# Patient Record
Sex: Female | Born: 1960 | Race: Black or African American | Hispanic: No | Marital: Married | Smoking: Former smoker
Health system: Southern US, Community
[De-identification: ages and names within clinical notes are randomized; demographics above are authoritative.]

## PROBLEM LIST (undated history)

## (undated) DIAGNOSIS — F32A Depression, unspecified: Secondary | ICD-10-CM

## (undated) DIAGNOSIS — M1990 Unspecified osteoarthritis, unspecified site: Secondary | ICD-10-CM

## (undated) DIAGNOSIS — R5383 Other fatigue: Secondary | ICD-10-CM

## (undated) DIAGNOSIS — T8859XA Other complications of anesthesia, initial encounter: Secondary | ICD-10-CM

## (undated) DIAGNOSIS — U099 Post covid-19 condition, unspecified: Secondary | ICD-10-CM

## (undated) DIAGNOSIS — G4730 Sleep apnea, unspecified: Secondary | ICD-10-CM

## (undated) DIAGNOSIS — Z8619 Personal history of other infectious and parasitic diseases: Secondary | ICD-10-CM

## (undated) DIAGNOSIS — Z87898 Personal history of other specified conditions: Secondary | ICD-10-CM

## (undated) DIAGNOSIS — G4710 Hypersomnia, unspecified: Secondary | ICD-10-CM

## (undated) DIAGNOSIS — T4145XA Adverse effect of unspecified anesthetic, initial encounter: Secondary | ICD-10-CM

## (undated) DIAGNOSIS — R079 Chest pain, unspecified: Secondary | ICD-10-CM

## (undated) DIAGNOSIS — M722 Plantar fascial fibromatosis: Secondary | ICD-10-CM

## (undated) DIAGNOSIS — B009 Herpesviral infection, unspecified: Secondary | ICD-10-CM

## (undated) DIAGNOSIS — K219 Gastro-esophageal reflux disease without esophagitis: Secondary | ICD-10-CM

## (undated) DIAGNOSIS — G43909 Migraine, unspecified, not intractable, without status migrainosus: Secondary | ICD-10-CM

## (undated) DIAGNOSIS — R112 Nausea with vomiting, unspecified: Secondary | ICD-10-CM

## (undated) DIAGNOSIS — Z8742 Personal history of other diseases of the female genital tract: Secondary | ICD-10-CM

## (undated) DIAGNOSIS — I10 Essential (primary) hypertension: Secondary | ICD-10-CM

## (undated) DIAGNOSIS — F419 Anxiety disorder, unspecified: Secondary | ICD-10-CM

## (undated) DIAGNOSIS — Z9889 Other specified postprocedural states: Secondary | ICD-10-CM

## (undated) DIAGNOSIS — Z72820 Sleep deprivation: Secondary | ICD-10-CM

## (undated) DIAGNOSIS — E119 Type 2 diabetes mellitus without complications: Secondary | ICD-10-CM

## (undated) DIAGNOSIS — N890 Mild vaginal dysplasia: Secondary | ICD-10-CM

## (undated) DIAGNOSIS — E785 Hyperlipidemia, unspecified: Secondary | ICD-10-CM

## (undated) DIAGNOSIS — F329 Major depressive disorder, single episode, unspecified: Secondary | ICD-10-CM

## (undated) DIAGNOSIS — G2589 Other specified extrapyramidal and movement disorders: Secondary | ICD-10-CM

## (undated) DIAGNOSIS — D332 Benign neoplasm of brain, unspecified: Secondary | ICD-10-CM

## (undated) HISTORY — DX: Sleep apnea, unspecified: G47.30

## (undated) HISTORY — DX: Herpesviral infection, unspecified: B00.9

## (undated) HISTORY — PX: DILATION AND CURETTAGE OF UTERUS: SHX78

## (undated) HISTORY — DX: Gastro-esophageal reflux disease without esophagitis: K21.9

## (undated) HISTORY — DX: Plantar fascial fibromatosis: M72.2

## (undated) HISTORY — DX: Personal history of other infectious and parasitic diseases: Z86.19

## (undated) HISTORY — DX: Mild vaginal dysplasia: N89.0

## (undated) HISTORY — DX: Hyperlipidemia, unspecified: E78.5

## (undated) HISTORY — DX: Sleep deprivation: Z72.820

## (undated) HISTORY — DX: Post covid-19 condition, unspecified: U09.9

## (undated) HISTORY — DX: Type 2 diabetes mellitus without complications: E11.9

## (undated) HISTORY — DX: Anxiety disorder, unspecified: F41.9

## (undated) HISTORY — PX: TUBAL LIGATION: SHX77

## (undated) HISTORY — DX: Other fatigue: R53.83

## (undated) HISTORY — DX: Benign neoplasm of brain, unspecified: D33.2

## (undated) HISTORY — DX: Unspecified osteoarthritis, unspecified site: M19.90

## (undated) HISTORY — DX: Other specified extrapyramidal and movement disorders: G25.89

## (undated) HISTORY — DX: Personal history of other diseases of the female genital tract: Z87.42

## (undated) HISTORY — DX: Personal history of other specified conditions: Z87.898

## (undated) HISTORY — DX: Migraine, unspecified, not intractable, without status migrainosus: G43.909

## (undated) HISTORY — PX: ESOPHAGOGASTRODUODENOSCOPY: SHX1529

## (undated) HISTORY — PX: ABDOMINAL HYSTERECTOMY: SHX81

## (undated) HISTORY — PX: COLONOSCOPY: SHX174

## (undated) HISTORY — DX: Chest pain, unspecified: R07.9

## (undated) HISTORY — DX: Hypersomnia, unspecified: G47.10

---
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
## 2000-01-28 ENCOUNTER — Emergency Department (HOSPITAL_COMMUNITY): Admission: EM | Admit: 2000-01-28 | Discharge: 2000-01-28 | Payer: Self-pay | Admitting: Emergency Medicine

## 2000-12-02 ENCOUNTER — Ambulatory Visit: Admission: RE | Admit: 2000-12-02 | Discharge: 2000-12-02 | Payer: Self-pay | Admitting: Family Medicine

## 2001-01-17 ENCOUNTER — Encounter: Payer: Self-pay | Admitting: Family Medicine

## 2001-01-17 ENCOUNTER — Ambulatory Visit (HOSPITAL_COMMUNITY): Admission: RE | Admit: 2001-01-17 | Discharge: 2001-01-17 | Payer: Self-pay | Admitting: Family Medicine

## 2001-07-17 ENCOUNTER — Encounter: Payer: Self-pay | Admitting: Family Medicine

## 2001-07-17 ENCOUNTER — Ambulatory Visit (HOSPITAL_COMMUNITY): Admission: RE | Admit: 2001-07-17 | Discharge: 2001-07-17 | Payer: Self-pay | Admitting: Family Medicine

## 2001-09-03 ENCOUNTER — Encounter: Payer: Self-pay | Admitting: Family Medicine

## 2001-09-03 ENCOUNTER — Ambulatory Visit (HOSPITAL_COMMUNITY): Admission: RE | Admit: 2001-09-03 | Discharge: 2001-09-03 | Payer: Self-pay | Admitting: Family Medicine

## 2002-01-22 ENCOUNTER — Ambulatory Visit (HOSPITAL_COMMUNITY): Admission: RE | Admit: 2002-01-22 | Discharge: 2002-01-22 | Payer: Self-pay | Admitting: Family Medicine

## 2002-01-22 ENCOUNTER — Encounter: Payer: Self-pay | Admitting: General Surgery

## 2002-01-22 ENCOUNTER — Encounter: Payer: Self-pay | Admitting: Family Medicine

## 2002-01-23 ENCOUNTER — Inpatient Hospital Stay (HOSPITAL_COMMUNITY): Admission: EM | Admit: 2002-01-23 | Discharge: 2002-01-24 | Payer: Self-pay | Admitting: Emergency Medicine

## 2002-01-23 ENCOUNTER — Encounter: Payer: Self-pay | Admitting: Family Medicine

## 2002-02-20 ENCOUNTER — Ambulatory Visit (HOSPITAL_COMMUNITY): Admission: RE | Admit: 2002-02-20 | Discharge: 2002-02-20 | Payer: Self-pay | Admitting: Internal Medicine

## 2002-04-09 ENCOUNTER — Ambulatory Visit (HOSPITAL_COMMUNITY): Admission: RE | Admit: 2002-04-09 | Discharge: 2002-04-09 | Payer: Self-pay | Admitting: Family Medicine

## 2002-04-09 ENCOUNTER — Encounter: Payer: Self-pay | Admitting: Family Medicine

## 2002-12-03 ENCOUNTER — Encounter: Payer: Self-pay | Admitting: Family Medicine

## 2002-12-03 ENCOUNTER — Ambulatory Visit (HOSPITAL_COMMUNITY): Admission: RE | Admit: 2002-12-03 | Discharge: 2002-12-03 | Payer: Self-pay | Admitting: Family Medicine

## 2003-04-06 ENCOUNTER — Encounter: Admission: RE | Admit: 2003-04-06 | Discharge: 2003-04-06 | Payer: Self-pay | Admitting: Orthopedic Surgery

## 2005-06-09 ENCOUNTER — Emergency Department (HOSPITAL_COMMUNITY): Admission: EM | Admit: 2005-06-09 | Discharge: 2005-06-09 | Payer: Self-pay | Admitting: Emergency Medicine

## 2006-03-25 ENCOUNTER — Ambulatory Visit (HOSPITAL_COMMUNITY): Admission: RE | Admit: 2006-03-25 | Discharge: 2006-03-25 | Payer: Self-pay | Admitting: Family Medicine

## 2006-06-05 ENCOUNTER — Ambulatory Visit (HOSPITAL_COMMUNITY): Admission: RE | Admit: 2006-06-05 | Discharge: 2006-06-05 | Payer: Self-pay | Admitting: Family Medicine

## 2006-06-07 ENCOUNTER — Ambulatory Visit (HOSPITAL_COMMUNITY): Admission: RE | Admit: 2006-06-07 | Discharge: 2006-06-07 | Payer: Self-pay | Admitting: Family Medicine

## 2006-06-25 ENCOUNTER — Ambulatory Visit: Payer: Self-pay | Admitting: Internal Medicine

## 2006-07-03 ENCOUNTER — Encounter: Admission: RE | Admit: 2006-07-03 | Discharge: 2006-07-03 | Payer: Self-pay | Admitting: Internal Medicine

## 2006-09-10 ENCOUNTER — Encounter (INDEPENDENT_AMBULATORY_CARE_PROVIDER_SITE_OTHER): Payer: Self-pay | Admitting: Obstetrics and Gynecology

## 2006-09-10 ENCOUNTER — Ambulatory Visit (HOSPITAL_COMMUNITY): Admission: RE | Admit: 2006-09-10 | Discharge: 2006-09-10 | Payer: Self-pay | Admitting: Obstetrics and Gynecology

## 2007-03-20 HISTORY — PX: OOPHORECTOMY: SHX86

## 2008-08-23 ENCOUNTER — Emergency Department (HOSPITAL_COMMUNITY): Admission: EM | Admit: 2008-08-23 | Discharge: 2008-08-23 | Payer: Self-pay | Admitting: Emergency Medicine

## 2008-09-10 ENCOUNTER — Ambulatory Visit: Payer: Self-pay | Admitting: Cardiology

## 2008-09-10 ENCOUNTER — Encounter (HOSPITAL_COMMUNITY): Admission: RE | Admit: 2008-09-10 | Discharge: 2008-10-10 | Payer: Self-pay | Admitting: Internal Medicine

## 2008-10-04 DIAGNOSIS — G43909 Migraine, unspecified, not intractable, without status migrainosus: Secondary | ICD-10-CM | POA: Insufficient documentation

## 2008-10-04 DIAGNOSIS — R079 Chest pain, unspecified: Secondary | ICD-10-CM | POA: Insufficient documentation

## 2010-04-09 ENCOUNTER — Encounter: Payer: Self-pay | Admitting: Family Medicine

## 2010-06-26 LAB — DIFFERENTIAL
Basophils Absolute: 0.1 10*3/uL (ref 0.0–0.1)
Basophils Relative: 1 % (ref 0–1)
Eosinophils Absolute: 0.1 10*3/uL (ref 0.0–0.7)
Eosinophils Relative: 2 % (ref 0–5)
Lymphocytes Relative: 41 % (ref 12–46)
Lymphs Abs: 2.3 10*3/uL (ref 0.7–4.0)
Monocytes Absolute: 0.1 10*3/uL (ref 0.1–1.0)
Monocytes Relative: 2 % — ABNORMAL LOW (ref 3–12)
Neutro Abs: 3 10*3/uL (ref 1.7–7.7)
Neutrophils Relative %: 53 % (ref 43–77)

## 2010-06-26 LAB — BASIC METABOLIC PANEL
BUN: 13 mg/dL (ref 6–23)
CO2: 27 mEq/L (ref 19–32)
Calcium: 9.4 mg/dL (ref 8.4–10.5)
Chloride: 109 mEq/L (ref 96–112)
Creatinine, Ser: 0.82 mg/dL (ref 0.4–1.2)
GFR calc Af Amer: >60 mL/min (ref >60)
GFR calc non Af Amer: >60 mL/min (ref >60)
Glucose, Bld: 98 mg/dL (ref 70–99)
Potassium: 4 mEq/L (ref 3.5–5.1)
Sodium: 142 mEq/L (ref 135–145)

## 2010-06-26 LAB — POCT I-STAT, CHEM 8
BUN: 16 mg/dL (ref 6–23)
Calcium, Ion: 1.18 mmol/L (ref 1.12–1.32)
Chloride: 107 mEq/L (ref 96–112)
Creatinine, Ser: 1 mg/dL (ref 0.4–1.2)
Glucose, Bld: 98 mg/dL (ref 70–99)
HCT: 37 % (ref 36.0–46.0)
Hemoglobin: 12.6 g/dL (ref 12.0–15.0)
Potassium: 3.9 mEq/L (ref 3.5–5.1)
Sodium: 139 mEq/L (ref 135–145)
TCO2: 25 mmol/L (ref 0–100)

## 2010-06-26 LAB — POCT CARDIAC MARKERS
CKMB, poc: <1 ng/mL — ABNORMAL LOW (ref 1.0–8.0)
Myoglobin, poc: 39.5 ng/mL (ref 12–200)
Troponin i, poc: <0.05 ng/mL (ref 0.00–0.09)

## 2010-06-26 LAB — CBC
HCT: 35.1 % — ABNORMAL LOW (ref 36.0–46.0)
Hemoglobin: 11.9 g/dL — ABNORMAL LOW (ref 12.0–15.0)
MCHC: 34.1 g/dL (ref 30.0–36.0)
MCV: 86.4 fL (ref 78.0–100.0)
Platelets: 331 10*3/uL (ref 150–400)
RBC: 4.06 MIL/uL (ref 3.87–5.11)
RDW: 14.5 % (ref 11.5–15.5)
WBC: 5.7 10*3/uL (ref 4.0–10.5)

## 2010-06-26 LAB — POCT PREGNANCY, URINE: Preg Test, Ur: NEGATIVE

## 2010-08-04 NOTE — H&P (Signed)
[name], [name]              ACCOUNT NO.:  [id number]   MEDICAL RECORD NO.:  [id number]          PATIENT TYPE:  AMB   LOCATION:  SDC                           FACILITY:  [facility]   PHYSICIAN:  Randye Lobo, M.D.   DATE OF BIRTH:  05-01-1960   DATE OF ADMISSION:  09/06/2006  DATE OF DISCHARGE:                              HISTORY & PHYSICAL   CHIEF COMPLAINT:  Pelvic abdominal  pain and dyspareunia.   HISTORY OF PRESENT ILLNESS:  The patient is a 50 year old gravida 3,  para 2-0-1-2 African-American female, status post total vaginal  hysterectomy 15 years prior for endometriosis, who is referred to the  office by Dr. Assunta Found for evaluation of abdominal and pelvic pain.  The patient reports a long history of pain which has prompted multiple  CT scans and ultrasounds documenting waxing and waning ovarian cysts.  The patient had a CT scan of the abdomen and pelvis at [facility] on June 05, 2006, which documented several right ovarian cysts  measuring 1.5 to 2 cm in size.  The patient was noted to have a moderate  amount of pelvic fluid at that time.  The remainder of the abdominal  CT  was remarkable only for some small hepatic cysts.  A follow-up pelvic  ultrasound performed June 07, 2006 documented a right ovary measuring  3.5 cm in it's largest diameter with the presence of 2 collapsing  follicles, the largest of which measured 2.2 cm.  The left ovary was not  visualized.   The patient reports painful intercourse, as she currently declines any  oral contraceptives therapy for potential treatment of her pelvic pain  and her ovarian cysts.  The patient desires surgery for ovarian removal,  due to her longstanding history of ovarian cysts, and due to the pain  she is experiencing.   PAST OBSTETRIC AND GYNECOLOGIC HISTORY:  Remarkable for 2 prior vaginal  deliveries and 1 voluntary interruption of pregnancy.  The patient has  recently initiated hormone therapy with  Vivelle Dot patches and Vagifem  vaginal estrogen tablets.  Her last Pap smear was performed in September  of 2007 and was within normal limits.  Her last mammogram was performed  approximately 1 year ago and was within normal limits.   PAST MEDICAL HISTORY:  Significant for a recent diagnosis of adult onset  diabetes mellitus which is diet controlled.   PAST SURGICAL HISTORY:  Significant for:  1. Status post D&C x4 for abnormal vaginal bleeding.  2. Status post laparoscopy with a diagnosis of endometriosis followed      by total vaginal hysterectomy.   MEDICATIONS:  1. Vivelle Dot 0.05 mg to skin 2 times per week.  2. Vagifem 25 mcg, 1 per vagina three times per week.   ALLERGIES:  SULFA.   FAMILY HISTORY:  The patient denies a history of breast, ovarian,  uterine, or colon cancer in her family.   REVIEW OF SYSTEMS:  The patient denies any problems with urinary  incontinence.   PHYSICAL EXAMINATION:  VITAL SIGNS:  Height is 5 feet 5-1/2 inches.  The  weight is 200 pounds.  Blood pressure is 120/67.  HEENT:  Normocephalic, atraumatic.  LUNGS:  Clear to auscultation bilaterally.  HEART:  S1, S2 with regular rate and rhythm.  No evidence of a murmur,  rub or gallop.  ABDOMEN:  There are 2 infraumbilical incisions without evidence of  herniation.  The abdomen is soft and nontender and without evidence of  hepatosplenomegaly nor organomegaly.  PELVIC:  Normal external genitalia and urethra.  The vagina demonstrates  no lesions.  The cervix is absent.  There are no midline nor adnexal  masses, but there is evidence of tenderness throughout the bilateral  adnexal regions and the vaginal apex.   IMPRESSION:  The patient is a 50 year old para 2 African-American  female, status post total vaginal hysterectomy for endometriosis, who  presents with abdominal and pelvic pain and dyspareunia and a history of  recurrent ovarian cysts.  The patient is currently experiencing   perimenopausal symptoms.   PLAN:  The patient will undergo a laparoscopy with bilateral salpingo-  oophorectomy and possible lysis of adhesions.  Risks, benefits, and  alternatives have been discussed with the patient who wishes to proceed.      Randye Lobo, M.D.  Electronically Signed     BES/MEDQ  D:  09/09/2006  T:  09/09/2006  Job:  [id number]

## 2010-08-04 NOTE — Op Note (Signed)
[name], [name]              ACCOUNT NO.:  [id number]   MEDICAL RECORD NO.:  [id number]          PATIENT TYPE:  AMB   LOCATION:  SDC                           F[facility]ILITY:  [facility]   PHYSICIAN:  Randye Lobo, M.D.   DATE OF BIRTH:  09/12/1960   DATE OF PROCEDURE:  09/10/2006  DATE OF DISCHARGE:                               OPERATIVE REPORT   PREOPERATIVE DIAGNOSIS:  1. Pelvic and abdominal pain.  2. Dyspareunia.  3. Adnexal cysts.   POSTOPERATIVE DIAGNOSIS:  1. Pelvic abdominal pain.  2. Dyspareunia.  3. Peritoneal cyst.   PROCEDURE:  Is laparoscopic bilateral salpingo-oophorectomy, lysis of  adhesions, excision of peritoneal inclusion cysts.   SURGEON:  Conley Simmonds, M.D.   ASSISTANT:  Lodema Hong, M.D.   ANESTHESIA:  General endotracheal, local with 0.25% Marcaine.   IV FLUIDS:  1600 mL Ringer's lactate.   ESTIMATED BLOOD LOSS:  Minimal.   URINE OUTPUT:  300 mL   COMPLICATIONS:  None.   INDICATIONS FOR PROCEDURE:  The patient is a 50 year old gravida 3, para  2-0-1-2 African American female, status post total vaginal hysterectomy  for endometriosis, who was referred by Dr. Assunta Found for evaluation  and treatment of the abdominal and pelvic pain and ovarian cysts.  The  patient had a long history of pain which has prompted multiple CT scans  and ultrasounds documenting adnexal cysts.  The patient's most recent  ultrasound in March 2008 documented a right ovary measuring 3.5 cm with  the presence of two collapsing follicles, the largest of which measured  2.2 cm.  The left ovary was not visualized at that time.  The patient  also reported painful intercourse and she declined any oral  contraceptive therapy.  A plan is made now to proceed with a laparoscopy  with bilateral salpingo-oophorectomy and possible lysis of adhesions  after risks, benefits, and alternatives have been discussed.   FINDINGS:  Laparoscopy demonstrated an absent uterus.  There was a  normal-appearing left tube and ovary.  The right tube and ovary also  appeared to be normal but had a right broad ligament cyst adjacent to  them, and this measured 2.0 cm.  There was a 1.0 cm vaginal cuff cyst  and there were some adhesions noted at the same location where the  peritoneum was adherent to itself.  This was lysed and removed.  The  left cul-de-sac also demonstrated the peritoneal inclusion cyst which  measured approximately 3 cm in diameter.  This was incised to release  the fluid from the pseudocyst.  There were also some minor adhesions  along the right pelvic sidewall which was not distorting any anatomy.  The appendix, liver, and peritoneal surfaces all appeared to be  unremarkable.  The was no evidence of any endometriosis.  There was no  evidence of any adhesive disease in the upper abdomen.   SPECIMENS:  The bilateral tubes and ovaries were sent to pathology  together.  The two peritoneal cyst (one from the vaginal cuff and the  one in the right broad ligament) were sent to pathology together.  PROCEDURE:  The patient was reidentified in the preoperative hold area.  She received Ancef 1 gram IV for antibiotic prophylaxis.  She received  TED hose and PAS stockings for DVT prophylaxis.  In the operating room,  general endotracheal anesthesia was induced and the patient was then  placed in the dorsal lithotomy position.  The abdomen and vagina were  then sterilely prepped and a Foley catheter was placed inside the  bladder.  She was sterilely draped.   The procedure began by creating a 1 cm vertical umbilical incision with  a scalpel.  The incision was carried down to the fascia using an Allis  clamp.  A 10-mm trocar was inserted directly into the peritoneal cavity  and the laparoscope confirmed proper placement.  A pneumoperitoneum was  achieved with CO2 gas and the patient was then placed in the  Trendelenburg position.  A 5 mm incision was placed in each the  right  and the left lower quadrant, and 5 mm trocars were placed under direct  visualization of the laparoscope.  An inspection of the pelvic and  abdominal organs was performed and the findings are as noted above.   The procedure began with identification of the left ureter.  The left  infundibulopelvic ligament was then grasped with the tripolar instrument  and was cauterized and then incised.  The dissection continued through  the broad ligament using the same instrument for cautery and cutting.  The specimen was completely freed and was placed in the cul-de-sac.  Some additional hemostasis was required along the infundibulopelvic  ligament and this was performed with the tripolar instrument after the  ureter was again reidentified.  Hemostasis was then good.  The same  procedure that was performed on the left-hand side was then repeated on  the right-hand side and again the right tube and ovary were placed in  the posterior cul-de-sac.   The vaginal peritoneal cyst was then grasped with a grasper and was then  excised again using the tripolar instrument.  The specimen was  immediately removed and was set aside as a pathologic specimen.   Last the right broad ligament cyst was grasped and again the tripolar  instrument was used to cauterize and cut the specimen from the  surrounding broad ligament.  Care was taken to avoid any major blood  vessels, ureter, or the bladder.  The specimen was incised at this time  to let the peritoneal inclusion cyst fluid out and the specimen could  then be removed through the 5-mm trocar and was sent to pathology along  with the other peritoneal cysts.   The left lower quadrant 5 mm incision was then expanded to accommodate a  10-mm trocar which was placed without difficulty.  The EndoCatch bag was  then placed and the ovaries were removed through the left lower quadrant  incision and were sent to pathology.   The pneumoperitoneum was re-achieved  and the pelvis was irrigated and  suctioned.  Hemostasis was noted to be good along all of the operative  sites.  The small adhesions which were noted at the top of the vaginal  cuff were incised and hemostasis was good.   This concluded the patient's procedure.  There were no complications  with the procedure.   The lower abdominal trocars were removed under visualization of the  laparoscope and the pneumoperitoneum was completely released.  The  trocar in the umbilicus and the laparoscope were removed simultaneously.   The left lower  quadrant fascial incision was closed with a simple suture  of 0 Vicryl.  All of the skin incisions were closed with subcuticular  sutures of 3-0 plain.  The skin incisions were injected with 0.25%  Marcaine and the incisions were then covered with Dermabond.   The Foley catheter was removed from the vagina and also the sponge on a  stick which had been placed at the beginning of the procedure.   The patient was awakened, extubated, and escorted to the recovery room  in stable and awake condition.  There were no complications to the  procedure.  All needle, instrument, sponge counts were correct.      Randye Lobo, M.D.  Electronically Signed     BES/MEDQ  D:  09/10/2006  T:  09/10/2006  Job:  [id number]   cc:   Corrie Mckusick, M.D.  Fax: [phone]

## 2010-08-04 NOTE — Assessment & Plan Note (Signed)
[facility]                         GASTROENTEROLOGY OFFICE NOTE   NAME:[name], [name]                     MRN:          [id number]  DATE:06/25/2006                            DOB:          01-04-1961    REASON FOR CONSULTATION:  Chronic abdominal pain.   ASSESSMENT:  A 50 year old [place] American woman with chronic left  flank, left upper quadrant and left lower quadrant pain since at least  2003.   At this point, I think this could be a radicular problem from thoracic  spine versus some other type of chronic pain or costochondritis  syndrome.  She is tender on her ribs and in the abdominal area.  There  is CDA tenderness.  She does not seem to have any urinary symptoms.  I  think kidney stone problems since 2003, especially since she has had CT  scanning,etc, seem to be pretty unlikely.   RECOMMENDATIONS/PLAN:  1. MRI of the thoracic spine.  2. If that is negative, I would likely refer her to a pain clinic.  If      it is positive, would refer appropriately.  I have explained all      this to the patient and her husband.  3. Consider an upper endoscopy.  I think the yield is very low.  She      has some heartburn symptoms.  She would like an answer for her      problems, but I told her we may not be able to find a test      abnormality to explain her signs or symptoms at this time.   HISTORY OF PRESENT ILLNESS:  A 50 year old African American woman who  has had problems at least since 2003 with pain.  She describes a sharp,  stabbing, left flank pain that radiates around the left lower chest  wall, left upper quadrant and left lower quadrant.  Records I have  reviewed from 2003 show at that time she had a colonoscopy with  diminutive polyp due to a thickened cecum on a CT scan.  She had similar  left flank pain at that time.  CT scanning of the abdomen and pelvis has  otherwise been unrevealing.  She has had ovarian cyst problems off and  on, but those have mainly been on the right.  She saw Dr. Romeo Apple of  Orthopedics who had a lumbar CT Myelogram without any significant  abnormality due to right lower back pain radiating into the ankle.  She  has had a negative H.pylori antigen last year.  She has had a normal CBC  and C-met in 11/2005.  She is not really on any medications at this  time.  I do not think she has been on any neuropathic-type medications.  CBC 06/04/2006 normal, C-met normal at that time as well.  Abdominal  pelvic CT 06/05/2006, showed ovarian cysts on the right, otherwise,  negative.  She does have small hepatic cysts.  She has not really lost  weight.  Her appetite is fine.  There is some gas and bloating.  No  significant bowel habit changes.  She has tried Set designer.  I  suspect she may have had proton pump inhibitors, but she cannot really  recall what medicines have or have not been tried for these problems.   CURRENT MEDICATIONS:  None except for Excedrin Migraine.   ALLERGIES:  SULFA.   PAST MEDICAL HISTORY:  1. Chronic migraine headaches.  2. Hysterectomy.  3. Laparoscopy.  4. Dilatation and curettage.  5. Colonoscopy with diminutive polyp (path unknown).  Otherwise, as      described above.   FAMILY HISTORY:  Positive for diabetes.   SOCIAL HISTORY:  She is married.  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  She is a Engineer, agricultural.  No alcohol, tobacco or  drugs.   REVIEW OF SYSTEMS:  See medical history for full details.  There is some  chronic back pain on the left flank and left lower back as well.  She  does not seem to have any symptoms radiating into the lower extremities.  Again, no urinary complaints.   PHYSICAL EXAMINATION:  GENERAL APPEARANCE:  A well-developed, overweight  to obese black female.  VITAL SIGNS:  Height 5 feet 5-1/2 inches, blood pressure 100/60, pulse  72.  HEENT:  Eyes:  Anicteric.  ENT:  Normal mouth and oropharynx.  NECK:   Supple.  No thyromegaly.  CHEST:  Clear.  HEART:  S1, S2.  No murmurs, rubs or gallops.  ABDOMEN:  Tender in the left lower quadrants.  It actually seems to  improve with muscle tension.  There is no mass.  MUSCULOSKELETAL:  There is left flank tenderness and CVA tenderness.  She is tender all over the inferior, posterior, lateral and anterior  aspect of her rib cage.  There is no straight leg raise sign or change  in pain with rotation of her hips on either leg.  NEUROLOGICAL:  She is alert and oriented x3.   I have reviewed the data as described above.   I appreciate the opportunity to care for this patient.   Note:  I have requested the pathology report from her previous  colonoscopy.  If she had an adenomatous polyp, she could be due for a  surveillance colonoscopy.     Iva Boop, MD,FACG  Electronically Signed    CEG/MedQ  DD: 06/25/2006  DT: 06/26/2006  Job #: [id number]   cc:   Corrie Mckusick, M.D.

## 2010-08-04 NOTE — H&P (Signed)
[name], [name]                        ACCOUNT NO.:  [id number]   MEDICAL RECORD NO.:  [id number]                   PATIENT TYPE:  INP   LOCATION:  A309                                 FACILITY:  [facility]   PHYSICIAN:  Burnice Logan, M.D.               DATE OF BIRTH:  03/13/1961   DATE OF ADMISSION:  01/22/2002  DATE OF DISCHARGE:                                HISTORY & PHYSICAL   SURGICAL CONSULTANT:  Dalia Heading, M.D.   CHIEF COMPLAINT:  Left flank pain.   HISTORY OF PRESENT ILLNESS:  A 50 year old African-American lady who  experienced sudden onset of left flank pain radiating to her groin this  afternoon while at work.  The pain was associated with nausea; she denies  any vomiting or diarrhea.  She last moved her bowels yesterday.  She was  seen in the emergency room by Dr. Lovell Sheehan who ordered a CAT scan.  Results  seemed to indicate some thickening of the cecal wall.  Dr. Lovell Sheehan called me  and asked me to admit the patient to the hospital for Dr. Nobie Putnam.  The  patient had received Demerol and Phenergan in the emergency room and she had  had some relief from the pain, but had not gone away completely.   PAST MEDICAL HISTORY:  1. Migraine headaches.  2. Chronic back pain.  3. Partial hysterectomy in 1992.   MEDICATION ALLERGIES:  SULFA - she breaks out in hives.   CURRENT MEDICATIONS:  The patient is on no regular medications.   FAMILY HISTORY:  Significant for hypertension.   SOCIAL HISTORY:  The patient is married.  She works in the [employer].  She smokes less than a half a pack of cigarettes a day and denies use of  alcohol.   REVIEW OF SYSTEMS:  GENERAL:  Denies fever or chills.  No recent weight  loss.  NEURO:  Migraine headaches but at the time asymptomatic.  CARDIORESPIRATORY:  No shortness of breath or chest pain.  Denies cough or  sputum production.  GASTROINTESTINAL:  Negative for melena, hematemesis, or  diarrhea.  GENITOURINARY:  No  dysuria or hematuria.  ENDOCRINE:  Negative  for diabetes mellitus or thyroid disease.  MUSCULOSKELETAL:  Chronic back  pain.  All other systems reviewed and negative.   PHYSICAL EXAMINATION:  GENERAL:  This is a young African-American lady lying  supine in bed, appears slightly uncomfortable because of abdominal pain.  VITAL SIGNS:  Temperature 99.1, blood pressure 108/72, heart rate 86 per  minute, respiratory rate 20 per minute.  HEENT:  Normocephalic, atraumatic.  Equal-sized pupils, reactive to light.  External nares are patent without any discharge.  Throat is unremarkable.  Tongue is moist.  NECK:  Supple.  No thyroid masses are felt.  LUNGS:  Clear to auscultation.  Breathing is unlabored.  CARDIOVASCULAR:  Heart sounds 1 and 2 are heard and regular.  ABDOMEN:  Not distended and soft to palpation.  She has tenderness with deep  palpation in the left lower quadrant.  There is no rebound tenderness; no  masses are felt.  RECTAL:  No external lesions, normal sphincter tone, empty rectal vault.  Negative stool guaiac.  NEUROLOGIC:  The patient is alert and oriented x3.  Cranial nerves II-XII  are normal.  Muscle power and strength is symmetric and equal in all  extremities.  No sensory deficits.  EXTREMITIES:  No pedal edema.   LABORATORY DATA:  CT of abdomen with and without contrast:  1. Mild thickening of lateral cecal wall; appendix not visualized.     Nonspecific focal inflammatory process not excluded.  2. A 1.7 cm right ovarian cyst with rim enhancement.  3. A 6 mm hypodensity in the liver, probably stable.   White count 6.9, hemoglobin 12, hematocrit 38, platelet count 328.  Sodium  137, potassium 3.6, BUN 6, creatinine 0.8.  Urinalysis is pending.   ASSESSMENT AND PLAN:  Left flank pain.  The patient's CT is suggestive of  nonspecific inflammation of her bowel with cecal thickening.  However, she  has no symptoms suggestive of colitis or inflammatory bowel disease  except  for her pain.  CT of abdomen is negative for mechanical bowel obstruction.  The patient will be admitted for observation.  Will require GI evaluation  for possible colonoscopy if her symptoms persist.  Will maintain her  treatments with analgesics and IV fluids.  Nephrolithiasis needs to be  excluded in this presentation and a renal sonogram will be done in the  morning.  Urinalysis is also pending and will be followed.   Further care will be deferred to Dr. Nobie Putnam.                                               Burnice Logan, M.D.    ES/MEDQ  D:  01/22/2002  T:  01/23/2002  Job:  [id number]   cc:   Patrica Duel, M.D.  [address], [address]  [place]  [place] [address]  Fax: [phone]

## 2010-08-04 NOTE — Op Note (Signed)
[name], [name]                          ACCOUNT NO.:  [id number]   MEDICAL RECORD NO.:  [id number]                   PATIENT TYPE:  PAMB   LOCATION:                                       FACILITY:  [facility]   PHYSICIAN:  R. Roetta Sessions, M.D.              DATE OF BIRTH:  02/13/2001   DATE OF PROCEDURE:  02/20/2002  DATE OF DISCHARGE:                                 OPERATIVE REPORT   PROCEDURE:  Diagnostic colonoscopy with biopsy.   ENDOSCOPIST:  Gerrit Friends. Rourk, M.D.   INDICATIONS FOR PROCEDURE:  The patient is a 50 year old lady with worsening  chronic constipation and left-sided abdominal pain.  CT demonstrated some  questionable wall thickening of the lateral aspect of the cecum.  The prep  was marginal.  There was some evidence of diverticulosis without evidence of  diverticulitis.  Colonoscopy is now being done to further evaluate her  symptoms.  This approach has been discussed with the patient at length  previously.  The potential risks, benefits, and alternatives have been  reviewed.  Questions answered.  She is agreeable.   DESCRIPTION OF PROCEDURE:  O2 saturation, blood pressure, pulse and  respirations were monitored throughout the entire procedure.   CONSCIOUS SEDATION:  Versed 3 mg IV, Demerol 50 mg IV in divided doses.   INSTRUMENT:  Olympus video chip adult colonoscope.   FINDINGS:  Digital rectal exam revealed no abnormalities.   ENDOSCOPIC FINDINGS:  The prep was good.   RECTUM:  Examination of the rectal mucosa including the retroflex view of  the anal verge revealed no abnormalities.   COLON:  The colonic mucosa was surveyed from the rectosigmoid junction  through the left transverse and right colon to the area of the appendiceal  orifice, ileocecal valve, and cecum.  These structures were well seen and  photographed for the record.  The patient had a 3-mm polyp in the hepatic  flexure which was cold biopsied/removed.  The remainder of the colonic  mucosa appeared normal.   The terminal ileum was intubated to 10 cm.  A segment of GI tract also  appeared normal.  From this level the scope was slowly withdrawn.  All  previously mentioned mucosal surfaces were again seen; and no abnormalities  were observed.  I was unable to identify any evidence of diverticular  disease or other colonic mucosal abnormalities aside from that noted above.  The patient tolerated the procedure well and was reacted in endoscopy.   IMPRESSION:  1. Normal rectum.  2. Diminutive polyp at that hepatic flexure cold biopsied/removed.  3. The remainder of the colonic mucosa appeared normal.  4. Cecum appeared normal.  5. Terminal ileum appeared normal.   RECOMMENDATIONS:  1. Continue MiraLax 17 mg orally daily for constipation.  2. Add Benefiber 1 tablespoon b.i.d.  3. Follow up on path.  4. Appointment to see [place] back in the office  in 3-4 weeks.                                               Jonathon Bellows, M.D.    RMR/MEDQ  D:  02/20/2002  T:  02/20/2002  Job:  [id number]   cc:   Corrie Mckusick, M.D.  [address]  [place]  [place] [address]  Fax: [phone]

## 2010-08-04 NOTE — Consult Note (Signed)
NAME:  [name], [name]                        ACCOUNT NO.:  [id number]   MEDICAL RECORD NO.:  [id number]                   PATIENT TYPE:   LOCATION:                                       FACILITY:  [facility]   PHYSICIAN:  R. Roetta Sessions, M.D.              DATE OF BIRTH:  04/24/1960   DATE OF CONSULTATION:  01/28/2002  DATE OF DISCHARGE:                                   CONSULTATION   REASON FOR CONSULTATION:  Left lower quadrant abdominal pain.   HISTORY OF PRESENT ILLNESS:  The patient is a pleasant 50 year old black  female who presents to the office today for further evaluation of left lower  quadrant abdominal pain.  She developed acute onset of left lower quadrant  abdominal pain approximately one week ago (on Thursday).  She was seen by  her primary care physician.  Apparently, she had some abdominal films in the  office with some dilated bowel loops.  She was sent to the ER for further  evaluation.  From that point she ended up being admitted.  She stayed  overnight in the hospital for two nights.  She eventually improved with  symptomatic therapy.  She denies receiving any type of antibiotic therapy.  She says initially they thought she may have diverticulitis.  However, this  did not show on CT findings.  She was not given a clear diagnosis by her  report.   She states that the pain in the left lower quadrant region is constant.  At  times it is worse than others.  Sometimes it doubles her over.  It is  slightly better at this point.  She describes it as a sharp pain which  begins in the left mid abdomen and radiates downward into the left lower  quadrant.  She has difficulty moving her bowels.  Generally has a bowel  movement one to two times weekly.  She uses Ex-Lax or Dulcolax on a weekly  basis.  Previously she was tried on MiraLax with good response, but was only  given a one week course.  She has had some nausea related to this pain, but  no vomiting.  She has  intermittent acid reflux which has responded to PPI  therapy in the past.  Denies any dysphagia, odynophagia.  She complains of  30 pound weight gain in the last one year.  She reports having normal  thyroid blood tests previously.   While hospitalized she underwent a CT of the abdomen and pelvis with and  without contrast.  This revealed a questionable wall thickening associated  to lateral aspect of the cecum.  There was a considerable amount of stool  within the cecum making examination difficult.  She also had a 1.7 cm right  ovarian cyst and sigmoid colon diverticulosis, but no evidence of  diverticulitis.  She had a renal ultrasound which was unremarkable.  This  was done to  rule out nephrolithiasis.  She had a urinalysis which was  negative.  In May 2003 she had a CT for right lower quadrant abdominal pain.  This revealed a minimal diffuse fatty infiltration to the liver and a 6 mm  simple cyst in the right lobe of the liver.  She also had a 1.5 cm partially  collapsed right ovarian cyst, but normal appearing appendix.   CURRENT MEDICATIONS:  1. Aciphex 20 mg b.i.d.  2. Excedrin p.r.n. migraine.  Uses rarely.  3. Tylenol p.r.n.  4. Ex-Lax or Dulcolax q.weekly p.r.n. constipation.   ALLERGIES:  SULFA causes hives.   PAST MEDICAL HISTORY:  She has a history of endometriosis requiring  exploratory laparotomy in 1986 for excessive vaginal bleeding.  She has  since had a partial hysterectomy and denies any further difficulties.  She  has had D&C x4 due to irregular vaginal bleeding.   FAMILY HISTORY:  Mother has thyroid disease.  Father has early Alzheimer's  disease.  She has an uncle who has colon cancer.  He is 78.   SOCIAL HISTORY:  She has been married for one and a half years.  Has two  children.  She is employed with [employer].  She smokes six cigarettes daily  and has smoked for over 15 years.  Denies any alcohol use.   REVIEW OF SYSTEMS:  Please see HPI for GI and  general.  CARDIOPULMONARY:  Denies any chest pain or shortness of breath.  GENITOURINARY:  Complains of  urinary frequency associated with this abdominal pain.   PHYSICAL EXAMINATION:  VITAL SIGNS:  Weight 168, height 5 feet 5.5 inches,  temperature 98.2, blood pressure 110/62, pulse 64.  GENERAL:  Very pleasant, well-nourished, well-developed black female in no  acute distress.  SKIN:  Warm and dry.  No jaundice.  HEENT:  Conjunctivae are pink.  Sclerae nonicteric.  Pupils are equal,  round, and reactive to light.  Oropharyngeal mucosa moist and pink.  No  lesions, erythema, or exudate.  NECK:  No lymphadenopathy or thyromegaly, carotid bruits.  CHEST:  Lungs clear to auscultation.  CARDIAC:  Regular rate and rhythm.  Normal S1, S2.  No murmurs, rubs, or  gallops.  ABDOMEN:  Positive bowel sounds, soft, nondistended.  She has mild  tenderness in the left mid to left lower quadrant region with deep  palpation.  No rebound tenderness or guarding.  No organomegaly or masses.  RECTAL:  Deferred.  She reports this was done while in the hospital and was  Hemoccult negative.  EXTREMITIES:  No edema.   LABORATORIES:  She had a normal CBC and MET-7 while in the hospital.   IMPRESSION:  The patient is a pleasant 50 year old black female with acute  onset left lower quadrant abdominal pain which has been present now for one  week's duration.  Pain has improved, however.  Thus far evaluation has been  unremarkable except for questionable wall thickening associated with the  lateral aspect of the cecum.  Her cecum was also noted to be low lying,  extending into the pelvis interposed between the bladder and the rectum.  Given the CT findings she needs to have a colonoscopy to further evaluate  this and to rule out inflammatory bowel disease.  We may simply be dealing  with acute flare of irritable bowel syndrome with constipation.  She also has typical gastroesophageal reflux disease  unresponsive to Aciphex 20 mg  b.i.d.   PLAN:  1. Colonoscopy in the near future.  I discussed risks, alternatives, and     benefits with patient and she is agreeable to proceed.  2. Aggressively treat constipation with Avicor two tablets daily and MiraLax     17 g p.o. b.i.d. for two days, then q.d., one sample bottle given.  3. NuLev one tablet q.6h. p.r.n. abdominal pain, #18 samples given.  4.     She will continue Aciphex 20 mg b.i.d. for now, should be able to decrease     this to q.d. dosing in the near future.  5. Further recommendations to follow.   I would like to thank Dr. Phillips Odor for allowing [place] to take part in the care  of this patient.     Tana Coast, Pricilla Larsson, M.D.    LL/MEDQ  D:  01/28/2002  T:  01/28/2002  Job:  [id number]   cc:   Corrie Mckusick, M.D.  [address]  [place]  [place] [address]  Fax: [phone]

## 2010-08-04 NOTE — Consult Note (Signed)
NAME:  [name], [name]                        ACCOUNT NO.:  [id number]   MEDICAL RECORD NO.:  [id number]                   PATIENT TYPE:  INP   LOCATION:  A309                                 FACILITY:  [facility]   PHYSICIAN:  Dalia Heading, M.D.               DATE OF BIRTH:  June 18, 1960   DATE OF CONSULTATION:  01/22/2002  DATE OF DISCHARGE:                                   CONSULTATION   REASON FOR CONSULTATION:  Left-side abdominal pain.   HISTORY OF PRESENT ILLNESS:  The patient is a 50 year old black female who  presents with a 24-hour history of worsening left flank and left lower  quadrant abdominal pain.  She states it has been crampy in nature.  Her last  bowel movement was 24 hours earlier.  She denies any hematochezia or melena.  She denies any nausea or vomiting.  She states that she has had a history of  constipation in the past, though this is the first time she has had any  significant episodes of abdominal pain.  No fever or chills have been noted.   Of note is the fact that the patient is fasting currently for religious  reasons .   PAST MEDICAL HISTORY:  Questionable irritable bowel syndrome.   PAST SURGICAL HISTORY:  Vaginal hysterectomy.   CURRENT MEDICATIONS:  Unremarkable.   ALLERGIES:  SULFA.   REVIEW OF SYSTEMS:  Noncontributory.   PHYSICAL EXAMINATION:  GENERAL:  The patient is a pleasant 50 year old black  female who is well-developed and well-nourished, in no acute distress.  VITAL SIGNS:  She is afebrile and vital signs are stable.  LUNGS:  Clear to auscultation with equal breath sounds bilaterally.  HEART:  Regular rate and rhythm without S3, S4, or murmurs.  ABDOMEN:  Soft with tenderness noted along the left flank and left lower  quadrant region.  No hepatosplenomegaly, masses, or hernias are identified.  RECTAL:  Examination done previously was unremarkable.   LABORATORY DATA:  Abdominal films revealed a nonspecific bowel gas pattern  with  air distention in the colon and small intestine.  Air was noted in the  rectum.  CT scan of the abdomen and pelvis was for the most part  unremarkable.  There was a question of pericecal inflammation, which on  further review does not appear to be present.  The appendix was not seen.  A  right ovarian cyst was noted.  No left-sided abdominal symptoms were noted.   CBC and MET-7 are within normal limits.  Urinalysis is unremarkable.   IMPRESSION:  Abdominal pain, probably secondary to irritable bowel syndrome  with constipation.  Diverticulosis is also within the differential  diagnosis.   PLAN:  The patient will be admitted to the hospital for pain control and  intravenous hydration.  MiraLax will be ordered for her possible  constipation.  Further management is pending those results.  She will need a  colonoscopy as an outpatient once she is finished fasting.                                               Dalia Heading, M.D.    MAJ/MEDQ  D:  01/23/2002  T:  01/23/2002  Job:  [id number]   cc:   Madelin Rear. Sherwood Gambler, M.D.  [address]  [place]  [place] [address]  Fax: [phone]

## 2010-08-04 NOTE — Discharge Summary (Signed)
   NAME:  [name], [name]                        ACCOUNT NO.:  [id number]   MEDICAL RECORD NO.:  [id number]                   PATIENT TYPE:  INP   LOCATION:  A309                                 FACILITY:  [facility]   PHYSICIAN:  Corrie Mckusick, M.D.               DATE OF BIRTH:  [birth year]   DATE OF ADMISSION:  01/22/2002  DATE OF DISCHARGE:  01/24/2002                                 DISCHARGE SUMMARY   HISTORY OF PRESENT ILLNESS:   PAST MEDICAL HISTORY:  Please see admission H&P.   HOSPITAL COURSE:  A 50 year old black female who presented with the onset of  left flank and left periumbilical pain.  She was seen by Dr. Lovell Sheehan in the  emergency department and he felt that she should be observed for change in  progression for the abdominal pain.  A CT on admission showed mild  thickening in the lateral cecal wall with nonspecific focal inflammatory  process not to be excluded.  There was 1.7 cm right ovarian cyst.  There was  a 6 mm hypodense area in the liver which was stable.  She was admitted for  IV fluids.  She had been undergoing a fast for religious reasons.   Twenty-four hours after admission she was afebrile and improved greatly.  The pain had basically subsided and was primarily only in the periumbilical  area.  A renal ultrasound was obtained which was negative.  The patient  again remained afebrile for the next 24 hours and felt like she was ready  for discharge on the morning of 01/24/02.  She had really no further  abdominal pain and was able to tolerate a liquid diet.  She was afebrile and  vital signs were stable.   PHYSICAL EXAMINATION:  VITALS SIGNS: As stated.  GENERAL: A pleasant female in no acute distress.  CHEST: Clear to auscultation bilaterally.  CARDIOVASCULAR: Regular rate and rhythm.  No murmurs.  ABDOMEN: Bowel sounds positive.  Soft, nontender, nondistended.  No masses  palpated.  No rebound, no guarding.   DISCHARGE MEDICATIONS:  None.   CONDITION  ON DISCHARGE:  Improved and stable.   FOLLOW UP:  On Monday in the office or sooner if the pain reoccurs.  If the  pain does reoccur we will repeat the CT and get a GI consult.                                                Corrie Mckusick, M.D.    JCG/MEDQ  D:  01/24/2002  T:  01/24/2002  Job:  [id number]

## 2010-10-04 ENCOUNTER — Emergency Department (HOSPITAL_COMMUNITY): Payer: 59

## 2010-10-04 ENCOUNTER — Emergency Department (HOSPITAL_COMMUNITY)
Admission: EM | Admit: 2010-10-04 | Discharge: 2010-10-04 | Disposition: A | Discharge status: Home / Self Care | Payer: 59 | Attending: Emergency Medicine | Admitting: Emergency Medicine

## 2010-10-04 DIAGNOSIS — G8929 Other chronic pain: Secondary | ICD-10-CM | POA: Insufficient documentation

## 2010-10-04 DIAGNOSIS — M546 Pain in thoracic spine: Secondary | ICD-10-CM | POA: Insufficient documentation

## 2010-10-04 DIAGNOSIS — E119 Type 2 diabetes mellitus without complications: Secondary | ICD-10-CM | POA: Insufficient documentation

## 2010-10-04 DIAGNOSIS — F411 Generalized anxiety disorder: Secondary | ICD-10-CM | POA: Insufficient documentation

## 2010-10-04 DIAGNOSIS — R071 Chest pain on breathing: Secondary | ICD-10-CM | POA: Insufficient documentation

## 2010-10-04 LAB — CK TOTAL AND CKMB (NOT AT ARMC)
CK, MB: 1.9 ng/mL (ref 0.3–4.0)
Relative Index: 1.3 (ref 0.0–2.5)
Total CK: 145 U/L (ref 7–177)

## 2010-10-04 LAB — BASIC METABOLIC PANEL
BUN: 12 mg/dL (ref 6–23)
CO2: 26 mEq/L (ref 19–32)
Calcium: 9.4 mg/dL (ref 8.4–10.5)
Chloride: 105 mEq/L (ref 96–112)
Creatinine, Ser: 0.77 mg/dL (ref 0.50–1.10)
GFR calc Af Amer: >60 mL/min (ref >60)
GFR calc non Af Amer: >60 mL/min (ref >60)
Glucose, Bld: 104 mg/dL — ABNORMAL HIGH (ref 70–99)
Potassium: 3.8 mEq/L (ref 3.5–5.1)
Sodium: 139 mEq/L (ref 135–145)

## 2010-10-04 LAB — CBC
HCT: 34.1 % — ABNORMAL LOW (ref 36.0–46.0)
Hemoglobin: 11.8 g/dL — ABNORMAL LOW (ref 12.0–15.0)
MCH: 29.4 pg (ref 26.0–34.0)
MCHC: 34.6 g/dL (ref 30.0–36.0)
MCV: 85 fL (ref 78.0–100.0)
Platelets: 312 10*3/uL (ref 150–400)
RBC: 4.01 MIL/uL (ref 3.87–5.11)
RDW: 13.8 % (ref 11.5–15.5)
WBC: 7 10*3/uL (ref 4.0–10.5)

## 2010-10-04 LAB — DIFFERENTIAL
Basophils Absolute: 0 10*3/uL (ref 0.0–0.1)
Basophils Relative: 0 % (ref 0–1)
Eosinophils Absolute: 0.3 10*3/uL (ref 0.0–0.7)
Eosinophils Relative: 4 % (ref 0–5)
Lymphocytes Relative: 50 % — ABNORMAL HIGH (ref 12–46)
Lymphs Abs: 3.5 10*3/uL (ref 0.7–4.0)
Monocytes Absolute: 0.4 10*3/uL (ref 0.1–1.0)
Monocytes Relative: 6 % (ref 3–12)
Neutro Abs: 2.8 10*3/uL (ref 1.7–7.7)
Neutrophils Relative %: 40 % — ABNORMAL LOW (ref 43–77)

## 2010-10-04 LAB — TROPONIN I: Troponin I: <0.3 ng/mL (ref <0.30)

## 2010-10-04 LAB — D-DIMER, QUANTITATIVE (NOT AT ARMC): D-Dimer, Quant: 0.34 ug/mL-FEU (ref 0.00–0.48)

## 2010-10-30 ENCOUNTER — Other Ambulatory Visit: Payer: Self-pay | Admitting: Family Medicine

## 2010-10-30 DIAGNOSIS — R1011 Right upper quadrant pain: Secondary | ICD-10-CM

## 2010-10-31 ENCOUNTER — Ambulatory Visit
Admission: RE | Admit: 2010-10-31 | Discharge: 2010-10-31 | Disposition: A | Discharge status: Home / Self Care | Payer: 59 | Source: Ambulatory Visit | Attending: Family Medicine | Admitting: Family Medicine

## 2010-10-31 DIAGNOSIS — R1011 Right upper quadrant pain: Secondary | ICD-10-CM

## 2010-11-02 ENCOUNTER — Encounter (INDEPENDENT_AMBULATORY_CARE_PROVIDER_SITE_OTHER): Payer: Self-pay | Admitting: Surgery

## 2010-11-03 ENCOUNTER — Encounter (INDEPENDENT_AMBULATORY_CARE_PROVIDER_SITE_OTHER): Payer: Self-pay | Admitting: Surgery

## 2010-11-03 ENCOUNTER — Ambulatory Visit (INDEPENDENT_AMBULATORY_CARE_PROVIDER_SITE_OTHER): Payer: 59 | Admitting: Surgery

## 2010-11-03 VITALS — BP 118/86 | HR 58 | Temp 96.8°F | Ht 67.0 in | Wt 185.1 lb

## 2010-11-03 DIAGNOSIS — K8010 Calculus of gallbladder with chronic cholecystitis without obstruction: Secondary | ICD-10-CM

## 2010-11-03 NOTE — Patient Instructions (Signed)
Schedule surgery.

## 2010-11-03 NOTE — Progress Notes (Addendum)
Chief Complaint  Patient presents with  . Other    new pt- eval of GB stones    [name] is a 50 y.o. (DOB: 18-May-1960)  AA female who is a patient of Benita Stabile, MD and comes to me today for gall bladder disease  The patient has had symptoms of postprandial pain which was epigastric and retrosternal beginning about one year ago. She had symptoms first with leafy vegetables, then had symptoms with the latter range of foods, and now has increasingly frequent symptoms. She has tried multiple medications to control her symptoms with limited benefit.  She had a ultrasound of her abdomen 31 October 2010 which showed multiple gallstones. Labs done at Dr. Quita Skye office were okay.  She's here for further evaluation of his gallstones.  Gastrointestinal history: She has a remote history of peptic ulcer disease about 20 years ago. She has no history of liver disease, pancreatic disease or colon disease. She had a colonoscopy around 2003 because of a change in her bowel habits. She is accompanied with her husband.  Past Medical History  Diagnosis Date  . Migraine headache   . Borderline diabetic   . Weight gain   . Sleep deprivation     loss of sleep  . Fatigue   . Shortness of breath     with pain   . Nausea   . Blurred vision   . Wears glasses   . Arthritis    Review of Systems: Skin:  No history of rash.  No history of abnormal moles. Infection:  No history of hepatitis.  No history of MRSA. Neurologic:  No history of stroke.  No history of seizure.  No history of headaches. Cardiac:  No history of hypertension. No history of heart disease.  No history of prior cardiac catheterization.  No history of seeing a cardiologist. Pulmonary:  Does not smoke cigarettes.  No asthma or bronchitis.  No OSA/CPAP.  Endocrine: Borderline diabetes. No thyroid disease. Gastrointestinal:  See HPI. Urologic:  No history of kidney stones.  No history of bladder  infections. Musculoskeletal:  No history of joint or back disease. Hematologic:  No bleeding disorder.  No history of anemia.  Not anticoagulated.  Social History:  Works as Water quality scientist at [employer].  Accompanied by husband. PHYSICAL EXAM: BP 118/86  Pulse 58  Temp 96.8 F (36 C)  Ht 5\' 7"  (1.702 m)  Wt 185 lb 2 oz (83.972 kg)  BMI 28.99 kg/m2  HEENT: Normal. Pupils equal. Normal dentition. Neck: Supple. No thyroid mass. Lymph Nodes:  No supraclavicular or cervical nodes. Lungs: Clear and symmetric. Heart:  RRR. No murmur. Abdomen: No mass. No tenderness. No hernia. Normal bowel sounds.  Scars from prior laparoscopic surgery. Rectal: Not done. Extremities:  Good strength in upper and lower extremities. Neurologic:  Grossly intact to motor and sensory function.  DATA REVIEWED: Dr. Quita Skye office visit and [place] report.  ASSESSMENT and PLAN: 1.  Symptomatic gall stones.   I discussed with the patient the indications and risks of gall bladder surgery.  The primary risks of gall bladder surgery include, but are not limited to, bleeding, infection, common bile duct injury, and open surgery.  There is also the risk that the patient may have continued symptoms after surgery. I tried to answer the patient's questions. I gave the patient literature about gall bladder surgery.  2.  Borderliine DM. 3.  Remote history of Ulcer disease. 4.  MRSA positive [DN 11/08/10]

## 2010-11-08 ENCOUNTER — Encounter (INDEPENDENT_AMBULATORY_CARE_PROVIDER_SITE_OTHER): Payer: Self-pay

## 2010-11-08 ENCOUNTER — Encounter (HOSPITAL_COMMUNITY)
Admission: RE | Admit: 2010-11-08 | Discharge: 2010-11-08 | Disposition: A | Discharge status: Home / Self Care | Payer: 59 | Source: Ambulatory Visit | Attending: Surgery | Admitting: Surgery

## 2010-11-08 LAB — CBC
HCT: 35.2 % — ABNORMAL LOW (ref 36.0–46.0)
Hemoglobin: 11.8 g/dL — ABNORMAL LOW (ref 12.0–15.0)
MCH: 28.5 pg (ref 26.0–34.0)
MCHC: 33.5 g/dL (ref 30.0–36.0)
MCV: 85 fL (ref 78.0–100.0)
Platelets: 304 10*3/uL (ref 150–400)
RBC: 4.14 MIL/uL (ref 3.87–5.11)
RDW: 13.5 % (ref 11.5–15.5)
WBC: 5.7 10*3/uL (ref 4.0–10.5)

## 2010-11-08 LAB — SURGICAL PCR SCREEN
MRSA, PCR: POSITIVE — AB
Staphylococcus aureus: POSITIVE — AB

## 2010-11-08 NOTE — Progress Notes (Signed)
Quick Note:  These labs are OK for surgery. ______ 

## 2010-11-13 ENCOUNTER — Ambulatory Visit (HOSPITAL_COMMUNITY)
Admission: RE | Admit: 2010-11-13 | Discharge: 2010-11-13 | Disposition: A | Discharge status: Home / Self Care | Payer: 59 | Source: Ambulatory Visit | Attending: Surgery | Admitting: Surgery

## 2010-11-13 ENCOUNTER — Ambulatory Visit (HOSPITAL_COMMUNITY)
Admission: RE | Admit: 2010-11-13 | Discharge: 2010-11-14 | Disposition: A | Discharge status: Home / Self Care | Payer: 59 | Source: Ambulatory Visit | Attending: Surgery | Admitting: Surgery

## 2010-11-13 ENCOUNTER — Other Ambulatory Visit (INDEPENDENT_AMBULATORY_CARE_PROVIDER_SITE_OTHER): Payer: Self-pay | Admitting: Surgery

## 2010-11-13 DIAGNOSIS — K589 Irritable bowel syndrome without diarrhea: Secondary | ICD-10-CM | POA: Insufficient documentation

## 2010-11-13 DIAGNOSIS — R52 Pain, unspecified: Secondary | ICD-10-CM

## 2010-11-13 DIAGNOSIS — K219 Gastro-esophageal reflux disease without esophagitis: Secondary | ICD-10-CM | POA: Insufficient documentation

## 2010-11-13 DIAGNOSIS — Z01818 Encounter for other preprocedural examination: Secondary | ICD-10-CM | POA: Insufficient documentation

## 2010-11-13 DIAGNOSIS — Z01812 Encounter for preprocedural laboratory examination: Secondary | ICD-10-CM | POA: Insufficient documentation

## 2010-11-13 DIAGNOSIS — M545 Low back pain, unspecified: Secondary | ICD-10-CM | POA: Insufficient documentation

## 2010-11-13 DIAGNOSIS — K8010 Calculus of gallbladder with chronic cholecystitis without obstruction: Secondary | ICD-10-CM

## 2010-11-13 DIAGNOSIS — M129 Arthropathy, unspecified: Secondary | ICD-10-CM | POA: Insufficient documentation

## 2010-11-13 HISTORY — PX: CHOLECYSTECTOMY: SHX55

## 2010-11-14 ENCOUNTER — Telehealth (INDEPENDENT_AMBULATORY_CARE_PROVIDER_SITE_OTHER): Payer: Self-pay

## 2010-11-14 NOTE — Op Note (Addendum)
NAMEVERLINDA, [name]              ACCOUNT NO.:  [id number]  MEDICAL RECORD NO.:  [id number]  LOCATION:  XRAY                         FACILITY:  [facility]  PHYSICIAN:  Sandria Bales. Ezzard Standing, M.D.  DATE OF BIRTH:  May 26, 1960  DATE OF PROCEDURE:  11/13/2010                              OPERATIVE REPORT  PREOPERATIVE DIAGNOSIS:  Cholelithiasis with chronic cholecystitis.  POSTOPERATIVE DIAGNOSIS:  Cholelithiasis with chronic cholecystitis.  PROCEDURE:  Laparoscopic cholecystectomy with intraoperative cholangiogram.  SURGEON:  Sandria Bales. Ezzard Standing, MD  FIRST ASSISTANT:  Ardeth Sportsman, MD  ANESTHESIA:  General endotracheal with approximately 30 mL of 0.25% Marcaine.  COMPLICATIONS:  None.  INDICATIONS FOR PROCEDURE:  [name] is a 50 year old African American female who is a patient of Dr. Lupe Carney who has symptomatic cholelithiasis.  I discussed with her about proceeding with cholecystectomy.  I discussed the risks which include, but are not limited to, bleeding, infection, common bile duct injury, and the possibility open surgery.  OPERATIVE NOTE:  The patient was placed in a supine position given a general endotracheal anesthetic and supervised by Dr. Bedelia Person in room #17.  Her abdomen was prepped with ChloraPrep.  She had been identified a nasal swab MRSA positive preoperatively.  She was given 2 g of Ancef.    A time-out was held and the surgical checklist run.    After prepped with ChloraPrep and sterilely draped, I made an infraumbilical incision into the abdominal cavity.  I placed a 0 degrees 10-mm laparoscope through a 12-mm Hasson trocar.  The Hasson trocar was secured with a 0 Vicryl suture.  I then placed three additional trocars with a 10-mm subxiphoid trocar, 5- mm right mid subcostal, and 5-mm lateral subcostal trocar.  A did a laparoscopic exploration and revealed right and left lobes of liver unremarkable.  The stomach was unremarkable.  The bowel that I  could see was unremarkable.  She did have adhesions around the gallbladder consistent with chronic cholecystitis.  She also had kind of a black- white thickened looked to the gallbladder wall.  The gallbladder was then aspirated to cephalad.  I dissected out and identifying a fairly large cystic artery and cystic duct.  I shot an intraoperative cholangiogram.  The intraoperative cholangiogram was shot with a cutoff taut catheter inserted through a 14-gauge Jelco into the side of the cystic duct and secured with an Endoclip.  The intraoperative cholangiogram using half- strength Hypaque solution showed free flow down to the cystic duct and at the common bile duct and into the duodenum and at the hepatic radicals.  There was no filling defect, no mass and this was thought to be a normal intraoperative cholangiogram.  The taut catheter was then removed.  The cystic duct was triply endoclipped and the cystic artery was triply endoclipped.  There were at least two other branch of the cystic artery going up to the gallbladder bed, which were clipped during the dissection.  Prior to complete division of the gallbladder bed, I re-visualized the triangle of Calot into the gallbladder bed.  There was no bleeding or bile leak.  I then placed the gallbladder EndoCatch bag delivered through the umbilicus.  I irrigated the  abdomen out with a total about 1 liter of saline, reinspected the gallbladder bed and triangle of Calot.  I closed the umbilical port with a 0 Vicryl suture, the skin of each portal with a 5-0 Vicryl suture, painted, I used about 30 mL of 0.25% Marcaine as a local anesthetic.  The patient tolerated the procedure well, was transported to the recovery room in good condition.  Sponge and needle counts were correct at the end of the case.   Sandria Bales. Ezzard Standing, M.D., FACS    DHN/MEDQ  D:  11/13/2010  T:  11/13/2010  Job:  [id number]  cc:   L. Lupe Carney, M.D.  Electronically  Signed by Ovidio Kin M.D. on 11/21/2010 09:34:25 AM

## 2010-11-14 NOTE — Telephone Encounter (Signed)
I called pt to check on pt and she is doing fine, just a little sore.  I made her a po appointment  for 9-19 at 0900.  She was informed to call with any concerns.

## 2010-11-21 ENCOUNTER — Telehealth (INDEPENDENT_AMBULATORY_CARE_PROVIDER_SITE_OTHER): Payer: Self-pay | Admitting: General Surgery

## 2010-11-21 NOTE — Telephone Encounter (Signed)
The patient contacted the office regarding pain in her back around shoulder blades with bloating, per post op protocol expressed to her she should try some mylicon or something else for gas. Pt denies fever, nausea, vomiting, she is having bowel movements, and is tolerating a low fat diet. No drainage from incision, she states she has ran the vacuum and the pain in the shoulders did start prior to that. She will try the OTC gas relief and advise [place] if she is better

## 2010-12-06 ENCOUNTER — Encounter (INDEPENDENT_AMBULATORY_CARE_PROVIDER_SITE_OTHER): Payer: Self-pay | Admitting: Surgery

## 2010-12-06 ENCOUNTER — Ambulatory Visit (INDEPENDENT_AMBULATORY_CARE_PROVIDER_SITE_OTHER): Payer: 59 | Admitting: Surgery

## 2010-12-06 VITALS — BP 114/84 | HR 88 | Temp 97.7°F | Resp 16 | Ht 65.0 in | Wt 186.0 lb

## 2010-12-06 DIAGNOSIS — Z8719 Personal history of other diseases of the digestive system: Secondary | ICD-10-CM

## 2010-12-06 NOTE — Progress Notes (Signed)
ASSESSMENT AND PLAN: 1.  S/p lap chole with IOC - 11/13/2010  Has some non descript back pain.  Also a little food intolerance.  I told her to give it about 6 to 8 weeks to see if these symptoms don't get better.  If no better, contact us or Dr. Clovis Riley.  Husband with patient.  I gave her a note to return to work 11/06/2010.  2. Borderliine DM.  3. Remote history of Ulcer disease.  4. MRSA positive [DN 11/08/10]   HISTORY OF PRESENT ILLNESS: Chief Complaint  Patient presents with  . Follow-up    PO lap chole 11/13/10    [name] is a 49 y.o. (DOB: 1960/05/24)  AA female who is a patient of Benita Stabile, MD and comes to me today for follow up gall bladder surgery.  She did okay with the surgery, but has some vague back pain.  No fever, no nausea, but some foods bother he a little.  Her husband is with her.  He had back surgery the same week she had her GB surgery.  PHYSICAL EXAM: BP 114/84  Pulse 88  Temp(Src) 97.7 F (36.5 C) (Temporal)  Resp 16  Ht 5\' 5"  (1.651 m)  Wt 186 lb (84.369 kg)  BMI 30.95 kg/m2 Abdomen:  Incisions well healed.  Normal BS.  No mass.  No obvious back abnormality.  DATA REVIEWED: Path report given to patient.

## 2011-01-03 LAB — URINALYSIS, ROUTINE W REFLEX MICROSCOPIC
Bilirubin Urine: NEGATIVE
Glucose, UA: NEGATIVE
Hgb urine dipstick: NEGATIVE
Ketones, ur: NEGATIVE
Nitrite: NEGATIVE
Protein, ur: NEGATIVE
Specific Gravity, Urine: 1.01
Urobilinogen, UA: 0.2
pH: 6.5

## 2011-01-03 LAB — BASIC METABOLIC PANEL
BUN: 6
CO2: 31
Calcium: 9.9
Chloride: 104
Creatinine, Ser: 0.85
GFR calc Af Amer: >60
GFR calc non Af Amer: >60
Glucose, Bld: 101 — ABNORMAL HIGH
Potassium: 3.7
Sodium: 139

## 2011-01-03 LAB — CBC
HCT: 38.3
Hemoglobin: 12.8
MCHC: 33.4
MCV: 88.1
Platelets: 368
RBC: 4.35
RDW: 14.2 — ABNORMAL HIGH
WBC: 7.2

## 2011-03-14 NOTE — Telephone Encounter (Signed)
Erroneous encounter

## 2011-03-20 DIAGNOSIS — N890 Mild vaginal dysplasia: Secondary | ICD-10-CM

## 2011-03-20 HISTORY — DX: Mild vaginal dysplasia: N89.0

## 2011-06-25 ENCOUNTER — Emergency Department (INDEPENDENT_AMBULATORY_CARE_PROVIDER_SITE_OTHER)
Admission: EM | Admit: 2011-06-25 | Discharge: 2011-06-25 | Disposition: A | Discharge status: Home / Self Care | Payer: 59 | Source: Home / Self Care | Attending: Family Medicine | Admitting: Family Medicine

## 2011-06-25 ENCOUNTER — Encounter (HOSPITAL_COMMUNITY): Payer: Self-pay

## 2011-06-25 DIAGNOSIS — N390 Urinary tract infection, site not specified: Secondary | ICD-10-CM

## 2011-06-25 LAB — POCT URINALYSIS DIP (DEVICE)
Bilirubin Urine: NEGATIVE
Glucose, UA: NEGATIVE mg/dL
Ketones, ur: NEGATIVE mg/dL
Nitrite: NEGATIVE
Protein, ur: NEGATIVE mg/dL
Specific Gravity, Urine: 1.015 (ref 1.005–1.030)
Urobilinogen, UA: 0.2 mg/dL (ref 0.0–1.0)
pH: 6 (ref 5.0–8.0)

## 2011-06-25 MED ORDER — FLUCONAZOLE 150 MG PO TABS: 150.0000 mg | ORAL_TABLET | Freq: Once | ORAL | Status: AC

## 2011-06-25 MED ORDER — CEPHALEXIN 500 MG PO CAPS: 500.0000 mg | ORAL_CAPSULE | Freq: Two times a day (BID) | ORAL | Status: AC

## 2011-06-25 NOTE — ED Notes (Signed)
C/o pain w urination, scant vaginal D/C, ?concern for STD?

## 2011-06-25 NOTE — ED Provider Notes (Signed)
History     CSN: [id number]  Arrival date & time 06/25/11  1159   First MD Initiated Contact with Patient 06/25/11 1255      Chief Complaint  Patient presents with  . Dysuria    (Consider location/radiation/quality/duration/timing/severity/associated sxs/prior treatment) HPI Comments: [name] presents for evaluation of dysuria, urinary frequency, and urgency since last Tuesday. She reports that she came in last Wednesday but could not stay secondary to the long wait. She had to catch a flight to [place] for her son's wedding. She reports taking some over-the-counter urinary tract medicine over the weekend with mild improvement of her symptoms. However her symptoms persist, and she also has generalized abdominal discomfort. She denies any fever, nausea, vomiting, or back pain. She denies any vaginal discharge or bleeding.  Patient is a 51 y.o. female presenting with dysuria. The history is provided by the patient.  Dysuria  This is a new problem. The current episode started more than 1 week ago. The problem occurs every urination. The problem has not changed since onset.The quality of the pain is described as burning. The pain is mild. There has been no fever. Pertinent negatives include no sweats, no nausea, no vomiting, no discharge, no frequency, no hematuria, no hesitancy and no urgency.    Past Medical History  Diagnosis Date  . Migraine headache   . Borderline diabetic   . Weight gain   . Sleep deprivation     loss of sleep  . Fatigue   . Shortness of breath     with pain   . Nausea   . Blurred vision   . Wears glasses   . Arthritis     Past Surgical History  Procedure Date  . Abdominal hysterectomy   . Oophorectomy 2009  . Cholecystectomy 11/13/10    History reviewed. No pertinent family history.  History  Substance Use Topics  . Smoking status: Former Games developer  . Smokeless tobacco: Not on file  . Alcohol Use: Yes    OB History    Grav Para Term Preterm  Abortions TAB SAB Ect Mult Living                  Review of Systems  Constitutional: Negative.   HENT: Negative.   Eyes: Negative.   Respiratory: Negative.   Cardiovascular: Negative.   Gastrointestinal: Negative.  Negative for nausea and vomiting.  Genitourinary: Positive for dysuria. Negative for hesitancy, urgency, frequency, hematuria, vaginal bleeding and vaginal discharge.  Musculoskeletal: Negative.   Skin: Negative.   Neurological: Negative.     Allergies  Sulfa antibiotics  Home Medications   Current Outpatient Rx  Name Route Sig Dispense Refill  . CYCLOBENZAPRINE HCL 10 MG PO TABS Oral Take 10 mg by mouth at bedtime as needed. Take 1/2 tablet at bedtime as needed for muscle spasm     . ESTRADIOL PO Oral Take by mouth daily.      Marland Kitchen HYDROCODONE-ACETAMINOPHEN 5-500 MG PO TABS Oral Take 1 tablet by mouth every 6 (six) hours as needed.      Marland Kitchen NABUMETONE 500 MG PO TABS Oral Take 500 mg by mouth 2 (two) times daily as needed. Take one to two tablets every 12 hours for pain not relieved by Tylenol     . PANTOPRAZOLE SODIUM 40 MG PO TBEC Oral Take 40 mg by mouth daily.     . CEPHALEXIN 500 MG PO CAPS Oral Take 1 capsule (500 mg total) by mouth 2 (two) times daily.  14 capsule 0  . FLUCONAZOLE 150 MG PO TABS Oral Take 1 tablet (150 mg total) by mouth once. Take one pill once. May repeat if symptoms persist after 3rd day. 2 tablet 0    BP 124/75  Pulse 72  Temp(Src) 98.5 F (36.9 C) (Oral)  Resp 16  SpO2 100%  Physical Exam  Nursing note and vitals reviewed. Constitutional: She is oriented to person, place, and time. She appears well-developed and well-nourished.  HENT:  Head: Normocephalic and atraumatic.  Eyes: EOM are normal.  Neck: Normal range of motion.  Pulmonary/Chest: Effort normal.  Abdominal: Soft. Normal appearance and bowel sounds are normal. There is generalized tenderness.  Musculoskeletal: Normal range of motion.  Neurological: She is alert and  oriented to person, place, and time.  Skin: Skin is warm and dry.  Psychiatric: Her behavior is normal.    ED Course  Procedures (including critical care time)  Labs Reviewed  POCT URINALYSIS DIP (DEVICE) - Abnormal; Notable for the following:    Hgb urine dipstick TRACE (*)    Leukocytes, UA SMALL (*) Biochemical Testing Only. Please order routine urinalysis from main lab if confirmatory testing is needed.   All other components within normal limits   No results found.   No diagnosis found.    MDM  Labs reviewed; rx given for cephalexin, fluconazole        Sharon Munda, MD 06/25/11 1524

## 2011-06-25 NOTE — Discharge Instructions (Signed)
Take medications as directed. Take Diflucan only if symptoms of yeast infection develop. Return to care should your symptoms not improve, or worsen in any way.

## 2011-07-19 ENCOUNTER — Other Ambulatory Visit (HOSPITAL_COMMUNITY): Payer: Self-pay | Admitting: Neurosurgery

## 2011-07-19 DIAGNOSIS — M542 Cervicalgia: Secondary | ICD-10-CM

## 2011-07-24 ENCOUNTER — Ambulatory Visit (HOSPITAL_COMMUNITY)
Admission: RE | Admit: 2011-07-24 | Discharge: 2011-07-24 | Disposition: A | Discharge status: Home / Self Care | Payer: 59 | Source: Ambulatory Visit | Attending: Neurosurgery | Admitting: Neurosurgery

## 2011-07-24 DIAGNOSIS — M5137 Other intervertebral disc degeneration, lumbosacral region: Secondary | ICD-10-CM | POA: Insufficient documentation

## 2011-07-24 DIAGNOSIS — M5020 Other cervical disc displacement, unspecified cervical region: Secondary | ICD-10-CM | POA: Insufficient documentation

## 2011-07-24 DIAGNOSIS — M542 Cervicalgia: Secondary | ICD-10-CM

## 2011-07-24 DIAGNOSIS — M5124 Other intervertebral disc displacement, thoracic region: Secondary | ICD-10-CM | POA: Insufficient documentation

## 2011-07-24 DIAGNOSIS — M51379 Other intervertebral disc degeneration, lumbosacral region without mention of lumbar back pain or lower extremity pain: Secondary | ICD-10-CM | POA: Insufficient documentation

## 2011-08-06 ENCOUNTER — Emergency Department (HOSPITAL_COMMUNITY)
Admission: EM | Admit: 2011-08-06 | Discharge: 2011-08-06 | Disposition: A | Discharge status: Home / Self Care | Payer: 59 | Source: Home / Self Care | Attending: Emergency Medicine | Admitting: Emergency Medicine

## 2011-08-06 ENCOUNTER — Encounter (HOSPITAL_COMMUNITY): Payer: Self-pay | Admitting: Emergency Medicine

## 2011-08-06 DIAGNOSIS — J029 Acute pharyngitis, unspecified: Secondary | ICD-10-CM

## 2011-08-06 DIAGNOSIS — H9209 Otalgia, unspecified ear: Secondary | ICD-10-CM

## 2011-08-06 DIAGNOSIS — R05 Cough: Secondary | ICD-10-CM

## 2011-08-06 DIAGNOSIS — R059 Cough, unspecified: Secondary | ICD-10-CM

## 2011-08-06 MED ORDER — CETIRIZINE-PSEUDOEPHEDRINE ER 5-120 MG PO TB12: 1.0000 | ORAL_TABLET | Freq: Every day | ORAL | Status: DC

## 2011-08-06 MED ORDER — ACETAMINOPHEN-CODEINE #3 300-30 MG PO TABS: 1.0000 | ORAL_TABLET | Freq: Four times a day (QID) | ORAL | Status: AC | PRN

## 2011-08-06 NOTE — ED Provider Notes (Signed)
History     CSN: [id number]  Arrival date & time 08/06/11  1017   First MD Initiated Contact with Patient 08/06/11 1045      No chief complaint on file.   (Consider location/radiation/quality/duration/timing/severity/associated sxs/prior treatment) HPI Comments: Since Saturday at have been coughing in both my ear is hurting and throbbing in my throat. Haven't had about 2 episodes of diarrhea as Saturday as well. Have had several coughing spells in the right side of my chest hurts when I take a deep breath when I move a certain way. Patient denies any shortness of breath or wheezing. She is reporting that she has had temperatures of 99.5 or 6 (which she reports as fevers for her). Patient also expressing that she woke up with a headache this morning.  Patient is a 51 y.o. female presenting with cough. The history is provided by the patient.  Cough This is a new problem. The problem has not changed since onset.There has been no fever. Associated symptoms include ear pain, headaches and sore throat. Pertinent negatives include no chills, no sweats, no shortness of breath and no wheezing. She is not a smoker. Her past medical history does not include bronchitis, COPD or asthma.    Past Medical History  Diagnosis Date  . Migraine headache   . Borderline diabetic   . Weight gain   . Sleep deprivation     loss of sleep  . Fatigue   . Shortness of breath     with pain   . Nausea   . Blurred vision   . Wears glasses   . Arthritis     Past Surgical History  Procedure Date  . Abdominal hysterectomy   . Oophorectomy 2009  . Cholecystectomy 11/13/10    No family history on file.  History  Substance Use Topics  . Smoking status: Former Games developer  . Smokeless tobacco: Not on file  . Alcohol Use: Yes    OB History    Grav Para Term Preterm Abortions TAB SAB Ect Mult Living                  Review of Systems  Constitutional: Negative for chills, activity change and fatigue.    HENT: Positive for ear pain, congestion and sore throat.   Respiratory: Positive for cough. Negative for shortness of breath and wheezing.   Gastrointestinal: Positive for diarrhea. Negative for nausea and abdominal pain.  Neurological: Positive for headaches.    Allergies  Sulfa antibiotics  Home Medications   Current Outpatient Rx  Name Route Sig Dispense Refill  . CYCLOBENZAPRINE HCL 10 MG PO TABS Oral Take 10 mg by mouth at bedtime as needed. Take 1/2 tablet at bedtime as needed for muscle spasm     . ESTRADIOL PO Oral Take by mouth daily.      Marland Kitchen HYDROCODONE-ACETAMINOPHEN 5-500 MG PO TABS Oral Take 1 tablet by mouth every 6 (six) hours as needed.      Marland Kitchen NABUMETONE 500 MG PO TABS Oral Take 500 mg by mouth 2 (two) times daily as needed. Take one to two tablets every 12 hours for pain not relieved by Tylenol     . PANTOPRAZOLE SODIUM 40 MG PO TBEC Oral Take 40 mg by mouth daily.       BP 121/74  Pulse 73  Temp(Src) 98.4 F (36.9 C) (Oral)  Resp 16  SpO2 100%  Physical Exam  Nursing note and vitals reviewed. Constitutional: She appears well-developed and well-nourished. She  is active.  Non-toxic appearance. She does not have a sickly appearance. She does not appear ill. No distress.  HENT:  Head: Normocephalic.  Eyes: Conjunctivae are normal. No scleral icterus.  Neck: No JVD present.  Cardiovascular: Normal rate.   Pulmonary/Chest: Effort normal and breath sounds normal. No respiratory distress. She has no decreased breath sounds. She has no wheezes. She has no rhonchi. She has no rales. Chest wall is not dull to percussion. She exhibits no mass. Right breast exhibits no inverted nipple.    Abdominal: Bowel sounds are normal.  Lymphadenopathy:    She has no cervical adenopathy.    ED Course  Procedures (including critical care time)  Labs Reviewed - No data to display No results found.   1. Cough   2. Sore throat   3. Otalgia       MDM  Multiple symptoms  since Saturday includes cough, sore throat bilateral ear pain with right-sided reproducible chest wall pain, and couple episodes of diarrhea. Patient looks comfortable well afebrile normal respiratory exam and moderate pain with superficial palpation on the right anterior chest wall. Normal breath sounds. Patient also with mild pharyngitis. Normal ear exam        Jimmie Molly, MD 08/06/11 1252

## 2011-08-06 NOTE — ED Notes (Signed)
PT HERE WITH COLD/URI SX COUGH,CONGESTION AND FLU LIKE SX THAT STARTED 2 DYS AGO.PT HAS BEEN TAKING OTC TYLENOL AND COLD MEDS BUT NOT WORKING

## 2011-08-06 NOTE — Discharge Instructions (Signed)
  Your symptoms and exam was consistent with possibly chest wall pain and a viral infection. You can take this medicines for your symptoms if worsening symptoms as a shortness of breath or chest pains dizziness should go to the emergency department for further evaluation.    Cough, Adult  A cough is a reflex that helps clear your throat and airways. It can help heal the body or may be a reaction to an irritated airway. A cough may only last 2 or 3 weeks (acute) or may last more than 8 weeks (chronic).  CAUSES Acute cough:  Viral or bacterial infections.  Chronic cough:  Infections.   Allergies.   Asthma.   Post-nasal drip.   Smoking.   Heartburn or acid reflux.   Some medicines.   Chronic lung problems (COPD).   Cancer.  SYMPTOMS   Cough.   Fever.   Chest pain.   Increased breathing rate.   High-pitched whistling sound when breathing (wheezing).   Colored mucus that you cough up (sputum).  TREATMENT   A bacterial cough may be treated with antibiotic medicine.   A viral cough must run its course and will not respond to antibiotics.   Your caregiver may recommend other treatments if you have a chronic cough.  HOME CARE INSTRUCTIONS   Only take over-the-counter or prescription medicines for pain, discomfort, or fever as directed by your caregiver. Use cough suppressants only as directed by your caregiver.   Use a cold steam vaporizer or humidifier in your bedroom or home to help loosen secretions.   Sleep in a semi-upright position if your cough is worse at night.   Rest as needed.   Stop smoking if you smoke.  SEEK IMMEDIATE MEDICAL CARE IF:   You have pus in your sputum.   Your cough starts to worsen.   You cannot control your cough with suppressants and are losing sleep.   You begin coughing up blood.   You have difficulty breathing.   You develop pain which is getting worse or is uncontrolled with medicine.   You have a fever.  MAKE SURE  YOU:   Understand these instructions.   Will watch your condition.   Will get help right away if you are not doing well or get worse.  Document Released: 09/01/2010 Document Revised: 02/22/2011 Document Reviewed: 09/01/2010 [facility] Patient Information 2012 [place], [place].

## 2011-08-14 ENCOUNTER — Emergency Department (INDEPENDENT_AMBULATORY_CARE_PROVIDER_SITE_OTHER): Payer: 59

## 2011-08-14 ENCOUNTER — Encounter (HOSPITAL_COMMUNITY): Payer: Self-pay | Admitting: Emergency Medicine

## 2011-08-14 ENCOUNTER — Emergency Department (HOSPITAL_COMMUNITY)
Admission: EM | Admit: 2011-08-14 | Discharge: 2011-08-14 | Disposition: A | Discharge status: Home / Self Care | Payer: 59 | Source: Home / Self Care | Attending: Emergency Medicine | Admitting: Emergency Medicine

## 2011-08-14 DIAGNOSIS — R05 Cough: Secondary | ICD-10-CM

## 2011-08-14 DIAGNOSIS — R059 Cough, unspecified: Secondary | ICD-10-CM

## 2011-08-14 MED ORDER — TOBRAMYCIN-DEXAMETHASONE 0.3-0.1 % OP SUSP: 1.0000 [drp] | OPHTHALMIC | Status: AC

## 2011-08-14 MED ORDER — PREDNISONE 20 MG PO TABS: 40.0000 mg | ORAL_TABLET | Freq: Every day | ORAL | Status: AC

## 2011-08-14 NOTE — ED Provider Notes (Addendum)
History     CSN: [id number]  Arrival date & time 08/14/11  1003   First MD Initiated Contact with Patient 08/14/11 1016      Chief Complaint  Patient presents with  . Cough    (Consider location/radiation/quality/duration/timing/severity/associated sxs/prior treatment) HPI Comments: Patient returns today after her last visit with [place] describing that her cough is not getting any better and she's coughing a lot at night mainly. Describes it for the most is a dry cough but she feels her chest is congested. She also describes her throat is still bothering her but that has somewhat improved in comparison with last week and also describes that her right-sided chest pain has also improved his no longer hurting when she's taking a deep breath or coughing. She describes she's been taking the medicines it is has been prescribed to her date she feels they're not working as the cough is still bothering her.  Patient also describes that she woke up with a left sided pink eye is itchy and sticky with colored mucousy discharge.  Patient is a 51 y.o. female presenting with cough. The history is provided by the patient. No language interpreter was used.  Cough This is a new problem. The current episode started more than 1 week ago. The problem occurs constantly. The problem has not changed since onset.The cough is non-productive. There has been no fever. Associated symptoms include eye redness. Pertinent negatives include no chest pain, no chills, no sweats, no ear congestion, no headaches, no rhinorrhea, no myalgias, no shortness of breath and no wheezing. She has tried nothing for the symptoms. She is not a smoker. Her past medical history does not include pneumonia or asthma.    Past Medical History  Diagnosis Date  . Migraine headache   . Borderline diabetic   . Weight gain   . Sleep deprivation     loss of sleep  . Fatigue   . Shortness of breath     with pain   . Nausea   . Blurred vision   .  Wears glasses   . Arthritis     Past Surgical History  Procedure Date  . Abdominal hysterectomy   . Oophorectomy 2009  . Cholecystectomy 11/13/10    History reviewed. No pertinent family history.  History  Substance Use Topics  . Smoking status: Former Games developer  . Smokeless tobacco: Not on file  . Alcohol Use: Yes    OB History    Grav Para Term Preterm Abortions TAB SAB Ect Mult Living                  Review of Systems  Constitutional: Negative for fever, chills, activity change and fatigue.  HENT: Negative for congestion, facial swelling, rhinorrhea, neck pain and ear discharge.   Eyes: Positive for redness and itching. Negative for photophobia and pain.  Respiratory: Positive for cough. Negative for choking, shortness of breath and wheezing.   Cardiovascular: Negative for chest pain.  Genitourinary: Negative for dysuria.  Musculoskeletal: Negative for myalgias.  Neurological: Negative for headaches.    Allergies  Sulfa antibiotics  Home Medications   Current Outpatient Rx  Name Route Sig Dispense Refill  . ACETAMINOPHEN-CODEINE #3 300-30 MG PO TABS Oral Take 1-2 tablets by mouth every 6 (six) hours as needed for pain. 15 tablet 0  . CETIRIZINE-PSEUDOEPHEDRINE ER 5-120 MG PO TB12 Oral Take 1 tablet by mouth daily. 30 tablet 0  . CYCLOBENZAPRINE HCL 10 MG PO TABS Oral Take 10  mg by mouth at bedtime as needed. Take 1/2 tablet at bedtime as needed for muscle spasm     . ESTRADIOL PO Oral Take by mouth daily.      Marland Kitchen ESTROGENS CONJUGATED 0.3 MG PO TABS Oral Take 0.3 mg by mouth daily. Take daily for 21 days then do not take for 7 days.    Marland Kitchen HYDROCODONE-ACETAMINOPHEN 5-500 MG PO TABS Oral Take 1 tablet by mouth every 6 (six) hours as needed.      Marland Kitchen NABUMETONE 500 MG PO TABS Oral Take 500 mg by mouth 2 (two) times daily as needed. Take one to two tablets every 12 hours for pain not relieved by Tylenol     . PANTOPRAZOLE SODIUM 40 MG PO TBEC Oral Take 40 mg by mouth daily.        BP 117/80  Pulse 72  Temp(Src) 98.2 F (36.8 C) (Oral)  Resp 18  SpO2 100%  Physical Exam  Nursing note and vitals reviewed. Constitutional: She appears well-developed and well-nourished.  Non-toxic appearance. She does not have a sickly appearance. She does not appear ill. No distress.  HENT:  Head: Normocephalic.  Right Ear: Tympanic membrane normal.  Left Ear: Tympanic membrane normal.  Mouth/Throat: Uvula is midline and mucous membranes are normal. No oropharyngeal exudate, posterior oropharyngeal edema, posterior oropharyngeal erythema or tonsillar abscesses.  Eyes: EOM are normal. Pupils are equal, round, and reactive to light. Right eye exhibits no discharge and no exudate. Left eye exhibits no discharge and no exudate. Right conjunctiva is not injected. Right conjunctiva has no hemorrhage. Left conjunctiva is injected. Left conjunctiva has no hemorrhage.  Neck: Neck supple. No JVD present.  Cardiovascular: Normal rate.   No murmur heard. Pulmonary/Chest: Effort normal and breath sounds normal. No respiratory distress. She has no decreased breath sounds. She has no wheezes. She has no rhonchi. She has no rales.  Abdominal: Bowel sounds are normal. She exhibits no mass. There is no tenderness. There is no rebound.  Musculoskeletal: Normal range of motion.  Lymphadenopathy:    She has no cervical adenopathy.  Skin: No rash noted.    ED Course  Procedures (including critical care time)  Labs Reviewed - No data to display No results found.   No diagnosis found.    MDM  Patient with a recurrent cough. Afebrile with normal pulse oxygenation. Patient is her third visit related to this cough. The patient is not a smoker was describing right-sided chest pain on her last visit so we will this time further evaluate with a chest x-ray this recurrent cough.        Jimmie Molly, MD 08/14/11 1144  Jimmie Molly, MD 08/14/11 1209

## 2011-08-14 NOTE — ED Notes (Signed)
Pt called x 1 no answer

## 2011-08-14 NOTE — Discharge Instructions (Signed)
   We will contact you if official x-ray interpretation defers from my reading. There is no signs of atelectasis ( as discussed ) or any other problem such as pneumonia. Take this other medicine (prednisone) for 5 days and followup with your primary care doctor from if for cough continues. Your lung exam was unremarkable and some of your other symptoms have improved since her last visit. This cough is probably reactive to your recent upper respiratory infection in a we will get better in time   Cough, Adult  A cough is a reflex. It helps you clear your throat and airways. A cough can help heal your body. A cough can last 2 or 3 weeks (acute) or may last more than 8 weeks (chronic). Some common causes of a cough can include an infection, allergy, or a cold. HOME CARE  Only take medicine as told by your doctor.   If given, take your medicines (antibiotics) as told. Finish them even if you start to feel better.   Use a cold steam vaporizer or humidier in your home. This can help loosen thick spit (secretions).   Sleep so you are almost sitting up (semi-upright). Use pillows to do this. This helps reduce coughing.   Rest as needed.   Stop smoking if you smoke.  GET HELP RIGHT AWAY IF:  You have yellowish-white fluid (pus) in your thick spit.   Your cough gets worse.   Your medicine does not reduce coughing, and you are losing sleep.   You cough up blood.   You have trouble breathing.   Your pain gets worse and medicine does not help.   You have a fever.  MAKE SURE YOU:   Understand these instructions.   Will watch your condition.   Will get help right away if you are not doing well or get worse.  Document Released: 11/16/2010 Document Revised: 02/22/2011 Document Reviewed: 11/16/2010 [facility] Patient Information 2012 [place], [place].

## 2011-08-14 NOTE — ED Notes (Signed)
Pt having cold/uri symptoms for over a week now. She was here before and states nothing has helped her get better yet.

## 2011-09-19 ENCOUNTER — Encounter: Payer: Self-pay | Admitting: Obstetrics and Gynecology

## 2011-10-05 ENCOUNTER — Encounter: Payer: Self-pay | Admitting: Obstetrics and Gynecology

## 2011-10-05 ENCOUNTER — Ambulatory Visit (INDEPENDENT_AMBULATORY_CARE_PROVIDER_SITE_OTHER): Payer: 59 | Admitting: Obstetrics and Gynecology

## 2011-10-05 VITALS — BP 110/70 | HR 80 | Ht 66.0 in | Wt 192.0 lb

## 2011-10-05 DIAGNOSIS — Z124 Encounter for screening for malignant neoplasm of cervix: Secondary | ICD-10-CM

## 2011-10-05 DIAGNOSIS — N3289 Other specified disorders of bladder: Secondary | ICD-10-CM

## 2011-10-05 DIAGNOSIS — E119 Type 2 diabetes mellitus without complications: Secondary | ICD-10-CM

## 2011-10-05 DIAGNOSIS — E109 Type 1 diabetes mellitus without complications: Secondary | ICD-10-CM

## 2011-10-05 DIAGNOSIS — R5381 Other malaise: Secondary | ICD-10-CM

## 2011-10-05 DIAGNOSIS — Z0000 Encounter for general adult medical examination without abnormal findings: Secondary | ICD-10-CM

## 2011-10-05 DIAGNOSIS — R3989 Other symptoms and signs involving the genitourinary system: Secondary | ICD-10-CM

## 2011-10-05 DIAGNOSIS — R21 Rash and other nonspecific skin eruption: Secondary | ICD-10-CM

## 2011-10-05 DIAGNOSIS — R5383 Other fatigue: Secondary | ICD-10-CM

## 2011-10-05 DIAGNOSIS — N951 Menopausal and female climacteric states: Secondary | ICD-10-CM

## 2011-10-05 LAB — LIPID PANEL
Cholesterol: 240 mg/dL — ABNORMAL HIGH (ref 0–200)
HDL: 49 mg/dL (ref >39)
LDL Cholesterol: 163 mg/dL — ABNORMAL HIGH (ref 0–99)
Total CHOL/HDL Ratio: 4.9 Ratio
Triglycerides: 142 mg/dL (ref <150)
VLDL: 28 mg/dL (ref 0–40)

## 2011-10-05 LAB — POCT URINALYSIS DIPSTICK
Bilirubin, UA: NEGATIVE
Blood, UA: NEGATIVE
Glucose, UA: NEGATIVE
Ketones, UA: NEGATIVE
Leukocytes, UA: NEGATIVE
Nitrite, UA: NEGATIVE
Protein, UA: NEGATIVE
Spec Grav, UA: 1.01
Urobilinogen, UA: NEGATIVE

## 2011-10-05 LAB — CBC
HCT: 33.8 % — ABNORMAL LOW (ref 36.0–46.0)
Hemoglobin: 11.2 g/dL — ABNORMAL LOW (ref 12.0–15.0)
MCH: 28.4 pg (ref 26.0–34.0)
MCHC: 33.1 g/dL (ref 30.0–36.0)
MCV: 85.6 fL (ref 78.0–100.0)
Platelets: 340 10*3/uL (ref 150–400)
RBC: 3.95 MIL/uL (ref 3.87–5.11)
RDW: 14.3 % (ref 11.5–15.5)
WBC: 6.7 10*3/uL (ref 4.0–10.5)

## 2011-10-05 LAB — HEMOGLOBIN A1C
Hgb A1c MFr Bld: 6.1 % — ABNORMAL HIGH (ref <5.7)
Mean Plasma Glucose: 128 mg/dL — ABNORMAL HIGH (ref <117)

## 2011-10-05 LAB — TSH: TSH: 0.745 u[IU]/mL (ref 0.350–4.500)

## 2011-10-05 MED ORDER — CLOTRIMAZOLE-BETAMETHASONE 1-0.05 % EX CREA: TOPICAL_CREAM | Freq: Two times a day (BID) | CUTANEOUS | Status: DC

## 2011-10-05 MED ORDER — ESTRADIOL 2 MG PO TABS: 2.0000 mg | ORAL_TABLET | Freq: Every day | ORAL | Status: DC

## 2011-10-05 NOTE — Progress Notes (Signed)
Last Pap: 09/2010 WNL: Yes Regular Periods:no Contraception: hysterectomy   Monthly Breast exam:yes Tetanus<3yrs:yes Nl.Bladder Function:no, pressure and frequency Daily BMs:yes Healthy Diet:no Pt states it is decent Calcium:no Mammogram:yes Date of Mammogram: 09/2010 Exercise:no Have often Exercise: walks daily  Seatbelt: yes Abuse at home: no Stressful work:yes Sigmoid-colonoscopy: yes 2003 per pt Pt c/o hot flushes.  She ran out of estradiol 2mg  and is now taking premarin sample .3 mg.  She has hpt flushes all day.  She smokes cigarettes 1 pack lasts two weeks.  No h/o of VTE or DVT Pt states she does want to quit tobacco use.  She also c/o of feeling very tired.  If she gets sleep or rest she still feels very tired.  She reports her blood sugars are good.  She also c/o of a raised rash on her back.  She has been sweating a lot especially at work.   Bone Density: No PCP: Dr. Clovis Riley Change in PMH: n/a Change in FMH:n/a  BP 110/70  Pulse 80  Ht 5\' 6"  (1.676 m)  Wt 192 lb (87.091 kg)  BMI 30.99 kg/m2 Physical Examination: General appearance - alert, well appearing, and in no distress Mental status - normal mood, behavior, speech, dress, motor activity, and thought processes Neck - supple, no significant adenopathy, thyroid exam: thyroid is normal in size without nodules or tenderness Chest - clear to auscultation, no wheezes, rales or rhonchi, symmetric air entry Heart - normal rate and regular rhythm Abdomen - soft, nontender, nondistended, no masses or organomegaly Breasts - breasts appear normal, no suspicious masses, no skin or nipple changes or axillary nodes Pelvic - normal external genitalia, vulva, vagina, cervix, uterus and adnexa Rectal - normal rectal, no masses, rectal exam not indicated Back exam - full range of motion, no tenderness, palpable spasm or pain on motion Neurological - alert, oriented, normal speech, no focal findings or movement disorder  noted Musculoskeletal - no joint tenderness, deformity or swelling Extremities - no edema, redness or tenderness in the calves or thighs Skin - normal coloration and turgor, no rashes, no suspicious skin lesions noted Routine exam Pap sent yes Mammogram due yes and scheduled pt with fatigue.  check labs.  encouraged daily exercise.  lotrisone for rash on her back Pt with menopausal symptoms.  Pt given a rx for estradiol.  Stop premarin.  Told risks and benefits and increased risks with tobacco use.   Discussed tobaccoe cesation RT 4 months for f/u

## 2011-10-05 NOTE — Patient Instructions (Signed)

## 2011-10-07 LAB — URINE CULTURE
Colony Count: NO GROWTH
Organism ID, Bacteria: NO GROWTH

## 2011-10-09 LAB — VITAMIN D 1,25 DIHYDROXY
Vitamin D 1, 25 (OH)2 Total: 110 pg/mL — ABNORMAL HIGH (ref 18–72)
Vitamin D2 1, 25 (OH)2: <8 pg/mL
Vitamin D3 1, 25 (OH)2: 110 pg/mL

## 2011-10-09 LAB — PAP IG W/ RFLX HPV ASCU

## 2011-10-15 ENCOUNTER — Telehealth: Payer: Self-pay

## 2011-10-15 NOTE — Telephone Encounter (Signed)
Spoke to pt rgd labs informed pap showed abnl cell nees colpo colpo sch 11/02/11 at 3:15 with ND also informed pt need f/u with pcp for abnl hgb a1c,vit d, cholesterol pt voice understanding

## 2011-10-15 NOTE — Telephone Encounter (Signed)
Message copied by Rolla Plate on Mon Oct 15, 2011 12:00 PM ------      Message from: Jaymes Graff      Created: Sun Oct 14, 2011 11:18 PM       Please schedule pt for colposcopy.  Tell pt to follow up with PCP for abnormal A1C, cholesterol, total vitamin D levels, .  Thanks

## 2011-10-30 ENCOUNTER — Telehealth: Payer: Self-pay

## 2011-10-30 NOTE — Telephone Encounter (Signed)
Spoke with pt rgd msg informed pt some one from appt has been calling to r/s appt for 10/30/11 due to ND being in surgery pt states no one tried to call her to r/s pt has appt 11/16/11 at 3:15 with ND pt voice understanding pt very upset about having to r/s

## 2011-10-31 ENCOUNTER — Telehealth: Payer: Self-pay | Admitting: Obstetrics and Gynecology

## 2011-10-31 ENCOUNTER — Ambulatory Visit: Payer: PRIVATE HEALTH INSURANCE | Attending: Family Medicine | Admitting: Physical Therapy

## 2011-10-31 DIAGNOSIS — IMO0001 Reserved for inherently not codable concepts without codable children: Secondary | ICD-10-CM | POA: Insufficient documentation

## 2011-10-31 DIAGNOSIS — M545 Low back pain, unspecified: Secondary | ICD-10-CM | POA: Insufficient documentation

## 2011-10-31 DIAGNOSIS — R293 Abnormal posture: Secondary | ICD-10-CM | POA: Insufficient documentation

## 2011-10-31 DIAGNOSIS — M25569 Pain in unspecified knee: Secondary | ICD-10-CM | POA: Insufficient documentation

## 2011-10-31 NOTE — Telephone Encounter (Signed)
Spoke with pt rgd msg pt wants to r/s colpo pt has appt 12/04/11 at 11:00 with ND pt voice understanding

## 2011-11-02 ENCOUNTER — Encounter: Payer: 59 | Admitting: Obstetrics and Gynecology

## 2011-11-05 ENCOUNTER — Ambulatory Visit: Payer: PRIVATE HEALTH INSURANCE | Admitting: Physical Therapy

## 2011-11-07 ENCOUNTER — Encounter: Payer: PRIVATE HEALTH INSURANCE | Admitting: Physical Therapy

## 2011-11-08 ENCOUNTER — Ambulatory Visit: Payer: PRIVATE HEALTH INSURANCE | Admitting: Physical Therapy

## 2011-11-08 ENCOUNTER — Emergency Department (INDEPENDENT_AMBULATORY_CARE_PROVIDER_SITE_OTHER)
Admission: EM | Admit: 2011-11-08 | Discharge: 2011-11-08 | Disposition: A | Discharge status: Home / Self Care | Payer: 59 | Source: Home / Self Care | Attending: Emergency Medicine | Admitting: Emergency Medicine

## 2011-11-08 ENCOUNTER — Encounter (HOSPITAL_COMMUNITY): Payer: Self-pay | Admitting: Emergency Medicine

## 2011-11-08 DIAGNOSIS — N390 Urinary tract infection, site not specified: Secondary | ICD-10-CM

## 2011-11-08 LAB — POCT URINALYSIS DIP (DEVICE)
Bilirubin Urine: NEGATIVE
Glucose, UA: NEGATIVE mg/dL
Ketones, ur: NEGATIVE mg/dL
Nitrite: POSITIVE — AB
Protein, ur: NEGATIVE mg/dL
Specific Gravity, Urine: <=1.005 (ref 1.005–1.030)
Urobilinogen, UA: 0.2 mg/dL (ref 0.0–1.0)
pH: 5.5 (ref 5.0–8.0)

## 2011-11-08 MED ORDER — CEPHALEXIN 500 MG PO CAPS: 500.0000 mg | ORAL_CAPSULE | Freq: Four times a day (QID) | ORAL | Status: DC

## 2011-11-08 MED ORDER — CEPHALEXIN 500 MG PO CAPS: 500.0000 mg | ORAL_CAPSULE | Freq: Four times a day (QID) | ORAL | Status: AC

## 2011-11-08 NOTE — ED Provider Notes (Signed)
History     CSN: [id number]  Arrival date & time 11/08/11  1137   First MD Initiated Contact with Patient 11/08/11 1137      Chief Complaint  Patient presents with  . Urinary Frequency    (Consider location/radiation/quality/duration/timing/severity/associated sxs/prior treatment) HPI Comments: Patient presents to urgent care this afternoon complaining of ongoing pressure sensation and discomfort urinating along with a sensation that she still does urinate more but she can't. She's been having the symptoms for about 7 days and she's been taking Uristat over-the-counter without any relief. She has not had the opportunity to call her doctor or her gynecologist. She also describes a for several months she has had the sensation that when she finishes urinating it just feels like she needs to urinate more but she can't. She goes into describing that she has had this conversation with her gynecologist. Patient denies any fevers, flank pain, nausea or vomiting. Does describe pressure points to her suprapubic region. Patient denies any constitutional symptoms such as unintentional weight loss, bodyaches, or changes in appetite.  Patient is a 51 y.o. female presenting with frequency. The history is provided by the patient.  Urinary Frequency This is a new problem. The current episode started more than 2 days ago. The problem occurs constantly. The problem has not changed since onset.Associated symptoms include abdominal pain. She has tried nothing for the symptoms.    Past Medical History  Diagnosis Date  . Borderline diabetic   . Weight gain   . Sleep deprivation     loss of sleep  . Fatigue   . Shortness of breath     with pain   . Nausea   . Blurred vision   . Wears glasses   . Arthritis   . Migraine headache   . History of measles, mumps, or rubella   . H/O varicella   . Hx of ovarian cyst   . Yeast infection   . H/O bacterial infection     Past Surgical History  Procedure Date   . Abdominal hysterectomy   . Oophorectomy 2009  . Cholecystectomy 11/13/10  . Dilation and curettage of uterus   . Tubal ligation     Family History  Problem Relation Age of Onset  . Hypertension Mother   . Hypotension Mother   . Anemia Mother     low iron  . Heart disease Sister   . Sickle cell trait Other     History  Substance Use Topics  . Smoking status: Current Some Day Smoker -- 0.5 packs/day    Types: Cigarettes  . Smokeless tobacco: Not on file  . Alcohol Use: No     socially     OB History    Grav Para Term Preterm Abortions TAB SAB Ect Mult Living   3 2 2  1     2       Review of Systems  Constitutional: Negative for activity change and appetite change.  Gastrointestinal: Positive for abdominal pain.  Genitourinary: Positive for dysuria, urgency, frequency, decreased urine volume and difficulty urinating. Negative for hematuria, flank pain, vaginal bleeding, vaginal discharge, genital sores and vaginal pain.    Allergies  Sulfa antibiotics  Home Medications   Current Outpatient Rx  Name Route Sig Dispense Refill  . ESTRADIOL 2 MG PO TABS Oral Take 1 tablet (2 mg total) by mouth daily. 30 tablet 6  . CEPHALEXIN 500 MG PO CAPS Oral Take 1 capsule (500 mg total) by mouth 4 (four)  times daily. 28 capsule 0  . CETIRIZINE-PSEUDOEPHEDRINE ER 5-120 MG PO TB12 Oral Take 1 tablet by mouth daily. 30 tablet 0  . CLOTRIMAZOLE-BETAMETHASONE 1-0.05 % EX CREA Topical Apply topically 2 (two) times daily. For seven days 30 g 1  . CYCLOBENZAPRINE HCL 10 MG PO TABS Oral Take 10 mg by mouth at bedtime as needed. Take 1/2 tablet at bedtime as needed for muscle spasm     . ESTROGENS CONJUGATED 0.3 MG PO TABS Oral Take 0.3 mg by mouth daily. Take daily for 21 days then do not take for 7 days.    Marland Kitchen HYDROCODONE-ACETAMINOPHEN 5-500 MG PO TABS Oral Take 1 tablet by mouth every 6 (six) hours as needed.      Marland Kitchen NABUMETONE 500 MG PO TABS Oral Take 500 mg by mouth 2 (two) times daily as  needed. Take one to two tablets every 12 hours for pain not relieved by Tylenol     . PANTOPRAZOLE SODIUM 40 MG PO TBEC Oral Take 40 mg by mouth daily.       BP 131/90  Pulse 83  Temp 98.3 F (36.8 C) (Oral)  Resp 17  SpO2 96%  Physical Exam  Nursing note and vitals reviewed. Constitutional: She appears well-developed and well-nourished.  Eyes: Conjunctivae are normal.  Neck: Neck supple.  Abdominal: Soft. Bowel sounds are normal. She exhibits no distension and no mass. There is tenderness in the suprapubic area. There is no rigidity, no rebound, no guarding, no CVA tenderness, no tenderness at McBurney's point and negative Murphy's sign.      ED Course  Procedures (including critical care time)  Labs Reviewed  POCT URINALYSIS DIP (DEVICE) - Abnormal; Notable for the following:    Hgb urine dipstick TRACE (*)     Nitrite POSITIVE (*)     Leukocytes, UA SMALL (*)  Biochemical Testing Only. Please order routine urinalysis from main lab if confirmatory testing is needed.   All other components within normal limits  URINE CULTURE   No results found.   1. Urinary tract infection       MDM  Patient has been symptomatic for 7 days. No systemic symptoms, no flank pain, no fevers, no vomiting. Abnormal urine have sent samples for cultures. Patient has been started on Keflex. Patient had been describing during this encounter that she has had ongoing urinary symptoms such as hesitancy and sensation of occupation after voiding. Have been discussed with her gynecologist. I have encouraged her to talk to her primary care Dr. about the symptoms to be considered for a referral to see a neurologist and have urodynamic studies. She agrees with treatment plan and followup care she will call her doctor to arrange a followup appointment and discuss his symptoms further. I have also discussed with her what symptoms should require of further evaluation in the emergency department if she does not  respond to antibiotics. She knowledge is the symptoms and agrees with treatment plan and followup care.        Jimmie Molly, MD 11/08/11 1425

## 2011-11-08 NOTE — ED Notes (Signed)
Pt sts she has the urge to urinate frequently that began 8/20; complains of constant lower abdominal pain as well as pain with urination; denies burning with urination.  Pt has been using OTC Uristat without relief.

## 2011-11-10 LAB — URINE CULTURE: Colony Count: >=100000

## 2011-11-12 ENCOUNTER — Ambulatory Visit: Payer: PRIVATE HEALTH INSURANCE | Admitting: Physical Therapy

## 2011-11-13 NOTE — ED Notes (Signed)
Urine culture: >100,000 colonies Citrobacter Koseri.  Pt. adequately treated with Keflex. [name] 11/13/2011

## 2011-11-14 ENCOUNTER — Ambulatory Visit: Payer: PRIVATE HEALTH INSURANCE | Admitting: Physical Therapy

## 2011-11-16 ENCOUNTER — Encounter: Payer: 59 | Admitting: Obstetrics and Gynecology

## 2011-11-23 ENCOUNTER — Ambulatory Visit: Payer: PRIVATE HEALTH INSURANCE | Attending: Family Medicine

## 2011-11-23 DIAGNOSIS — M545 Low back pain, unspecified: Secondary | ICD-10-CM | POA: Insufficient documentation

## 2011-11-23 DIAGNOSIS — IMO0001 Reserved for inherently not codable concepts without codable children: Secondary | ICD-10-CM | POA: Insufficient documentation

## 2011-11-23 DIAGNOSIS — M25569 Pain in unspecified knee: Secondary | ICD-10-CM | POA: Insufficient documentation

## 2011-11-23 DIAGNOSIS — R293 Abnormal posture: Secondary | ICD-10-CM | POA: Insufficient documentation

## 2011-11-26 ENCOUNTER — Ambulatory Visit: Payer: PRIVATE HEALTH INSURANCE | Admitting: Rehabilitative and Restorative Service Providers"

## 2011-11-30 ENCOUNTER — Ambulatory Visit: Payer: PRIVATE HEALTH INSURANCE

## 2011-12-03 ENCOUNTER — Encounter: Payer: 59 | Admitting: Physical Therapy

## 2011-12-04 ENCOUNTER — Ambulatory Visit (INDEPENDENT_AMBULATORY_CARE_PROVIDER_SITE_OTHER): Payer: 59 | Admitting: Obstetrics and Gynecology

## 2011-12-04 ENCOUNTER — Encounter: Payer: Self-pay | Admitting: Obstetrics and Gynecology

## 2011-12-04 VITALS — BP 122/84 | Wt 191.0 lb

## 2011-12-04 DIAGNOSIS — IMO0002 Reserved for concepts with insufficient information to code with codable children: Secondary | ICD-10-CM

## 2011-12-04 DIAGNOSIS — R87612 Low grade squamous intraepithelial lesion on cytologic smear of cervix (LGSIL): Secondary | ICD-10-CM

## 2011-12-04 NOTE — Patient Instructions (Signed)
Colposcopy Care After Colposcopy is a procedure in which a special tool is used to magnify the surface of the cervix. A tissue sample (biopsy) may also be taken. This sample will be looked at for cervical cancer or other problems. After the test:  You may have some cramping.   Lie down for a few minutes if you feel lightheaded.    You may have some bleeding which should stop in a few days.  HOME CARE  Do not have sex or use tampons for 2 to 3 days or as told.   Only take medicine as told by your doctor.   Continue to take your birth control pills as usual.  Finding out the results of your test Ask when your test results will be ready. Make sure you get your test results. GET HELP RIGHT AWAY IF:  You are bleeding a lot or are passing blood clots.   You develop a fever of 102 F (38.9 C) or higher.   You have abnormal vaginal discharge.   You have cramps that do not go away with medicine.   You feel lightheaded, dizzy, or pass out (faint).  MAKE SURE YOU:   Understand these instructions.   Will watch your condition.   Will get help right away if you are not doing well or get worse.  Document Released: 08/22/2007 Document Revised: 02/22/2011 Document Reviewed: 08/22/2007 [employer] Patient Information 2012 [employer]. 

## 2011-12-04 NOTE — Progress Notes (Signed)
Previous Pap Smear: 10/05/11 EPITHELIAL CELL ABNORMALITY: SQUAMOUS CELLS LOW-GRADE SQUAMOUS INTRAEPITHELIAL LESION (LSIL) ENCOMPASSING: HPV/MILD DYSPLASIA/VAIN 1  Previous Colposcopy: n/a Referred From: n/a LMP: hysterectomy  Contraception: hysterectomy G,P: 3,2  Pt given Ibuprofen 600 mg in office.  colpo done of vaginal cuff Aw change at the cuff bx done  Rt 6 months

## 2011-12-05 ENCOUNTER — Ambulatory Visit: Payer: PRIVATE HEALTH INSURANCE | Admitting: Physical Therapy

## 2011-12-06 LAB — PATHOLOGY

## 2011-12-10 ENCOUNTER — Ambulatory Visit: Payer: PRIVATE HEALTH INSURANCE | Admitting: Physical Therapy

## 2011-12-10 ENCOUNTER — Telehealth: Payer: Self-pay

## 2011-12-10 NOTE — Telephone Encounter (Signed)
Pt may have macrodantin 100mg  po times one after IC.  Disp #30 with 2 refills

## 2011-12-10 NOTE — Telephone Encounter (Signed)
Spoke with pt rgd msg pt states was suppose to get rx  for frequent UTIs pt states rx not at pharm advised pt will consult with ND and call her back pt voice understanding

## 2011-12-11 MED ORDER — NITROFURANTOIN MONOHYD MACRO 100 MG PO CAPS: ORAL_CAPSULE | ORAL | Status: DC

## 2011-12-11 NOTE — Telephone Encounter (Signed)
Lm on vm rx sent to pharm 

## 2011-12-12 ENCOUNTER — Ambulatory Visit (HOSPITAL_COMMUNITY)
Admission: RE | Admit: 2011-12-12 | Discharge: 2011-12-12 | Disposition: A | Discharge status: Home / Self Care | Payer: PRIVATE HEALTH INSURANCE | Source: Ambulatory Visit | Attending: Family Medicine | Admitting: Family Medicine

## 2011-12-12 ENCOUNTER — Other Ambulatory Visit (HOSPITAL_COMMUNITY): Payer: Self-pay | Admitting: Family Medicine

## 2011-12-12 DIAGNOSIS — M545 Low back pain, unspecified: Secondary | ICD-10-CM | POA: Insufficient documentation

## 2011-12-12 DIAGNOSIS — R52 Pain, unspecified: Secondary | ICD-10-CM

## 2011-12-12 DIAGNOSIS — M79609 Pain in unspecified limb: Secondary | ICD-10-CM | POA: Insufficient documentation

## 2011-12-13 ENCOUNTER — Telehealth: Payer: Self-pay

## 2011-12-13 NOTE — Telephone Encounter (Signed)
Spoke with pt rgd colpo results advised pt colpo consistent with pap smear pt wants to know what to do next advised pt will consult with ND and call her back pt voice understanding

## 2011-12-19 ENCOUNTER — Telehealth: Payer: Self-pay | Admitting: Obstetrics and Gynecology

## 2011-12-21 ENCOUNTER — Encounter (HOSPITAL_COMMUNITY): Payer: Self-pay | Admitting: *Deleted

## 2011-12-21 ENCOUNTER — Emergency Department (HOSPITAL_COMMUNITY)
Admission: EM | Admit: 2011-12-21 | Discharge: 2011-12-22 | Disposition: A | Discharge status: Home / Self Care | Payer: PRIVATE HEALTH INSURANCE | Attending: Emergency Medicine | Admitting: Emergency Medicine

## 2011-12-21 ENCOUNTER — Emergency Department (HOSPITAL_COMMUNITY): Payer: PRIVATE HEALTH INSURANCE

## 2011-12-21 DIAGNOSIS — M62838 Other muscle spasm: Secondary | ICD-10-CM | POA: Insufficient documentation

## 2011-12-21 DIAGNOSIS — Z9089 Acquired absence of other organs: Secondary | ICD-10-CM | POA: Insufficient documentation

## 2011-12-21 DIAGNOSIS — Z8739 Personal history of other diseases of the musculoskeletal system and connective tissue: Secondary | ICD-10-CM | POA: Insufficient documentation

## 2011-12-21 DIAGNOSIS — Z79899 Other long term (current) drug therapy: Secondary | ICD-10-CM | POA: Insufficient documentation

## 2011-12-21 DIAGNOSIS — M549 Dorsalgia, unspecified: Secondary | ICD-10-CM | POA: Insufficient documentation

## 2011-12-21 LAB — CBC WITH DIFFERENTIAL/PLATELET
Basophils Absolute: 0 10*3/uL (ref 0.0–0.1)
Basophils Relative: 0 % (ref 0–1)
Eosinophils Absolute: 0.2 10*3/uL (ref 0.0–0.7)
Eosinophils Relative: 3 % (ref 0–5)
HCT: 36.2 % (ref 36.0–46.0)
Hemoglobin: 12.2 g/dL (ref 12.0–15.0)
Lymphocytes Relative: 43 % (ref 12–46)
Lymphs Abs: 3.1 10*3/uL (ref 0.7–4.0)
MCH: 29.1 pg (ref 26.0–34.0)
MCHC: 33.7 g/dL (ref 30.0–36.0)
MCV: 86.4 fL (ref 78.0–100.0)
Monocytes Absolute: 0.7 10*3/uL (ref 0.1–1.0)
Monocytes Relative: 9 % (ref 3–12)
Neutro Abs: 3.3 10*3/uL (ref 1.7–7.7)
Neutrophils Relative %: 45 % (ref 43–77)
Platelets: 303 10*3/uL (ref 150–400)
RBC: 4.19 MIL/uL (ref 3.87–5.11)
RDW: 14.4 % (ref 11.5–15.5)
WBC: 7.3 10*3/uL (ref 4.0–10.5)

## 2011-12-21 LAB — COMPREHENSIVE METABOLIC PANEL
ALT: 9 U/L (ref 0–35)
AST: 11 U/L (ref 0–37)
Albumin: 3.5 g/dL (ref 3.5–5.2)
Alkaline Phosphatase: 76 U/L (ref 39–117)
BUN: 12 mg/dL (ref 6–23)
CO2: 24 mEq/L (ref 19–32)
Calcium: 9.6 mg/dL (ref 8.4–10.5)
Chloride: 104 mEq/L (ref 96–112)
Creatinine, Ser: 1.05 mg/dL (ref 0.50–1.10)
GFR calc Af Amer: 71 mL/min — ABNORMAL LOW (ref >90)
GFR calc non Af Amer: 61 mL/min — ABNORMAL LOW (ref >90)
Glucose, Bld: 105 mg/dL — ABNORMAL HIGH (ref 70–99)
Potassium: 3.9 mEq/L (ref 3.5–5.1)
Sodium: 137 mEq/L (ref 135–145)
Total Bilirubin: 0.2 mg/dL — ABNORMAL LOW (ref 0.3–1.2)
Total Protein: 7.2 g/dL (ref 6.0–8.3)

## 2011-12-21 LAB — D-DIMER, QUANTITATIVE (NOT AT ARMC): D-Dimer, Quant: 0.29 ug/mL-FEU (ref 0.00–0.48)

## 2011-12-21 MED ORDER — ONDANSETRON HCL 4 MG/2ML IJ SOLN
4.0000 mg | Freq: Once | INTRAMUSCULAR | Status: AC
  Administered 2011-12-21: 4 mg via INTRAVENOUS
  Filled 2011-12-21: qty 2

## 2011-12-21 MED ORDER — HYDROMORPHONE HCL PF 1 MG/ML IJ SOLN
0.5000 mg | Freq: Once | INTRAMUSCULAR | Status: AC
  Administered 2011-12-21: 0.5 mg via INTRAVENOUS
  Filled 2011-12-21: qty 1

## 2011-12-21 MED ORDER — DIAZEPAM 5 MG/ML IJ SOLN
5.0000 mg | Freq: Once | INTRAMUSCULAR | Status: AC
  Administered 2011-12-21: 5 mg via INTRAVENOUS
  Filled 2011-12-21: qty 2

## 2011-12-21 MED ORDER — IBUPROFEN 200 MG PO TABS
400.0000 mg | ORAL_TABLET | Freq: Once | ORAL | Status: AC
  Administered 2011-12-21: 400 mg via ORAL
  Filled 2011-12-21: qty 2

## 2011-12-21 MED ORDER — OXYCODONE-ACETAMINOPHEN 5-325 MG PO TABS
1.0000 | ORAL_TABLET | Freq: Once | ORAL | Status: AC
  Administered 2011-12-21: 1 via ORAL
  Filled 2011-12-21: qty 1

## 2011-12-21 NOTE — ED Notes (Signed)
Received bedside report from [place], [place].  Phlebotomy currently at bedside.  Will continue to monitor.

## 2011-12-21 NOTE — ED Notes (Addendum)
C/o L upper back pain, describes as "stabbing & spasms", "takes my breath away", constant, ongoing for 2-3d, worse tonight, hurts worse with movement and twisting, also "worse with deep inspiration, sometimes feel sob" (Denies: cough, congestion, cold sx, fever &  Nvd). No meds PTA. Has seen orthopedist for lower back pain.

## 2011-12-22 MED ORDER — IBUPROFEN 600 MG PO TABS: 600.0000 mg | ORAL_TABLET | Freq: Four times a day (QID) | ORAL | Status: DC | PRN

## 2011-12-22 MED ORDER — DIAZEPAM 5 MG PO TABS: 5.0000 mg | ORAL_TABLET | Freq: Four times a day (QID) | ORAL | Status: DC | PRN

## 2011-12-22 MED ORDER — OXYCODONE-ACETAMINOPHEN 5-325 MG PO TABS: 1.0000 | ORAL_TABLET | Freq: Three times a day (TID) | ORAL | Status: DC | PRN

## 2011-12-22 NOTE — ED Notes (Signed)
Patient currently resting quietly in bed; no respiratory or acute distress noted. Patient updated on plan of care; informed patient that we are currently waiting on further orders/disposition from EDP.  Patient has no other questions or concerns at this time; will continue to monitor. 

## 2011-12-22 NOTE — ED Notes (Signed)
Patient given discharge paperwork; went over discharge instructions with patient.  Patient instructed to take prescriptions as directed, to not drive while taking narcotics, to follow up with PCP/referral, and to return to the ED for new, worsening, or concerning symptoms.

## 2011-12-22 NOTE — ED Provider Notes (Signed)
History     CSN: [id number]  Arrival date & time 12/21/11  2144   First MD Initiated Contact with Patient 12/21/11 2306      Chief Complaint  Patient presents with  . Back Pain    (Consider location/radiation/quality/duration/timing/severity/associated sxs/prior treatment) HPI Comments: States that for the past 2-3, days.  She's had some discomfort under the left scapula.  Tonight, on her way to work, the pain came acutely worse causing her to almost fall to her knees Denies any trauma, hormone therapy, smoking, recent travel   Patient is a 51 y.o. female presenting with back pain. The history is provided by the patient.  Back Pain  This is a new problem. The current episode started 2 days ago. The problem occurs constantly. The problem has been rapidly worsening. The pain is associated with no known injury. The pain is present in the thoracic spine. The quality of the pain is described as cramping. The pain does not radiate. The pain is at a severity of 10/10. The pain is severe. The symptoms are aggravated by certain positions. Pertinent negatives include no fever and no weakness.    Past Medical History  Diagnosis Date  . Borderline diabetic   . Weight gain   . Sleep deprivation     loss of sleep  . Fatigue   . Shortness of breath     with pain   . Nausea   . Blurred vision   . Wears glasses   . Arthritis   . Migraine headache   . History of measles, mumps, or rubella   . H/O varicella   . Hx of ovarian cyst   . Yeast infection   . H/O bacterial infection     Past Surgical History  Procedure Date  . Abdominal hysterectomy   . Oophorectomy 2009  . Cholecystectomy 11/13/10  . Dilation and curettage of uterus   . Tubal ligation     Family History  Problem Relation Age of Onset  . Hypertension Mother   . Hypotension Mother   . Anemia Mother     low iron  . Heart disease Sister   . Sickle cell trait Other     History  Substance Use Topics  . Smoking  status: Current Some Day Smoker -- 0.5 packs/day    Types: Cigarettes  . Smokeless tobacco: Not on file  . Alcohol Use: No     socially     OB History    Grav Para Term Preterm Abortions TAB SAB Ect Mult Living   3 2 2  1     2       Review of Systems  Constitutional: Positive for activity change. Negative for fever and chills.  Respiratory: Negative for cough and shortness of breath.   Musculoskeletal: Positive for back pain. Negative for myalgias.  Skin: Negative for rash and wound.  Neurological: Negative for dizziness and weakness.    Allergies  Sulfa antibiotics  Home Medications   Current Outpatient Rx  Name Route Sig Dispense Refill  . CYCLOBENZAPRINE HCL 10 MG PO TABS Oral Take 5-10 mg by mouth 3 (three) times daily as needed. For muscle spasms    . ESTRADIOL 2 MG PO TABS Oral Take 1 tablet (2 mg total) by mouth daily. 30 tablet 6  . ESTROGENS CONJUGATED 0.3 MG PO TABS Oral Take 0.3 mg by mouth daily. Take daily for 21 days then do not take for 7 days.    Marland Kitchen HYDROCODONE-ACETAMINOPHEN 7.5-325 MG  PO TABS Oral Take 1 tablet by mouth every 6 (six) hours as needed. For pain    . DIAZEPAM 5 MG PO TABS Oral Take 1 tablet (5 mg total) by mouth every 6 (six) hours as needed for anxiety. 12 tablet 0  . IBUPROFEN 600 MG PO TABS Oral Take 1 tablet (600 mg total) by mouth every 6 (six) hours as needed for pain. 30 tablet 0  . OXYCODONE-ACETAMINOPHEN 5-325 MG PO TABS Oral Take 1 tablet by mouth every 8 (eight) hours as needed for pain. 12 tablet 0    For sever pain    BP 143/84  Pulse 87  Temp 98.4 F (36.9 C) (Oral)  Resp 18  SpO2 97%  Physical Exam  Constitutional: She is oriented to person, place, and time. She appears well-developed and well-nourished.  HENT:  Head: Normocephalic.  Eyes: Pupils are equal, round, and reactive to light.  Neck: Normal range of motion.  Cardiovascular: Normal rate.   Abdominal: Soft.  Musculoskeletal: She exhibits tenderness.        Arms: Neurological: She is alert and oriented to person, place, and time.  Skin: Skin is warm. No rash noted. No erythema.    ED Course  Procedures (including critical care time)  Labs Reviewed  COMPREHENSIVE METABOLIC PANEL - Abnormal; Notable for the following:    Glucose, Bld 105 (*)     Total Bilirubin 0.2 (*)     GFR calc non Af Amer 61 (*)     GFR calc Af Amer 71 (*)     All other components within normal limits  CBC WITH DIFFERENTIAL  D-DIMER, QUANTITATIVE   Dg Chest 2 View  12/21/2011  *RADIOLOGY REPORT*  Clinical Data: Shortness of breath.  Upper back pain.  CHEST - 2 VIEW  Comparison: 08/14/2011  Findings: Heart and mediastinal contours are within normal limits. No focal opacities or effusions.  No acute bony abnormality.  IMPRESSION: No active cardiopulmonary disease.   Original Report Authenticated By: Cyndie Chime, M.D.      1. Muscle spasm       MDM   Dear all within normal limits.  X-ray, reviewed,  Normal limits  Labs reviewed normal limits patient did receive IV Dilaudid, and Valium      Arman Filter, NP 12/22/11 [phone]

## 2011-12-23 NOTE — Telephone Encounter (Signed)
Pt needs to have a pap q 6 months for one year

## 2011-12-23 NOTE — ED Provider Notes (Signed)
Medical screening examination/treatment/procedure(s) were performed by non-physician practitioner and as supervising physician I was immediately available for consultation/collaboration.    Vida Roller, MD 12/23/11 [phone]

## 2011-12-24 ENCOUNTER — Telehealth: Payer: Self-pay | Admitting: Obstetrics and Gynecology

## 2011-12-24 NOTE — Telephone Encounter (Signed)
Pt informed repeat pap in 6 months per nd pt voice understanding

## 2011-12-24 NOTE — Telephone Encounter (Signed)
Spoke with pt informed repeat pap every 6 months per ND pt voice understadning

## 2011-12-27 ENCOUNTER — Other Ambulatory Visit (HOSPITAL_COMMUNITY): Payer: Self-pay | Admitting: Specialist

## 2011-12-27 DIAGNOSIS — M545 Low back pain, unspecified: Secondary | ICD-10-CM

## 2011-12-31 ENCOUNTER — Ambulatory Visit (HOSPITAL_COMMUNITY)
Admission: RE | Admit: 2011-12-31 | Discharge: 2011-12-31 | Disposition: A | Discharge status: Home / Self Care | Payer: PRIVATE HEALTH INSURANCE | Source: Ambulatory Visit | Attending: Specialist | Admitting: Specialist

## 2011-12-31 DIAGNOSIS — N289 Disorder of kidney and ureter, unspecified: Secondary | ICD-10-CM | POA: Insufficient documentation

## 2011-12-31 DIAGNOSIS — M545 Low back pain, unspecified: Secondary | ICD-10-CM

## 2011-12-31 DIAGNOSIS — Z9181 History of falling: Secondary | ICD-10-CM | POA: Insufficient documentation

## 2011-12-31 DIAGNOSIS — M47817 Spondylosis without myelopathy or radiculopathy, lumbosacral region: Secondary | ICD-10-CM | POA: Insufficient documentation

## 2012-02-22 ENCOUNTER — Emergency Department (HOSPITAL_COMMUNITY)
Admission: EM | Admit: 2012-02-22 | Discharge: 2012-02-22 | Disposition: A | Discharge status: Home / Self Care | Payer: 59 | Source: Home / Self Care | Attending: Emergency Medicine | Admitting: Emergency Medicine

## 2012-02-22 ENCOUNTER — Encounter (HOSPITAL_COMMUNITY): Payer: Self-pay | Admitting: Emergency Medicine

## 2012-02-22 DIAGNOSIS — L259 Unspecified contact dermatitis, unspecified cause: Secondary | ICD-10-CM

## 2012-02-22 DIAGNOSIS — G43909 Migraine, unspecified, not intractable, without status migrainosus: Secondary | ICD-10-CM

## 2012-02-22 MED ORDER — TRIAMCINOLONE ACETONIDE 0.1 % EX CREA: TOPICAL_CREAM | Freq: Three times a day (TID) | CUTANEOUS | Status: DC

## 2012-02-22 MED ORDER — PREDNISONE 5 MG PO KIT: 1.0000 | PACK | Freq: Every day | ORAL | Status: DC

## 2012-02-22 MED ORDER — DEXAMETHASONE SODIUM PHOSPHATE 10 MG/ML IJ SOLN
INTRAMUSCULAR | Status: AC
  Filled 2012-02-22: qty 1

## 2012-02-22 MED ORDER — ONDANSETRON 4 MG PO TBDP
8.0000 mg | ORAL_TABLET | Freq: Once | ORAL | Status: AC
  Administered 2012-02-22: 8 mg via ORAL

## 2012-02-22 MED ORDER — KETOROLAC TROMETHAMINE 60 MG/2ML IM SOLN
60.0000 mg | Freq: Once | INTRAMUSCULAR | Status: AC
  Administered 2012-02-22: 60 mg via INTRAMUSCULAR

## 2012-02-22 MED ORDER — ONDANSETRON 4 MG PO TBDP
ORAL_TABLET | ORAL | Status: AC
  Filled 2012-02-22: qty 2

## 2012-02-22 MED ORDER — DEXAMETHASONE SODIUM PHOSPHATE 10 MG/ML IJ SOLN
10.0000 mg | Freq: Once | INTRAMUSCULAR | Status: AC
  Administered 2012-02-22: 10 mg via INTRAMUSCULAR

## 2012-02-22 MED ORDER — KETOROLAC TROMETHAMINE 60 MG/2ML IM SOLN
INTRAMUSCULAR | Status: AC
  Filled 2012-02-22: qty 2

## 2012-02-22 MED ORDER — ISOMETHEPTENE-APAP-DICHLORAL 65-325-100 MG PO CAPS: ORAL_CAPSULE | ORAL | Status: DC

## 2012-02-22 NOTE — ED Provider Notes (Signed)
Chief Complaint  Patient presents with  . Rash  . 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 She has a 20 year history of migraine headaches. She's tried Imitrex and Axert in the past, but these have only made the headaches worse. Her current migraine has been going on for the past 2 days. It's a right-sided throbbing headache with nausea, photophobia, and phonophobia. She denies any visual symptoms. She has not had any neurological symptoms. There no specific migraine triggers. She denies fever or stiff neck.  Her other issue today is that of a rash. It's been going on for a week and is very itchy. It involved the thighs, legs, back, and abdomen. There no exposure to any known contactants or suspicious allergens. No new medications or foods. She got a steroid shot about a month ago because of a back problem. She's allergic to sulfa. She takes estradiol, hydrocodone, and Robaxin. She denies any difficulty breathing or swelling of lips, tongue, or throat.  Review of Systems:  Other than noted above, the patient denies any of the following symptoms: Systemic:  No fever, chills, fatigue, photophobia, stiff neck. Eye:  No redness, eye pain, discharge, blurred vision, or diplopia. ENT:  No nasal congestion, rhinorrhea, sinus pressure or pain, sneezing, earache, or sore throat.  No jaw claudication. Neuro:  No paresthesias, loss of consciousness, seizure activity, muscle weakness, trouble with coordination or gait, trouble speaking or swallowing. Psych:  No depression, anxiety or trouble sleeping.  PMFSH:  Past medical history, family history, social history, meds, and allergies were reviewed.  Physical Exam:   Vital signs:  BP 125/86  Pulse 69  Temp 98.2 F (36.8 C) (Oral)  Resp 16  SpO2 100% General:  Alert and oriented.  In no distress. Eye:  Lids and conjunctivas normal.  PERRL,  Full EOMs.  Fundi benign with normal discs and  vessels. ENT:  No cranial or facial tenderness to palpation.  TMs and canals clear.  Nasal mucosa was normal and uncongested without any drainage. No intra oral lesions, pharynx clear, mucous membranes moist, dentition normal. Neck:  Supple, full ROM, no tenderness to palpation.  No adenopathy or mass. Neuro:  Alert and orented times 3.  Speech was clear, fluent, and appropriate.  Cranial nerves intact. No pronator drift, muscle strength normal. Finger to nose normal.  DTRs were 2+ and symmetrical.Station and gait were normal.  Romberg's sign was normal.  Able to perform tandem gait well. Psych:  Normal affect. Skin: There is an erythematous rash on the abdomen, right thigh, and otherwise her skin was clear. This is not hives. There is a very excoriated. There no lesions on the arms or hands and no lesions inside the mouth.  Medications given in [facility]:  She was given Decadron 10 mg IM, Toradol 60 mg IM, and Zofran ODT 8 mg sublingually. She tolerated these all well without any immediate side effects.  Assessment:  The primary encounter diagnosis was Migraine headache. A diagnosis of Contact dermatitis was also pertinent to this visit.  Plan:   1.  The following meds were prescribed:   New Prescriptions   ISOMETHEPTENE-ACETAMINOPHEN-DICHLORALPHENAZONE (MIDRIN) 65-325-100 MG CAPSULE    Take 2 at onset of headache and 1 every hour thereafter as needed up to 5 per day   PREDNISONE 5 MG KIT    Take 1 kit (5 mg total) by mouth daily after breakfast. Prednisone 5 mg 6 day  dosepack.  Take as directed.   TRIAMCINOLONE CREAM (KENALOG) 0.1 %    Apply topically 3 (three) times daily.   2.  The patient was instructed in symptomatic care and handouts were given. 3.  The patient was told to return if becoming worse in any way, if no better in 3 or 4 days, and given some red flag symptoms that would indicate earlier return.  Follow up:  The patient was told to follow up with Dr. Para Skeans if no improvement of  the rash in one week.     Reuben Likes, MD 02/22/12 [phone]

## 2012-02-22 NOTE — ED Notes (Signed)
Reports rash on legs, stomach, arms for a couple of days ago.  Patient is itching.  Does admits to getting a by ortho.  Patient admits to having migraines.

## 2012-05-16 ENCOUNTER — Telehealth: Payer: Self-pay | Admitting: Family Medicine

## 2012-05-19 ENCOUNTER — Ambulatory Visit: Payer: 59 | Admitting: Family Medicine

## 2012-05-19 ENCOUNTER — Encounter: Payer: Self-pay | Admitting: Family Medicine

## 2012-05-19 VITALS — BP 120/60 | Wt 194.0 lb

## 2012-05-19 DIAGNOSIS — B3731 Acute candidiasis of vulva and vagina: Secondary | ICD-10-CM

## 2012-05-19 DIAGNOSIS — Z139 Encounter for screening, unspecified: Secondary | ICD-10-CM

## 2012-05-19 DIAGNOSIS — Z113 Encounter for screening for infections with a predominantly sexual mode of transmission: Secondary | ICD-10-CM

## 2012-05-19 DIAGNOSIS — B373 Candidiasis of vulva and vagina: Secondary | ICD-10-CM

## 2012-05-19 DIAGNOSIS — N898 Other specified noninflammatory disorders of vagina: Secondary | ICD-10-CM

## 2012-05-19 DIAGNOSIS — IMO0001 Reserved for inherently not codable concepts without codable children: Secondary | ICD-10-CM

## 2012-05-19 LAB — POCT URINALYSIS DIPSTICK
Bilirubin, UA: NEGATIVE
Blood, UA: NEGATIVE
Glucose, UA: NEGATIVE
Ketones, UA: NEGATIVE
Leukocytes, UA: NEGATIVE
Nitrite, UA: NEGATIVE
Protein, UA: NEGATIVE
Spec Grav, UA: <=1.005
Urobilinogen, UA: NEGATIVE
pH, UA: 6

## 2012-05-19 LAB — POCT WET PREP (WET MOUNT)
Bacteria Wet Prep HPF POC: NEGATIVE
Clue Cells Wet Prep Whiff POC: POSITIVE
Trichomonas Wet Prep HPF POC: NEGATIVE
WBC, Wet Prep HPF POC: NEGATIVE
pH: 5

## 2012-05-19 MED ORDER — FLUCONAZOLE 100 MG PO TABS: 150.0000 mg | ORAL_TABLET | Freq: Once | ORAL | Status: DC

## 2012-05-19 NOTE — Progress Notes (Signed)
Pt c/o vaginitis Vaginal Discharge/Discomfort/Itching  Subjective:   [name] is an 52 y.o. woman who presents c/o     Vaginal discharge: white and thickcurdy   Itching / Burning: yes  Symptoms have been present for 6 days. Has used over-the-counter treatment: yes Associated symptoms:  Pelvic pain: no       Dyspareunia: no     Odor:  yes  STD screen:done       Objective: Pelvic Exam: Uterus/Cervix/Ovaries: surgically removed Vaginal discharge: large amount of white, thick, malodorous discharge. Vaginal lesions:  none  Wet prep: vaginal pH is 5.5, psotive for yeast OSOM BV: not done OSOM Trichomonas:  not done Urinalysis:  negative, sent for culture  Assessment: Candidiasis:   Plan: Additional Tests:none indicated  Medications: Patient don't want to finish monistat, will try Diflucan 150mg  PO x 1 today and repeat in 4 days if no improvements. Counseling:  follow up plan   Follow-up: prn  Elane Fritz, FNP-BC 05/19/2012 2:10 PM

## 2012-05-20 LAB — GC/CHLAMYDIA PROBE AMP
CT Probe RNA: NEGATIVE
GC Probe RNA: NEGATIVE

## 2012-05-21 ENCOUNTER — Other Ambulatory Visit: Payer: Self-pay | Admitting: Family Medicine

## 2012-05-21 ENCOUNTER — Telehealth: Payer: Self-pay | Admitting: Family Medicine

## 2012-05-21 LAB — URINE CULTURE: Colony Count: >=100000

## 2012-05-21 MED ORDER — CIPROFLOXACIN HCL 250 MG PO TABS: 250.0000 mg | ORAL_TABLET | Freq: Two times a day (BID) | ORAL | Status: AC

## 2012-05-21 MED ORDER — FLUCONAZOLE 150 MG PO TABS: ORAL_TABLET | ORAL | Status: DC

## 2012-05-21 NOTE — Telephone Encounter (Signed)
Patient notified Cipro and correction for Diflucan sent to the pharmacy. L.Cecelia Graciano, FNP-BC

## 2012-05-26 ENCOUNTER — Encounter: Payer: 59 | Admitting: Obstetrics and Gynecology

## 2012-07-01 ENCOUNTER — Ambulatory Visit: Payer: PRIVATE HEALTH INSURANCE | Attending: Specialist

## 2012-07-01 DIAGNOSIS — R5381 Other malaise: Secondary | ICD-10-CM | POA: Insufficient documentation

## 2012-07-01 DIAGNOSIS — M545 Low back pain, unspecified: Secondary | ICD-10-CM | POA: Insufficient documentation

## 2012-07-01 DIAGNOSIS — IMO0001 Reserved for inherently not codable concepts without codable children: Secondary | ICD-10-CM | POA: Insufficient documentation

## 2012-07-07 ENCOUNTER — Ambulatory Visit: Payer: PRIVATE HEALTH INSURANCE | Attending: Specialist

## 2012-07-07 DIAGNOSIS — M545 Low back pain, unspecified: Secondary | ICD-10-CM | POA: Insufficient documentation

## 2012-07-07 DIAGNOSIS — IMO0001 Reserved for inherently not codable concepts without codable children: Secondary | ICD-10-CM | POA: Insufficient documentation

## 2012-07-07 DIAGNOSIS — R5381 Other malaise: Secondary | ICD-10-CM | POA: Insufficient documentation

## 2012-07-10 ENCOUNTER — Encounter (HOSPITAL_COMMUNITY): Payer: Self-pay | Admitting: *Deleted

## 2012-07-10 ENCOUNTER — Emergency Department (HOSPITAL_COMMUNITY)
Admission: EM | Admit: 2012-07-10 | Discharge: 2012-07-10 | Disposition: A | Discharge status: Home / Self Care | Payer: Self-pay | Source: Home / Self Care | Attending: Emergency Medicine | Admitting: Emergency Medicine

## 2012-07-10 ENCOUNTER — Ambulatory Visit: Payer: 59

## 2012-07-10 DIAGNOSIS — G609 Hereditary and idiopathic neuropathy, unspecified: Secondary | ICD-10-CM

## 2012-07-10 DIAGNOSIS — G629 Polyneuropathy, unspecified: Secondary | ICD-10-CM

## 2012-07-10 LAB — POCT I-STAT, CHEM 8
BUN: 8 mg/dL (ref 6–23)
Calcium, Ion: 1.28 mmol/L — ABNORMAL HIGH (ref 1.12–1.23)
Chloride: 103 meq/L (ref 96–112)
Creatinine, Ser: 0.9 mg/dL (ref 0.50–1.10)
Glucose, Bld: 100 mg/dL — ABNORMAL HIGH (ref 70–99)
HCT: 41 % (ref 36.0–46.0)
Hemoglobin: 13.9 g/dL (ref 12.0–15.0)
Potassium: 3.3 meq/L — ABNORMAL LOW (ref 3.5–5.1)
Sodium: 141 meq/L (ref 135–145)
TCO2: 29 mmol/L (ref 0–100)

## 2012-07-10 LAB — D-DIMER, QUANTITATIVE: D-Dimer, Quant: <0.27 ug{FEU}/mL (ref 0.00–0.48)

## 2012-07-10 MED ORDER — HYDROCODONE-ACETAMINOPHEN 5-325 MG PO TABS: ORAL_TABLET | ORAL | Status: DC

## 2012-07-10 MED ORDER — IBUPROFEN 800 MG PO TABS
ORAL_TABLET | ORAL | Status: AC
  Filled 2012-07-10: qty 1

## 2012-07-10 MED ORDER — IBUPROFEN 800 MG PO TABS
800.0000 mg | ORAL_TABLET | Freq: Once | ORAL | Status: AC
  Administered 2012-07-10: 800 mg via ORAL

## 2012-07-10 MED ORDER — GABAPENTIN 300 MG PO CAPS: ORAL_CAPSULE | ORAL | Status: DC

## 2012-07-10 MED ORDER — AMITRIPTYLINE HCL 25 MG PO TABS: 25.0000 mg | ORAL_TABLET | Freq: Every day | ORAL | Status: DC

## 2012-07-10 NOTE — ED Notes (Signed)
Bilateral Calf pain x 1 month - right started first - burning , aching pain. Right started first. No known injury - no new activity.  Seen at [facility] for right foot pain last week ( Pain x 2 months). Ambulates without assistance.

## 2012-07-10 NOTE — ED Provider Notes (Signed)
Chief Complaint:   Chief Complaint  Patient presents with  . Leg Pain    History of Present Illness:   [name] is a 52 year old female who had a three-week history of aching in both of her legs. The patient states her calves and feet feel burning, throbbing, tingling, hurt, and feel numb. This is worse if she is on her feet for long periods of time, but also with sitting. Her ankles sometimes swell. She's concerned about DVT since there is a family history of blood clots. She's had back problems and has seen Dr. Jillyn Hidden for this. She's been given injections and physical therapy. She also has a history of prediabetes. Her last A1c level was 6.0%. She feels tired and rundown has had some polyuria and polydipsia and she has seen a foot specialist for this and gotten and alcohol injection in her heel. She has no history of vitamin B deficiency. She has no personal history of DVT or pulmonary embolus.  Review of Systems:  Other than noted above, the patient denies any of the following symptoms: Systemic:  No fever, chills, sweats, weight gain or loss. Respiratory:  No coughing, wheezing, or shortness of breath. Cardiac:  No chest pain, tightness, pressure or syncope. GI:  No abdominal pain, swelling, distension, nausea, or vomiting. GU:  No dysuria, frequency, or hematuria. Ext:  No joint pain, muscle pain, or weakness. Skin:  No rash or itching. Neuro:  No paresthesias.  PMFSH:  Past medical history, family history, social history, meds, and allergies were reviewed.  She is allergic to sulfa. She takes hydrocodone for back pain in his condyle she has chronic lower back pain and migraines.  Physical Exam:   Vital signs:  BP 138/82  Pulse 76  Temp(Src) 98.2 F (36.8 C) (Oral)  SpO2 98% Gen:  Alert, oriented, in no distress. Neck:  No tenderness, adenopathy, or JVD. Lungs:  Breath sounds clear and equal bilaterally.  No rales, rhonchi or wheezes. Heart:  Regular rhythm, no gallops or  murmers. Abdomen:  Soft, nontender, no organomegaly or mass. Ext:  No pitting ankle or pedal edema. No distended leg veins. She has bilateral calf tenderness and positive Homans sign. There is no joint pain to palpation, pulses are full, and she has good capillary refill in her toes. Neuro:  Alert and oriented times 3.  No muscle weakness.  Sensation intact to light touch. Skin:  Warm and dry.  No rash or skin lesions.  Labs:   Results for orders placed during the hospital encounter of 07/10/12  D-DIMER, QUANTITATIVE      Result Value Range   D-Dimer, Quant <0.27  0.00 - 0.48 ug/mL-FEU  POCT I-STAT, CHEM 8      Result Value Range   Sodium 141  135 - 145 mEq/L   Potassium 3.3 (*) 3.5 - 5.1 mEq/L   Chloride 103  96 - 112 mEq/L   BUN 8  6 - 23 mg/dL   Creatinine, Ser 1.19  0.50 - 1.10 mg/dL   Glucose, Bld 147 (*) 70 - 99 mg/dL   Calcium, Ion 8.29 (*) 1.12 - 1.23 mmol/L   TCO2 29  0 - 100 mmol/L   Hemoglobin 13.9  12.0 - 15.0 g/dL   HCT 56.2  13.0 - 86.5 %    Course in Urgent Care Center:  She was given ibuprofen for her pain so she will be driving home from here. She did get some relief with this.  Assessment:  The  encounter diagnosis was Peripheral neuropathy.  Differential diagnosis is very wide including diabetes or prediabetes, vitamin deficiency, idiopathic, or nerve damage secondary to back problems. She'll need a complete workup including repeat A1c, vitamin B levels, nerve conduction studies, and possibly an MRI of her spine. She was referred to Dr. Porfirio Mylar Dohmeier for this.  Plan:   1.  The following meds were prescribed:   Discharge Medication List as of 07/10/2012  2:13 PM    START taking these medications   Details  amitriptyline (ELAVIL) 25 MG tablet Take 1 tablet (25 mg total) by mouth at bedtime., Starting 07/10/2012, Until Discontinued, Normal    gabapentin (NEURONTIN) 300 MG capsule 1 daily for 3 days, 1 BID for 3 days, 1 TID thereafter, Normal     HYDROcodone-acetaminophen (NORCO/VICODIN) 5-325 MG per tablet 1 to 2 tabs every 4 to 6 hours as needed for pain., Print       2.  The patient was instructed in symptomatic care and handouts were given. 3.  The patient was told to return if becoming worse in any way, if no better in 3 or 4 days, and given some red flag symptoms such as worsening pain or neurological symptoms that would indicate earlier return.    Reuben Likes, MD 07/10/12 [phone]

## 2012-07-14 ENCOUNTER — Ambulatory Visit: Payer: 59 | Attending: Specialist

## 2012-07-17 ENCOUNTER — Ambulatory Visit: Payer: PRIVATE HEALTH INSURANCE | Attending: Specialist

## 2012-07-17 DIAGNOSIS — IMO0001 Reserved for inherently not codable concepts without codable children: Secondary | ICD-10-CM | POA: Insufficient documentation

## 2012-07-17 DIAGNOSIS — R5381 Other malaise: Secondary | ICD-10-CM | POA: Diagnosis not present

## 2012-07-17 DIAGNOSIS — M545 Low back pain, unspecified: Secondary | ICD-10-CM | POA: Insufficient documentation

## 2012-07-19 ENCOUNTER — Emergency Department (HOSPITAL_COMMUNITY)
Admission: EM | Admit: 2012-07-19 | Discharge: 2012-07-19 | Disposition: A | Discharge status: Home / Self Care | Payer: PRIVATE HEALTH INSURANCE | Attending: Emergency Medicine | Admitting: Emergency Medicine

## 2012-07-19 ENCOUNTER — Encounter (HOSPITAL_COMMUNITY): Payer: Self-pay | Admitting: *Deleted

## 2012-07-19 DIAGNOSIS — M545 Low back pain, unspecified: Secondary | ICD-10-CM | POA: Insufficient documentation

## 2012-07-19 DIAGNOSIS — Z8669 Personal history of other diseases of the nervous system and sense organs: Secondary | ICD-10-CM | POA: Insufficient documentation

## 2012-07-19 DIAGNOSIS — Z79899 Other long term (current) drug therapy: Secondary | ICD-10-CM | POA: Insufficient documentation

## 2012-07-19 DIAGNOSIS — Z8739 Personal history of other diseases of the musculoskeletal system and connective tissue: Secondary | ICD-10-CM | POA: Insufficient documentation

## 2012-07-19 DIAGNOSIS — Z8679 Personal history of other diseases of the circulatory system: Secondary | ICD-10-CM | POA: Insufficient documentation

## 2012-07-19 DIAGNOSIS — F17200 Nicotine dependence, unspecified, uncomplicated: Secondary | ICD-10-CM | POA: Insufficient documentation

## 2012-07-19 DIAGNOSIS — Z8619 Personal history of other infectious and parasitic diseases: Secondary | ICD-10-CM | POA: Insufficient documentation

## 2012-07-19 DIAGNOSIS — M549 Dorsalgia, unspecified: Secondary | ICD-10-CM

## 2012-07-19 DIAGNOSIS — Z8742 Personal history of other diseases of the female genital tract: Secondary | ICD-10-CM | POA: Insufficient documentation

## 2012-07-19 MED ORDER — OXYCODONE-ACETAMINOPHEN 5-325 MG PO TABS
1.0000 | ORAL_TABLET | ORAL | Status: DC | PRN
Start: 2012-07-19 — End: 2012-09-04

## 2012-07-19 MED ORDER — HYDROMORPHONE HCL PF 2 MG/ML IJ SOLN: 2.0000 mg | Freq: Once | INTRAMUSCULAR | Status: DC

## 2012-07-19 MED ORDER — HYDROMORPHONE HCL PF 2 MG/ML IJ SOLN
2.0000 mg | Freq: Once | INTRAMUSCULAR | Status: AC
  Administered 2012-07-19: 2 mg via INTRAMUSCULAR
  Filled 2012-07-19: qty 1

## 2012-07-19 NOTE — ED Notes (Signed)
Dr. Campos at bedside   

## 2012-07-19 NOTE — ED Notes (Signed)
Pt. States feeling better, resting in bed.

## 2012-07-19 NOTE — ED Notes (Addendum)
Lower back, radiating down rt. Leg. Rt. Flank pain. Went to urgent care last Thursday, and dr. Lisabeth Devoid nerve damage. Appt. With neurologists on Monday.

## 2012-07-19 NOTE — ED Provider Notes (Signed)
History     CSN: [id number]  Arrival date & time 07/19/12  1925   First MD Initiated Contact with Patient 07/19/12 2006      Chief Complaint  Patient presents with  . Back Pain    The history is provided by the patient.   the patient reports she's been having right low back pain as well as pain radiating down her right buttock and into her right posterior thigh.  This been occurring for several weeks.  She seen orthopedic surgeon about this without a known etiology.  She's had an MRI scan which demonstrates no spinal cord or neural foramina involvement or narrowing.  Her orthopedic surgeon does not believe there is anything mechanical.  She's received several injections by one the pain specialist without improvement in her symptoms.  They believe her pain is likely neuropathic in her for her onto neurology.  She's been on Neurontin and some pain medicine without improvement in her symptoms.  She denies any weakness in her lower extremities.  No numbness or tingling.  She's tried some narcotic medications in the past and they just make her feel tired and sleepy but don't actually resolve her pain as she wakes up in continues to have the same pain.  Nothing worsens or improves her pain.  Pain is constant.  Past Medical History  Diagnosis Date  . Borderline diabetic   . Weight gain   . Sleep deprivation     loss of sleep  . Fatigue   . Shortness of breath     with pain   . Nausea   . Blurred vision   . Wears glasses   . Arthritis   . Migraine headache   . History of measles, mumps, or rubella   . H/O varicella   . Hx of ovarian cyst   . Yeast infection   . H/O bacterial infection   . Back pain     Past Surgical History  Procedure Laterality Date  . Abdominal hysterectomy    . Oophorectomy  2009  . Cholecystectomy  11/13/10  . Dilation and curettage of uterus    . Tubal ligation      Family History  Problem Relation Age of Onset  . Hypertension Mother   . Hypotension  Mother   . Anemia Mother     low iron  . Heart disease Sister   . Sickle cell trait Other     History  Substance Use Topics  . Smoking status: Current Some Day Smoker -- 0.50 packs/day    Types: Cigarettes  . Smokeless tobacco: Never Used  . Alcohol Use: 1.2 oz/week    2 Glasses of wine per week     Comment: socially     OB History   Grav Para Term Preterm Abortions TAB SAB Ect Mult Living   3 2 2  1     2       Review of Systems  Musculoskeletal: Positive for back pain.  All other systems reviewed and are negative.    Allergies  Sulfa antibiotics  Home Medications   Current Outpatient Rx  Name  Route  Sig  Dispense  Refill  . almotriptan (AXERT) 6.25 MG tablet   Oral   Take 6.25 mg by mouth as needed for migraine (Unsure of dosage). may repeat in 2 hours if needed         . amitriptyline (ELAVIL) 25 MG tablet   Oral   Take 1 tablet (25 mg total)  by mouth at bedtime.   30 tablet   0   . cyclobenzaprine (FLEXERIL) 10 MG tablet   Oral   Take 5-10 mg by mouth 3 (three) times daily as needed. For muscle spasms         . diazepam (VALIUM) 5 MG tablet   Oral   Take 1 tablet (5 mg total) by mouth every 6 (six) hours as needed for anxiety.   12 tablet   0   . estradiol (ESTRACE) 2 MG tablet   Oral   Take 1 tablet (2 mg total) by mouth daily.   30 tablet   6   . estrogens, conjugated, (PREMARIN) 0.3 MG tablet   Oral   Take 0.3 mg by mouth daily. Take daily for 21 days then do not take for 7 days.         . fluconazole (DIFLUCAN) 150 MG tablet      Take 1 tablet today and may repeat in 4 days.   2 tablet   1   . gabapentin (NEURONTIN) 300 MG capsule      1 daily for 3 days, 1 BID for 3 days, 1 TID thereafter   90 capsule   0   . HYDROcodone-acetaminophen (NORCO) 7.5-325 MG per tablet   Oral   Take 1 tablet by mouth every 6 (six) hours as needed. For pain         . HYDROcodone-acetaminophen (NORCO/VICODIN) 5-325 MG per tablet      1 to 2  tabs every 4 to 6 hours as needed for pain.   20 tablet   0   . ibuprofen (ADVIL,MOTRIN) 600 MG tablet   Oral   Take 1 tablet (600 mg total) by mouth every 6 (six) hours as needed for pain.   30 tablet   0   . isometheptene-acetaminophen-dichloralphenazone (MIDRIN) 65-325-100 MG capsule      Take 2 at onset of headache and 1 every hour thereafter as needed up to 5 per day   30 capsule   2   . oxyCODONE-acetaminophen (PERCOCET/ROXICET) 5-325 MG per tablet   Oral   Take 1 tablet by mouth every 8 (eight) hours as needed for pain.   12 tablet   0     For sever pain   . oxyCODONE-acetaminophen (PERCOCET/ROXICET) 5-325 MG per tablet   Oral   Take 1 tablet by mouth every 4 (four) hours as needed for pain.   15 tablet   0   . PredniSONE 5 MG KIT   Oral   Take 1 kit (5 mg total) by mouth daily after breakfast. Prednisone 5 mg 6 day dosepack.  Take as directed.   1 kit   0   . triamcinolone cream (KENALOG) 0.1 %   Topical   Apply topically 3 (three) times daily.   454 g   0     BP 133/97  Pulse 80  Temp(Src) 98 F (36.7 C) (Oral)  Resp 14  SpO2 98%  Physical Exam  Nursing note and vitals reviewed. Constitutional: She is oriented to person, place, and time. She appears well-developed and well-nourished. No distress.  HENT:  Head: Normocephalic and atraumatic.  Eyes: EOM are normal.  Neck: Normal range of motion.  Cardiovascular: Normal rate, regular rhythm and normal heart sounds.   Pulmonary/Chest: Effort normal and breath sounds normal.  Abdominal: Soft. She exhibits no distension. There is no tenderness.  Musculoskeletal: Normal range of motion.  Tenderness in right sciatic notch  and right paralumbar muscles.  Neurological: She is alert and oriented to person, place, and time.  Skin: Skin is warm and dry.  Psychiatric: She has a normal mood and affect. Judgment normal.    ED Course  Procedures (including critical care time)  Labs Reviewed - No data to  display No results found.   1. Back pain       MDM  Normal strength.  This appears to be a right-sided sciatica-like symptoms.  Her MRI however demonstrates no involvement of those nerve roots.  Her pain is likely more neuropathic.  Pain improved with pain medicine.  She is scheduled to see neurology.        Lyanne Co, MD 07/19/12 2110

## 2012-07-21 ENCOUNTER — Ambulatory Visit (INDEPENDENT_AMBULATORY_CARE_PROVIDER_SITE_OTHER): Payer: 59 | Admitting: Neurology

## 2012-07-21 ENCOUNTER — Encounter: Payer: Self-pay | Admitting: Neurology

## 2012-07-21 VITALS — BP 131/88 | HR 90 | Ht 65.0 in | Wt 196.0 lb

## 2012-07-21 DIAGNOSIS — U099 Post covid-19 condition, unspecified: Secondary | ICD-10-CM | POA: Insufficient documentation

## 2012-07-21 DIAGNOSIS — R0602 Shortness of breath: Secondary | ICD-10-CM | POA: Insufficient documentation

## 2012-07-21 DIAGNOSIS — G43909 Migraine, unspecified, not intractable, without status migrainosus: Secondary | ICD-10-CM

## 2012-07-21 DIAGNOSIS — M1990 Unspecified osteoarthritis, unspecified site: Secondary | ICD-10-CM | POA: Insufficient documentation

## 2012-07-21 DIAGNOSIS — B379 Candidiasis, unspecified: Secondary | ICD-10-CM | POA: Insufficient documentation

## 2012-07-21 DIAGNOSIS — R112 Nausea with vomiting, unspecified: Secondary | ICD-10-CM | POA: Insufficient documentation

## 2012-07-21 DIAGNOSIS — Z973 Presence of spectacles and contact lenses: Secondary | ICD-10-CM | POA: Insufficient documentation

## 2012-07-21 DIAGNOSIS — R5383 Other fatigue: Secondary | ICD-10-CM | POA: Insufficient documentation

## 2012-07-21 DIAGNOSIS — R635 Abnormal weight gain: Secondary | ICD-10-CM | POA: Insufficient documentation

## 2012-07-21 DIAGNOSIS — R110 Nausea: Secondary | ICD-10-CM

## 2012-07-21 DIAGNOSIS — Z8742 Personal history of other diseases of the female genital tract: Secondary | ICD-10-CM | POA: Insufficient documentation

## 2012-07-21 DIAGNOSIS — M549 Dorsalgia, unspecified: Secondary | ICD-10-CM | POA: Insufficient documentation

## 2012-07-21 DIAGNOSIS — M129 Arthropathy, unspecified: Secondary | ICD-10-CM

## 2012-07-21 DIAGNOSIS — H538 Other visual disturbances: Secondary | ICD-10-CM

## 2012-07-21 DIAGNOSIS — Z72820 Sleep deprivation: Secondary | ICD-10-CM | POA: Insufficient documentation

## 2012-07-21 DIAGNOSIS — Z8619 Personal history of other infectious and parasitic diseases: Secondary | ICD-10-CM | POA: Insufficient documentation

## 2012-07-21 DIAGNOSIS — Z87898 Personal history of other specified conditions: Secondary | ICD-10-CM

## 2012-07-21 DIAGNOSIS — R7303 Prediabetes: Secondary | ICD-10-CM | POA: Insufficient documentation

## 2012-07-21 NOTE — Progress Notes (Signed)
History: [name] is a 52 years old right-handed African American female, referred by her primary care physician for evaluation of low back pain  She has past medical history of migraine headaches, works on third shift, she fell in September 2013, her leg was caught up in the IV cord, she landed on her left knee, she complained of left knee pain, right-sided low back pain, radiating pain to her right leg, right lateral thigh, she was seen by orthopedic surgeon Dr. Jillyn Hidden, MRI of pelvic was normal, MRI of lumbar showed right L5-S1 arthropathy,  Despite epidural injection, physical therapy, she continued to have right-sided low back pain, shooting pain along right lateral leg, also developed right more than left foot paresthesia, burning stinging sensation at the bottom of her feet,  She has mild gait limitation due to pain, no bowel and bladder incontinence.  She has tried gabapentin, amitriptyline, hydrocodone, Mobic, the medicine relieve the symptoms, but causing sleepiness.  Review of Systems  Out of a complete 14 system review, the patient complains of only the following symptoms, and all other reviewed systems are negative.   Constitutional:   N/A Cardiovascular:  N/A Ear/Nose/Throat:  N/A Skin: N/A Eyes: N/A Respiratory: N/A Gastroitestinal: N/A    Hematology/Lymphatic:  N/A Endocrine:  N/A Musculoskeletal: Joint pain, joint swelling, cramps, achy muscles Allergy/Immunology: N/A Neurological: Numbness, weakness, insomnia Psychiatric:     Not enough sleep.  PHYSICAL EXAMINATOINS:  Generalized: In no acute distress  Neck: Supple, no carotid bruits   Cardiac: Regular rate rhythm  Pulmonary: Clear to auscultation bilaterally  Musculoskeletal: No deformity  Neurological examination  Mentation: Alert oriented to time, place, history taking, and causual conversation  Cranial nerve II-XII: Pupils were equal round reactive to light extraocular movements were full, visual field  were full on confrontational test. facial sensation and strength were normal. hearing was intact to finger rubbing bilaterally. Uvula tongue midline.  head turning and shoulder shrug and were normal and symmetric.Tongue protrusion into cheek strength was normal.  Motor: normal tone, bulk and strength.  Sensory: Intact to fine touch, pinprick, preserved vibratory sensation, and proprioception at toes.  Coordination: Normal finger to nose, heel-to-shin bilaterally there was no truncal ataxia  Gait: Rising up from seated position without assistance, normal stance, without trunk ataxia, moderate stride, good arm swing, smooth turning, able to perform tiptoe, and heel walking without difficulty.   Romberg signs: Negative  Deep tendon reflexes: Brachioradialis 2/2, biceps 2/2, triceps 2/2, patellar 2/2, Achilles 2/2, plantar responses were flexor bilaterally.  Assessment and plan:  52 years old right-handed [place] American female, with right-sided low back pain, radiating pain to right leg, bilateral feet paresthesia, essentially normal examination,   1. EMG nerve conduction study to evaluation for peripheral neuropathy. Right lumbar radiculopathy 2 continue hot compression, physical therapy, Mobic,.

## 2012-07-22 ENCOUNTER — Ambulatory Visit: Payer: PRIVATE HEALTH INSURANCE | Admitting: Physical Therapy

## 2012-07-22 DIAGNOSIS — IMO0001 Reserved for inherently not codable concepts without codable children: Secondary | ICD-10-CM | POA: Diagnosis not present

## 2012-07-24 ENCOUNTER — Ambulatory Visit: Payer: PRIVATE HEALTH INSURANCE | Admitting: Physical Therapy

## 2012-07-28 ENCOUNTER — Ambulatory Visit: Payer: PRIVATE HEALTH INSURANCE

## 2012-07-29 ENCOUNTER — Encounter (INDEPENDENT_AMBULATORY_CARE_PROVIDER_SITE_OTHER): Payer: 59

## 2012-07-29 ENCOUNTER — Ambulatory Visit (INDEPENDENT_AMBULATORY_CARE_PROVIDER_SITE_OTHER): Payer: 59 | Admitting: Neurology

## 2012-07-29 DIAGNOSIS — M79609 Pain in unspecified limb: Secondary | ICD-10-CM

## 2012-07-29 DIAGNOSIS — H538 Other visual disturbances: Secondary | ICD-10-CM

## 2012-07-29 DIAGNOSIS — Z973 Presence of spectacles and contact lenses: Secondary | ICD-10-CM

## 2012-07-29 DIAGNOSIS — R5383 Other fatigue: Secondary | ICD-10-CM

## 2012-07-29 DIAGNOSIS — Z8619 Personal history of other infectious and parasitic diseases: Secondary | ICD-10-CM

## 2012-07-29 DIAGNOSIS — G43909 Migraine, unspecified, not intractable, without status migrainosus: Secondary | ICD-10-CM

## 2012-07-29 DIAGNOSIS — R0602 Shortness of breath: Secondary | ICD-10-CM

## 2012-07-29 DIAGNOSIS — R635 Abnormal weight gain: Secondary | ICD-10-CM

## 2012-07-29 DIAGNOSIS — M549 Dorsalgia, unspecified: Secondary | ICD-10-CM

## 2012-07-29 DIAGNOSIS — Z87898 Personal history of other specified conditions: Secondary | ICD-10-CM

## 2012-07-29 DIAGNOSIS — Z72820 Sleep deprivation: Secondary | ICD-10-CM

## 2012-07-29 DIAGNOSIS — Z8742 Personal history of other diseases of the female genital tract: Secondary | ICD-10-CM

## 2012-07-29 DIAGNOSIS — M1990 Unspecified osteoarthritis, unspecified site: Secondary | ICD-10-CM

## 2012-07-29 DIAGNOSIS — R110 Nausea: Secondary | ICD-10-CM

## 2012-07-29 DIAGNOSIS — R7303 Prediabetes: Secondary | ICD-10-CM

## 2012-07-29 DIAGNOSIS — B379 Candidiasis, unspecified: Secondary | ICD-10-CM

## 2012-07-29 DIAGNOSIS — Z0289 Encounter for other administrative examinations: Secondary | ICD-10-CM

## 2012-07-29 DIAGNOSIS — R209 Unspecified disturbances of skin sensation: Secondary | ICD-10-CM

## 2012-07-29 NOTE — Procedures (Signed)
History: 52 years old female with right-sided low back pain, radiating pain to right lower extremity,  On examination:Bilateral lower extremity motor strength was normal, deep tendon reflexes were normal and symmetric   Nerve conduction study: Bilateral peroneal sensory responses were normal. Bilateral peroneal to EDB, and tibial motor responses were normal. Bilateral tibial H. reflexes were normal and symmetric  Electromyography:  Selected needle examination was performed at right lower extremity muscles, and right lumbosacral paraspinal muscles.  Needle examination of right tibialis anterior, tibialis posterior, medial gastrocnemius, peroneal longus, vastus lateralis, gluteus medium was normal.  There was no spontaneous activity at right lumbosacral paraspinal muscles, right L4, 5, S1.   IN CONCLUSION: This is a normal study. There is no electrodiagnostic evidence of large fiber peripheral neuropathy, right lower extremity neuropathy, or right lumbosacral radiculopathy.

## 2012-08-05 ENCOUNTER — Ambulatory Visit: Payer: PRIVATE HEALTH INSURANCE

## 2012-08-08 ENCOUNTER — Ambulatory Visit: Payer: PRIVATE HEALTH INSURANCE

## 2012-08-13 ENCOUNTER — Ambulatory Visit: Payer: PRIVATE HEALTH INSURANCE

## 2012-08-13 DIAGNOSIS — IMO0001 Reserved for inherently not codable concepts without codable children: Secondary | ICD-10-CM | POA: Diagnosis not present

## 2012-08-15 ENCOUNTER — Ambulatory Visit: Payer: PRIVATE HEALTH INSURANCE

## 2012-08-18 ENCOUNTER — Ambulatory Visit: Payer: 59 | Attending: Specialist | Admitting: Physical Therapy

## 2012-08-18 DIAGNOSIS — IMO0001 Reserved for inherently not codable concepts without codable children: Secondary | ICD-10-CM | POA: Insufficient documentation

## 2012-08-18 DIAGNOSIS — M545 Low back pain, unspecified: Secondary | ICD-10-CM | POA: Insufficient documentation

## 2012-08-18 DIAGNOSIS — R5381 Other malaise: Secondary | ICD-10-CM | POA: Insufficient documentation

## 2012-09-01 ENCOUNTER — Inpatient Hospital Stay (HOSPITAL_COMMUNITY)
Admission: RE | Admit: 2012-09-01 | Discharge: 2012-09-04 | DRG: 885 | Disposition: A | Discharge status: Home / Self Care | Payer: 59 | Attending: Psychiatry | Admitting: Psychiatry

## 2012-09-01 ENCOUNTER — Encounter (HOSPITAL_COMMUNITY): Payer: Self-pay | Admitting: *Deleted

## 2012-09-01 DIAGNOSIS — H538 Other visual disturbances: Secondary | ICD-10-CM

## 2012-09-01 DIAGNOSIS — M549 Dorsalgia, unspecified: Secondary | ICD-10-CM

## 2012-09-01 DIAGNOSIS — Z8742 Personal history of other diseases of the female genital tract: Secondary | ICD-10-CM

## 2012-09-01 DIAGNOSIS — Z87898 Personal history of other specified conditions: Secondary | ICD-10-CM

## 2012-09-01 DIAGNOSIS — Z8619 Personal history of other infectious and parasitic diseases: Secondary | ICD-10-CM

## 2012-09-01 DIAGNOSIS — R7303 Prediabetes: Secondary | ICD-10-CM

## 2012-09-01 DIAGNOSIS — G43909 Migraine, unspecified, not intractable, without status migrainosus: Secondary | ICD-10-CM

## 2012-09-01 DIAGNOSIS — R079 Chest pain, unspecified: Secondary | ICD-10-CM

## 2012-09-01 DIAGNOSIS — R45851 Suicidal ideations: Secondary | ICD-10-CM

## 2012-09-01 DIAGNOSIS — B379 Candidiasis, unspecified: Secondary | ICD-10-CM

## 2012-09-01 DIAGNOSIS — M1990 Unspecified osteoarthritis, unspecified site: Secondary | ICD-10-CM

## 2012-09-01 DIAGNOSIS — Z973 Presence of spectacles and contact lenses: Secondary | ICD-10-CM

## 2012-09-01 DIAGNOSIS — F322 Major depressive disorder, single episode, severe without psychotic features: Secondary | ICD-10-CM

## 2012-09-01 DIAGNOSIS — N951 Menopausal and female climacteric states: Secondary | ICD-10-CM

## 2012-09-01 DIAGNOSIS — R0602 Shortness of breath: Secondary | ICD-10-CM

## 2012-09-01 DIAGNOSIS — F332 Major depressive disorder, recurrent severe without psychotic features: Principal | ICD-10-CM | POA: Diagnosis present

## 2012-09-01 DIAGNOSIS — F4310 Post-traumatic stress disorder, unspecified: Secondary | ICD-10-CM | POA: Diagnosis present

## 2012-09-01 DIAGNOSIS — R110 Nausea: Secondary | ICD-10-CM

## 2012-09-01 DIAGNOSIS — Z72820 Sleep deprivation: Secondary | ICD-10-CM

## 2012-09-01 DIAGNOSIS — R5383 Other fatigue: Secondary | ICD-10-CM

## 2012-09-01 DIAGNOSIS — E109 Type 1 diabetes mellitus without complications: Secondary | ICD-10-CM

## 2012-09-01 DIAGNOSIS — R635 Abnormal weight gain: Secondary | ICD-10-CM

## 2012-09-01 HISTORY — DX: Depression, unspecified: F32.A

## 2012-09-01 HISTORY — DX: Major depressive disorder, single episode, unspecified: F32.9

## 2012-09-01 LAB — COMPREHENSIVE METABOLIC PANEL
ALT: 10 U/L (ref 0–35)
AST: 13 U/L (ref 0–37)
Albumin: 3.3 g/dL — ABNORMAL LOW (ref 3.5–5.2)
Alkaline Phosphatase: 81 U/L (ref 39–117)
BUN: 7 mg/dL (ref 6–23)
CO2: 28 mEq/L (ref 19–32)
Calcium: 9.5 mg/dL (ref 8.4–10.5)
Chloride: 104 mEq/L (ref 96–112)
Creatinine, Ser: 0.88 mg/dL (ref 0.50–1.10)
GFR calc Af Amer: 87 mL/min — ABNORMAL LOW (ref >90)
GFR calc non Af Amer: 75 mL/min — ABNORMAL LOW (ref >90)
Glucose, Bld: 90 mg/dL (ref 70–99)
Potassium: 3.6 mEq/L (ref 3.5–5.1)
Sodium: 141 mEq/L (ref 135–145)
Total Bilirubin: 0.1 mg/dL — ABNORMAL LOW (ref 0.3–1.2)
Total Protein: 7.2 g/dL (ref 6.0–8.3)

## 2012-09-01 LAB — CBC
HCT: 35.5 % — ABNORMAL LOW (ref 36.0–46.0)
Hemoglobin: 11.6 g/dL — ABNORMAL LOW (ref 12.0–15.0)
MCH: 27.8 pg (ref 26.0–34.0)
MCHC: 32.7 g/dL (ref 30.0–36.0)
MCV: 85.1 fL (ref 78.0–100.0)
Platelets: 320 10*3/uL (ref 150–400)
RBC: 4.17 MIL/uL (ref 3.87–5.11)
RDW: 13.8 % (ref 11.5–15.5)
WBC: 7.1 10*3/uL (ref 4.0–10.5)

## 2012-09-01 LAB — ETHANOL: Alcohol, Ethyl (B): <11 mg/dL (ref 0–11)

## 2012-09-01 MED ORDER — AMITRIPTYLINE HCL 25 MG PO TABS
25.0000 mg | ORAL_TABLET | Freq: Every day | ORAL | Status: DC
  Administered 2012-09-01 – 2012-09-03 (×3): 25 mg via ORAL
  Filled 2012-09-01 (×3): qty 1
  Filled 2012-09-01: qty 3
  Filled 2012-09-01: qty 1
  Filled 2012-09-01: qty 3
  Filled 2012-09-01: qty 1

## 2012-09-01 MED ORDER — ALUM & MAG HYDROXIDE-SIMETH 200-200-20 MG/5ML PO SUSP: 30.0000 mL | ORAL | Status: DC | PRN

## 2012-09-01 MED ORDER — ACETAMINOPHEN 325 MG PO TABS: 650.0000 mg | ORAL_TABLET | Freq: Four times a day (QID) | ORAL | Status: DC | PRN

## 2012-09-01 MED ORDER — NON FORMULARY: 6.2500 mg | Status: DC

## 2012-09-01 MED ORDER — HYDROCODONE-ACETAMINOPHEN 5-325 MG PO TABS
1.0000 | ORAL_TABLET | Freq: Four times a day (QID) | ORAL | Status: DC | PRN
  Administered 2012-09-02: 1 via ORAL
  Filled 2012-09-01: qty 1

## 2012-09-01 MED ORDER — MAGNESIUM HYDROXIDE 400 MG/5ML PO SUSP: 30.0000 mL | Freq: Every day | ORAL | Status: DC | PRN

## 2012-09-01 MED ORDER — ALMOTRIPTAN MALATE 6.25 MG PO TABS: 6.2500 mg | ORAL_TABLET | Freq: Once | ORAL | Status: AC | PRN

## 2012-09-01 MED ORDER — GABAPENTIN 300 MG PO CAPS
300.0000 mg | ORAL_CAPSULE | Freq: Three times a day (TID) | ORAL | Status: DC
  Administered 2012-09-01 – 2012-09-04 (×9): 300 mg via ORAL
  Filled 2012-09-01 (×2): qty 1
  Filled 2012-09-01: qty 9
  Filled 2012-09-01 (×4): qty 1
  Filled 2012-09-01 (×3): qty 9
  Filled 2012-09-01 (×3): qty 1
  Filled 2012-09-01: qty 9
  Filled 2012-09-01 (×3): qty 1
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
## 2012-09-01 NOTE — Tx Team (Signed)
Initial Interdisciplinary Treatment Plan  PATIENT STRENGTHS: (choose at least two) Ability for insight Average or above average intelligence Communication skills Financial means Motivation for treatment/growth Religious Affiliation Supportive family/friends  PATIENT STRESSORS: Health problems   PROBLEM LIST: Problem List/Patient Goals Date to be addressed Date deferred Reason deferred Estimated date of resolution  SI      DM type I                                                 DISCHARGE CRITERIA:  Improved stabilization in mood, thinking, and/or behavior Motivation to continue treatment in a less acute level of care Need for constant or close observation no longer present  PRELIMINARY DISCHARGE PLAN: Outpatient therapy Participate in family therapy Return to previous living arrangement Return to previous work or school arrangements  PATIENT/FAMIILY INVOLVEMENT: This treatment plan has been presented to and reviewed with the patient, [name], and/or family member.  The patient and family have been given the opportunity to ask questions and make suggestions.  Mickeal Needy 09/01/2012, 8:14 PM

## 2012-09-01 NOTE — Progress Notes (Signed)
Patient ID: [name], female   DOB: 1960-07-30, 52 y.o.   MRN: [id number] Pt. Is a 52 yo female admitted for SI and depression, this is pt. First admission to [facility], with no other mental health admission. 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 Pt. Reports she took extra pill on Friday night and on Sunday night of OxyContin, Neuorontin, Elavil, I told my husband it took them, and if I wake up fine if I don't  It's okay"  Writer asks pt. If she was having SI she reports If I knew I was gonna end up here  I would have thought different" "Like I told my husband if you really want to hurt yourself you could" Pt. Currently contracts for safety. Pt. Reports" it's like you got the devil on one side and what good on the other and my faith is stronger than that to let the devil make me do something." Pt. Reports stressors on the job. Staff will monitor q46min for safety.

## 2012-09-02 DIAGNOSIS — F4310 Post-traumatic stress disorder, unspecified: Secondary | ICD-10-CM

## 2012-09-02 DIAGNOSIS — F332 Major depressive disorder, recurrent severe without psychotic features: Principal | ICD-10-CM

## 2012-09-02 LAB — TSH: TSH: 0.492 u[IU]/mL (ref 0.350–4.500)

## 2012-09-02 MED ORDER — ESCITALOPRAM OXALATE 10 MG PO TABS
10.0000 mg | ORAL_TABLET | Freq: Every day | ORAL | Status: DC
  Administered 2012-09-02 – 2012-09-04 (×3): 10 mg via ORAL
  Filled 2012-09-02 (×2): qty 1
  Filled 2012-09-02: qty 3
  Filled 2012-09-02 (×2): qty 1
  Filled 2012-09-02: qty 3
  Filled 2012-09-02: qty 1

## 2012-09-02 MED ORDER — FERROUS SULFATE 325 (65 FE) MG PO TABS
325.0000 mg | ORAL_TABLET | Freq: Every day | ORAL | Status: DC
  Administered 2012-09-03 – 2012-09-04 (×2): 325 mg via ORAL
  Filled 2012-09-02 (×4): qty 1

## 2012-09-02 MED ORDER — CLONAZEPAM 0.5 MG PO TABS: 0.5000 mg | ORAL_TABLET | Freq: Two times a day (BID) | ORAL | Status: DC | PRN

## 2012-09-02 NOTE — Progress Notes (Signed)
Grief and Loss Group  Group members discussed significant losses in their life and shared their grief and loss experiences.   [name] was present and attentive in group. [name] appeared tired and did not participate verbally. [name] left ten minutes before group ended.  Sherol Dade Counselor Intern Haroldine Laws

## 2012-09-02 NOTE — Progress Notes (Signed)
Patient ID: [name], female   DOB: [birth year], 52 y.o.   MRN: [id number] D-  Patient reports she feels groggy this am and "It will take me awhile to sleep off the medication I took last night."  Patient explains that she usually works the night shift so she will need awhile to adjust to day programming.  A- Encouraged patient to go to as many groups as she can. R- Patient did go to grief/loss group.  She is soft spoken, cooperative.

## 2012-09-02 NOTE — BHH Group Notes (Signed)
[facility] LCSW Aftercare Discharge Planning Group Note   09/02/2012 10:53 AM  Participation Quality:  Appropriate  Mood/Affect:  Appropriate and Depressed  Depression Rating:    Anxiety Rating:    Thoughts of Suicide:  No  Will you contract for safety?  No  Current AVH:  NA  Plan for Discharge/Comments:  Patient reports admitting to hospital with SI.  She advised of having problems at home.  Patient stated she is not being seen outpatient and will need a referral for services.  Patient has home transportation and access to medications.  She asked that her employer be notified of her admission to the hospital.  Transportation Means: Patient has transportation.   Supports:n  Patient has a good support system.   Loralie Malta, Joesph July
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
## 2012-09-02 NOTE — Progress Notes (Signed)
Adult Psychoeducational Group Note  Date:  09/02/2012 Time:  12:57 PM  Group Topic/Focus:  Recovery Goals:   The focus of this group is to identify appropriate goals for recovery and establish a plan to achieve them.  Participation Level:  Did Not Attend  Participation Quality:  Did not Attend  Affect:  Did not Attend  Cognitive:  Did not Attend  Insight: None  Engagement in Group:  Did not Attend  Modes of Intervention:  Did not Attend  Additional Comments:    Cathlean Cower 09/02/2012, 12:57 PM
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
## 2012-09-02 NOTE — Progress Notes (Signed)
D: Patient in the dayroom watching television on approach.  Patient states her husband came to visit her for all of her meals today.  Patient states she feels fine.  Patient state she did not like her meeting with to doctor today because patient states she was not able to tell the doctor what was wrong with her.  Patient states she wants to be heard.  Patient denies SI/ HI and denies AVH.  Patient states she needs to stay focused on the main objective and patient states the main objective is for her to stay on her medications.  A: Staff to monitor Q 15 mins for safety.  Encouragement and support offered.  Scheduled medications administered per orders.  Patient education provide to patient about Lexapro. R: Patient remains safe on the unit.  Patient attended group tonight.  Patient calm, cooperative and taking administered medications.  Patient visible on the unit and interacting with peers.

## 2012-09-02 NOTE — Progress Notes (Signed)
Recreation Therapy Notes  Date: 06.17.2014 Time: 2:45pm Location: [address] Dayroom      Group Topic/Focus: Musician (AAA/T)  Participation Level: Did not attend.   Marykay Lex Deshan Hemmelgarn, LRT/CTRS  Dillon Mcreynolds L 09/02/2012 4:30 PM

## 2012-09-03 LAB — RAPID URINE DRUG SCREEN, HOSP PERFORMED
Amphetamines: NOT DETECTED
Barbiturates: NOT DETECTED
Benzodiazepines: NOT DETECTED
Cocaine: NOT DETECTED
Opiates: NOT DETECTED
Tetrahydrocannabinol: NOT DETECTED

## 2012-09-03 LAB — URINALYSIS, ROUTINE W REFLEX MICROSCOPIC
Bilirubin Urine: NEGATIVE
Glucose, UA: NEGATIVE mg/dL
Hgb urine dipstick: NEGATIVE
Ketones, ur: NEGATIVE mg/dL
Leukocytes, UA: NEGATIVE
Nitrite: NEGATIVE
Protein, ur: NEGATIVE mg/dL
Specific Gravity, Urine: 1.019 (ref 1.005–1.030)
Urobilinogen, UA: 0.2 mg/dL (ref 0.0–1.0)
pH: 7 (ref 5.0–8.0)

## 2012-09-03 LAB — PREGNANCY, URINE: Preg Test, Ur: NEGATIVE

## 2012-09-03 MED ORDER — ESTRADIOL 2 MG PO TABS
2.0000 mg | ORAL_TABLET | Freq: Every day | ORAL | Status: DC
  Administered 2012-09-03: 2 mg via ORAL

## 2012-09-03 MED ORDER — ESTRADIOL 2 MG PO TABS: 2.0000 mg | ORAL_TABLET | Freq: Every day | ORAL | Status: DC

## 2012-09-03 MED ORDER — ESTRADIOL 2 MG PO TABS
2.0000 mg | ORAL_TABLET | Freq: Every day | ORAL | Status: DC
  Filled 2012-09-03: qty 1

## 2012-09-03 NOTE — Progress Notes (Signed)
Adult Psychoeducational Group Note  Date:  09/03/2012 Time:  10:03 PM  Group Topic/Focus:  Wrap-Up Group:   The focus of this group is to help patients review their daily goal of treatment and discuss progress on daily workbooks.  Participation Level:  Active  Participation Quality:  Appropriate  Affect:  Appropriate  Cognitive:  Alert and Oriented  Insight: Appropriate  Engagement in Group:  Developing/Improving  Modes of Intervention:  Exploration, Problem-solving and Support  Additional Comments:  Pt stated that one positive is that her husband came to visit. Pt stated that she wants to work on her communication.  [name], Randal Buba 09/03/2012, 10:03 PM

## 2012-09-03 NOTE — Progress Notes (Signed)
D: Patient pleasant and cooperative with staff and peers. Patient's affect is appropriate to circumstance and mood is anxious. She reported on the self inventory sheet that her sleep is fair, appetite/ability to pay attention are good and energy level is low. Patient rated depression and feelings of hopelessness "1". She's participating in groups and interacting with peers in the milieu. Patient compliant with medication regimen. She verbalized earlier that she wanted to go home today.  A: Support and encouragement provided to patient. Scheduled medications administered per MD orders. Maintain Q15 minute checks for safety.  R: Patient receptive. Denies SI/HI/AVH. Patient remains safe.

## 2012-09-03 NOTE — [facility] Group Notes (Signed)
[facility] LCSW Aftercare Discharge Planning Group Note   09/03/2012 12:15 PM  Participation Quality:  Appropriate  Mood/Affect:  Appropriate and Depressed  Depression Rating:    Anxiety Rating:    Thoughts of Suicide:  No  Will you contract for safety?  No  Current AVH:  NA  Plan for Discharge/Comments:  Patient reports admitting to hospital with SI.  She advised of having problems at home.  Patient stated she is not being seen outpatient and will need a referral for services.  Patient has home transportation and access to medications.  She asked that her employer be notified of her admission to the hospital.  Transportation Means: Patient has transportation.   Supports:n  Patient has a good support system.   Wayburn Shaler, Joesph July

## 2012-09-03 NOTE — Progress Notes (Signed)
[facility] MD Progress Note  09/03/2012 12:10 PM [name]  MRN:  [id number] Subjective:  Patient is presented with depression and suicidal thoughts, overdose of 3 pain pills on Friday and Sunday. She has requested to restart hormone pills from home, she feels better today. She is attending groups and self reflective on problems. She continues to be overwhelming about the situation - like conflict and argument her husband. She has good sleep and appetite in hospital. She works at night and having hard time to sleep. She has been compliant with medication and has no reported adverse effects. She has denied symptoms of anxiety today and feels her medication seems like working good for her.   Diagnosis:  Axis I: Major Depression, Recurrent severe  ADL's:  Impaired  Sleep: Fair  Appetite:  Fair  Suicidal Ideation:  She had suicidal attempt by taking pain medication but not enough to cause physical damage Homicidal Ideation:  denied AEB (as evidenced by):  Psychiatric Specialty Exam: ROS  Blood pressure 145/92, pulse 69, temperature 97.6 F (36.4 C), temperature source Oral, resp. rate 18, height 5\' 5"  (1.651 m), weight 89.359 kg (197 lb).Body mass index is 32.78 kg/(m^2).  General Appearance: Disheveled and Guarded  Patent attorney::  Fair  Speech:  Clear and Coherent  Volume:  Decreased  Mood:  Anxious and Depressed  Affect:  Constricted and Depressed  Thought Process:  Coherent and Goal Directed  Orientation:  Full (Time, Place, and Person)  Thought Content:  Rumination  Suicidal Thoughts:  Yes.  without intent/plan  Homicidal Thoughts:  No  Memory:  Immediate;   Fair Recent;   Fair  Judgement:  Impaired  Insight:  Lacking  Psychomotor Activity:  Psychomotor Retardation  Concentration:  Fair  Recall:  Fair  Akathisia:  NA  Handed:  Right  AIMS (if indicated):     Assets:  Communication Skills Desire for Improvement Physical Health Social Support Transportation  Sleep:   Number of Hours: 6.75   Current Medications: Current Facility-Administered Medications  Medication Dose Route Frequency Provider Last Rate Last Dose  . acetaminophen (TYLENOL) tablet 650 mg  650 mg Oral Q6H PRN Nehemiah Settle, MD      . alum & mag hydroxide-simeth (MAALOX/MYLANTA) 200-200-20 MG/5ML suspension 30 mL  30 mL Oral Q4H PRN Nehemiah Settle, MD      . amitriptyline (ELAVIL) tablet 25 mg  25 mg Oral QHS Nehemiah Settle, MD   25 mg at 09/02/12 2113  . clonazePAM (KLONOPIN) tablet 0.5 mg  0.5 mg Oral BID PRN Nehemiah Settle, MD      . escitalopram (LEXAPRO) tablet 10 mg  10 mg Oral Daily Nehemiah Settle, MD   10 mg at 09/03/12 4098  . ferrous sulfate tablet 325 mg  325 mg Oral Q breakfast Fransisca Kaufmann, NP   325 mg at 09/03/12 1191  . gabapentin (NEURONTIN) capsule 300 mg  300 mg Oral TID Nehemiah Settle, MD   300 mg at 09/03/12 0820  . HYDROcodone-acetaminophen (NORCO/VICODIN) 5-325 MG per tablet 1 tablet  1 tablet Oral Q6H PRN Nehemiah Settle, MD   1 tablet at 09/02/12 0622  . magnesium hydroxide (MILK OF MAGNESIA) suspension 30 mL  30 mL Oral Daily PRN Nehemiah Settle, MD        Lab Results:  Results for orders placed during the hospital encounter of 09/01/12 (from the past 48 hour(s))  CBC     Status: Abnormal   Collection Time  09/01/12  7:39 PM      Result Value Range   WBC 7.1  4.0 - 10.5 K/uL   RBC 4.17  3.87 - 5.11 MIL/uL   Hemoglobin 11.6 (*) 12.0 - 15.0 g/dL   HCT 21.3 (*) 08.6 - 57.8 %   MCV 85.1  78.0 - 100.0 fL   MCH 27.8  26.0 - 34.0 pg   MCHC 32.7  30.0 - 36.0 g/dL   RDW 46.9  62.9 - 52.8 %   Platelets 320  150 - 400 K/uL  COMPREHENSIVE METABOLIC PANEL     Status: Abnormal   Collection Time    09/01/12  7:39 PM      Result Value Range   Sodium 141  135 - 145 mEq/L   Potassium 3.6  3.5 - 5.1 mEq/L   Chloride 104  96 - 112 mEq/L   CO2 28  19 - 32 mEq/L   Glucose, Bld 90  70 -  99 mg/dL   BUN 7  6 - 23 mg/dL   Creatinine, Ser 4.13  0.50 - 1.10 mg/dL   Calcium 9.5  8.4 - 24.4 mg/dL   Total Protein 7.2  6.0 - 8.3 g/dL   Albumin 3.3 (*) 3.5 - 5.2 g/dL   AST 13  0 - 37 U/L   ALT 10  0 - 35 U/L   Alkaline Phosphatase 81  39 - 117 U/L   Total Bilirubin 0.1 (*) 0.3 - 1.2 mg/dL   GFR calc non Af Amer 75 (*) >90 mL/min   GFR calc Af Amer 87 (*) >90 mL/min   Comment:            The eGFR has been calculated     using the CKD EPI equation.     This calculation has not been     validated in all clinical     situations.     eGFR's persistently     <90 mL/min signify     possible Chronic Kidney Disease.  TSH     Status: None   Collection Time    09/01/12  7:39 PM      Result Value Range   TSH 0.492  0.350 - 4.500 uIU/mL  ETHANOL     Status: None   Collection Time    09/01/12  7:39 PM      Result Value Range   Alcohol, Ethyl (B) <11  0 - 11 mg/dL   Comment:            LOWEST DETECTABLE LIMIT FOR     SERUM ALCOHOL IS 11 mg/dL     FOR MEDICAL PURPOSES ONLY  PREGNANCY, URINE     Status: None   Collection Time    09/02/12  6:16 AM      Result Value Range   Preg Test, Ur NEGATIVE  NEGATIVE   Comment:            THE SENSITIVITY OF THIS     METHODOLOGY IS >20 mIU/mL.  URINALYSIS, ROUTINE W REFLEX MICROSCOPIC     Status: Abnormal   Collection Time    09/02/12  6:16 AM      Result Value Range   Color, Urine YELLOW  YELLOW   APPearance CLOUDY (*) CLEAR   Specific Gravity, Urine 1.019  1.005 - 1.030   pH 7.0  5.0 - 8.0   Glucose, UA NEGATIVE  NEGATIVE mg/dL   Hgb urine dipstick NEGATIVE  NEGATIVE   Bilirubin Urine NEGATIVE  NEGATIVE   Ketones, ur NEGATIVE  NEGATIVE mg/dL   Protein, ur NEGATIVE  NEGATIVE mg/dL   Urobilinogen, UA 0.2  0.0 - 1.0 mg/dL   Nitrite NEGATIVE  NEGATIVE   Leukocytes, UA NEGATIVE  NEGATIVE   Comment: MICROSCOPIC NOT DONE ON URINES WITH NEGATIVE PROTEIN, BLOOD, LEUKOCYTES, NITRITE, OR GLUCOSE <1000 mg/dL.  URINE RAPID DRUG SCREEN (HOSP  PERFORMED)     Status: None   Collection Time    09/02/12  6:16 AM      Result Value Range   Opiates NONE DETECTED  NONE DETECTED   Cocaine NONE DETECTED  NONE DETECTED   Benzodiazepines NONE DETECTED  NONE DETECTED   Amphetamines NONE DETECTED  NONE DETECTED   Tetrahydrocannabinol NONE DETECTED  NONE DETECTED   Barbiturates NONE DETECTED  NONE DETECTED   Comment:            DRUG SCREEN FOR MEDICAL PURPOSES     ONLY.  IF CONFIRMATION IS NEEDED     FOR ANY PURPOSE, NOTIFY LAB     WITHIN 5 DAYS.                LOWEST DETECTABLE LIMITS     FOR URINE DRUG SCREEN     Drug Class       Cutoff (ng/mL)     Amphetamine      1000     Barbiturate      200     Benzodiazepine   200     Tricyclics       300     Opiates          300     Cocaine          300     THC              50    Physical Findings: AIMS: Facial and Oral Movements Muscles of Facial Expression: None, normal Lips and Perioral Area: None, normal Jaw: None, normal Tongue: None, normal,Extremity Movements Upper (arms, wrists, hands, fingers): None, normal Lower (legs, knees, ankles, toes): None, normal, Trunk Movements Neck, shoulders, hips: None, normal, Overall Severity Severity of abnormal movements (highest score from questions above): None, normal Incapacitation due to abnormal movements: None, normal Patient's awareness of abnormal movements (rate only patient's report): No Awareness, Dental Status Current problems with teeth and/or dentures?: No Does patient usually wear dentures?: No  CIWA:    COWS:     Treatment Plan Summary: Daily contact with patient to assess and evaluate symptoms and progress in treatment Medication management  Plan: 1. Restart Hormone pills for menopause from home 2. Continue current medication, lexapro for depression and anxiety and may increase if needed, elavil for sleep, neurontin for mood and iron suppliments 3. Encourage unit activities 4. Disposition plans in  progress  Medical Decision Making Problem Points:  Established problem, worsening (2), Review of last therapy session (1) and Review of psycho-social stressors (1) Data Points:  Review or order clinical lab tests (1) Review or order medicine tests (1) Review of medication regiment & side effects (2) Review of new medications or change in dosage (2)  I certify that inpatient services furnished can reasonably be expected to improve the patient's condition.   Ethelbert Thain,JANARDHAHA R. 09/03/2012, 12:10 PM

## 2012-09-03 NOTE — [facility] Suicide Risk Assessment (Signed)
[facility] INPATIENT:  Family/Significant Other Suicide Prevention Education  Suicide Prevention Education:  Education Completed;[name], Husband, [phone]; as been identified by the patient as the family member/significant other with whom the patient will be residing, and identified as the person(s) who will aid the patient in the event of a mental health crisis (suicidal ideations/suicide attempt).  With written consent from the patient, the family member/significant other has been provided the following suicide prevention education, prior to the and/or following the discharge of the patient.  The suicide prevention education provided includes the following:  Suicide risk factors  Suicide prevention and interventions  National Suicide Hotline telephone number  [facility] assessment telephone number  [facility] Emergency Assistance 911  [facility] and/or Residential Mobile Crisis Unit telephone number  Request made of family/significant other to:  Remove weapons (e.g., guns, rifles, knives), all items previously/currently identified as safety concern.  Husband advised he will secure guns.  Remove drugs/medications (over-the-counter, prescriptions, illicit drugs), all items previously/currently identified as a safety concern.  The family member/significant other verbalizes understanding of the suicide prevention education information provided.  The family member/significant other agrees to remove the items of safety concern listed above.  Wynn Banker 09/03/2012, 11:26 AM

## 2012-09-03 NOTE — Progress Notes (Signed)
Adult Psychoeducational Group Note  Date:  09/03/2012 Time:  12:24 PM  Group Topic/Focus:  Personal Choices and Values:   The focus of this group is to help patients assess and explore the importance of values in their lives, how their values affect their decisions, how they express their values and what opposes their expression.  Participation Level:  Active  Participation Quality:  Appropriate and Attentive  Affect:  Flat  Cognitive:  Alert and Appropriate  Insight: Good  Engagement in Group:  Engaged  Modes of Intervention:  Activity, Discussion, Socialization and Support  Additional Comments:  Patient participated in group. The purpose of this group was for the patient to identify what four values they found most important to them and how to incorporate them into their lives. The patient identified a change or a choice for how they can support that particular value in their life. The patient shared a value with the group and an example of a choice to take responsibility for that value. There was an emphasis on choices and how choices impact and support values.   Cathlean Cower 09/03/2012, 12:24 PM

## 2012-09-03 NOTE — Progress Notes (Signed)
Recreation Therapy Notes  Date: 06.18.2014 Time: 3:00pm Location: [address] Dayroom      Group Topic/Focus: Communication, Journalist, newspaper, Team Work  Participation Level: Active  Participation Quality: Appropriate  Affect: Euhtymic  Cognitive: Appropriate   Additional Comments: Activity: Landing Pad ; Explanation: Patients in groups of 4-5 were given 10 drinking straws and a length of medical tape. Using the straws and the medical tape patients were asked create a landing pad that would catch a golf ball dropped from approximately 6 feet.    Patient actively participated in group activity. Patient assisted peers with building team landing pad. Patient team successful at building a landing pad that captured golf ball. Patient contributed to wrap up discussion about the importance of using good communication skills, problem solving skills, and team work to Engineer, petroleum.   Marykay Lex Carreen Milius, LRT/CTRS  Jearl Klinefelter 09/03/2012 5:19 PM

## 2012-09-03 NOTE — [facility] Group Notes (Signed)
[facility] LCSW Group Therapy  09/03/2012 3:09 PM  Type of Therapy:  Group Therapy  Participation Level:  Active  Participation Quality:  Attentive, Sharing and Supportive  Affect:  Appropriate  Cognitive:  Alert and Oriented  Insight:  Developing/Improving  Engagement in Therapy:  Developing/Improving  Modes of Intervention:  Discussion, Exploration, Problem-solving, Rapport Building, Socialization and Support  Summary of Progress/Problems: The topic for group today was emotional regulation.  This group focused on both positive and negative emotion identification and allowed group members to process ways to identify feelings, regulate negative emotions, and find healthy ways to manage internal/external emotions. Group members were asked to reflect on a time when their reaction to an emotion led to a negative outcome and explored how alternative responses using emotion regulation would have benefited them. Group members were also asked to discuss a time when emotion regulation was utilized when a negative emotion was experienced.  [name] was observed to be active in group as she discussed her perspective towards regulating emotions. [name] stated that it is necessary to have positive thoughts and that it's difficult when people attempt to give advice when they have not "walked in my shoes". She disclosed that empathy comes from past experiences and that others who have not suffered from depression are unable to understand the difficulty of regulating one's emotions. [name] demonstrated an improving affect and mood as she discussed her ideas to her peers in group.   [name], [name] 09/03/2012, 3:09 PM

## 2012-09-03 NOTE — Progress Notes (Signed)
D: Patient in her room on [address] visiting with her husband.  Patient states she had a good day today.  Patient states her husband came to see her three times today.  Patient denies SI/HI and denies AVH.  Patient was able to get her home supply of estrogen medication tonight.  Patient med was taken out of her locker and it is now in patient med drawer. A: Staff to monitor Q 15 mins for safety.  Encouragement and support offered.  Scheduled medications administered per orders. R: Patient remains safe on the unit.  Patient attended group tonight.  Patient calm, cooperative and taking adminsitered medications.  Patient visible on the unit and interacting with peers.

## 2012-09-04 ENCOUNTER — Encounter (HOSPITAL_COMMUNITY): Payer: Self-pay | Admitting: Psychiatry

## 2012-09-04 MED ORDER — ESTRADIOL 2 MG PO TABS
2.0000 mg | ORAL_TABLET | Freq: Every day | ORAL | Status: DC
  Administered 2012-09-04: 2 mg via ORAL

## 2012-09-04 MED ORDER — AMITRIPTYLINE HCL 25 MG PO TABS: 25.0000 mg | ORAL_TABLET | Freq: Every evening | ORAL | Status: DC | PRN

## 2012-09-04 MED ORDER — ISOMETHEPTENE-APAP-DICHLORAL 65-325-100 MG PO CAPS: 1.0000 | ORAL_CAPSULE | Freq: Every day | ORAL | Status: DC | PRN

## 2012-09-04 MED ORDER — HYDROCODONE-ACETAMINOPHEN 5-325 MG PO TABS: ORAL_TABLET | ORAL | Status: DC

## 2012-09-04 MED ORDER — CLONAZEPAM 0.5 MG PO TABS: 0.5000 mg | ORAL_TABLET | Freq: Two times a day (BID) | ORAL | Status: DC | PRN

## 2012-09-04 MED ORDER — ESTRADIOL 2 MG PO TABS: 2.0000 mg | ORAL_TABLET | Freq: Every day | ORAL | Status: DC

## 2012-09-04 MED ORDER — ESCITALOPRAM OXALATE 10 MG PO TABS: 10.0000 mg | ORAL_TABLET | Freq: Every day | ORAL | Status: DC

## 2012-09-04 MED ORDER — ALMOTRIPTAN MALATE 6.25 MG PO TABS: 6.2500 mg | ORAL_TABLET | Freq: Every day | ORAL | Status: DC | PRN

## 2012-09-04 MED ORDER — GABAPENTIN 300 MG PO CAPS: 300.0000 mg | ORAL_CAPSULE | Freq: Three times a day (TID) | ORAL | Status: DC

## 2012-09-04 MED ORDER — FERROUS SULFATE 325 (65 FE) MG PO TABS: 325.0000 mg | ORAL_TABLET | Freq: Every day | ORAL | Status: DC

## 2012-09-04 NOTE — Progress Notes (Signed)
Discharge Note:  Patient discharged with husband to their home.  Denied SI and HI.  Denied A/V hallucinations.  Denied pain.  Is excited to be discharged today.  Has note for work.  Received all her belongings, clothing, miscellaneous items, medications, prescriptions, toiletries.  Suicide prevention information given and discussed with patient, who stated she understood and had no questions.  Patient stated she appreciated all assistance received from [facility] staff.

## 2012-09-04 NOTE — [facility] Suicide Risk Assessment (Signed)
Suicide Risk Assessment  Discharge Assessment     Demographic Factors:  Adolescent or young adult  Mental Status Per Nursing Assessment::   On Admission:  Self-harm thoughts;Belief that plan would result in death  Current Mental Status by Physician: Mental Status Examination: Patient appeared as per his stated age, casually dressed, and fairly groomed, and maintaining good eye contact. Patient has good mood and his affect was constricted. He has normal rate, rhythm, and volume of speech. Her thought process is linear and goal directed. Patient has denied suicidal, homicidal ideations, intentions or plans. Patient has no evidence of auditory or visual hallucinations, delusions, and paranoia. Patient has fair insight judgment and impulse control.  Loss Factors: Financial problems/change in socioeconomic status  Historical Factors: Family history of mental illness or substance abuse and Impulsivity  Risk Reduction Factors:   Sense of responsibility to family, Religious beliefs about death, Living with another person, especially a relative, Positive social support, Positive therapeutic relationship and Positive coping skills or problem solving skills  Continued Clinical Symptoms:  Depression:   Recent sense of peace/wellbeing Severe  Cognitive Features That Contribute To Risk:  Polarized thinking    Suicide Risk:  Minimal: No identifiable suicidal ideation.  Patients presenting with no risk factors but with morbid ruminations; may be classified as minimal risk based on the severity of the depressive symptoms  Discharge Diagnoses:   AXIS I:  Major Depression, Recurrent severe AXIS II:  Deferred AXIS III:   Past Medical History  Diagnosis Date  . Borderline diabetic   . Weight gain   . Sleep deprivation     loss of sleep  . Fatigue   . Shortness of breath     with pain   . Nausea   . Blurred vision   . Wears glasses   . Arthritis   . Migraine headache   . History of  measles, mumps, or rubella   . H/O varicella   . Hx of ovarian cyst   . Yeast infection   . H/O bacterial infection   . Back pain   . Depression    AXIS IV:  other psychosocial or environmental problems, problems related to social environment and problems with access to health care services AXIS V:  61-70 mild symptoms  Plan Of Care/Follow-up recommendations:  Activity:  as tolerated Diet:  Regular  Is patient on multiple antipsychotic therapies at discharge:  No   Has Patient had three or more failed trials of antipsychotic monotherapy by history:  No  Recommended Plan for Multiple Antipsychotic Therapies: Not applicable  Helem Reesor,JANARDHAHA R. 09/04/2012, 10:26 AM
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
## 2012-09-04 NOTE — Progress Notes (Signed)
[facility] Adult Case Management Discharge Plan :  Will you be returning to the same living situation after discharge: Yes,  Patient is returning to her home. At discharge, do you have transportation home?:Yes,  Patient has transportation home. Do you have the ability to pay for your medications:  Yes, Patient is able to obtain medications.   Release of information consent forms completed and in the chart;  Patient's signature needed at discharge.  Patient to Follow up at: Follow-up Information   Follow up with Shaaron Adler [facility] On 09/09/2012. (Tuesday, June 24 at 1:30 PM with Shaaron Adler)    Contact information:   [address] [place], [place]   [address]  [phone]      Patient denies SI/HI:  Patient no longer endorsing SI/HI or other thoughts of self harm.   Safety Planning and Suicide Prevention discussed:  .Reviewed with all patients during discharge planning group  [name], [name] July 09/04/2012, 12:45 PM

## 2012-09-04 NOTE — Progress Notes (Signed)
Adult Psychoeducational Group Note  Date:  09/04/2012 Time:  1:24 PM  Group Topic/Focus:  Overcoming Stress:   The focus of this group is to define stress and help patients assess their triggers.  Participation Level:  Minimal  Participation Quality:  Appropriate  Affect:  Appropriate  Cognitive:  Appropriate  Insight: Good  Engagement in Group:  Engaged  Modes of Intervention:  Discussion, Education and Support  Additional Comments:  Pt enjoys singing in the choir and uses this activity as a way to relieve stress.  Reynolds Bowl 09/04/2012, 1:24 PM

## 2012-09-04 NOTE — Progress Notes (Addendum)
D:  Patient's self inventory sheet, patient sleeps well, has good appetite, normal energy level, good attention span.  Denied depression and hopelessness.  Denied withdrawals.  Denied SI.  Denied physical problems.  Zero pain goal, zero pain.  Plans to take meds, and communicate more after discharge.  No discharge plans.  No problems taking meds after discharge. A:  Medications administered per MD orders.  Emotional support and encouragement given patient. R:  Denied SI and HI.  Denied A/V hallucinations.  Will continue to monitor for safety with 15 minute checks.  Safety maintained.

## 2012-09-04 NOTE — [facility] Group Notes (Signed)
[facility] LCSW Aftercare Discharge Planning Group Note   09/04/2012 12:05 PM  Participation Quality:  Appropriate  Mood/Affect:  Appropriate and Depressed  Depression Rating:  0  Anxiety Rating:  0  Thoughts of Suicide:  No  Will you contract for safety?  No  Current AVH:  NA  Plan for Discharge/Comments:  Patient reports doing wonderful today and looking forward to discharging home.    Transportation Means: Patient has transportation.   Supports:n  Patient has a good support system.   [name], Joesph July

## 2012-09-04 NOTE — Progress Notes (Signed)
Patient ID: [name], female   DOB: 03-Mar-1961, 52 y.o.   MRN: [id number]  D: Patient lying in bed with eyes closed. Respirations even and non-labored.  A: Staff will monitor on q 15 minute checks, follow treatment plan, and give meds as ordered. R: Appears asleep. No distress

## 2012-09-09 NOTE — Progress Notes (Signed)
Patient Discharge Instructions:  After Visit Summary (AVS):   Faxed to:  09/09/12 Discharge Summary Note:   Faxed to:  09/09/12 Psychiatric Admission Assessment Note:   Faxed to:  09/09/12 Suicide Risk Assessment - Discharge Assessment:   Faxed to:  09/09/12 Faxed/Sent to the Next Level Care provider:  09/09/12 Faxed to [facility] @ [phone]  Jerelene Redden, 09/09/2012, 3:55 PM

## 2012-10-20 ENCOUNTER — Emergency Department (HOSPITAL_COMMUNITY): Payer: 59

## 2012-10-20 ENCOUNTER — Emergency Department (HOSPITAL_COMMUNITY)
Admission: EM | Admit: 2012-10-20 | Discharge: 2012-10-20 | Disposition: A | Discharge status: Home / Self Care | Payer: 59 | Attending: Emergency Medicine | Admitting: Emergency Medicine

## 2012-10-20 ENCOUNTER — Encounter (HOSPITAL_COMMUNITY): Payer: Self-pay | Admitting: *Deleted

## 2012-10-20 DIAGNOSIS — Z8619 Personal history of other infectious and parasitic diseases: Secondary | ICD-10-CM | POA: Insufficient documentation

## 2012-10-20 DIAGNOSIS — R110 Nausea: Secondary | ICD-10-CM | POA: Insufficient documentation

## 2012-10-20 DIAGNOSIS — K7689 Other specified diseases of liver: Secondary | ICD-10-CM | POA: Insufficient documentation

## 2012-10-20 DIAGNOSIS — Z79899 Other long term (current) drug therapy: Secondary | ICD-10-CM | POA: Insufficient documentation

## 2012-10-20 DIAGNOSIS — R109 Unspecified abdominal pain: Secondary | ICD-10-CM

## 2012-10-20 DIAGNOSIS — Z8742 Personal history of other diseases of the female genital tract: Secondary | ICD-10-CM | POA: Insufficient documentation

## 2012-10-20 DIAGNOSIS — F17200 Nicotine dependence, unspecified, uncomplicated: Secondary | ICD-10-CM | POA: Insufficient documentation

## 2012-10-20 DIAGNOSIS — R51 Headache: Secondary | ICD-10-CM | POA: Insufficient documentation

## 2012-10-20 DIAGNOSIS — Z882 Allergy status to sulfonamides status: Secondary | ICD-10-CM | POA: Insufficient documentation

## 2012-10-20 DIAGNOSIS — R1032 Left lower quadrant pain: Secondary | ICD-10-CM | POA: Insufficient documentation

## 2012-10-20 DIAGNOSIS — Z8669 Personal history of other diseases of the nervous system and sense organs: Secondary | ICD-10-CM | POA: Insufficient documentation

## 2012-10-20 DIAGNOSIS — M129 Arthropathy, unspecified: Secondary | ICD-10-CM | POA: Insufficient documentation

## 2012-10-20 DIAGNOSIS — F329 Major depressive disorder, single episode, unspecified: Secondary | ICD-10-CM | POA: Insufficient documentation

## 2012-10-20 DIAGNOSIS — Z87898 Personal history of other specified conditions: Secondary | ICD-10-CM | POA: Insufficient documentation

## 2012-10-20 DIAGNOSIS — Z789 Other specified health status: Secondary | ICD-10-CM | POA: Insufficient documentation

## 2012-10-20 DIAGNOSIS — F3289 Other specified depressive episodes: Secondary | ICD-10-CM | POA: Insufficient documentation

## 2012-10-20 LAB — COMPREHENSIVE METABOLIC PANEL
ALT: 10 U/L (ref 0–35)
AST: 13 U/L (ref 0–37)
Albumin: 3.1 g/dL — ABNORMAL LOW (ref 3.5–5.2)
Alkaline Phosphatase: 69 U/L (ref 39–117)
BUN: 11 mg/dL (ref 6–23)
CO2: 26 mEq/L (ref 19–32)
Calcium: 9.6 mg/dL (ref 8.4–10.5)
Chloride: 102 mEq/L (ref 96–112)
Creatinine, Ser: 0.83 mg/dL (ref 0.50–1.10)
GFR calc Af Amer: >90 mL/min (ref >90)
GFR calc non Af Amer: 81 mL/min — ABNORMAL LOW (ref >90)
Glucose, Bld: 98 mg/dL (ref 70–99)
Potassium: 3.6 mEq/L (ref 3.5–5.1)
Sodium: 138 mEq/L (ref 135–145)
Total Bilirubin: 0.1 mg/dL — ABNORMAL LOW (ref 0.3–1.2)
Total Protein: 7 g/dL (ref 6.0–8.3)

## 2012-10-20 LAB — CBC WITH DIFFERENTIAL/PLATELET
Basophils Absolute: 0 10*3/uL (ref 0.0–0.1)
Basophils Relative: 0 % (ref 0–1)
Eosinophils Absolute: 0.2 10*3/uL (ref 0.0–0.7)
Eosinophils Relative: 3 % (ref 0–5)
HCT: 33.9 % — ABNORMAL LOW (ref 36.0–46.0)
Hemoglobin: 11.4 g/dL — ABNORMAL LOW (ref 12.0–15.0)
Lymphocytes Relative: 48 % — ABNORMAL HIGH (ref 12–46)
Lymphs Abs: 2.8 10*3/uL (ref 0.7–4.0)
MCH: 28.9 pg (ref 26.0–34.0)
MCHC: 33.6 g/dL (ref 30.0–36.0)
MCV: 86 fL (ref 78.0–100.0)
Monocytes Absolute: 0.3 10*3/uL (ref 0.1–1.0)
Monocytes Relative: 6 % (ref 3–12)
Neutro Abs: 2.5 10*3/uL (ref 1.7–7.7)
Neutrophils Relative %: 43 % (ref 43–77)
Platelets: 288 10*3/uL (ref 150–400)
RBC: 3.94 MIL/uL (ref 3.87–5.11)
RDW: 14 % (ref 11.5–15.5)
WBC: 5.9 10*3/uL (ref 4.0–10.5)

## 2012-10-20 LAB — URINALYSIS, ROUTINE W REFLEX MICROSCOPIC
Bilirubin Urine: NEGATIVE
Glucose, UA: NEGATIVE mg/dL
Hgb urine dipstick: NEGATIVE
Ketones, ur: NEGATIVE mg/dL
Leukocytes, UA: NEGATIVE
Nitrite: NEGATIVE
Protein, ur: NEGATIVE mg/dL
Specific Gravity, Urine: 1.006 (ref 1.005–1.030)
Urobilinogen, UA: 0.2 mg/dL (ref 0.0–1.0)
pH: 6.5 (ref 5.0–8.0)

## 2012-10-20 MED ORDER — ONDANSETRON 8 MG PO TBDP: ORAL_TABLET | ORAL | Status: DC

## 2012-10-20 MED ORDER — GI COCKTAIL ~~LOC~~
30.0000 mL | Freq: Once | ORAL | Status: AC
  Administered 2012-10-20: 30 mL via ORAL
  Filled 2012-10-20: qty 30

## 2012-10-20 MED ORDER — MORPHINE SULFATE 4 MG/ML IJ SOLN
4.0000 mg | Freq: Once | INTRAMUSCULAR | Status: AC
  Administered 2012-10-20: 4 mg via INTRAVENOUS
  Filled 2012-10-20: qty 1

## 2012-10-20 MED ORDER — METOCLOPRAMIDE HCL 5 MG/ML IJ SOLN
10.0000 mg | Freq: Once | INTRAMUSCULAR | Status: AC
  Administered 2012-10-20: 10 mg via INTRAVENOUS
  Filled 2012-10-20: qty 2

## 2012-10-20 MED ORDER — IOHEXOL 300 MG/ML  SOLN
100.0000 mL | Freq: Once | INTRAMUSCULAR | Status: AC | PRN
  Administered 2012-10-20: 100 mL via INTRAVENOUS

## 2012-10-20 MED ORDER — IOHEXOL 300 MG/ML  SOLN
50.0000 mL | Freq: Once | INTRAMUSCULAR | Status: AC | PRN
  Administered 2012-10-20: 50 mL via ORAL

## 2012-10-20 MED ORDER — SODIUM CHLORIDE 0.9 % IV BOLUS (SEPSIS)
500.0000 mL | Freq: Once | INTRAVENOUS | Status: AC
  Administered 2012-10-20: 500 mL via INTRAVENOUS

## 2012-10-20 MED ORDER — ONDANSETRON HCL 4 MG/2ML IJ SOLN
4.0000 mg | Freq: Once | INTRAMUSCULAR | Status: AC
  Administered 2012-10-20: 4 mg via INTRAVENOUS
  Filled 2012-10-20: qty 2

## 2012-10-20 MED ORDER — HYDROCODONE-ACETAMINOPHEN 5-325 MG PO TABS: 1.0000 | ORAL_TABLET | ORAL | Status: DC | PRN

## 2012-10-20 MED ORDER — PANTOPRAZOLE SODIUM 40 MG IV SOLR
40.0000 mg | Freq: Once | INTRAVENOUS | Status: AC
  Administered 2012-10-20: 40 mg via INTRAVENOUS
  Filled 2012-10-20: qty 40

## 2012-10-20 NOTE — ED Notes (Signed)
Pt states upper left quadrant sharp abdominal pain, with nausea. LBM 2200. Throbbing HA at frontal and temporal regions.

## 2012-10-20 NOTE — ED Notes (Addendum)
Pt reports ate oysters and shrimp Friday night and again leftovers on Saturday night. Developed sx Sunday morning. C/o abd pain and nausea. Pinpoints pain to mid upper abd. Denies fever bleeding or diarrhea. "thinks it might be related to "food poisoning". Also mentions HA.

## 2012-10-20 NOTE — ED Notes (Signed)
Pt alert, NAD, calm, interactive, resps e/u, speaking in clear complete sentences, lab finished at [facility], EDPA at [facility], family at [facility].

## 2012-10-20 NOTE — ED Provider Notes (Signed)
Medical screening examination/treatment/procedure(s) were performed by non-physician practitioner and as supervising physician I was immediately available for consultation/collaboration.  Lakshmi Sundeen R. Chung Chagoya, MD 10/20/12 0724 

## 2012-10-20 NOTE — ED Notes (Signed)
EDP in to see pt, updated on lab results, husband at [facility], pt alert, NAD, calm.

## 2012-10-20 NOTE — ED Notes (Signed)
Pt to CT

## 2012-10-20 NOTE — ED Notes (Signed)
Pt drinking PO contrast, 2 cups.

## 2012-10-20 NOTE — ED Provider Notes (Signed)
CSN: [id number]     Arrival date & time 10/20/12  0034 History     First MD Initiated Contact with Patient 10/20/12 0046     Chief Complaint  Patient presents with  . Abdominal Pain   HPI  History provided by the patient and significant other. Patient is a 52 year old female with history of hypercholesterolemia who presents with complaints of worsening left abdominal pain and nausea. Symptoms first began yesterday evening and were intermittent with brief episodes of sharp cramping type abdominal pains. Patient felt symptoms initially may be from gas. Symptoms however became more frequent and persistent. Now she has been having continuous sharp pains. There has been some associated nausea but no episodes of vomiting. Patient has had regular bowel movements without change. No blood or mucus in stools. She denies any associated fever, chills or sweats but does report having a hot flash while here in the emergency room. She denies any chest pain or shortness of breath. Symptoms have been associated with a headache early this morning. The headache is generalized throbbing headache to the frontotemporal regions. She denies any other aggravating or alleviating factors. No other associated symptoms.   Past Medical History  Diagnosis Date  . Borderline diabetic   . Weight gain   . Sleep deprivation     loss of sleep  . Fatigue   . Shortness of breath     with pain   . Nausea   . Blurred vision   . Wears glasses   . Arthritis   . Migraine headache   . History of measles, mumps, or rubella   . H/O varicella   . Hx of ovarian cyst   . Yeast infection   . H/O bacterial infection   . Back pain   . Depression    Past Surgical History  Procedure Laterality Date  . Abdominal hysterectomy    . Oophorectomy  2009  . Cholecystectomy  11/13/10  . Dilation and curettage of uterus    . Tubal ligation    . Dg gall bladder     Family History  Problem Relation Age of Onset  . Hypertension Mother    . Hypotension Mother   . Anemia Mother     low iron  . Heart disease Sister   . Sickle cell trait Other    History  Substance Use Topics  . Smoking status: Current Some Day Smoker -- 0.50 packs/day    Types: Cigarettes  . Smokeless tobacco: Never Used  . Alcohol Use: No     Comment: socially,  none recently   OB History   Grav Para Term Preterm Abortions TAB SAB Ect Mult Living   3 2 2  1     2      Review of Systems  Constitutional: Negative for fever, chills and diaphoresis.  Respiratory: Negative for shortness of breath.   Cardiovascular: Negative for chest pain.  Gastrointestinal: Positive for nausea and abdominal pain. Negative for vomiting, diarrhea, constipation and blood in stool.  Genitourinary: Negative for dysuria, frequency, hematuria and flank pain.  Neurological: Positive for headaches.  All other systems reviewed and are negative.    Allergies  Sulfa antibiotics  Home Medications   Current Outpatient Rx  Name  Route  Sig  Dispense  Refill  . almotriptan (AXERT) 6.25 MG tablet   Oral   Take 1 tablet (6.25 mg total) by mouth daily as needed for migraine. may repeat in 2 hours if needed   10 tablet  0   . amitriptyline (ELAVIL) 25 MG tablet   Oral   Take 1 tablet (25 mg total) by mouth at bedtime as needed for sleep.         . clonazePAM (KLONOPIN) 0.5 MG tablet   Oral   Take 1 tablet (0.5 mg total) by mouth 2 (two) times daily as needed (anxiety).   30 tablet   0   . escitalopram (LEXAPRO) 10 MG tablet   Oral   Take 1 tablet (10 mg total) by mouth daily. For Depression.   30 tablet   0   . estradiol (ESTRACE) 2 MG tablet   Oral   Take 1 tablet (2 mg total) by mouth daily.   30 tablet   6   . ferrous sulfate 325 (65 FE) MG tablet   Oral   Take 1 tablet (325 mg total) by mouth daily with breakfast.   30 tablet   0   . gabapentin (NEURONTIN) 300 MG capsule   Oral   Take 1 capsule (300 mg total) by mouth 3 (three) times daily.    90 capsule   0   . HYDROcodone-acetaminophen (NORCO/VICODIN) 5-325 MG per tablet      1 to 2 tabs every 4 to 6 hours as needed for pain.   20 tablet   0   . isometheptene-acetaminophen-dichloralphenazone (MIDRIN) 65-325-100 MG capsule   Oral   Take 1 capsule by mouth daily as needed for migraine.   30 capsule   0   . PRESCRIPTION MEDICATION   Topical   Apply 1 application topically daily as needed (Applies to right foot for pain.). Specialty Compounded Cream for Foot.          BP 99/79  Pulse 69  Temp(Src) 97.8 F (36.6 C) (Oral)  Resp 20  SpO2 99% Physical Exam  Nursing note and vitals reviewed. Constitutional: She is oriented to person, place, and time. She appears well-developed and well-nourished. No distress.  HENT:  Head: Normocephalic.  Eyes: Conjunctivae are normal.  Cardiovascular: Normal rate and regular rhythm.   No murmur heard. Pulmonary/Chest: Effort normal and breath sounds normal. No respiratory distress. She has no wheezes. She has no rales.  Abdominal: Soft. There is tenderness in the left lower quadrant. There is no rebound, no guarding, no CVA tenderness, no tenderness at McBurney's point and negative Murphy's sign.  Musculoskeletal: Normal range of motion.  Neurological: She is alert and oriented to person, place, and time.  Skin: Skin is warm and dry. No rash noted.  Psychiatric: She has a normal mood and affect. Her behavior is normal.    ED Course   Procedures    Results for orders placed during the hospital encounter of 10/20/12  CBC WITH DIFFERENTIAL      Result Value Range   WBC 5.9  4.0 - 10.5 K/uL   RBC 3.94  3.87 - 5.11 MIL/uL   Hemoglobin 11.4 (*) 12.0 - 15.0 g/dL   HCT 16.1 (*) 09.6 - 04.5 %   MCV 86.0  78.0 - 100.0 fL   MCH 28.9  26.0 - 34.0 pg   MCHC 33.6  30.0 - 36.0 g/dL   RDW 40.9  81.1 - 91.4 %   Platelets 288  150 - 400 K/uL   Neutrophils Relative % 43  43 - 77 %   Neutro Abs 2.5  1.7 - 7.7 K/uL   Lymphocytes  Relative 48 (*) 12 - 46 %   Lymphs Abs 2.8  0.7 - 4.0 K/uL   Monocytes Relative 6  3 - 12 %   Monocytes Absolute 0.3  0.1 - 1.0 K/uL   Eosinophils Relative 3  0 - 5 %   Eosinophils Absolute 0.2  0.0 - 0.7 K/uL   Basophils Relative 0  0 - 1 %   Basophils Absolute 0.0  0.0 - 0.1 K/uL  COMPREHENSIVE METABOLIC PANEL      Result Value Range   Sodium 138  135 - 145 mEq/L   Potassium 3.6  3.5 - 5.1 mEq/L   Chloride 102  96 - 112 mEq/L   CO2 26  19 - 32 mEq/L   Glucose, Bld 98  70 - 99 mg/dL   BUN 11  6 - 23 mg/dL   Creatinine, Ser 1.61  0.50 - 1.10 mg/dL   Calcium 9.6  8.4 - 09.6 mg/dL   Total Protein 7.0  6.0 - 8.3 g/dL   Albumin 3.1 (*) 3.5 - 5.2 g/dL   AST 13  0 - 37 U/L   ALT 10  0 - 35 U/L   Alkaline Phosphatase 69  39 - 117 U/L   Total Bilirubin 0.1 (*) 0.3 - 1.2 mg/dL   GFR calc non Af Amer 81 (*) >90 mL/min   GFR calc Af Amer >90  >90 mL/min  URINALYSIS, ROUTINE W REFLEX MICROSCOPIC      Result Value Range   Color, Urine YELLOW  YELLOW   APPearance CLEAR  CLEAR   Specific Gravity, Urine 1.006  1.005 - 1.030   pH 6.5  5.0 - 8.0   Glucose, UA NEGATIVE  NEGATIVE mg/dL   Hgb urine dipstick NEGATIVE  NEGATIVE   Bilirubin Urine NEGATIVE  NEGATIVE   Ketones, ur NEGATIVE  NEGATIVE mg/dL   Protein, ur NEGATIVE  NEGATIVE mg/dL   Urobilinogen, UA 0.2  0.0 - 1.0 mg/dL   Nitrite NEGATIVE  NEGATIVE   Leukocytes, UA NEGATIVE  NEGATIVE       Ct Abdomen Pelvis W Contrast  10/20/2012   *RADIOLOGY REPORT*  Clinical Data: Abdominal pain and nausea.  CT ABDOMEN AND PELVIS WITH CONTRAST  Technique:  Multidetector CT imaging of the abdomen and pelvis was performed following the standard protocol during bolus administration of intravenous contrast.  Contrast: OMNIPAQUE IOHEXOL 300 MG/ML  SOLN  Comparison: CT abdomen and pelvis 06/05/2006.  Findings: There is some dependent atelectasis in the lung bases. No pleural or pericardial effusion is identified.  A few small hepatic cysts are seen  as identified on the prior study.  The patient is status post cholecystectomy.  The adrenal glands, spleen, pancreas, biliary tree and right kidney appear normal.  Small cyst lower pole left kidney is again noted as on the prior study.  The patient is status post hysterectomy.  The stomach and small and large bowel appear normal.  There is no lymphadenopathy.  Tiny amount of free pelvic fluid is noted.  No focal bony abnormality is identified.  IMPRESSION:  1.  Tiny amount of free pelvic fluid is likely physiologic but could be due to enteritis.  No focal abnormality is seen. 2.  Status post cholecystectomy and hysterectomy.   Original Report Authenticated By: Holley Dexter, M.D.     1. Abdominal pain     MDM  1:00AM patient seen and evaluated. Patient appears in mild discomfort no acute distress.   Patient having some improvement in the pain center medications. Initial lab testing unremarkable. She continues to have significant left  lower quadrant pains. We'll obtain CT rule out diverticulitis.  CT also without any concerning acute findings. Patient has a small hepatic lesions. The discusses with the patient in spouse who did seem familiar with this from previous scans. They were instructed to continue to followup with their primary care provider.  At this time patient fell able to discharge home. She was given strict return precautions regarding her abdominal pain symptoms. She expressed her understanding and is ready to return home.   Angus Seller, PA-C 10/20/12 [phone]

## 2012-12-27 ENCOUNTER — Encounter (HOSPITAL_COMMUNITY): Payer: Self-pay | Admitting: Emergency Medicine

## 2012-12-27 ENCOUNTER — Emergency Department (INDEPENDENT_AMBULATORY_CARE_PROVIDER_SITE_OTHER)
Admission: EM | Admit: 2012-12-27 | Discharge: 2012-12-27 | Disposition: A | Discharge status: Home / Self Care | Payer: PRIVATE HEALTH INSURANCE | Source: Home / Self Care | Attending: Family Medicine | Admitting: Family Medicine

## 2012-12-27 DIAGNOSIS — M545 Low back pain, unspecified: Secondary | ICD-10-CM

## 2012-12-27 MED ORDER — TRAMADOL HCL 50 MG PO TABS: 50.0000 mg | ORAL_TABLET | Freq: Four times a day (QID) | ORAL | Status: DC | PRN

## 2012-12-27 MED ORDER — CYCLOBENZAPRINE HCL 10 MG PO TABS: 10.0000 mg | ORAL_TABLET | Freq: Three times a day (TID) | ORAL | Status: DC | PRN

## 2012-12-27 NOTE — ED Provider Notes (Signed)
CSN: [id number]     Arrival date & time 12/27/12  1349 History   First MD Initiated Contact with Patient 12/27/12 1511     Chief Complaint  Patient presents with  . Back Pain   (Consider location/radiation/quality/duration/timing/severity/associated sxs/prior Treatment) HPI Comments: Patient sees Dr. Ethelene Hal at [facility] orthopedics for chronic back pain. She has an upcoming appointment on Tuesday.  Patient is a 52 y.o. female presenting with back pain. The history is provided by the patient.  Back Pain Location:  Lumbar spine and sacro-iliac joint Quality:  Aching Radiates to:  R posterior upper leg and R foot Pain severity:  Severe Pain is:  Same all the time Onset quality: started worsening after work on Thursday night. Duration:  2 days (acute on chronic) Timing:  Constant Progression:  Unchanged Chronicity:  Recurrent Context comment:  Patiet sitting for long periods of time at work recently Relieved by:  Muscle relaxants Worsened by:  Movement Ineffective treatments:  Narcotics Associated symptoms: no abdominal pain, no bladder incontinence, no bowel incontinence, no dysuria, no fever, no numbness, no paresthesias, no pelvic pain and no weakness   Risk factors: obesity   Risk factors: no hx of osteoporosis and no recent surgery     Past Medical History  Diagnosis Date  . Borderline diabetic   . Weight gain   . Sleep deprivation     loss of sleep  . Fatigue   . Shortness of breath     with pain   . Nausea   . Blurred vision   . Wears glasses   . Arthritis   . Migraine headache   . History of measles, mumps, or rubella   . H/O varicella   . Hx of ovarian cyst   . Yeast infection   . H/O bacterial infection   . Back pain   . Depression    Past Surgical History  Procedure Laterality Date  . Abdominal hysterectomy    . Oophorectomy  2009  . Cholecystectomy  11/13/10  . Dilation and curettage of uterus    . Tubal ligation    . Dg gall bladder     Family  History  Problem Relation Age of Onset  . Hypertension Mother   . Hypotension Mother   . Anemia Mother     low iron  . Heart disease Sister   . Sickle cell trait Other    History  Substance Use Topics  . Smoking status: Current Some Day Smoker -- 0.50 packs/day    Types: Cigarettes  . Smokeless tobacco: Never Used  . Alcohol Use: No     Comment: socially,  none recently   OB History   Grav Para Term Preterm Abortions TAB SAB Ect Mult Living   3 2 2  1     2      Review of Systems  Constitutional: Negative for fever.  Gastrointestinal: Negative for abdominal pain and bowel incontinence.  Genitourinary: Negative for bladder incontinence, dysuria, difficulty urinating and pelvic pain.  Musculoskeletal: Positive for back pain.  Neurological: Negative for weakness, numbness and paresthesias.    Allergies  Sulfa antibiotics  Home Medications   Current Outpatient Rx  Name  Route  Sig  Dispense  Refill  . almotriptan (AXERT) 6.25 MG tablet   Oral   Take 1 tablet (6.25 mg total) by mouth daily as needed for migraine. may repeat in 2 hours if needed   10 tablet   0   . amitriptyline (ELAVIL) 25 MG  tablet   Oral   Take 12.5-25 mg by mouth at bedtime as needed for sleep.         . clonazePAM (KLONOPIN) 0.5 MG tablet   Oral   Take 1 tablet (0.5 mg total) by mouth 2 (two) times daily as needed (anxiety).   30 tablet   0   . cyclobenzaprine (FLEXERIL) 10 MG tablet   Oral   Take 1 tablet (10 mg total) by mouth 3 (three) times daily as needed for muscle spasms.   30 tablet   0   . estradiol (ESTRACE) 2 MG tablet   Oral   Take 1 tablet (2 mg total) by mouth daily.   30 tablet   6   . ferrous sulfate 325 (65 FE) MG tablet   Oral   Take 325 mg by mouth 2 (two) times a week. Days vary         . gabapentin (NEURONTIN) 300 MG capsule   Oral   Take 300 mg by mouth daily as needed (for pain).         Marland Kitchen HYDROcodone-acetaminophen (NORCO) 5-325 MG per tablet    Oral   Take 1-2 tablets by mouth every 4 (four) hours as needed for pain.   20 tablet   0   . lamoTRIgine (LAMICTAL) 25 MG tablet   Oral   Take 25 mg by mouth.         . ondansetron (ZOFRAN ODT) 8 MG disintegrating tablet      8mg  ODT q4 hours prn nausea   20 tablet   0   . traMADol (ULTRAM) 50 MG tablet   Oral   Take 1 tablet (50 mg total) by mouth every 6 (six) hours as needed for pain.   30 tablet   0    BP 130/82  Pulse 70  Temp(Src) 98.6 F (37 C) (Oral)  Resp 18  SpO2 98% Physical Exam  Constitutional: She is oriented to person, place, and time. She appears well-developed and well-nourished. No distress.  Musculoskeletal:       Lumbar back: She exhibits tenderness.  Neurological: She is oriented to person, place, and time. She has normal strength. No sensory deficit.  Reflex Scores:      Patellar reflexes are 2+ on the right side and 2+ on the left side.      Achilles reflexes are 2+ on the right side and 2+ on the left side. Negative straight leg raise test bilaterally  Skin: She is not diaphoretic.    ED Course  Procedures (including critical care time) Labs Review Labs Reviewed - No data to display Imaging Review No results found.  EKG Interpretation     Ventricular Rate:    PR Interval:    QRS Duration:   QT Interval:    QTC Calculation:   R Axis:     Text Interpretation:              MDM   1. Lumbago    Acute on chronic low back pain without evidence of new injury or neurologic deficit. Therefore no radiographic evaluation necessary at this time. Patient will followup with her physical medicine rehabilitation doctor, Dr. Ethelene Hal on Tuesday. She was given prescriptions for Flexeril and tramadol to use when necessary.  Garnetta Buddy, MD 12/27/12 1540

## 2012-12-27 NOTE — ED Notes (Signed)
Pt  States  She  Has       History  Of         Injury  To  Her  Back  Over  1  Year  Ago              She  Reports  Recurrent  Pain    Worse     Over       Last  Few  Days         denys  Recent  Injury  denys  Any  Urinary  Symptoms

## 2012-12-28 NOTE — ED Provider Notes (Signed)
Medical screening examination/treatment/procedure(s) were performed by resident physician or non-physician practitioner and as supervising physician I was immediately available for consultation/collaboration.   Florentina Marquart DOUGLAS MD.   Elani Delph D Smrithi Pigford, MD 12/28/12 1240 

## 2013-02-10 ENCOUNTER — Emergency Department (HOSPITAL_COMMUNITY)
Admission: EM | Admit: 2013-02-10 | Discharge: 2013-02-10 | Disposition: A | Discharge status: Home / Self Care | Payer: 59 | Source: Home / Self Care | Attending: Emergency Medicine | Admitting: Emergency Medicine

## 2013-02-10 ENCOUNTER — Encounter (HOSPITAL_COMMUNITY): Payer: Self-pay | Admitting: Emergency Medicine

## 2013-02-10 DIAGNOSIS — H16001 Unspecified corneal ulcer, right eye: Secondary | ICD-10-CM

## 2013-02-10 DIAGNOSIS — H16009 Unspecified corneal ulcer, unspecified eye: Secondary | ICD-10-CM

## 2013-02-10 MED ORDER — FLUCONAZOLE 150 MG PO TABS: 150.0000 mg | ORAL_TABLET | Freq: Once | ORAL | Status: DC

## 2013-02-10 MED ORDER — MOXIFLOXACIN HCL 0.5 % OP SOLN: 1.0000 [drp] | Freq: Three times a day (TID) | OPHTHALMIC | Status: DC

## 2013-02-10 NOTE — ED Provider Notes (Signed)
Chief Complaint:   Chief Complaint  Patient presents with  . Eye Problem    History of Present Illness:   [name] is a 52 year old female who has had a three-day history of redness, itching, irritation, foreign body sensation, tearing, pain, and photophobia involving the right eye. She's had a slight headache and her vision is mildly blurry. She tried some tobramycin, but this burned. She denies any injury to the eye. There is no purulent drainage. The left eye is normal. She denies any fever or URI symptoms.  Review of Systems:  Other than noted above, the patient denies any of the following symptoms: Systemic:  No fever, chills, sweats, fatigue, or weight loss. Eye:  No redness, eye pain, photophobia, discharge, blurred vision, or diplopia. ENT:  No nasal congestion, rhinorrhea, or sore throat. Lymphatic:  No adenopathy. Skin:  No rash or pruritis.  PMFSH:  Past medical history, family history, social history, meds, and allergies were reviewed.  She's allergic to sulfa. She takes Lamictal for mood control and condyle.  Physical Exam:   Vital signs:  BP 130/84  Pulse 88  Temp(Src) 98.2 F (36.8 C) (Oral)  Resp 20  SpO2 97% General:  Alert and in no distress. Eye:  Her lids are normal. There is mild conjunctival injection. No drainage. On the cornea there is a small opaque area visible at the 8:00 position near the periphery of the cornea. The central portion the cornea was clear. The anterior chamber was normal. PERRLA, full EOMs, fundi are benign. Fluorescein staining of the cornea reveals a 1 mm corneal ulcer at the 8:00 position near the periphery of the cornea. ENT:  TMs and canals clear.  Nasal mucosa normal.  No intra-oral lesions, mucous membranes moist, pharynx clear. Neck:  No adenopathy tenderness or mass. Skin:  Clear, warm and dry.  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  Plan:   1.  Meds:  The following meds were prescribed:   Discharge  Medication List as of 02/10/2013  3:51 PM    START taking these medications   Details  fluconazole (DIFLUCAN) 150 MG tablet Take 1 tablet (150 mg total) by mouth once., Starting 02/10/2013, Normal    moxifloxacin (VIGAMOX) 0.5 % ophthalmic solution Place 1 drop into the right eye 3 (three) times daily., Starting 02/10/2013, Until Discontinued, Normal        2.  Patient Education/Counseling:  The patient was given appropriate handouts, self care instructions, and instructed in symptomatic relief.  Instructed not to rub the eye and to use warm or cool compresses.  3.  Follow up:  The patient was told to follow up with her eye doctor as soon as possible, preferably tomorrow.      Reuben Likes, MD 02/10/13 2159

## 2013-02-10 NOTE — ED Notes (Signed)
Right eye pain yesterday, and patient had expired tobramycin eye drops and started using them this morning.  Patient reports eye painful, irritated, itching.  Wants new script.

## 2013-07-20 ENCOUNTER — Encounter (HOSPITAL_COMMUNITY): Payer: Self-pay | Admitting: Emergency Medicine

## 2013-07-20 ENCOUNTER — Emergency Department (HOSPITAL_COMMUNITY)
Admission: EM | Admit: 2013-07-20 | Discharge: 2013-07-20 | Discharge status: Against Medical Advice | Payer: 59 | Source: Home / Self Care | Attending: Family Medicine | Admitting: Family Medicine

## 2013-07-20 DIAGNOSIS — R350 Frequency of micturition: Secondary | ICD-10-CM

## 2013-07-20 LAB — POCT URINALYSIS DIP (DEVICE)
Bilirubin Urine: NEGATIVE
Glucose, UA: NEGATIVE mg/dL
Hgb urine dipstick: NEGATIVE
Ketones, ur: NEGATIVE mg/dL
Leukocytes, UA: NEGATIVE
Nitrite: NEGATIVE
Protein, ur: NEGATIVE mg/dL
Specific Gravity, Urine: 1.015 (ref 1.005–1.030)
Urobilinogen, UA: 0.2 mg/dL (ref 0.0–1.0)
pH: 7 (ref 5.0–8.0)

## 2013-07-20 NOTE — ED Notes (Signed)
Pt left stating "this is ridiculous".  Mw,cma

## 2013-07-20 NOTE — ED Notes (Signed)
Reports urinary frequency "feels like I'm not emptying my bladder"  Extreme thirst.  And fatigue.   Hx of borderline DM

## 2013-07-20 NOTE — ED Provider Notes (Signed)
CSN: [id number]     Arrival date & time 07/20/13  1626 History   First MD Initiated Contact with Patient 07/20/13 1733     Chief Complaint  Patient presents with  . Urinary Frequency   (Consider location/radiation/quality/duration/timing/severity/associated sxs/prior Treatment) HPI Comments: Patient presents with urinary frequency since this am. No dysuria or gross hematuria. No CVA pain. No abdominal pain. No fever or chills. Mild fatigue. She thinks she might have a UTI.   Patient is a 53 y.o. female presenting with frequency. The history is provided by the patient.  Urinary Frequency    Past Medical History  Diagnosis Date  . Borderline diabetic   . Weight gain   . Sleep deprivation     loss of sleep  . Fatigue   . Shortness of breath     with pain   . Nausea   . Blurred vision   . Wears glasses   . Arthritis   . Migraine headache   . History of measles, mumps, or rubella   . H/O varicella   . Hx of ovarian cyst   . Yeast infection   . H/O bacterial infection   . Back pain   . Depression    Past Surgical History  Procedure Laterality Date  . Abdominal hysterectomy    . Oophorectomy  2009  . Cholecystectomy  11/13/10  . Dilation and curettage of uterus    . Tubal ligation    . Dg gall bladder     Family History  Problem Relation Age of Onset  . Hypertension Mother   . Hypotension Mother   . Anemia Mother     low iron  . Heart disease Sister   . Sickle cell trait Other    History  Substance Use Topics  . Smoking status: Current Some Day Smoker -- 0.50 packs/day    Types: Cigarettes  . Smokeless tobacco: Never Used  . Alcohol Use: No     Comment: socially,  none recently   OB History   Grav Para Term Preterm Abortions TAB SAB Ect Mult Living   3 2 2  1     2      Review of Systems  Genitourinary: Positive for frequency.  All other systems reviewed and are negative.   Allergies  Sulfa antibiotics  Home Medications   Prior to Admission  medications   Medication Sig Start Date End Date Taking? Authorizing Provider  estradiol (ESTRACE) 2 MG tablet Take 1 tablet (2 mg total) by mouth daily. 09/04/12  Yes Elmarie Shiley, NP  HYDROcodone-acetaminophen (NORCO) 5-325 MG per tablet Take 1-2 tablets by mouth every 4 (four) hours as needed for pain. 10/20/12  Yes Peter S Dammen, PA-C  almotriptan (AXERT) 6.25 MG tablet Take 1 tablet (6.25 mg total) by mouth daily as needed for migraine. may repeat in 2 hours if needed 09/04/12   Elmarie Shiley, NP  amitriptyline (ELAVIL) 25 MG tablet Take 12.5-25 mg by mouth at bedtime as needed for sleep. 09/04/12   Elmarie Shiley, NP  clonazePAM (KLONOPIN) 0.5 MG tablet Take 1 tablet (0.5 mg total) by mouth 2 (two) times daily as needed (anxiety). 09/04/12   Elmarie Shiley, NP  cyclobenzaprine (FLEXERIL) 10 MG tablet Take 1 tablet (10 mg total) by mouth 3 (three) times daily as needed for muscle spasms. 12/27/12   Angelica Ran, MD  ferrous sulfate 325 (65 FE) MG tablet Take 325 mg by mouth 2 (two) times a week. Days vary 09/04/12  Elmarie Shiley, NP  fluconazole (DIFLUCAN) 150 MG tablet Take 1 tablet (150 mg total) by mouth once. 02/10/13   Harden Mo, MD  gabapentin (NEURONTIN) 300 MG capsule Take 300 mg by mouth daily as needed (for pain). 09/04/12   Elmarie Shiley, NP  lamoTRIgine (LAMICTAL) 25 MG tablet Take 25 mg by mouth.    Historical Provider, MD  moxifloxacin (VIGAMOX) 0.5 % ophthalmic solution Place 1 drop into the right eye 3 (three) times daily. 02/10/13   Harden Mo, MD  ondansetron (ZOFRAN ODT) 8 MG disintegrating tablet 8mg  ODT q4 hours prn nausea 10/20/12   Ruthell Rummage Dammen, PA-C  traMADol (ULTRAM) 50 MG tablet Take 1 tablet (50 mg total) by mouth every 6 (six) hours as needed for pain. 12/27/12   Angelica Ran, MD   BP 145/87  Pulse 64  Temp(Src) 98.2 F (36.8 C) (Oral)  Resp 16  SpO2 100% Physical Exam  Constitutional: She is oriented to person, place, and time. She appears well-developed  and well-nourished. No distress.  HENT:  Head: Normocephalic and atraumatic.  Pulmonary/Chest: Effort normal.  Abdominal: Soft. Bowel sounds are normal. She exhibits no distension and no mass. There is tenderness. There is no rebound and no guarding.  Mild suprapubic tenderness with deep palpation  Neurological: She is alert and oriented to person, place, and time. No cranial nerve deficit.  Skin: Skin is warm and dry. She is not diaphoretic.  Psychiatric: Her behavior is normal.    ED Course  Procedures (including critical care time) Labs Review Labs Reviewed  POCT URINALYSIS DIP (DEVICE)    Imaging Review No results found.   MDM   1. Urinary frequency    Urine clean, was checking a CBG, patient left [place], PA-C 07/20/13 1830

## 2013-07-21 NOTE — ED Provider Notes (Signed)
Medical screening examination/treatment/procedure(s) were performed by resident physician or non-physician practitioner and as supervising physician I was immediately available for consultation/collaboration.   Pauline Good MD.   Billy Fischer, MD 07/21/13 [phone]

## 2013-10-07 ENCOUNTER — Telehealth: Payer: Self-pay | Admitting: Obstetrics and Gynecology

## 2013-10-07 NOTE — Telephone Encounter (Signed)
Confirming pts appt °

## 2013-10-14 ENCOUNTER — Telehealth: Payer: Self-pay

## 2013-10-14 ENCOUNTER — Encounter: Payer: Self-pay | Admitting: Obstetrics and Gynecology

## 2013-10-14 ENCOUNTER — Ambulatory Visit (INDEPENDENT_AMBULATORY_CARE_PROVIDER_SITE_OTHER): Payer: 59 | Admitting: Obstetrics and Gynecology

## 2013-10-14 VITALS — BP 108/72 | HR 60 | Ht 65.25 in | Wt 204.0 lb

## 2013-10-14 DIAGNOSIS — Z0000 Encounter for general adult medical examination without abnormal findings: Secondary | ICD-10-CM

## 2013-10-14 DIAGNOSIS — R7309 Other abnormal glucose: Secondary | ICD-10-CM

## 2013-10-14 DIAGNOSIS — Z01419 Encounter for gynecological examination (general) (routine) without abnormal findings: Secondary | ICD-10-CM

## 2013-10-14 LAB — CBC
HCT: 34.2 % — ABNORMAL LOW (ref 36.0–46.0)
Hemoglobin: 11.8 g/dL — ABNORMAL LOW (ref 12.0–15.0)
MCH: 28.2 pg (ref 26.0–34.0)
MCHC: 34.5 g/dL (ref 30.0–36.0)
MCV: 81.6 fL (ref 78.0–100.0)
Platelets: 336 10*3/uL (ref 150–400)
RBC: 4.19 MIL/uL (ref 3.87–5.11)
RDW: 14.1 % (ref 11.5–15.5)
WBC: 4.7 10*3/uL (ref 4.0–10.5)

## 2013-10-14 LAB — COMPREHENSIVE METABOLIC PANEL
ALT: 9 U/L (ref 0–35)
AST: 13 U/L (ref 0–37)
Albumin: 3.7 g/dL (ref 3.5–5.2)
Alkaline Phosphatase: 66 U/L (ref 39–117)
BUN: 9 mg/dL (ref 6–23)
CO2: 26 mEq/L (ref 19–32)
Calcium: 9.3 mg/dL (ref 8.4–10.5)
Chloride: 105 mEq/L (ref 96–112)
Creat: 0.89 mg/dL (ref 0.50–1.10)
Glucose, Bld: 93 mg/dL (ref 70–99)
Potassium: 3.9 mEq/L (ref 3.5–5.3)
Sodium: 138 mEq/L (ref 135–145)
Total Bilirubin: 0.3 mg/dL (ref 0.2–1.2)
Total Protein: 6.8 g/dL (ref 6.0–8.3)

## 2013-10-14 LAB — POCT URINALYSIS DIPSTICK
Bilirubin, UA: NEGATIVE
Blood, UA: NEGATIVE
Glucose, UA: NEGATIVE
Ketones, UA: NEGATIVE
Leukocytes, UA: NEGATIVE
Nitrite, UA: NEGATIVE
Protein, UA: NEGATIVE
Urobilinogen, UA: NEGATIVE
pH, UA: 6

## 2013-10-14 LAB — LIPID PANEL
Cholesterol: 234 mg/dL — ABNORMAL HIGH (ref 0–200)
HDL: 51 mg/dL (ref >39)
LDL Cholesterol: 156 mg/dL — ABNORMAL HIGH (ref 0–99)
Total CHOL/HDL Ratio: 4.6 Ratio
Triglycerides: 136 mg/dL (ref <150)
VLDL: 27 mg/dL (ref 0–40)

## 2013-10-14 LAB — TSH: TSH: 0.73 u[IU]/mL (ref 0.350–4.500)

## 2013-10-14 LAB — HEMOGLOBIN A1C
Hgb A1c MFr Bld: 6.4 % — ABNORMAL HIGH (ref <5.7)
Mean Plasma Glucose: 137 mg/dL — ABNORMAL HIGH (ref <117)

## 2013-10-14 LAB — HEMOGLOBIN, FINGERSTICK: Hemoglobin, fingerstick: 12.1 g/dL (ref 12.0–16.0)

## 2013-10-14 MED ORDER — ESTRADIOL 1 MG PO TABS: 1.0000 mg | ORAL_TABLET | Freq: Every day | ORAL | Status: DC

## 2013-10-14 MED ORDER — METRONIDAZOLE 0.75 % VA GEL: 1.0000 | Freq: Every day | VAGINAL | Status: DC

## 2013-10-14 NOTE — Patient Instructions (Signed)

## 2013-10-14 NOTE — Telephone Encounter (Signed)
Spoke with [facility] at time of incoming call. Calling to verify directions for estradiol. Advised per Dr.Silva's note patient is to take 1.5mg  of estradiol daily. See office visit note from [place] below.  Refill on Estrace 1.5 mg daily. See Epic orders. Gave Rx for 1 mg tabs. Take 1/1/2 per day. #60. RF none.  Routing to provider for final review. Patient agreeable to disposition. Will close encounter

## 2013-10-14 NOTE — Addendum Note (Signed)
Addended by: Tacy Learn, Elzie Knisley E on: 10/14/2013 12:28 PM   Modules accepted: Orders

## 2013-10-14 NOTE — Progress Notes (Signed)
GYNECOLOGY VISIT  PCP: Dr. Alroy Dust  Referring provider:   HPI: 53 y.o.   Married  [place] American  female   [phone] with No LMP recorded. Patient has had a hysterectomy.   here for    Had cholecystectomy in 2012 for gallstones and pain.  Constant hunger.  Cannot work out since had a back injury.  Frustrated with weight gain.  Has not had any recent care for her elevated glucose.   Hgb:  12.1 Urine:  Normal, ph 6.0  GYNECOLOGIC HISTORY: No LMP recorded. Patient has had a hysterectomy. Sexually active:  Yes Partner preference: Female Contraception:   None Menopausal hormone therapy: Estrace DES exposure:   No Blood transfusions:  No  Sexually transmitted diseases:   No GYN procedures and prior surgeries: Dilation and Curettage of Uterus, Tubal Ligation, laparoscopic BSO 2008 - benign, vaginal hysterectomy.  Last mammogram:    unsure             Last pap and high risk HPV testing:   10/09/11 - LGSIL.  Colposcopy done.  Believes results were normal.  History of abnormal pap smear:  yes   OB History   Grav Para Term Preterm Abortions TAB SAB Ect Mult Living   3 2 2  1     2        LIFESTYLE: Exercise:  No             Tobacco: No Alcohol:No Drug use: No   OTHER HEALTH MAINTENANCE: Tetanus/TDap:2014 Gardisil:no Influenza:10/14   Zostavax:no  Bone density: never Colonoscopy:06/2013, WNL repeat in 10 years  Cholesterol check:   Family History  Problem Relation Age of Onset  . Hypertension Mother   . Hypotension Mother   . Anemia Mother     low iron  . Heart disease Sister   . Sickle cell trait Other     Patient Active Problem List   Diagnosis Date Noted  . Wears glasses   . Arthritis   . Migraine headache   . History of measles, mumps, or rubella   . H/O varicella   . Hx of ovarian cyst   . Yeast infection   . H/O bacterial infection   . Back pain   . Borderline diabetic   . Weight gain   . Sleep deprivation   . Fatigue   . Shortness of breath    . Nausea   . Blurred vision   . Menopausal symptoms 10/05/2011  . DIABETES MELLITUS, TYPE I 10/04/2008  . MIGRAINE HEADACHE 10/04/2008  . CHEST PAIN 10/04/2008   Past Medical History  Diagnosis Date  . Borderline diabetic   . Weight gain   . Sleep deprivation     loss of sleep  . Fatigue   . Shortness of breath     with pain   . Nausea   . Blurred vision   . Wears glasses   . Arthritis   . Migraine headache   . History of measles, mumps, or rubella   . H/O varicella   . Hx of ovarian cyst   . Yeast infection   . H/O bacterial infection   . Back pain   . Depression     Past Surgical History  Procedure Laterality Date  . Abdominal hysterectomy    . Oophorectomy  2009  . Cholecystectomy  11/13/10  . Dilation and curettage of uterus    . Tubal ligation    . Dg gall bladder      ALLERGIES: Sulfa  antibiotics  Current Outpatient Prescriptions  Medication Sig Dispense Refill  . cyclobenzaprine (FLEXERIL) 10 MG tablet Take 1 tablet (10 mg total) by mouth 3 (three) times daily as needed for muscle spasms.  30 tablet  0  . estradiol (ESTRACE) 2 MG tablet Take 1 tablet (2 mg total) by mouth daily.  30 tablet  6  . HYDROcodone-acetaminophen (NORCO) 5-325 MG per tablet Take 1-2 tablets by mouth every 4 (four) hours as needed for pain.  20 tablet  0  . meloxicam (MOBIC) 7.5 MG tablet Take 7.5 mg by mouth daily.       No current facility-administered medications for this visit.     ROS:  Pertinent items are noted in HPI.  SOCIAL HISTORY:  Married.  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  Had back injury at work 2013. Fell over an IV cord.   PHYSICAL EXAMINATION:    BP 108/72  Pulse 60  Ht 5' 5.25" (1.657 m)  Wt 204 lb (92.534 kg)  BMI 33.70 kg/m2   Wt Readings from Last 3 Encounters:  10/14/13 204 lb (92.534 kg)  09/01/12 197 lb (89.359 kg)  07/21/12 196 lb (88.905 kg)     Ht Readings from Last 3 Encounters:  10/14/13 5' 5.25" (1.657 m)  09/01/12 5'  5" (1.651 m)  07/21/12 5\' 5"  (1.651 m)    General appearance: alert, cooperative and appears stated age Head: Normocephalic, without obvious abnormality, atraumatic Neck: no adenopathy, supple, symmetrical, trachea midline and thyroid not enlarged, symmetric, no tenderness/mass/nodules Lungs: clear to auscultation bilaterally Breasts: Inspection negative, No nipple retraction or dimpling, No nipple discharge or bleeding, No axillary or supraclavicular adenopathy, Normal to palpation without dominant masses Heart: regular rate and rhythm Abdomen: soft, non-tender; no masses,  no organomegaly Extremities: extremities normal, atraumatic, no cyanosis or edema Skin: Skin color, texture, turgor normal. No rashes or lesions Lymph nodes: Cervical, supraclavicular, and axillary nodes normal. No abnormal inguinal nodes palpated Neurologic: Grossly normal  Pelvic: External genitalia:  no lesions              Urethra:  normal appearing urethra with no masses, tenderness or lesions              Bartholins and Skenes: normal                 Vagina: normal appearing vagina with normal color and discharge, no lesions              Cervix:  absent              Pap and high risk HPV testing done: Yes.  .            Bimanual Exam:  Uterus:   absent                                      Adnexa: normal adnexa in size, nontender and no masses                                      Rectovaginal: Confirms                                      Anus:  normal sphincter tone, no lesions  ASSESSMENT  Normal gynecologic exam. Status post TVH. Status post laparoscopic BSO.  LGSIL pap in 2013.  Elevated blood glucose. Obesity.  Bacterial vaginosis by history.  Mentioned odor after exam complete.   PLAN  Mammogram recommended yearly.  Will schedule for patient.  Pap smear and high risk HPV testing performed.  Refill on Estrace 1.5 mg daily.  See Epic orders.  Gave Rx for 1 mg tabs.  Take 1/1/2 per day. #60.  RF  none.  Discussed risks of breast cancer, DVT, PE, MI, stroke.  Will refill the rest of Rx after her mammogram is back.  Metrogel 5 days.  Will have patient go to [facility] for informational seminar on weight loss surgery.  Counseled on self breast exam, Calcium and vitamin D intake, exercise. Patient will need to establish care with internal medicine.  Will wait for blood work to come back to determine the best referral.  Return annually or prn   An After Visit Summary was printed and given to the patient.

## 2013-10-15 ENCOUNTER — Other Ambulatory Visit: Payer: Self-pay | Admitting: Obstetrics and Gynecology

## 2013-10-15 DIAGNOSIS — E7800 Pure hypercholesterolemia, unspecified: Secondary | ICD-10-CM

## 2013-10-15 DIAGNOSIS — R7309 Other abnormal glucose: Secondary | ICD-10-CM

## 2013-10-16 ENCOUNTER — Telehealth: Payer: Self-pay | Admitting: Emergency Medicine

## 2013-10-16 LAB — IPS PAP TEST WITH HPV

## 2013-10-16 NOTE — Telephone Encounter (Signed)
Spoke with patient. Message from Dr. Quincy Simmonds given. She is agreeable to referral to Dr. Buddy Duty. Advised of lab results and verbalized understanding.

## 2013-10-16 NOTE — Telephone Encounter (Signed)
Message copied by Michele Mcalpine on Fri Oct 16, 2013 11:06 AM ------      Message from: [place], [place]: Thu Oct 15, 2013  8:04 PM       Please contact patient regarding her elevated glucose and cholesterol.      I am placing a referral for Dr. Buddy Duty, endocrinology at [facility]      First available is OK if there will be a delay in an appointment. ------

## 2013-10-19 ENCOUNTER — Ambulatory Visit: Payer: PRIVATE HEALTH INSURANCE | Attending: Physical Medicine and Rehabilitation

## 2013-10-19 DIAGNOSIS — IMO0001 Reserved for inherently not codable concepts without codable children: Secondary | ICD-10-CM | POA: Diagnosis present

## 2013-10-19 DIAGNOSIS — M545 Low back pain, unspecified: Secondary | ICD-10-CM | POA: Insufficient documentation

## 2013-10-19 DIAGNOSIS — M5137 Other intervertebral disc degeneration, lumbosacral region: Secondary | ICD-10-CM | POA: Diagnosis not present

## 2013-10-19 DIAGNOSIS — M51379 Other intervertebral disc degeneration, lumbosacral region without mention of lumbar back pain or lower extremity pain: Secondary | ICD-10-CM | POA: Insufficient documentation

## 2013-10-20 ENCOUNTER — Encounter: Payer: Self-pay | Admitting: Obstetrics and Gynecology

## 2013-10-20 NOTE — Telephone Encounter (Signed)
Mychart message sent to patient:  To: Salome Spotted   From: Karen Chafe, RN   Created: 10/20/2013 2:54 PM    [name],   We have scheduled your appointment with the Endocrinologist, Dr. Letta Median at [facility] for Thursday 10/22/13 with appointment time at 4:00 but they would like you to arrive at 3:45 to complete paperwork. If this date or time will not be convenient for you, you may call their office to reschedule at [phone]. Please call our office if need anything further. I have also left you a message on your mobile phone. You can disregard that message if you see this mychart message first. Thank you!   Karen Chafe, BSN RN  Triage Nurse [facility] [address] [place], [place] [address] [facility]: [phone]; Fax: [phone]    Message left to return call to [facility] at [phone].

## 2013-10-20 NOTE — Telephone Encounter (Signed)
Patient returned call and she is given appointment information and is agreeable.  Routing to provider for final review. Patient agreeable to disposition. Will close encounter

## 2013-10-20 NOTE — Telephone Encounter (Signed)
Calling patient.  Patient requested on mychart for evaluation prior to 8/13.  Appointment with first available provider, Dr. Letta Median at 1545 on Thursday 10/22/13 at [facility] at [place]. [phone]

## 2013-10-20 NOTE — Telephone Encounter (Signed)
Pt calling to check on referral to the endocrinologist.

## 2013-10-21 ENCOUNTER — Ambulatory Visit
Admission: RE | Admit: 2013-10-21 | Discharge: 2013-10-21 | Disposition: A | Discharge status: Home / Self Care | Payer: 59 | Source: Ambulatory Visit | Attending: Obstetrics and Gynecology | Admitting: Obstetrics and Gynecology

## 2013-10-21 DIAGNOSIS — Z01419 Encounter for gynecological examination (general) (routine) without abnormal findings: Secondary | ICD-10-CM

## 2013-10-22 ENCOUNTER — Encounter: Payer: Self-pay | Admitting: Internal Medicine

## 2013-10-22 ENCOUNTER — Ambulatory Visit (INDEPENDENT_AMBULATORY_CARE_PROVIDER_SITE_OTHER): Payer: 59 | Admitting: Internal Medicine

## 2013-10-22 VITALS — BP 114/70 | HR 79 | Temp 98.1°F | Resp 12 | Ht 65.5 in | Wt 204.0 lb

## 2013-10-22 DIAGNOSIS — R7309 Other abnormal glucose: Secondary | ICD-10-CM

## 2013-10-22 DIAGNOSIS — E785 Hyperlipidemia, unspecified: Secondary | ICD-10-CM | POA: Insufficient documentation

## 2013-10-22 DIAGNOSIS — R7303 Prediabetes: Secondary | ICD-10-CM

## 2013-10-22 MED ORDER — PRAVASTATIN SODIUM 40 MG PO TABS: 40.0000 mg | ORAL_TABLET | Freq: Every day | ORAL | Status: DC

## 2013-10-22 MED ORDER — METFORMIN HCL 500 MG PO TABS: 500.0000 mg | ORAL_TABLET | Freq: Two times a day (BID) | ORAL | Status: DC

## 2013-10-22 NOTE — Patient Instructions (Signed)
Please start metformin 500 mg with dinner for 3 days, then add another 500 mg tablet with breakfast. Continue with 500 mg 2x a day with meals.  Check sugars once a day or every other day.  Start Pravastatin 40 mg daily at bedtime.

## 2013-10-22 NOTE — Progress Notes (Signed)
Patient ID: [name], female   DOB: 1961-01-17, 53 y.o.   MRN: [id number]  HPI: [name] is a 53 y.o.-year-old female, referred by her PCP, Dr. Judeth Horn, for management of prediabetes, diet-ctrl-ed, controlled, without complications and hyperlipidemia.  Patient has been diagnosed with diabetes in early 2000s; she has not been on insulin before.  Last hemoglobin A1c was: Lab Results  Component Value Date   HGBA1C 6.4* 10/14/2013   HGBA1C 6.1* 10/05/2011   Pt controls her DM just with diet.  Pt did not check sugars - last 1 mo ago: 107-125. No lows. Lowest sugar was 97; ? she has hypoglycemia awareness Highest sugar was 135.  She has a OneTouch Ultra.  Pt's meals are: - Breakfast: chicken/pork chop/ham bisquit, fruit: banana, watermelon, cantaloupe - Lunch: sandwich, burger - Dinner: fried chicken, rice, veggies; tea; dessert - Snacks: 3  - no CKD, last BUN/creatinine:  Lab Results  Component Value Date   BUN 9 10/14/2013   CREATININE 0.89 10/14/2013   - last eye exam was in 03-06/2013. No DR. She has blurry vision. - no numbness and tingling in feet.  Pt has FH of preDM in sister.  HL: Reviewed last 2 sets of lipids:  Lab Results  Component Value Date   CHOL 234* 10/14/2013   CHOL 240* 10/05/2011   Lab Results  Component Value Date   HDL 51 10/14/2013   HDL 49 10/05/2011   Lab Results  Component Value Date   LDLCALC 156* 10/14/2013   LDLCALC 163* 10/05/2011   Lab Results  Component Value Date   TRIG 136 10/14/2013   TRIG 142 10/05/2011   Lab Results  Component Value Date   CHOLHDL 4.6 10/14/2013   CHOLHDL 4.9 10/05/2011   Her last LFTs are normal: Lab Results  Component Value Date   ALT 9 10/14/2013   AST 13 10/14/2013   ALKPHOS 66 10/14/2013   BILITOT 0.3 10/14/2013   She has not tried statins in the past. Has FH of HL in sister. Eats mostly fried foods.  ROS: Constitutional: + all: weight gain, increased appetite, fatigue, subjective  hyperthermia, + excessive urination Eyes: + blurry vision (ulcer R eye), no xerophthalmia ENT: no sore throat, no nodules palpated in throat, no dysphagia/odynophagia, no hoarseness Cardiovascular: no CP/SOB/palpitations/+ leg swelling Respiratory: no cough/SOB Gastrointestinal: no N/V/D/C Musculoskeletal: no muscle/joint aches, + heartburn Skin: no rashes, + itching Neurological: no tremors/numbness/tingling/dizziness, + HA Psychiatric: + depression/no anxiety + low libido  Past Medical History  Diagnosis Date  . Borderline diabetic   . Weight gain   . Sleep deprivation     loss of sleep  . Fatigue   . Shortness of breath     with pain   . Nausea   . Blurred vision   . Wears glasses   . Arthritis   . Migraine headache   . History of measles, mumps, or rubella   . H/O varicella   . Hx of ovarian cyst   . Yeast infection   . H/O bacterial infection   . Back pain   . Depression    Past Surgical History  Procedure Laterality Date  . Abdominal hysterectomy    . Oophorectomy  2009  . Cholecystectomy  11/13/10  . Dilation and curettage of uterus    . Tubal ligation    . Dg gall bladder     History   Social History  . Marital Status: Married    Spouse Name: N/A  Number of Children: 2   Occupational History  . phlebotomist   Social History Main Topics  . Smoking status: Former - quit in 2011    Types: Cigarettes  . Smokeless tobacco: Never Used  . Alcohol Use: No     Comment: socially,  none recently  . Drug Use: No  . Sexual Activity: Yes    Birth Control/ Protection: Surgical     Comment: Hysterectomy   Social History Narrative   Patient lives at home with her husband [name]). She works for [employer] and she has her associates degree. Two children.   Current Outpatient Prescriptions on File Prior to Visit  Medication Sig Dispense Refill  . HYDROcodone-acetaminophen (NORCO) 5-325 MG per tablet Take 1-2 tablets by mouth every 4 (four) hours as needed  for pain.  20 tablet  0  . metroNIDAZOLE (METROGEL) 0.75 % vaginal gel Place 1 Applicatorful vaginally at bedtime.  70 g  0  . cyclobenzaprine (FLEXERIL) 10 MG tablet Take 1 tablet (10 mg total) by mouth 3 (three) times daily as needed for muscle spasms.  30 tablet  0  . estradiol (ESTRACE) 1 MG tablet Take 1 tablet (1 mg total) by mouth daily. Take 1 1/2 mg per day.  60 tablet  0  . meloxicam (MOBIC) 7.5 MG tablet Take 7.5 mg by mouth daily.       No current facility-administered medications on file prior to visit.   Allergies  Allergen Reactions  . Sulfa Antibiotics Anaphylaxis, Hives and Swelling   Family History  Problem Relation Age of Onset  . Hypertension Mother   . Hypotension Mother   . Anemia Mother     low iron  . Heart disease Sister   . Sickle cell trait Other    PE: BP 114/70  Pulse 79  Temp(Src) 98.1 F (36.7 C) (Oral)  Resp 12  Ht 5' 5.5" (1.664 m)  Wt 204 lb (92.534 kg)  BMI 33.42 kg/m2  SpO2 97% Wt Readings from Last 3 Encounters:  10/22/13 204 lb (92.534 kg)  10/14/13 204 lb (92.534 kg)  09/01/12 197 lb (89.359 kg)   Constitutional: overweight, in NAD Eyes: PERRLA, EOMI, no exophthalmos ENT: moist mucous membranes, no thyromegaly, no cervical lymphadenopathy Cardiovascular: RRR, No MRG Respiratory: CTA B Gastrointestinal: abdomen soft, NT, ND, BS+ Musculoskeletal: no deformities, strength intact in all 4 Skin: moist, warm, no rashes Neurological: no tremor with outstretched hands, DTR normal in all 4  ASSESSMENT: 1. Prediabetes  2. HL  PLAN:  1. Patient prediabetes, diet-controlled - We discussed about options for treatment, and I suggested to:  Patient Instructions  Please start metformin 500 mg with dinner for 3 days, then add another 500 mg tablet with breakfast. Continue with 500 mg 2x a day with meals. Check sugars once a day or every other day. - Strongly advised her to start checking sugars at different times of the day - check once a  day or every other day, rotating checks - given sugar log and advised how to fill it and to bring it at next appt  - given foot care handout and explained the principles  - given instructions for hypoglycemia management "15-15 rule"  - advised for yearly eye exams - referred her to nutrition. Discussed about improving diet.  - Return to clinic in 3 mo with sugar log  2. HL - Pt with high LDL, also prediabetic, obese - advised to start Pravachol 40 mg at night - referred to nutrition -  she eats "everything fried" - will check CMP and Lipids when she returns Patient Instructions  Start Pravastatin 40 mg daily at bedtime.

## 2013-11-02 ENCOUNTER — Encounter: Payer: 59 | Attending: Internal Medicine | Admitting: Dietician

## 2013-11-02 ENCOUNTER — Encounter: Payer: Self-pay | Admitting: Dietician

## 2013-11-02 VITALS — Ht 65.0 in | Wt 201.2 lb

## 2013-11-02 DIAGNOSIS — Z713 Dietary counseling and surveillance: Secondary | ICD-10-CM | POA: Diagnosis present

## 2013-11-02 DIAGNOSIS — R7309 Other abnormal glucose: Secondary | ICD-10-CM | POA: Diagnosis present

## 2013-11-02 DIAGNOSIS — E669 Obesity, unspecified: Secondary | ICD-10-CM | POA: Diagnosis not present

## 2013-11-02 DIAGNOSIS — Z6833 Body mass index (BMI) 33.0-33.9, adult: Secondary | ICD-10-CM | POA: Diagnosis not present

## 2013-11-02 NOTE — Progress Notes (Signed)
Medical Nutrition Therapy:  Appt start time: 0945 end time:  1045.   Assessment:  Primary concerns today: [name] is here today since she has borderline diabetes Hgb A1c of 6.4% and is a patient of Dr. Cruzita Lederer. Has had prediabetes for the past few years and A1c is higher than before. Has been checking her blood sugar about 4 x week before and after meals. Not sure what she is averaging fasting and highest reading has been 151 mg/dl.   States she has not been eating well and has been craving sugar. Eats "everything" she wants to and eats a lot of sweets including sweet drinks. Also having a lot of fried foods. Has eaten healthier in the past and did not have a problem with it. Feels "weak minded" about her eating choices.  States that she has episodes of depression and has "taken herself off of medications" since she didn't like the way she felt. Has a counselor but has not seen her in awhile.   Has a bad back with limits her activity level. Lost about 3 lbs recently though overall had been gaining weight (about 10 lbs in the past year and about 50 lbs in the past few years). Would like to lose 50 lbs.  Lives with her husband and she does the food shopping and prepares the meals. Has started making changing to her diet by no longer skipping meals eats out about 4 meals per week since July. Works in [place] at [employer]  Preferred Learning Style:   No preference indicated   Learning Readiness:  Ready  MEDICATIONS: metformin   DIETARY INTAKE:  Usual eating pattern includes 3 meals and 1-2 snacks per day.  Avoided foods include: tofu, cauliflower, eggs, peanut butter (crackers)  24-hr recall:  B ( AM): oatmeal and fruit with water or 2% milk Snk ( AM): smoothie 3 x week (Mango and pineapple smoothie from [employer]) L ( PM): grilled chicken salad  Snk ( PM): none D ( PM): fried chicken or pork chop, zucchini and squash, stewed cabbage, brown rice  Snk ( PM): sweets -  frozen strawberry popsicles (was having ice cream or Maxie B) Beverages: water, milk, lemonade, diet soda, and sweet tea  Usual physical activity: none  Estimated energy needs: 1600 calories 180 g carbohydrates 120 g protein 44 g fat  Progress Towards Goal(s):  In progress.   Nutritional Diagnosis:  [place]-3.3 Overweight/obesity As related to excess consumption of carbohydrates and high fat foods.  As evidenced by BMI of 33.5 and Hgb A1c of 6.4%.    Intervention:  Nutrition counseling provided. Goals:  Eat 3 meals and 2 snacks, every 3-5 hrs  Limit carbohydrate intake to 30-45 grams carbohydrate/meal  Limit carbohydrate intake to 15 grams carbohydrate/snack  Add lean protein foods to meals/snacks  Monitor glucose levels as instructed by your doctor  Aim for 30 mins of physical activity daily (work your up way up)  Bring food record and glucose log to your next nutrition visit  [place] Protein bars for snacks  Choose mostly whole grains instead of refined grains  Choose lean proteins instead of fried foods  Teaching Method Utilized:  Visual Auditory Hands on  Handouts given during visit include:  Yellow Card  MyPlate Handout  15 g CHO Snacks  Blood Glucose Monitoring  Barriers to learning/adherence to lifestyle change: has some back pain which makes walking difficult  Demonstrated degree of understanding via:  Teach Back   Monitoring/Evaluation:  Dietary intake,  exercise, and body weight in 4 week(s).

## 2013-11-02 NOTE — Patient Instructions (Signed)
Goals:  Follow Diabetes Meal Plan as instructed  Eat 3 meals and 2 snacks, every 3-5 hrs  Limit carbohydrate intake to 30-45 grams carbohydrate/meal  Limit carbohydrate intake to 15 grams carbohydrate/snack  Add lean protein foods to meals/snacks  Monitor glucose levels as instructed by your doctor  Aim for 30 mins of physical activity daily (work your up way up)  Bring food record and glucose log to your next nutrition visit  [place] Protein bars for snacks  Choose mostly whole grains instead of refined grains  Choose lean proteins instead of fried foods

## 2013-11-05 ENCOUNTER — Encounter: Payer: Self-pay | Admitting: Internal Medicine

## 2013-11-08 ENCOUNTER — Encounter: Payer: Self-pay | Admitting: Obstetrics and Gynecology

## 2013-11-08 DIAGNOSIS — N890 Mild vaginal dysplasia: Secondary | ICD-10-CM | POA: Insufficient documentation

## 2013-11-18 ENCOUNTER — Ambulatory Visit (INDEPENDENT_AMBULATORY_CARE_PROVIDER_SITE_OTHER): Payer: 59 | Admitting: Family Medicine

## 2013-11-18 VITALS — BP 110/78 | HR 60 | Temp 98.4°F | Resp 18 | Ht 64.5 in | Wt 200.0 lb

## 2013-11-18 DIAGNOSIS — R059 Cough, unspecified: Secondary | ICD-10-CM

## 2013-11-18 DIAGNOSIS — R05 Cough: Secondary | ICD-10-CM

## 2013-11-18 DIAGNOSIS — J0110 Acute frontal sinusitis, unspecified: Secondary | ICD-10-CM

## 2013-11-18 DIAGNOSIS — J0111 Acute recurrent frontal sinusitis: Secondary | ICD-10-CM

## 2013-11-18 MED ORDER — CEFDINIR 300 MG PO CAPS: 300.0000 mg | ORAL_CAPSULE | Freq: Two times a day (BID) | ORAL | Status: DC

## 2013-11-18 NOTE — Progress Notes (Signed)
[facility] [address] [place] [address] [phone]  Date:  11/18/2013   Name:  [name]   DOB:  1960-12-06   MRN:  [id number]  PCP:  Donnie Coffin, MD    Chief Complaint: URI   History of Present Illness:  [name] is a 53 y.o. very pleasant female patient who presents with the following:  She is here today with a possible URI.  She has been ill for about 4 days now. She first noted a ST, scratchy throat, HA, and a cough over the last couple of days. Her chest will feel tight when she coughs.  She is producing some clear mucus with cough.   She has not noted a fever at home but has not checked.   No GI symptoms. No earache, but she has sore nodes and a headache.    She is pre- diabetic and takes metformin,  Also pravachol  Patient Active Problem List   Diagnosis Date Noted  . VAIN I (vaginal intraepithelial neoplasia grade I)   . Other and unspecified hyperlipidemia 10/22/2013  . Wears glasses   . Arthritis   . Migraine headache   . History of measles, mumps, or rubella   . H/O varicella   . Yeast infection   . H/O bacterial infection   . Back pain   . Borderline diabetic   . Weight gain   . Sleep deprivation   . Fatigue   . Shortness of breath   . Nausea   . Blurred vision   . Menopausal symptoms 10/05/2011  . Prediabetes 10/04/2008  . MIGRAINE HEADACHE 10/04/2008  . CHEST PAIN 10/04/2008    Past Medical History  Diagnosis Date  . Borderline diabetic   . Weight gain   . Sleep deprivation     loss of sleep  . Fatigue   . Shortness of breath     with pain   . Nausea   . Blurred vision   . Wears glasses   . Arthritis   . Migraine headache   . History of measles, mumps, or rubella   . H/O varicella   . Hx of ovarian cyst   . Yeast infection   . H/O bacterial infection   . Back pain   . Depression   . 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  Anxiety   . Diabetes mellitus without complication     Past Surgical History  Procedure Laterality Date  . Abdominal hysterectomy    . Oophorectomy  2009  . Cholecystectomy  11/13/10  . Dilation and curettage of uterus    . Tubal ligation    . Dg gall bladder      History  Substance Use Topics  . Smoking status: Current Some Day Smoker -- 0.50 packs/day    Types: Cigarettes  . Smokeless tobacco: Never Used  . Alcohol Use: No     Comment: socially,  none recently    Family History  Problem Relation Age of Onset  . Hypertension Mother   . Hypotension Mother   . Anemia Mother     low iron  . Heart disease Sister   . Sickle cell trait Other     Allergies  Allergen Reactions  . Sulfa Antibiotics Anaphylaxis, Hives and Swelling    Medication list has been reviewed and updated.  Current Outpatient Prescriptions on File Prior to Visit  Medication Sig Dispense Refill  . cyclobenzaprine (FLEXERIL) 10 MG tablet Take 1 tablet (10 mg total) by mouth 3 (three) times daily as needed for muscle spasms.  30 tablet  0  . estradiol (ESTRACE) 1 MG tablet Take 1 tablet (1 mg total) by mouth daily. Take 1 1/2 mg per day.  60 tablet  0  . HYDROcodone-acetaminophen (NORCO) 5-325 MG per tablet Take 1-2 tablets by mouth every 4 (four) hours as needed for pain.  20 tablet  0  . meloxicam (MOBIC) 7.5 MG tablet Take 7.5 mg by mouth daily.      . metFORMIN (GLUCOPHAGE) 500 MG tablet Take 1 tablet (500 mg total) by mouth 2 (two) times daily with a meal.  60 tablet  3  . naproxen sodium (ANAPROX) 220 MG tablet Take 440 mg by mouth as needed.      . pravastatin (PRAVACHOL) 40 MG tablet Take 1 tablet (40 mg total) by mouth daily.  30 tablet  3   No current facility-administered medications on file prior to visit.    Review of Systems:  As per HPI- otherwise negative.   Physical Examination: Filed Vitals:   11/18/13 1212  BP: 110/78  Pulse: 60  Temp: 98.4 F (36.9 C)  Resp: 18   Filed  Vitals:   11/18/13 1212  Height: 5' 4.5" (1.638 m)  Weight: 200 lb (90.719 kg)   Body mass index is 33.81 kg/(m^2). Ideal Body Weight: Weight in (lb) to have BMI = 25: 147.6  GEN: WDWN, NAD, Non-toxic, A & O x 3, obese, looks well HEENT: Atraumatic, Normocephalic. Neck supple. No masses, No LAD.  Bilateral TM wnl, oropharynx normal.  PEERL,EOMI.   Ears and Nose: No external deformity. CV: RRR, No M/G/R. No JVD. No thrill. No extra heart sounds. PULM: CTA B, no wheezes, crackles, rhonchi. No retractions. No resp. distress. No accessory muscle use. EXTR: No c/c/e NEURO Normal gait.  PSYCH: Normally interactive. Conversant. Not depressed or anxious appearing.  Calm demeanor.    Assessment and Plan: Acute recurrent frontal sinusitis - Plan: cefdinir (OMNICEF) 300 MG capsule  Cough - Plan: cefdinir (OMNICEF) 300 MG capsule  Treat for sinusitis as above.  Follow-up if not better- Sooner if worse.     Signed Lamar Blinks, MD

## 2013-11-18 NOTE — Patient Instructions (Signed)
Use the omnicef as directed for chest and sinus infection.  Let me know if you are not better in the next few days- Sooner if worse.   You can certainly use OTC medications such as tylenol for aches.

## 2013-12-01 ENCOUNTER — Encounter: Payer: Self-pay | Admitting: Obstetrics and Gynecology

## 2013-12-02 ENCOUNTER — Other Ambulatory Visit: Payer: Self-pay | Admitting: *Deleted

## 2013-12-02 MED ORDER — BAYER MICROLET LANCETS MISC: Status: DC

## 2013-12-02 MED ORDER — GLUCOSE BLOOD VI STRP: ORAL_STRIP | Status: DC

## 2013-12-02 NOTE — Telephone Encounter (Signed)
Pt sent MyChart message stating that she needs testing supplies. She uses a [employer].

## 2013-12-18 ENCOUNTER — Other Ambulatory Visit: Payer: Self-pay

## 2013-12-18 MED ORDER — ESTRADIOL 1 MG PO TABS: 1.0000 mg | ORAL_TABLET | Freq: Every day | ORAL | Status: DC

## 2013-12-18 NOTE — Telephone Encounter (Signed)
Last AEX: 10/14/13 Last refill:10/14/13 #60 X 0 Current AEX:10/22/14 Last MMG: 10/21/13 Bi-Rads neg  Please advise

## 2013-12-26 ENCOUNTER — Encounter: Payer: 59 | Attending: Internal Medicine | Admitting: Dietician

## 2013-12-26 ENCOUNTER — Encounter: Payer: Self-pay | Admitting: Dietician

## 2013-12-26 DIAGNOSIS — E669 Obesity, unspecified: Secondary | ICD-10-CM | POA: Diagnosis present

## 2013-12-26 DIAGNOSIS — Z713 Dietary counseling and surveillance: Secondary | ICD-10-CM | POA: Diagnosis not present

## 2013-12-26 NOTE — Progress Notes (Signed)
Patient was seen on 12/26/2013 for the Weight Loss Class at the [facility]. The following learning objectives were met by the patient during this class:   Describe healthy choices in each food group  Describe portion size of foods  Use plate method for meal planning  Demonstrate how to read Nutrition Facts food label  Set realistic goals for weight loss, diet changes, and physical activity.   Goals:  1. Make healthy food choices in each food group.  2. Reduce portion size of foods.  3. Increase fruit and vegetable intake.  4. Use plate method for meal planning.  5. Increase physical activity.    Handouts given:   1. Nutrition Strategies for Weight Loss   2. Meal plan/portion card   3. MyPlate Planner   4. Weight Management Recipe Resources   5. [employer], [employer], [place]

## 2013-12-28 ENCOUNTER — Ambulatory Visit: Payer: Self-pay | Admitting: Dietician

## 2014-01-18 ENCOUNTER — Encounter: Payer: Self-pay | Admitting: Dietician

## 2014-01-22 ENCOUNTER — Ambulatory Visit (INDEPENDENT_AMBULATORY_CARE_PROVIDER_SITE_OTHER): Payer: 59 | Admitting: Family Medicine

## 2014-01-22 ENCOUNTER — Other Ambulatory Visit: Payer: Self-pay | Admitting: Family Medicine

## 2014-01-22 ENCOUNTER — Encounter: Payer: Self-pay | Admitting: Family Medicine

## 2014-01-22 ENCOUNTER — Ambulatory Visit: Payer: Self-pay | Admitting: Internal Medicine

## 2014-01-22 VITALS — BP 118/79 | HR 69 | Temp 98.4°F | Ht 65.0 in | Wt 198.0 lb

## 2014-01-22 DIAGNOSIS — F32A Depression, unspecified: Secondary | ICD-10-CM

## 2014-01-22 DIAGNOSIS — R413 Other amnesia: Secondary | ICD-10-CM | POA: Insufficient documentation

## 2014-01-22 DIAGNOSIS — R519 Headache, unspecified: Secondary | ICD-10-CM

## 2014-01-22 DIAGNOSIS — F329 Major depressive disorder, single episode, unspecified: Secondary | ICD-10-CM

## 2014-01-22 DIAGNOSIS — R51 Headache: Secondary | ICD-10-CM

## 2014-01-22 DIAGNOSIS — R7303 Prediabetes: Secondary | ICD-10-CM

## 2014-01-22 DIAGNOSIS — F419 Anxiety disorder, unspecified: Secondary | ICD-10-CM

## 2014-01-22 DIAGNOSIS — F418 Other specified anxiety disorders: Secondary | ICD-10-CM | POA: Insufficient documentation

## 2014-01-22 DIAGNOSIS — R7309 Other abnormal glucose: Secondary | ICD-10-CM

## 2014-01-22 LAB — POCT GLYCOSYLATED HEMOGLOBIN (HGB A1C): Hemoglobin A1C: 6.3

## 2014-01-22 MED ORDER — DULOXETINE HCL 30 MG PO CPEP: 30.0000 mg | ORAL_CAPSULE | Freq: Every day | ORAL | Status: DC

## 2014-01-22 NOTE — Assessment & Plan Note (Signed)
Chronic recurrent and worsening. MRI brain ordered due to associated memory loss and change in HA symptoms.

## 2014-01-22 NOTE — Assessment & Plan Note (Signed)
Compliant with Metformin. Medication mostly managed by Kerin Ransom. A1C checked today. I will call her with result and to follow up with her endocrinologist for medication adjustment if needed.

## 2014-01-22 NOTE — Assessment & Plan Note (Signed)
FHx of early onset dementia. TSH,HIV, RPR,BMP, Vit B 12 ordered today. MRI brain ordered as well. I will call with result and consider neurology referral depending on test result.

## 2014-01-22 NOTE — Patient Instructions (Addendum)
It was nice seeing you today, I am sorry about your headache as well as memory problem. You also do have depression which can worsen this, please start taking Cymbalta. We will obtain CT/MRi of your brain to assess for memory loss. Please see me back in 4 wks.  Depression Depression refers to feeling sad, low, down in the dumps, blue, gloomy, or empty. In general, there are two kinds of depression: 1. Normal sadness or normal grief. This kind of depression is one that we all feel from time to time after upsetting life experiences, such as the loss of a job or the ending of a relationship. This kind of depression is considered normal, is short lived, and resolves within a few days to 2 weeks. Depression experienced after the loss of a loved one (bereavement) often lasts longer than 2 weeks but normally gets better with time. 2. Clinical depression. This kind of depression lasts longer than normal sadness or normal grief or interferes with your ability to function at home, at work, and in school. It also interferes with your personal relationships. It affects almost every aspect of your life. Clinical depression is an illness. Symptoms of depression can also be caused by conditions other than those mentioned above, such as:  Physical illness. Some physical illnesses, including underactive thyroid gland (hypothyroidism), severe anemia, specific types of cancer, diabetes, uncontrolled seizures, heart and lung problems, strokes, and chronic pain are commonly associated with symptoms of depression.  Side effects of some prescription medicine. In some people, certain types of medicine can cause symptoms of depression.  Substance abuse. Abuse of alcohol and illicit drugs can cause symptoms of depression. SYMPTOMS Symptoms of normal sadness and normal grief include the following:  Feeling sad or crying for short periods of time.  Not caring about anything (apathy).  Difficulty sleeping or sleeping too  much.  No longer able to enjoy the things you used to enjoy.  Desire to be by oneself all the time (social isolation).  Lack of energy or motivation.  Difficulty concentrating or remembering.  Change in appetite or weight.  Restlessness or agitation. Symptoms of clinical depression include the same symptoms of normal sadness or normal grief and also the following symptoms:  Feeling sad or crying all the time.  Feelings of guilt or worthlessness.  Feelings of hopelessness or helplessness.  Thoughts of suicide or the desire to harm yourself (suicidal ideation).  Loss of touch with reality (psychotic symptoms). Seeing or hearing things that are not real (hallucinations) or having false beliefs about your life or the people around you (delusions and paranoia). DIAGNOSIS  The diagnosis of clinical depression is usually based on how bad the symptoms are and how long they have lasted. Your health care provider will also ask you questions about your medical history and substance use to find out if physical illness, use of prescription medicine, or substance abuse is causing your depression. Your health care provider may also order blood tests. TREATMENT  Often, normal sadness and normal grief do not require treatment. However, sometimes antidepressant medicine is given for bereavement to ease the depressive symptoms until they resolve. The treatment for clinical depression depends on how bad the symptoms are but often includes antidepressant medicine, counseling with a mental health professional, or both. Your health care provider will help to determine what treatment is best for you. Depression caused by physical illness usually goes away with appropriate medical treatment of the illness. If prescription medicine is causing depression, talk with  your health care provider about stopping the medicine, decreasing the dose, or changing to another medicine. Depression caused by the abuse of alcohol  or illicit drugs goes away when you stop using these substances. Some adults need professional help in order to stop drinking or using drugs. SEEK IMMEDIATE MEDICAL CARE IF:  You have thoughts about hurting yourself or others.  You lose touch with reality (have psychotic symptoms).  You are taking medicine for depression and have a serious side effect. FOR MORE INFORMATION  National Alliance on Mental Illness: www.nami.[employer]: https://carter.com/ Document Released: 03/02/2000 Document Revised: 07/20/2013 Document Reviewed: 06/04/2011 [facility] Patient Information 2015 [place], [place]. This information is not intended to replace advice given to you by your health care provider. Make sure you discuss any questions you have with your health care provider.

## 2014-01-22 NOTE — Progress Notes (Signed)
Notes faxed to behavioral health.  Will forward to RN to see if she can see referral. Johnney Ou

## 2014-01-22 NOTE — Progress Notes (Signed)
Subjective:     Patient ID: [name], female   DOB: [birth year], 53 y.o.   MRN: [id number]  HPI   Pre DM2:Currently on Metformin 500 mg BID, sees Dr  Cruzita Lederer for DM management. Migraine HA: Hx of headache for years which is worsening over the last 2 months with change in symptoms.  HA is described as pressure like and at times throbbing, currently she is asymptomatic.There is associated loss of concentration, worsening anxiety and nervousness with her headache. Can't be by herself, gets scared easily.  This is making her memory loss worsened. Memory loss: Worsening over 2 months. Has hx of early onset dementia in her father, she is concern she is having similar problem. Depression: Hx of depression and anxiety in the past for which she was on Amitriptyline, Lexapro and Klonopin, she stopped all medication because it makes her feel bad. Now symptom is returning and worsening, she denies suicidal ideation.  Current Outpatient Prescriptions on File Prior to Visit  Medication Sig Dispense Refill  . estradiol (ESTRACE) 1 MG tablet Take 1 tablet (1 mg total) by mouth daily. Take 1 1/2 mg per day. 45 tablet 9  . HYDROcodone-acetaminophen (NORCO) 5-325 MG per tablet Take 1-2 tablets by mouth every 4 (four) hours as needed for pain. 20 tablet 0  . metFORMIN (GLUCOPHAGE) 500 MG tablet Take 1 tablet (500 mg total) by mouth 2 (two) times daily with a meal. 60 tablet 3  . pravastatin (PRAVACHOL) 40 MG tablet Take 1 tablet (40 mg total) by mouth daily. 30 tablet 3  . BAYER MICROLET LANCETS lancets Use to test blood sugar 1 time daily as instructed. 100 each 3  . cyclobenzaprine (FLEXERIL) 10 MG tablet Take 1 tablet (10 mg total) by mouth 3 (three) times daily as needed for muscle spasms. 30 tablet 0  . glucose blood (BAYER CONTOUR TEST) test strip Use to test blood sugar 1 time daily as instructed. 100 each 3  . meloxicam (MOBIC) 7.5 MG tablet Take 7.5 mg by mouth daily.     No current  facility-administered medications on file prior to visit.   Past Medical History  Diagnosis Date  . Borderline diabetic   . Weight gain   . Sleep deprivation     loss of sleep  . Fatigue   . Shortness of breath     with pain   . Nausea   . Blurred vision   . Wears glasses   . Arthritis   . Migraine headache   . History of measles, mumps, or rubella   . H/O varicella   . Hx of ovarian cyst   . Yeast infection   . H/O bacterial infection   . Back pain   . Depression   . 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 Anxiety   . Diabetes mellitus without complication      Review of Systems  Constitutional: Positive for fatigue.  Eyes: Negative for visual disturbance.  Respiratory: Negative.   Cardiovascular: Negative.   Gastrointestinal: Negative.   Neurological: Positive for headaches. Negative for tremors and seizures.  Psychiatric/Behavioral: Positive for decreased concentration. Negative for suicidal ideas, confusion and self-injury. The patient is nervous/anxious.   All other systems reviewed and are negative.  Filed Vitals:   01/22/14 1130  BP: 118/79  Pulse: 69  Temp: 98.4 F (36.9 C)  TempSrc: Oral  Height: 5\' 5"  (1.651 m)  Weight: 198 lb (  89.812 kg)        Objective:   Physical Exam  Constitutional: She is oriented to person, place, and time. She appears well-developed. No distress.  Cardiovascular: Normal rate, regular rhythm, normal heart sounds and intact distal pulses.   No murmur heard. Pulmonary/Chest: Effort normal and breath sounds normal. No respiratory distress. She has no wheezes.  Abdominal: Soft. Bowel sounds are normal. She exhibits no distension and no mass. There is no tenderness.  Musculoskeletal: Normal range of motion. She exhibits no edema.  Neurological: She is alert and oriented to person, place, and time. She has normal strength and normal reflexes. No cranial nerve deficit  or sensory deficit. She displays a negative Romberg sign. GCS eye subscore is 4. GCS verbal subscore is 5. GCS motor subscore is 6. She displays no Babinski's sign on the right side. She displays no Babinski's sign on the left side.  Psychiatric: Her speech is normal and behavior is normal. Judgment and thought content normal. Her mood appears not anxious. Her affect is not angry. Cognition and memory are normal. She does not exhibit a depressed mood. She expresses no homicidal and no suicidal ideation. She expresses no suicidal plans and no homicidal plans.  Nursing note and vitals reviewed.      Assessment:     Pre DM2 Migraine HA Memory loss  Depression with anxiety     Plan:     Check problem list.

## 2014-01-22 NOTE — Assessment & Plan Note (Signed)
Recurrent. Not suicidal. PHQ9 today was 7: Moderate depression. Counseling done and I recommended pharmacologic and psychotherapy. I started her on Cymbalta which will also help with her pain. I referred her to behavioral health. She is to call if no improvement of her symptoms or worsening. She verbalized understanding and agreed with plan.

## 2014-01-23 LAB — BASIC METABOLIC PANEL
BUN: 12 mg/dL (ref 6–23)
CO2: 26 mEq/L (ref 19–32)
Calcium: 9.7 mg/dL (ref 8.4–10.5)
Chloride: 100 mEq/L (ref 96–112)
Creat: 0.82 mg/dL (ref 0.50–1.10)
Glucose, Bld: 91 mg/dL (ref 70–99)
Potassium: 4.2 mEq/L (ref 3.5–5.3)
Sodium: 135 mEq/L (ref 135–145)

## 2014-01-23 LAB — VITAMIN B12: Vitamin B-12: 496 pg/mL (ref 211–911)

## 2014-01-23 LAB — TSH: TSH: 0.619 u[IU]/mL (ref 0.350–4.500)

## 2014-01-23 LAB — RPR

## 2014-01-24 LAB — HIV 1/2 CONFIRMATION
HIV-1 antibody: NEGATIVE
HIV-2 Ab: NEGATIVE

## 2014-01-24 LAB — HIV ANTIBODY (ROUTINE TESTING W REFLEX): HIV 1&2 Ab, 4th Generation: REACTIVE — AB

## 2014-01-25 ENCOUNTER — Telehealth: Payer: Self-pay | Admitting: *Deleted

## 2014-01-25 NOTE — Telephone Encounter (Signed)
LM for patient to call back.  Please help her schedule an appt for this follow up. Shaniya Tashiro,CMA

## 2014-01-25 NOTE — Telephone Encounter (Signed)
-----   Message from Andrena Mews, MD sent at 01/25/2014 10:09 AM EST ----- No. I Just need a follow up to discuss test result with her. Thanks. ----- Message -----    From: Andreas Newport, CMA    Sent: 01/25/2014   9:31 AM      To: Andrena Mews, MD  Did you make her aware of results already?  [name],CMA  ----- Message -----    From: Andrena Mews, MD    Sent: 01/25/2014   8:50 AM      To: [facility]  Please call patient to follow up to discuss test result in the next 1-2 wks. Thanks.

## 2014-01-26 ENCOUNTER — Encounter (HOSPITAL_COMMUNITY): Payer: Self-pay | Admitting: Emergency Medicine

## 2014-01-26 ENCOUNTER — Emergency Department (HOSPITAL_COMMUNITY)
Admission: EM | Admit: 2014-01-26 | Discharge: 2014-01-27 | Disposition: A | Discharge status: Home / Self Care | Payer: 59 | Attending: Emergency Medicine | Admitting: Emergency Medicine

## 2014-01-26 DIAGNOSIS — Z8742 Personal history of other diseases of the female genital tract: Secondary | ICD-10-CM | POA: Diagnosis not present

## 2014-01-26 DIAGNOSIS — E119 Type 2 diabetes mellitus without complications: Secondary | ICD-10-CM | POA: Insufficient documentation

## 2014-01-26 DIAGNOSIS — G4489 Other headache syndrome: Secondary | ICD-10-CM | POA: Diagnosis not present

## 2014-01-26 DIAGNOSIS — Z87891 Personal history of nicotine dependence: Secondary | ICD-10-CM | POA: Diagnosis not present

## 2014-01-26 DIAGNOSIS — Z8619 Personal history of other infectious and parasitic diseases: Secondary | ICD-10-CM | POA: Diagnosis not present

## 2014-01-26 DIAGNOSIS — Z791 Long term (current) use of non-steroidal anti-inflammatories (NSAID): Secondary | ICD-10-CM | POA: Diagnosis not present

## 2014-01-26 DIAGNOSIS — G43909 Migraine, unspecified, not intractable, without status migrainosus: Secondary | ICD-10-CM | POA: Diagnosis present

## 2014-01-26 DIAGNOSIS — F329 Major depressive disorder, single episode, unspecified: Secondary | ICD-10-CM | POA: Insufficient documentation

## 2014-01-26 DIAGNOSIS — Z79899 Other long term (current) drug therapy: Secondary | ICD-10-CM | POA: Diagnosis not present

## 2014-01-26 DIAGNOSIS — M1990 Unspecified osteoarthritis, unspecified site: Secondary | ICD-10-CM | POA: Insufficient documentation

## 2014-01-26 DIAGNOSIS — F419 Anxiety disorder, unspecified: Secondary | ICD-10-CM | POA: Insufficient documentation

## 2014-01-26 MED ORDER — CLONAZEPAM 0.5 MG PO TABS
0.5000 mg | ORAL_TABLET | Freq: Once | ORAL | Status: AC
  Administered 2014-01-26: 0.5 mg via ORAL
  Filled 2014-01-26: qty 1

## 2014-01-26 MED ORDER — ONDANSETRON 8 MG PO TBDP
8.0000 mg | ORAL_TABLET | Freq: Once | ORAL | Status: AC
  Administered 2014-01-26: 8 mg via ORAL
  Filled 2014-01-26: qty 1

## 2014-01-26 MED ORDER — CLONAZEPAM 0.5 MG PO TABS: 0.5000 mg | ORAL_TABLET | Freq: Two times a day (BID) | ORAL | Status: DC | PRN

## 2014-01-26 MED ORDER — ONDANSETRON 8 MG PO TBDP: 8.0000 mg | ORAL_TABLET | Freq: Three times a day (TID) | ORAL | Status: DC | PRN

## 2014-01-26 MED ORDER — HYDROCODONE-ACETAMINOPHEN 5-325 MG PO TABS
2.0000 | ORAL_TABLET | Freq: Once | ORAL | Status: AC
  Administered 2014-01-26: 2 via ORAL
  Filled 2014-01-26: qty 2

## 2014-01-26 MED ORDER — HYDROCODONE-ACETAMINOPHEN 5-325 MG PO TABS: 1.0000 | ORAL_TABLET | ORAL | Status: DC | PRN

## 2014-01-26 NOTE — ED Notes (Signed)
Pt states that she has just began in the last few minutes to have mid sternal chest pressure; pt states that it lsted for several minutes; pt denies pressure currently; VSS; Dr Eulis Foster notified

## 2014-01-26 NOTE — ED Provider Notes (Signed)
CSN: [id number]     Arrival date & time 01/26/14  1829 History   First MD Initiated Contact with Patient 01/26/14 2045     Chief Complaint  Patient presents with  . Migraine     (Consider location/radiation/quality/duration/timing/severity/associated sxs/prior Treatment) HPI   [name] is a 53 y.o. femaleWas here for evaluation of ongoing headache which is present every day while she is awake, and waxes and wanes somewhat, for 2-3 weeks.  She is receiving her doctor for it, been scheduled for an MRI and at the doctor's plans on referring her to neurology for evaluation.  The patient calls her headache a "migraine" although she has never been diagnosed for it.  She is not currently taking anything for it.  She stopped taking her medications, for anxiety, 1 year ago.  She denies fever, chills, nausea, vomiting, paresthesias, focal weakness, or back pain.  She is taking her regular medicines as prescribed.  There are no other known modifying factors.   Past Medical History  Diagnosis Date  . Borderline diabetic   . Weight gain   . Sleep deprivation     loss of sleep  . Fatigue   . Shortness of breath     with pain   . Nausea   . Blurred vision   . Wears glasses   . Arthritis   . Migraine headache   . History of measles, mumps, or rubella   . H/O varicella   . Hx of ovarian cyst   . Yeast infection   . H/O bacterial infection   . Back pain   . Depression   . 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 Anxiety   . Diabetes mellitus without complication    Past Surgical History  Procedure Laterality Date  . Abdominal hysterectomy    . Oophorectomy  2009  . Cholecystectomy  11/13/10  . Dilation and curettage of uterus    . Tubal ligation    . Dg gall bladder     Family History  Problem Relation Age of Onset  . Hypertension Mother   . Hypotension Mother   . Anemia Mother     low iron  . Heart disease  Sister   . Sickle cell trait Other    History  Substance Use Topics  . Smoking status: Former Smoker -- 0.50 packs/day    Types: Cigarettes  . Smokeless tobacco: Never Used  . Alcohol Use: No     Comment: socially,  none recently   OB History    Gravida Para Term Preterm AB TAB SAB Ectopic Multiple Living   3 2 2  1     2      Review of Systems  All other systems reviewed and are negative.     Allergies  Sulfa antibiotics  Home Medications   Prior to Admission medications   Medication Sig Start Date End Date Taking? Authorizing Provider  acetaminophen (TYLENOL) 650 MG CR tablet Take 1,300 mg by mouth every 8 (eight) hours as needed for pain.   Yes Historical Provider, MD  DULoxetine (CYMBALTA) 30 MG capsule Take 1 capsule (30 mg total) by mouth daily. 01/22/14  Yes Andrena Mews, MD  estradiol (ESTRACE) 1 MG tablet Take 1 tablet (1 mg total) by mouth daily. Take 1 1/2 mg per day. 12/18/13  Yes Brook E Amundson de Berton Lan, MD  HYDROcodone-acetaminophen [facility]) 5-325 MG per tablet Take  1-2 tablets by mouth every 4 (four) hours as needed for pain. 10/20/12  Yes Peter S Dammen, PA-C  metFORMIN (GLUCOPHAGE) 500 MG tablet Take 1 tablet (500 mg total) by mouth 2 (two) times daily with a meal. 10/22/13  Yes Philemon Kingdom, MD  naproxen sodium (ANAPROX) 220 MG tablet Take 440 mg by mouth 2 (two) times daily as needed (pain).   Yes Historical Provider, MD  pravastatin (PRAVACHOL) 40 MG tablet Take 1 tablet (40 mg total) by mouth daily. Patient taking differently: Take 40 mg by mouth every morning.  10/22/13  Yes Philemon Kingdom, MD  BAYER MICROLET LANCETS lancets Use to test blood sugar 1 time daily as instructed. 12/02/13   Philemon Kingdom, MD  cyclobenzaprine (FLEXERIL) 10 MG tablet Take 1 tablet (10 mg total) by mouth 3 (three) times daily as needed for muscle spasms. 12/27/12   Angelica Ran, MD  glucose blood (BAYER CONTOUR TEST) test strip Use to test blood sugar 1 time  daily as instructed. 12/02/13   Philemon Kingdom, MD  meloxicam (MOBIC) 7.5 MG tablet Take 7.5 mg by mouth daily.    Historical Provider, MD   BP 147/83 mmHg  Pulse 63  Temp(Src) 98 F (36.7 C) (Oral)  Resp 17  Wt 197 lb (89.359 kg)  SpO2 99% Physical Exam  Constitutional: She is oriented to person, place, and time. She appears well-developed and well-nourished. No distress.  HENT:  Head: Normocephalic and atraumatic.  Right Ear: External ear normal.  Left Ear: External ear normal.  Eyes: Conjunctivae and EOM are normal. Pupils are equal, round, and reactive to light.  Neck: Normal range of motion and phonation normal. Neck supple.  No meningismus  Cardiovascular: Normal rate, regular rhythm and normal heart sounds.   Pulmonary/Chest: Effort normal and breath sounds normal. She exhibits no bony tenderness.  Abdominal: Soft. There is no tenderness.  Musculoskeletal: Normal range of motion.  Neurological: She is alert and oriented to person, place, and time. No cranial nerve deficit or sensory deficit. She exhibits normal muscle tone. Coordination normal.  Skin: Skin is warm, dry and intact.  Psychiatric: She has a normal mood and affect. Her behavior is normal. Judgment and thought content normal.  Nursing note and vitals reviewed.   ED Course  Procedures (including critical care time)  Medications  clonazePAM (KLONOPIN) tablet 0.5 mg (0.5 mg Oral Given 01/26/14 2219)  HYDROcodone-acetaminophen (NORCO/VICODIN) 5-325 MG per tablet 2 tablet (2 tablets Oral Given 01/26/14 2220)  ondansetron (ZOFRAN-ODT) disintegrating tablet 8 mg (8 mg Oral Given 01/26/14 2219)    Patient Vitals for the past 24 hrs:  BP Temp Temp src Pulse Resp SpO2 Weight  01/26/14 2237 147/83 mmHg - - 63 17 99 % -  01/26/14 2056 109/77 mmHg - - (!) 59 16 99 % -  01/26/14 1841 144/94 mmHg 98 F (36.7 C) Oral 70 16 98 % 197 lb (89.359 kg)    11:56 PM Reevaluation with update and discussion. After initial  assessment and treatment, an updated evaluation reveals she is feeling better. [place] Review Labs Reviewed - No data to display  Imaging Review No results found.   EKG Interpretation None      MDM   Final diagnoses:  Other headache syndrome  Anxiety    Nonspecific headache, doubt meningitis, CVA, sinusitis, or cervical neuropathy.  Suspect anxiety related headache.   Nursing Notes Reviewed/ Care Coordinated Applicable Imaging Reviewed Interpretation of Laboratory Data incorporated into ED treatment  The patient appears reasonably screened and/or stabilized for discharge and I doubt any other medical condition or other [facility] requiring further screening, evaluation, or treatment in the ED at this time prior to discharge.  Plan: Home Medications- Norco, Zofran, Klonapin; Home Treatments- rest; return here if the recommended treatment, does not improve the symptoms; Recommended follow up- PCP as scheduled    Richarda Blade, MD 01/27/14 0003

## 2014-01-26 NOTE — ED Notes (Signed)
Pt states that she has a hx of migraines; pt states that they have been getting worse recently; pt states that she saw her PCP and is scheduled for an MRI and CT scan on 11/24; PCP advised that after the testing she was going to refer her to a Neurologist; pt states that she has had the current headache x 1 week; pt states that she has photo and sound sensitivity; pt c/o nausea and vomiting x 2; pt states that it feels difficult to concentrate due to pain

## 2014-01-26 NOTE — ED Notes (Signed)
Pt alert, arrives from home, c/o migraine headache, onset was a week ago, denies taking medication pta, ambulates to triage, speech clear, states "i feel nervous and jittery", resp even unlabored, skin pwd

## 2014-01-26 NOTE — Discharge Instructions (Signed)

## 2014-01-26 NOTE — ED Notes (Signed)
Family come out to desk and states "Is anyone going to come see her and do something"; RN advised that pt has been seen by the RN and the EMT and an assessment has been done; RN advised that the MD has signed up for patient and that he would be in as soon as he was able; Family upset that it is taking time and RN apoliogized and advised that the MD would be in ASAP

## 2014-01-28 LAB — HIV-1 RNA, QUALITATIVE, TMA: HIV-1 RNA, Qualitative, TMA: NOT DETECTED

## 2014-01-29 ENCOUNTER — Ambulatory Visit (INDEPENDENT_AMBULATORY_CARE_PROVIDER_SITE_OTHER): Payer: 59 | Admitting: Family Medicine

## 2014-01-29 ENCOUNTER — Encounter: Payer: Self-pay | Admitting: Family Medicine

## 2014-01-29 VITALS — BP 129/83 | HR 73 | Temp 98.0°F | Ht 65.0 in | Wt 198.0 lb

## 2014-01-29 DIAGNOSIS — Z7689 Persons encountering health services in other specified circumstances: Secondary | ICD-10-CM | POA: Insufficient documentation

## 2014-01-29 DIAGNOSIS — G43809 Other migraine, not intractable, without status migrainosus: Secondary | ICD-10-CM

## 2014-01-29 DIAGNOSIS — F418 Other specified anxiety disorders: Secondary | ICD-10-CM

## 2014-01-29 DIAGNOSIS — Z0000 Encounter for general adult medical examination without abnormal findings: Secondary | ICD-10-CM

## 2014-01-29 MED ORDER — TOPIRAMATE 50 MG PO TABS: 50.0000 mg | ORAL_TABLET | Freq: Two times a day (BID) | ORAL | Status: DC

## 2014-01-29 NOTE — Assessment & Plan Note (Signed)
Result from last visit reviewed and discussed with her. 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 All questions were answered.

## 2014-01-29 NOTE — Addendum Note (Signed)
Addended by: Andrena Mews T on: 01/29/2014 11:27 AM   Modules accepted: Orders

## 2014-01-29 NOTE — Assessment & Plan Note (Signed)
May be contributing to her headache. Cannot tell any major improvement since she started Cymbalta but she will monitor. List of other psychologist give. SI also referred her to behavioral health during last visit.

## 2014-01-29 NOTE — Patient Instructions (Signed)
It was nice to see you today, I am sorry you still have headache, this could be related to stress, anxiety or Migraine, I will have you start topamax for Migraine, I also referred you to a headache specialist. Please remember to get your brain MRI to ensure nothing else is going on, I will like to see you back in 4-8 wks.

## 2014-01-29 NOTE — Progress Notes (Signed)
Subjective:     Patient ID: [name], female   DOB: Jul 12, 1960, 53 y.o.   MRN: [id number]  Migraine  This is a chronic problem. The current episode started more than 1 year ago. The problem occurs constantly. The problem has been gradually worsening. The pain is located in the bilateral and frontal region. The pain does not radiate. The pain quality is similar to prior headaches (But now constant). The quality of the pain is described as aching and throbbing. The pain is at a severity of 7/10. The pain is moderate. Associated symptoms include nausea, photophobia and vomiting. Pertinent negatives include no abnormal behavior, anorexia, blurred vision, dizziness, eye watering, fever, hearing loss, numbness or seizures. Exacerbated by: Stress, computer screen. She has tried NSAIDs and oral narcotics for the symptoms. The treatment provided mild relief. Her past medical history is significant for migraine headaches. There is no history of hypertension.  Anxiety/Depression:Patient started Cymbalta which she she cannot tell so far if there is any major improvement, she does go to a psychologist for therapy in addition to medication, she feels she might be better if she change her psychologist. No suicidal or homicidal thoughts. HM: Here to follow up with result from her last visit.  Current Outpatient Prescriptions on File Prior to Visit  Medication Sig Dispense Refill  . acetaminophen (TYLENOL) 650 MG CR tablet Take 1,300 mg by mouth every 8 (eight) hours as needed for pain.    Marland Kitchen BAYER MICROLET LANCETS lancets Use to test blood sugar 1 time daily as instructed. 100 each 3  . clonazePAM (KLONOPIN) 0.5 MG tablet Take 1 tablet (0.5 mg total) by mouth 2 (two) times daily as needed for anxiety. 30 tablet 0  . cyclobenzaprine (FLEXERIL) 10 MG tablet Take 1 tablet (10 mg total) by mouth 3 (three) times daily as needed for muscle spasms. 30 tablet 0  . DULoxetine (CYMBALTA) 30 MG capsule Take 1 capsule (30  mg total) by mouth daily. 30 capsule 1  . estradiol (ESTRACE) 1 MG tablet Take 1 tablet (1 mg total) by mouth daily. Take 1 1/2 mg per day. 45 tablet 9  . glucose blood (BAYER CONTOUR TEST) test strip Use to test blood sugar 1 time daily as instructed. 100 each 3  . HYDROcodone-acetaminophen (NORCO) 5-325 MG per tablet Take 1-2 tablets by mouth every 4 (four) hours as needed. 20 tablet 0  . meloxicam (MOBIC) 7.5 MG tablet Take 7.5 mg by mouth daily.    . metFORMIN (GLUCOPHAGE) 500 MG tablet Take 1 tablet (500 mg total) by mouth 2 (two) times daily with a meal. 60 tablet 3  . naproxen sodium (ANAPROX) 220 MG tablet Take 440 mg by mouth 2 (two) times daily as needed (pain).    . ondansetron (ZOFRAN ODT) 8 MG disintegrating tablet Take 1 tablet (8 mg total) by mouth every 8 (eight) hours as needed for nausea or vomiting. 20 tablet 0  . pravastatin (PRAVACHOL) 40 MG tablet Take 1 tablet (40 mg total) by mouth daily. (Patient taking differently: Take 40 mg by mouth every morning. ) 30 tablet 3   No current facility-administered medications on file prior to visit.   Past Medical History  Diagnosis Date  . Borderline diabetic   . Weight gain   . Sleep deprivation     loss of sleep  . Fatigue   . Shortness of breath     with pain   . Nausea   . Blurred vision   .  Wears glasses   . Arthritis   . Migraine headache   . History of measles, mumps, or rubella   . H/O varicella   . Hx of ovarian cyst   . Yeast infection   . H/O bacterial infection   . Back pain   . Depression   . 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 Anxiety   . Diabetes mellitus without complication       Review of Systems  Constitutional: Negative for fever.  HENT: Negative.  Negative for hearing loss.   Eyes: Positive for photophobia. Negative for blurred vision.  Respiratory: Negative.   Cardiovascular: Negative.   Gastrointestinal: Positive for  nausea and vomiting. Negative for anorexia.  Genitourinary: Negative.   Neurological: Negative for dizziness, seizures and numbness.  All other systems reviewed and are negative.  Filed Vitals:   01/29/14 0914  BP: 129/83  Pulse: 73  Temp: 98 F (36.7 C)  TempSrc: Oral  Height: 5\' 5"  (1.651 m)  Weight: 198 lb (89.812 kg)       Objective:   Physical Exam  Constitutional: She appears well-developed. No distress.  Cardiovascular: Normal rate, regular rhythm, normal heart sounds and intact distal pulses.   No murmur heard. Pulmonary/Chest: Effort normal and breath sounds normal. No respiratory distress. She has no wheezes.  Abdominal: Soft. Bowel sounds are normal. She exhibits no distension and no mass. There is no tenderness.  Musculoskeletal: Normal range of motion. She exhibits no edema.  Neurological: She has normal strength. She displays normal reflexes. No cranial nerve deficit or sensory deficit. She displays a negative Romberg sign. She displays no Babinski's sign on the right side. She displays no Babinski's sign on the left side.  Psychiatric: Her speech is normal and behavior is normal. Thought content normal. Her mood appears anxious. Cognition and memory are normal. She does not exhibit a depressed mood.  Nursing note and vitals reviewed.      Assessment:     Chronic headache: Migraine Anxiety/Depression Health maintenance     Plan:     Check problem list.

## 2014-01-29 NOTE — Assessment & Plan Note (Signed)
Worsening. I recommended decadron and Toradol shots today but she declined. Topamax started for her HA. I also referred her to a HA specialist since this has been going on for years and now worsening. She has MRI scheduled for the next few weeks. Return precaution given.

## 2014-02-04 ENCOUNTER — Encounter: Payer: Self-pay | Admitting: Internal Medicine

## 2014-02-04 ENCOUNTER — Ambulatory Visit (INDEPENDENT_AMBULATORY_CARE_PROVIDER_SITE_OTHER): Payer: 59 | Admitting: Internal Medicine

## 2014-02-04 VITALS — BP 104/62 | HR 88 | Temp 98.6°F | Resp 12 | Wt 198.0 lb

## 2014-02-04 DIAGNOSIS — E785 Hyperlipidemia, unspecified: Secondary | ICD-10-CM

## 2014-02-04 DIAGNOSIS — R7309 Other abnormal glucose: Secondary | ICD-10-CM

## 2014-02-04 DIAGNOSIS — R7303 Prediabetes: Secondary | ICD-10-CM

## 2014-02-04 NOTE — Progress Notes (Signed)
Patient ID: [name], female   DOB: Jul 28, 1960, 53 y.o.   MRN: [id number]  HPI: [name] is a 53 y.o.-year-old female, initially referred by her PCP, Dr. Judeth Horn, for management of prediabetes, dx early 2000s, controlled, without complications and hyperlipidemia.  Last hemoglobin A1c was: Lab Results  Component Value Date   HGBA1C 6.3 01/22/2014   HGBA1C 6.4* 10/14/2013   HGBA1C 6.1* 10/05/2011   We started Metformin 500 mg bid at last visit. She initially had diarrhea with it, now resolved.  Pt checks sugars 0-1x a day: - am: 88-109 No lows. Lowest sugar was 97; ? she has hypoglycemia awareness  She has a OneTouch Ultra.  Pt's meals are: better now, after she saw nutrition and [facility]. She also tries to walk.  - no CKD, last BUN/creatinine:  Lab Results  Component Value Date   BUN 12 01/22/2014   CREATININE 0.82 01/22/2014   - last eye exam was in 03-06/2013. No DR. She has blurry vision. - no numbness and tingling in feet.  HL: Reviewed last 2 sets of lipids:  Lab Results  Component Value Date   CHOL 234* 10/14/2013   CHOL 240* 10/05/2011   Lab Results  Component Value Date   HDL 51 10/14/2013   HDL 49 10/05/2011   Lab Results  Component Value Date   LDLCALC 156* 10/14/2013   LDLCALC 163* 10/05/2011   Lab Results  Component Value Date   TRIG 136 10/14/2013   TRIG 142 10/05/2011   Lab Results  Component Value Date   CHOLHDL 4.6 10/14/2013   CHOLHDL 4.9 10/05/2011   Her last LFTs are normal: Lab Results  Component Value Date   ALT 9 10/14/2013   AST 13 10/14/2013   ALKPHOS 66 10/14/2013   BILITOT 0.3 10/14/2013   We started Pravastatin 40 mg at last visit.  Has FH of HL in sister.  She was eating mostly fried foods, this improved lately after she saw nutrition. She also took her husband to some of the classes, so they can follow the diet together.  ROS: Constitutional:no weight gain or loss, + fatigue, no subjective hyperthermia,  + poor sleep Eyes: No blurry vision, no xerophthalmia ENT: no sore throat, no nodules palpated in throat, no dysphagia/odynophagia, no hoarseness Cardiovascular: no CP/SOB/palpitations/leg swelling Respiratory: no cough/SOB Gastrointestinal: + N/+ V/no D/C Musculoskeletal: no muscle/joint aches Skin: no rashes Neurological: no tremors/numbness/tingling/dizziness, + HA + Low libido  I reviewed pt's medications, allergies, PMH, social hx, family hx and no changes required, except as mentioned above.  PE: BP 104/62 mmHg  Pulse 88  Temp(Src) 98.6 F (37 C) (Oral)  Resp 12  Wt 198 lb (89.812 kg)  SpO2 97% Wt Readings from Last 3 Encounters:  02/04/14 198 lb (89.812 kg)  01/29/14 198 lb (89.812 kg)  01/26/14 197 lb (89.359 kg)   Constitutional: overweight, in NAD Eyes: PERRLA, EOMI, no exophthalmos ENT: moist mucous membranes, no thyromegaly, no cervical lymphadenopathy Cardiovascular: RRR, No MRG Respiratory: CTA B Gastrointestinal: abdomen soft, NT, ND, BS+ Musculoskeletal: no deformities, strength intact in all 4 Skin: moist, warm, no rashes Neurological: no tremor with outstretched hands, DTR normal in all 4  ASSESSMENT: 1. Prediabetes  2. HL  PLAN:  1. Patient has a history of prediabetes, for which we started metformin 500 mg twice a day at last visit. Her hemoglobin A1c decreased as per review of her level obtained 2 weeks ago. We reviewed her meter download and her sugars are all  at goal. - I suggested to continue Metformin 500 mg 2x a day - I again advised her to start checking sugars at different times of the day - check once a day or every other day, rotating checks - given more sugar logs  - advised for yearly eye exams - she is up-to-date - She improved her diet after seeing nutrition, encouraged her to continue - Return to clinic in 4 mo with sugar log  2. HL - Pt with high LDL, also prediabetic, obese - At last visit, I advised to start Pravachol 40 mg at  night >> she tolerates the medication well >> will continue this dose - will check LFTs and Lipids today  Component     Latest Ref Rng 02/05/2014  Total Protein     6.0 - 8.5 g/dL 6.9  Albumin     3.5 - 5.5 g/dL 3.9  Total Bilirubin     0.0 - 1.2 mg/dL <0.2  Bilirubin, Direct     0.00 - 0.40 mg/dL 0.03  Alkaline Phosphatase     39 - 117 IU/L 83  AST     0 - 40 IU/L 13  ALT     0 - 32 IU/L 8  Cholesterol, Total     100 - 199 mg/dL 197  Triglycerides     0 - 149 mg/dL 177 (H)  HDL     >39 mg/dL 45  VLDL Cholesterol Cal     5 - 40 mg/dL 35  LDL (calc)     0 - 99 mg/dL 117 (H)  Total CHOL/HDL Ratio     0.0 - 4.4 ratio units 4.4   LDL improved, LFTs normal.

## 2014-02-04 NOTE — Patient Instructions (Signed)
Please continue Metformin 500 mg 2x a day. Continue Pravastatin 40 mg daily.  Please come back for a follow-up appointment in 4 months.  Please go to the lab.

## 2014-02-05 ENCOUNTER — Other Ambulatory Visit: Payer: Self-pay | Admitting: Internal Medicine

## 2014-02-06 LAB — LIPID PANEL
Chol/HDL Ratio: 4.4 ratio units (ref 0.0–4.4)
Cholesterol, Total: 197 mg/dL (ref 100–199)
HDL: 45 mg/dL (ref >39)
LDL Calculated: 117 mg/dL — ABNORMAL HIGH (ref 0–99)
Triglycerides: 177 mg/dL — ABNORMAL HIGH (ref 0–149)
VLDL Cholesterol Cal: 35 mg/dL (ref 5–40)

## 2014-02-06 LAB — HEPATIC FUNCTION PANEL
ALT: 8 IU/L (ref 0–32)
AST: 13 IU/L (ref 0–40)
Albumin: 3.9 g/dL (ref 3.5–5.5)
Alkaline Phosphatase: 83 IU/L (ref 39–117)
Bilirubin, Direct: 0.03 mg/dL (ref 0.00–0.40)
Total Bilirubin: <0.2 mg/dL (ref 0.0–1.2)
Total Protein: 6.9 g/dL (ref 6.0–8.5)

## 2014-02-08 ENCOUNTER — Ambulatory Visit (HOSPITAL_COMMUNITY): Payer: 59

## 2014-02-09 ENCOUNTER — Ambulatory Visit (HOSPITAL_COMMUNITY)
Admission: RE | Admit: 2014-02-09 | Discharge: 2014-02-09 | Disposition: A | Discharge status: Home / Self Care | Payer: 59 | Source: Ambulatory Visit | Attending: Family Medicine | Admitting: Family Medicine

## 2014-02-09 ENCOUNTER — Ambulatory Visit: Payer: Self-pay | Admitting: Family Medicine

## 2014-02-09 DIAGNOSIS — R519 Headache, unspecified: Secondary | ICD-10-CM

## 2014-02-09 DIAGNOSIS — H0520 Unspecified exophthalmos: Secondary | ICD-10-CM | POA: Insufficient documentation

## 2014-02-09 DIAGNOSIS — R413 Other amnesia: Secondary | ICD-10-CM

## 2014-02-09 DIAGNOSIS — R51 Headache: Secondary | ICD-10-CM

## 2014-02-09 MED ORDER — GADOBENATE DIMEGLUMINE 529 MG/ML IV SOLN
19.0000 mL | Freq: Once | INTRAVENOUS | Status: AC | PRN
  Administered 2014-02-09: 19 mL via INTRAVENOUS

## 2014-02-10 ENCOUNTER — Encounter: Payer: Self-pay | Admitting: Family Medicine

## 2014-02-12 ENCOUNTER — Telehealth: Payer: Self-pay | Admitting: Family Medicine

## 2014-02-12 NOTE — Telephone Encounter (Signed)
After hours line  Patient calling in asking for a refill on Klonopin. She states that she was given a prescription for Klonopin in the ER a few weeks ago but then her PCP gave her prescription for Cymbalta instead. She prefers the effects of Klonopin versus Cymbalta.  I explained that this is a controlled substance and that I cannot refill it over the phone. She asked what else can be done. I recommended that if she had to have a refill on the medicine due to anxiety she could seek care at urgent care. I did explain that it is a controlled substance and that they're not necessarily required to give it to her.  I explained I will send a message to her PCP and encouraged her to keep her behavioral health appointment on December 2.  Laroy Apple, MD [place] Resident, PGY-3 02/12/2014, 1:19 PM

## 2014-02-15 ENCOUNTER — Telehealth: Payer: Self-pay | Admitting: *Deleted

## 2014-02-15 NOTE — Telephone Encounter (Signed)
Continue current regimen for headache. Rest as needed to help reduce stress level.

## 2014-02-15 NOTE — Telephone Encounter (Signed)
-----   Message from Andrena Mews, MD sent at 02/15/2014  8:44 AM EST ----- Please call to advise patient to talk with her Psychiatrist about her anxiety medication since she has an appointment coming up with them soon.  She called over the weekend requesting to be started on Klonopin.   Dr Gwendlyn Deutscher.

## 2014-02-15 NOTE — Telephone Encounter (Signed)
Please inform patient of message from MD. [name]

## 2014-02-15 NOTE — Telephone Encounter (Signed)
Pt is aware of this and will plan to speak with her psychiatrist.  She states that she has an appt with the Headache clinic on 04/06/14 and is on the cancellation for a sooner appt.  Would like to know if there is anything she should be doing in the meantime since imaging came back normal.  [name],CMA

## 2014-02-17 ENCOUNTER — Encounter (HOSPITAL_COMMUNITY): Payer: Self-pay | Admitting: Psychiatry

## 2014-02-17 ENCOUNTER — Ambulatory Visit (INDEPENDENT_AMBULATORY_CARE_PROVIDER_SITE_OTHER): Payer: 59 | Admitting: Psychiatry

## 2014-02-17 VITALS — BP 119/76 | HR 86 | Ht 65.5 in | Wt 200.6 lb

## 2014-02-17 DIAGNOSIS — F331 Major depressive disorder, recurrent, moderate: Secondary | ICD-10-CM

## 2014-02-17 MED ORDER — BUPROPION HCL ER (XL) 150 MG PO TB24: 150.0000 mg | ORAL_TABLET | Freq: Every day | ORAL | Status: DC

## 2014-02-17 MED ORDER — CLONAZEPAM 0.5 MG PO TABS
0.5000 mg | ORAL_TABLET | Freq: Every day | ORAL | Status: DC | PRN
Start: 2014-02-17 — End: 2014-05-03
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
## 2014-02-23 ENCOUNTER — Other Ambulatory Visit: Payer: Self-pay | Admitting: *Deleted

## 2014-02-23 DIAGNOSIS — F331 Major depressive disorder, recurrent, moderate: Secondary | ICD-10-CM

## 2014-02-25 ENCOUNTER — Ambulatory Visit (HOSPITAL_COMMUNITY): Payer: Self-pay | Admitting: Clinical

## 2014-02-26 ENCOUNTER — Ambulatory Visit (INDEPENDENT_AMBULATORY_CARE_PROVIDER_SITE_OTHER): Payer: 59 | Admitting: Family Medicine

## 2014-02-26 ENCOUNTER — Encounter: Payer: Self-pay | Admitting: Family Medicine

## 2014-02-26 VITALS — BP 122/81 | HR 76 | Temp 98.6°F | Ht 65.0 in | Wt 198.0 lb

## 2014-02-26 DIAGNOSIS — G43009 Migraine without aura, not intractable, without status migrainosus: Secondary | ICD-10-CM

## 2014-02-26 DIAGNOSIS — F418 Other specified anxiety disorders: Secondary | ICD-10-CM

## 2014-02-26 NOTE — Assessment & Plan Note (Signed)
Stable of Topamax. MRI brain reviewed and was discussed with her. This was negative for intracranial pathology. Tylenol prn headache. I encouraged her to follow up with headache specialist for further management.

## 2014-02-26 NOTE — Assessment & Plan Note (Signed)
Improving on Wellbutrin. F/U Psych next week. Return precaution discussed. Not currently suicidal.

## 2014-02-26 NOTE — Progress Notes (Signed)
Subjective:     Patient ID: [name], female   DOB: 1961-02-18, 53 y.o.   MRN: [id number]  HPI  Chronic headache: Patient here for follow up, she is now on Topamax which has improved her headache but she continues to have daily headache. Pain today is 5/10 and tolerable, but at times it can get up to 10/10 in severity. Denies N/V, no dizziness. She has appointment scheduled with [facility] specialist next Tuesday. No new symptoms. Depression/Anxiety: She was seen by psychiatrist last week and was started on Wellbutrin and Klonopin. The Wellbutrin is working but makes her feel drowsy at time, but her mood has improved a lot. She does not take her Klonopin often. Denies suicidal ideation.  Current Outpatient Prescriptions on File Prior to Visit  Medication Sig Dispense Refill  . buPROPion (WELLBUTRIN XL) 150 MG 24 hr tablet Take 1 tablet (150 mg total) by mouth daily. 15 tablet 2  . estradiol (ESTRACE) 1 MG tablet Take 1 tablet (1 mg total) by mouth daily. Take 1 1/2 mg per day. 45 tablet 9  . HYDROcodone-acetaminophen (NORCO) 5-325 MG per tablet Take 1-2 tablets by mouth every 4 (four) hours as needed. 20 tablet 0  . metFORMIN (GLUCOPHAGE) 500 MG tablet Take 1 tablet (500 mg total) by mouth 2 (two) times daily with a meal. 60 tablet 3  . pravastatin (PRAVACHOL) 40 MG tablet Take 1 tablet (40 mg total) by mouth daily. (Patient taking differently: Take 40 mg by mouth every morning. ) 30 tablet 3  . acetaminophen (TYLENOL) 650 MG CR tablet Take 1,300 mg by mouth every 8 (eight) hours as needed for pain.    Marland Kitchen BAYER MICROLET LANCETS lancets Use to test blood sugar 1 time daily as instructed. 100 each 3  . clonazePAM (KLONOPIN) 0.5 MG tablet Take 1 tablet (0.5 mg total) by mouth daily as needed for anxiety. (Patient not taking: Reported on 02/26/2014) 30 tablet 0  . glucose blood (BAYER CONTOUR TEST) test strip Use to test blood sugar 1 time daily as instructed. 100 each 3  . ondansetron (ZOFRAN ODT) 8 MG  disintegrating tablet Take 1 tablet (8 mg total) by mouth every 8 (eight) hours as needed for nausea or vomiting. (Patient not taking: Reported on 02/26/2014) 20 tablet 0  . topiramate (TOPAMAX) 50 MG tablet Take 1 tablet (50 mg total) by mouth 2 (two) times daily. 60 tablet 0   No current facility-administered medications on file prior to visit.   Past Medical History  Diagnosis Date  . Borderline diabetic   . Weight gain   . Sleep deprivation     loss of sleep  . Fatigue   . Shortness of breath     with pain   . Nausea   . Blurred vision   . Wears glasses   . Arthritis   . Migraine headache   . History of measles, mumps, or rubella   . H/O varicella   . Hx of ovarian cyst   . Yeast infection   . H/O bacterial infection   . Back pain   . Depression   . 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 Anxiety   . Diabetes mellitus without complication       Review of Systems  Respiratory: Negative.   Cardiovascular: Negative.   Gastrointestinal: Negative.   Genitourinary: Negative.   Psychiatric/Behavioral: Negative for suicidal ideas, behavioral problems, sleep disturbance, self-injury and  agitation. The patient is nervous/anxious.   All other systems reviewed and are negative.  Filed Vitals:   02/26/14 1134  BP: 122/81  Pulse: 76  Temp: 98.6 F (37 C)  TempSrc: Oral  Height: 5\' 5"  (1.651 m)  Weight: 198 lb (89.812 kg)       Objective:   Physical Exam  Constitutional: She is oriented to person, place, and time. She appears well-developed. No distress.  Cardiovascular: Normal rate, regular rhythm, normal heart sounds and intact distal pulses.   No murmur heard. Pulmonary/Chest: Effort normal and breath sounds normal. No respiratory distress. She has no wheezes.  Abdominal: Soft. Bowel sounds are normal. She exhibits no distension and no mass. There is no tenderness.  Neurological: She is oriented to  person, place, and time.  No neurologic deficit,no signs of meningeal irritation.  Psychiatric: She has a normal mood and affect. Her behavior is normal. Judgment and thought content normal. She expresses no homicidal and no suicidal ideation.  Nursing note and vitals reviewed.      Assessment:     Chronic headache: Depression/Anxiety:    Plan:     Check problem list.

## 2014-02-26 NOTE — Patient Instructions (Signed)
It was nice seeing you today, glad you feel better with the depression especially now that you are on Wellbutrin. Please keep your appointment with the psychiatrist as well as the headache specialist. I will see you back in about 3 months, please come in sooner if you  Have any concern.

## 2014-03-03 ENCOUNTER — Ambulatory Visit (INDEPENDENT_AMBULATORY_CARE_PROVIDER_SITE_OTHER): Payer: 59 | Admitting: Psychiatry

## 2014-03-03 ENCOUNTER — Encounter (HOSPITAL_COMMUNITY): Payer: Self-pay | Admitting: Psychiatry

## 2014-03-03 VITALS — BP 125/75 | HR 70 | Ht 65.0 in | Wt 198.6 lb

## 2014-03-03 DIAGNOSIS — F331 Major depressive disorder, recurrent, moderate: Secondary | ICD-10-CM

## 2014-03-03 MED ORDER — BUPROPION HCL ER (XL) 150 MG PO TB24: 150.0000 mg | ORAL_TABLET | Freq: Every day | ORAL | Status: DC
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
## 2014-03-08 ENCOUNTER — Encounter (HOSPITAL_COMMUNITY): Payer: Self-pay | Admitting: Clinical

## 2014-03-08 ENCOUNTER — Ambulatory Visit (INDEPENDENT_AMBULATORY_CARE_PROVIDER_SITE_OTHER): Payer: 59 | Admitting: Clinical

## 2014-03-08 DIAGNOSIS — F331 Major depressive disorder, recurrent, moderate: Secondary | ICD-10-CM | POA: Diagnosis not present

## 2014-03-08 DIAGNOSIS — F411 Generalized anxiety disorder: Secondary | ICD-10-CM | POA: Diagnosis not present
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
## 2014-03-22 ENCOUNTER — Encounter: Payer: Self-pay | Admitting: Family Medicine

## 2014-03-22 ENCOUNTER — Ambulatory Visit (INDEPENDENT_AMBULATORY_CARE_PROVIDER_SITE_OTHER): Payer: 59 | Admitting: Family Medicine

## 2014-03-22 VITALS — BP 150/86 | HR 83 | Temp 98.4°F | Ht 65.0 in | Wt 194.4 lb

## 2014-03-22 DIAGNOSIS — J209 Acute bronchitis, unspecified: Secondary | ICD-10-CM

## 2014-03-22 MED ORDER — PROMETHAZINE-CODEINE 6.25-10 MG/5ML PO SYRP: 5.0000 mL | ORAL_SOLUTION | Freq: Every evening | ORAL | Status: DC | PRN

## 2014-03-22 MED ORDER — BENZONATATE 100 MG PO CAPS: 200.0000 mg | ORAL_CAPSULE | Freq: Three times a day (TID) | ORAL | Status: DC | PRN

## 2014-03-22 NOTE — Patient Instructions (Signed)

## 2014-03-22 NOTE — Progress Notes (Signed)
  Subjective:     [name] is a 54 y.o. female here for evaluation of a cough. Onset of symptoms was 4 days ago. Symptoms have been unchanged since that time. The cough is productive and is aggravated by nothing. Associated symptoms include: chills, fever and postnasal drip. Patient does not have a history of asthma. Patient does not have a history of environmental allergens. Patient has not traveled recently. Patient does not have a history of smoking. Patient has not had a previous chest x-ray. Patient has not had a PPD done.  Pt stating worsening productive cough over the last several days.  States this began about 4 days ago, had dysphagia at that time, then started to notice chills, night time fevers, coughing, upper respiratory tract congestion. Denies nausea, vomiting, abdominal pain.  Her husband did have similar Sx to her.  Did receive influenza shot at this flu season.   The following portions of the patient's history were reviewed and updated as appropriate: allergies, current medications, past family history, past medical history, past social history, past surgical history and problem list.  Review of Systems Pertinent items are noted in HPI.    Objective:   BP 150/86 mmHg  Pulse 83  Temp(Src) 98.4 F (36.9 C) (Oral)  Ht 5\' 5"  (1.651 m)  Wt 194 lb 6.4 oz (88.179 kg)  BMI 32.35 kg/m2 General appearance: alert, cooperative and appears stated age Head: Normocephalic, without obvious abnormality, atraumatic Eyes: conjunctivae/corneas clear. PERRL, EOM's intact. Fundi benign. Ears: normal TM's and external ear canals both ears Neck: no adenopathy Lungs: clear to auscultation bilaterally Heart: regular rate and rhythm, S1, S2 normal, no murmur, click, rub or gallop    Assessment:    Acute Bronchitis and possible influenza    Plan:    Explained lack of efficacy of antibiotics in viral disease. Antitussives per medication orders. Avoid exposure to tobacco smoke and  fumes. Call if shortness of breath worsens, blood in sputum, change in character of cough, development of fever or chills, inability to maintain nutrition and hydration. Avoid exposure to tobacco smoke and fumes. If pt does have influenza, she is greater than 48 hrs from onset of Sx and explained lack of efficacy of Tamiflu.  Explained natural outcourse of influenza vs viral bronchitis.  Will tx her cough with nighttime Phenergan w/ codeiene and daytime with tessalon pearls.  Continue with Mucinex ES BID, lots of fluids.  Very small concern for PNA today as no respiratory distress of wheezing noted on exam.  Pt does not looks acutely ill or in apparent distress.  One consideration would be small PE if continues to have cough and slight chest discomfort when this clears.  However, today her Well's score is 0 and does not describe pleurtic chest pain to myself

## 2014-03-26 ENCOUNTER — Ambulatory Visit (HOSPITAL_COMMUNITY): Payer: Self-pay | Admitting: Clinical

## 2014-04-06 ENCOUNTER — Telehealth (HOSPITAL_COMMUNITY): Payer: Self-pay | Admitting: *Deleted

## 2014-04-06 NOTE — Telephone Encounter (Signed)
Per Dr. Adele Schilder on 04-13-2014 he has a patient/teacher meeting in the afternoon, patient is scheduled for 4:15 she can be moved to 10:45 am time slot same day. Called patient, patient cannot do am appointment due to work, needs late afternoon or Friday's after 11 am. Patient scheduled for 04-16-2014 at 11:45 am. Patient stated okay with medication but if she runs out before appointment will call us. Patient verbalized understanding with change in appointment day and time.

## 2014-04-09 ENCOUNTER — Ambulatory Visit (HOSPITAL_COMMUNITY): Payer: Self-pay | Admitting: Clinical

## 2014-04-13 ENCOUNTER — Ambulatory Visit (HOSPITAL_COMMUNITY): Payer: Self-pay | Admitting: Psychiatry

## 2014-04-14 ENCOUNTER — Telehealth: Payer: Self-pay | Admitting: Obstetrics and Gynecology

## 2014-04-14 NOTE — Telephone Encounter (Signed)
Spoke with patient. Patient states that she has been experiencing increased hot flashes and night sweats for one month. "It is terrible. I think I may need an increase of the medication or something different all together." Patient is currently taking Estrace 1 mg 1 1/2 tablets daily. Advised patient will need to send message over to covering provider and return call with further recommendations. Patient is agreeable.

## 2014-04-14 NOTE — Telephone Encounter (Signed)
Patient wants to talk with the nurse she thinks she may need to have her medication changed or adjusted.

## 2014-04-15 MED ORDER — ESTRADIOL 1 MG PO TABS: 1.0000 mg | ORAL_TABLET | Freq: Two times a day (BID) | ORAL | Status: DC

## 2014-04-15 NOTE — Telephone Encounter (Signed)
Spoke with patient. Advised patient of message as seen below from [place]. Patient is agreeable and verbalizes understanding. Rx for Estrace 1mg  BID #[id number] sent to [place] on file. Patient is agreeable.  Routing to provider for final review. Patient agreeable to disposition. Will close encounter

## 2014-04-15 NOTE — Telephone Encounter (Signed)
OK to increase to 1mg  BID.  If no improvement over next two to three weeks, will need appt with Dr. Quincy Simmonds.

## 2014-04-16 ENCOUNTER — Ambulatory Visit (HOSPITAL_COMMUNITY): Payer: Self-pay | Admitting: Psychiatry

## 2014-04-21 ENCOUNTER — Telehealth: Payer: Self-pay | Admitting: Family Medicine

## 2014-04-22 ENCOUNTER — Ambulatory Visit (INDEPENDENT_AMBULATORY_CARE_PROVIDER_SITE_OTHER): Payer: 59 | Admitting: Family Medicine

## 2014-04-22 ENCOUNTER — Ambulatory Visit (HOSPITAL_COMMUNITY): Payer: Self-pay | Admitting: Psychiatry

## 2014-04-22 ENCOUNTER — Encounter: Payer: Self-pay | Admitting: Family Medicine

## 2014-04-22 VITALS — BP 141/91 | HR 99 | Temp 98.3°F | Ht 65.0 in | Wt 200.5 lb

## 2014-04-22 DIAGNOSIS — J029 Acute pharyngitis, unspecified: Secondary | ICD-10-CM

## 2014-04-22 LAB — POCT RAPID STREP A (OFFICE): Rapid Strep A Screen: NEGATIVE

## 2014-04-22 MED ORDER — PROMETHAZINE-CODEINE 6.25-10 MG/5ML PO SYRP: 5.0000 mL | ORAL_SOLUTION | Freq: Every evening | ORAL | Status: DC | PRN

## 2014-04-22 MED ORDER — AZITHROMYCIN 250 MG PO TABS: ORAL_TABLET | ORAL | Status: DC

## 2014-04-22 NOTE — Patient Instructions (Signed)
Thank you for coming in, today!  I think you probably have a virus causing your symptoms. You may have a bacterial infection on top of a virus. I want you to take azithromycin for 5 days. Follow the instructions on the prescription. You can take the same cough syrup for cough and nausea. It might make you very sleepy, so don't it and drive.  I will give you a work letter for yesterday and today. I will write you out through the weekend.  If things are worse instead of better, or if the antibiotic doesn't seem to help, or if you have new symptoms, call or come back to see [place]. If you have difficulty breathing, high fever that Tylenol doesn't bring down, or if you can't swallow, go to the emergency room.  Please feel free to call with any questions or concerns at any time, at [phone]. --Dr. Venetia Maxon

## 2014-04-22 NOTE — Progress Notes (Signed)
   Subjective:    Patient ID: [name], female    DOB: 1960-12-06, 54 y.o.   MRN: [id number]  HPI: Pt presents to clinic for SDA for sore throat, cough, congestion, and elevated temp (not frank fever) for 2-3 days. She was seen about a month ago and was treated with symptomatic medications for viral bronchitis; those medications helped and that illness resolved over several days. Her symptoms feel similar to last time. 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 She has been taking Alka-Seltzer Plus, which didn't help very much, and she has been taking some leftover Phenergan-codeine cough medication with some relief. Coughing does cause some muscle pulling in her back.  Review of Systems: As above. Occasional nausea but no vomiting.     Objective:   Physical Exam BP 141/91 mmHg  Pulse 99  Temp(Src) 98.3 F (36.8 C) (Oral)  Ht 5\' 5"  (1.651 m)  Wt 200 lb 8 oz (90.946 kg)  BMI 33.36 kg/m2 Gen: non-toxic-appearing adult female in NAD HEENT: /AT, EOMI, PERRLA, TM's clear bilaterally  MMM, posterior oropharynx mildly red with mild tonsillar swelling and thin, filmy exudate  Nasal mucosae red / inflamed without frank drainage Cardio: RRR, no murmur appreciated Pulm: CTAB, no wheezes, good bilateral air movement Ext: warm, well-perfused, no LE edema     Assessment & Plan:  54yo female with pharyngitis; likely viral illness with possible bacterial superinfection given exudate - Rx for azithromycin for 5 days - refilled Phenergan-codeine cough syrup - work note provided for yesterday, today, and tomorrow (return Monday 2/8) - recommended supportive care otherwise (push fluids, OTC antipyretics / pain medications PRN) - red flags reviewed that would prompt return to clinic or presentation to the ED  Emmaline Kluver, MD PGY-3, [facility] 04/22/2014, 2:30 PM

## 2014-04-30 ENCOUNTER — Telehealth: Payer: Self-pay | Admitting: Obstetrics and Gynecology

## 2014-04-30 NOTE — Telephone Encounter (Signed)
Spoke with patient. Patient states that she has been experiencing vaginal odor for one week. Denies discharge, itching, or irritation. Advised will need to be seen for evaluation with provider. Patient is agreeable. Appointment scheduled for 2/15 at 4pm with Regina Eck CNM. Patient is agreeable to date and time and to see NP.  Routing to provider for final review. Patient agreeable to disposition. Will close encounter

## 2014-04-30 NOTE — Telephone Encounter (Signed)
Left message to call Kaitlyn at [phone]. 

## 2014-04-30 NOTE — Telephone Encounter (Signed)
Patient thinks she may have a bacterial infection. Pharmacy on file is correct if needed.

## 2014-05-03 ENCOUNTER — Encounter: Payer: Self-pay | Admitting: Certified Nurse Midwife

## 2014-05-03 ENCOUNTER — Ambulatory Visit (INDEPENDENT_AMBULATORY_CARE_PROVIDER_SITE_OTHER): Payer: 59 | Admitting: Certified Nurse Midwife

## 2014-05-03 ENCOUNTER — Ambulatory Visit: Payer: 59 | Admitting: Certified Nurse Midwife

## 2014-05-03 VITALS — BP 110/72 | HR 68 | Resp 16 | Ht 65.25 in | Wt 202.0 lb

## 2014-05-03 DIAGNOSIS — B9689 Other specified bacterial agents as the cause of diseases classified elsewhere: Secondary | ICD-10-CM

## 2014-05-03 DIAGNOSIS — N951 Menopausal and female climacteric states: Secondary | ICD-10-CM

## 2014-05-03 DIAGNOSIS — N760 Acute vaginitis: Secondary | ICD-10-CM

## 2014-05-03 DIAGNOSIS — N393 Stress incontinence (female) (male): Secondary | ICD-10-CM

## 2014-05-03 DIAGNOSIS — A499 Bacterial infection, unspecified: Secondary | ICD-10-CM

## 2014-05-03 MED ORDER — METRONIDAZOLE 0.75 % VA GEL: 1.0000 | Freq: Every day | VAGINAL | Status: DC

## 2014-05-03 NOTE — Patient Instructions (Signed)
Bacterial Vaginosis Bacterial vaginosis is a vaginal infection that occurs when the normal balance of bacteria in the vagina is disrupted. It results from an overgrowth of certain bacteria. This is the most common vaginal infection in women of childbearing age. Treatment is important to prevent complications, especially in pregnant women, as it can cause a premature delivery. CAUSES  Bacterial vaginosis is caused by an increase in harmful bacteria that are normally present in smaller amounts in the vagina. Several different kinds of bacteria can cause bacterial vaginosis. However, the reason that the condition develops is not fully understood. RISK FACTORS Certain activities or behaviors can put you at an increased risk of developing bacterial vaginosis, including:  Having a new sex partner or multiple sex partners.  Douching.  Using an intrauterine device (IUD) for contraception. Women do not get bacterial vaginosis from toilet seats, bedding, swimming pools, or contact with objects around them. SIGNS AND SYMPTOMS  Some women with bacterial vaginosis have no signs or symptoms. Common symptoms include:  Grey vaginal discharge.  A fishlike odor with discharge, especially after sexual intercourse.  Itching or burning of the vagina and vulva.  Burning or pain with urination. DIAGNOSIS  Your health care provider will take a medical history and examine the vagina for signs of bacterial vaginosis. A sample of vaginal fluid may be taken. Your health care provider will look at this sample under a microscope to check for bacteria and abnormal cells. A vaginal pH test may also be done.  TREATMENT  Bacterial vaginosis may be treated with antibiotic medicines. These may be given in the form of a pill or a vaginal cream. A second round of antibiotics may be prescribed if the condition comes back after treatment.  HOME CARE INSTRUCTIONS   Only take over-the-counter or prescription medicines as  directed by your health care provider.  If antibiotic medicine was prescribed, take it as directed. Make sure you finish it even if you start to feel better.  Do not have sex until treatment is completed.  Tell all sexual partners that you have a vaginal infection. They should see their health care provider and be treated if they have problems, such as a mild rash or itching.  Practice safe sex by using condoms and only having one sex partner. SEEK MEDICAL CARE IF:   Your symptoms are not improving after 3 days of treatment.  You have increased discharge or pain.  You have a fever. MAKE SURE YOU:   Understand these instructions.  Will watch your condition.  Will get help right away if you are not doing well or get worse. FOR MORE INFORMATION  Centers for Disease Control and Prevention, Division of STD Prevention: www.cdc.gov/std American Sexual Health Association (ASHA): www.ashastd.org  Document Released: 03/05/2005 Document Revised: 12/24/2012 Document Reviewed: 10/15/2012 ExitCare Patient Information 2015 [employer]. This information is not intended to replace advice given to you by your health care provider. Make sure you discuss any questions you have with your health care provider.  

## 2014-05-03 NOTE — Progress Notes (Signed)
54 y.o. married african [place] female [id number] here with complaint of vaginal symptoms of dryness and odor. Patient does have occasional stress incontinence. Onset of symptoms 14 days ago. Denies new personal products. no STD concerns. Urinary symptoms stress incontinence occasional with holding urine to long. .   O:Healthy female WDWN Affect: normal, orientation x 3  Exam: Abdomen: Soft, non tender Lymph node: no enlargement or tenderness Pelvic exam: External genital: Normal, female BUS: negative Vagina: clear watery odorous discharge noted. Ph: 5.5,Wet prep taken, vagina slight atrophic changes, scant moisture Cervix: normal, non tender Uterus: normal, non tender Adnexa:normal, non tender, no masses or fullness noted   Wet Prep results:positive for clue cells   A:BV Vaginal dryness Stress incontinence    P:Discussed findings of BV and etiology. Discussed Aveeno or baking soda sitz bath for comfort. Avoid moist clothes or pads for extended period of time. Discussed vaginal dryness can encourage ph change which can lead to yeast or BV infection. Rx Metrogel see order  Discussed Coconut oil for use 3 times weekly for dryness and for sexual activity once she has completed the Metrogel treatment.  Discussed importance of not holding urine for long periods of time which encourages stress incontinence. Patient aware of importance of kegel exercise and will work on. Will advise if stress incontinence increases.   Rv prn

## 2014-05-06 ENCOUNTER — Other Ambulatory Visit: Payer: Self-pay | Admitting: Internal Medicine

## 2014-05-10 ENCOUNTER — Ambulatory Visit (HOSPITAL_COMMUNITY): Payer: Self-pay | Admitting: Psychiatry

## 2014-05-11 NOTE — Progress Notes (Signed)
Encounter reviewed by Dr. Brook Silva.  

## 2014-05-12 ENCOUNTER — Encounter (HOSPITAL_COMMUNITY): Payer: Self-pay | Admitting: Psychiatry

## 2014-05-12 ENCOUNTER — Ambulatory Visit (INDEPENDENT_AMBULATORY_CARE_PROVIDER_SITE_OTHER): Payer: 59 | Admitting: Psychiatry

## 2014-05-12 VITALS — BP 127/78 | HR 78 | Ht 65.0 in | Wt 203.0 lb

## 2014-05-12 DIAGNOSIS — F331 Major depressive disorder, recurrent, moderate: Secondary | ICD-10-CM

## 2014-05-12 MED ORDER — BUPROPION HCL ER (XL) 150 MG PO TB24: 150.0000 mg | ORAL_TABLET | Freq: Every day | ORAL | Status: DC
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
## 2014-05-24 NOTE — Telephone Encounter (Signed)
Patient states that she was seen my Mrs Jackelyn Poling but was told if her symptoms come back that she should call back in to schedule a followup. States that she would like to schedule with Dr Quincy Simmonds.  She thinks it may be her bladder as she has leakage.

## 2014-05-24 NOTE — Telephone Encounter (Signed)
Spoke with patient. Patient states that she was seen in February with Regina Eck CNM and treated for BV with metrogel. "I completed the medication and everything went away for like two weeks. The odor came back on Friday and it is terrible. I can not stand it. I am also leaking urine every time i cough, sneeze, or even clear my throat. My underwear are constantly wet and it is so irritating. I want to see Dr.Silva to get all of this worked out." Requesting appointment for Thursday 3/10. No current available appointments with Dr.Silva. Appointment scheduled for 3/9 at 1pm with Dr.Silva. Patient is agreeable to date and time. Will call early on 3/9 to see if there have been any cancellations for 3/10.  Routing to provider for final review. Patient agreeable to disposition. Will close encounter

## 2014-05-26 ENCOUNTER — Ambulatory Visit (INDEPENDENT_AMBULATORY_CARE_PROVIDER_SITE_OTHER): Payer: 59 | Admitting: Obstetrics and Gynecology

## 2014-05-26 ENCOUNTER — Encounter: Payer: Self-pay | Admitting: Obstetrics and Gynecology

## 2014-05-26 VITALS — BP 110/70 | HR 80 | Temp 98.6°F | Ht 65.0 in | Wt 205.0 lb

## 2014-05-26 DIAGNOSIS — N3946 Mixed incontinence: Secondary | ICD-10-CM

## 2014-05-26 DIAGNOSIS — N760 Acute vaginitis: Secondary | ICD-10-CM

## 2014-05-26 NOTE — Progress Notes (Signed)
GYNECOLOGY  VISIT   HPI: 54 y.o.   Married  [place] American  female   [phone] with No LMP recorded. Patient has had a hysterectomy.   here for   Urinary Incontinence and vaginal odor.   Seen on 05/04/14 for vaginitis and treated with Metrogel.  Improved for a few days only.   Leaking urine. "I want it fixed." Leaks for no reason.  Leaks with cough, laugh, sneeze. No pad use.  Gaining weight due to steroid injections for her back. DF - back and forth to the bathroom constantly. NF - once per night.  No enuresis.  Do dysuria or hematuria.  Does not always void well.   Declines medication for treatments in general. Forgets medication.   Coconut oil caused irritation vaginally.  Urine negative.  GYNECOLOGIC HISTORY: No LMP recorded. Patient has had a hysterectomy. Contraception:  Hysterectomy   Menopausal hormone therapy: Estrace 1 mg        OB History    Gravida Para Term Preterm AB TAB SAB Ectopic Multiple Living   3 2 2  1     2          Patient Active Problem List   Diagnosis Date Noted  . Health care maintenance 01/29/2014  . Memory loss 01/22/2014  . Depression with anxiety 01/22/2014  . VAIN I (vaginal intraepithelial neoplasia grade I)   . Hyperlipemia 10/22/2013  . Wears glasses   . Arthritis   . Migraine headache   . History of measles, mumps, or rubella   . H/O varicella   . Yeast infection   . H/O bacterial infection   . Back pain   . Borderline diabetic   . Weight gain   . Sleep deprivation   . Fatigue   . Shortness of breath   . Nausea   . Blurred vision   . Menopausal symptoms 10/05/2011  . Prediabetes 10/04/2008  . MIGRAINE HEADACHE 10/04/2008  . CHEST PAIN 10/04/2008    Past Medical History  Diagnosis Date  . Borderline diabetic   . Weight gain   . Sleep deprivation     loss of sleep  . Fatigue   . Shortness of breath     with pain   . Nausea   . Blurred vision   . Wears glasses   . Arthritis   . Migraine headache   .  History of measles, mumps, or rubella   . H/O varicella   . Hx of ovarian cyst   . Yeast infection   . H/O bacterial infection   . Back pain   . Depression   . 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 Anxiety   . Diabetes mellitus without complication     Past Surgical History  Procedure Laterality Date  . Abdominal hysterectomy    . Oophorectomy  2009  . Cholecystectomy  11/13/10  . Dilation and curettage of uterus    . Tubal ligation    . Dg gall bladder      Current Outpatient Prescriptions  Medication Sig Dispense Refill  . buPROPion (WELLBUTRIN XL) 150 MG 24 hr tablet Take 1 tablet (150 mg total) by mouth daily. 90 tablet 0  . estradiol (ESTRACE) 1 MG tablet Take 1 tablet (1 mg total) by mouth 2 (two) times daily. 60 tablet 8  . metFORMIN (GLUCOPHAGE) 500 MG tablet Take 1 tablet (500 mg total) by mouth 2 (two) times daily  with a meal. 60 tablet 3  . pravastatin (PRAVACHOL) 40 MG tablet TAKE 1 TABLET (40 MG TOTAL) BY MOUTH DAILY. 30 tablet 2   No current facility-administered medications for this visit.     ALLERGIES: Sulfa antibiotics  Family History  Problem Relation Age of Onset  . Hypertension Mother   . Hypotension Mother   . Anemia Mother     low iron  . Heart disease Sister   . Sickle cell trait Other   . Alcohol abuse Brother   . Alcohol abuse Brother   . Drug abuse Brother     History   Social History  . Marital Status: Married    Spouse Name: N/A  . Number of Children: N/A  . Years of Education: N/A   Occupational History  . Not on file.   Social History Main Topics  . Smoking status: Former Smoker -- 0.50 packs/day    Types: Cigarettes  . Smokeless tobacco: Never Used  . Alcohol Use: 0.0 oz/week    0 Standard drinks or equivalent per week  . Drug Use: No  . Sexual Activity: Yes    Birth Control/ Protection: Surgical     Comment: Hysterectomy   Other Topics Concern  . Not  on file   Social History Narrative   Patient lives at home with her husband [name]). She works for [employer] and she has her associates degree. Two children.    ROS:  Pertinent items are noted in HPI.  PHYSICAL EXAMINATION:    BP 110/70 mmHg  Pulse 80  Temp(Src) 98.6 F (37 C) (Oral)  Ht 5\' 5"  (1.651 m)  Wt 205 lb (92.987 kg)  BMI 34.11 kg/m2     General appearance: alert, cooperative and appears stated age    Pelvic: External genitalia:  no lesions              Urethra:  normal appearing urethra with no masses, tenderness or lesions              Bartholins and Skenes: normal                 Vagina: normal appearing vagina with normal color and discharge, no lesions              Cervix:  absent                   Bimanual Exam:  Uterus:  absent                                      Adnexa: no masses                                       ASSESSMENT  Mixed incontinence. Vaginitis.  Recent bacterial vaginosis.   PLAN  Comprehensive discussion of mixed incontinence and treatment options including medication, physical therapy, and midurethral sling/cystoscopy. I discussed risks and benefits of slings and discussed the permanent nature of the material used. She understands that a midurethral sling is only for treatment of stress incontinence and that the use of a sling can increase urgency.  [facility] handouts given to patient regarding the incontinence and surgical care thereof.  She will consider options.  If she would like to proceed with surgery, she will need urodynamic testing.  I have  already explained this to her as well.  If she has confirmation of BV, will do a course of Flagyl 500 mg po bid for one week.   25 minutes face to face time of which over 50% was spent in counseling.   An After Visit Summary was printed and given to the patient.

## 2014-05-27 ENCOUNTER — Other Ambulatory Visit: Payer: Self-pay | Admitting: Obstetrics and Gynecology

## 2014-05-27 LAB — WET PREP BY MOLECULAR PROBE
Candida species: NEGATIVE
Gardnerella vaginalis: POSITIVE — AB
Trichomonas vaginosis: NEGATIVE

## 2014-05-27 MED ORDER — METRONIDAZOLE 500 MG PO TABS: 500.0000 mg | ORAL_TABLET | Freq: Two times a day (BID) | ORAL | Status: DC

## 2014-05-28 ENCOUNTER — Telehealth: Payer: Self-pay

## 2014-05-28 NOTE — Telephone Encounter (Signed)
-----   Message from Nunzio Cobbs, MD sent at 05/27/2014  8:32 AM EST ----- Results to patient through My Chart.  I am sending Rx for Flagyl to her pharmacy now.

## 2014-05-28 NOTE — Telephone Encounter (Signed)
Called patient LMOVM to call me.(MP:NTIRWER of [facility])

## 2014-05-31 NOTE — Telephone Encounter (Signed)
Patient reviewed results through MyChart. 

## 2014-06-04 ENCOUNTER — Ambulatory Visit: Payer: Self-pay | Admitting: Family Medicine

## 2014-06-07 ENCOUNTER — Ambulatory Visit (INDEPENDENT_AMBULATORY_CARE_PROVIDER_SITE_OTHER): Payer: 59 | Admitting: Internal Medicine

## 2014-06-07 ENCOUNTER — Encounter: Payer: Self-pay | Admitting: Internal Medicine

## 2014-06-07 VITALS — BP 100/74 | HR 77 | Temp 98.5°F | Ht 65.0 in | Wt 203.6 lb

## 2014-06-07 DIAGNOSIS — R7309 Other abnormal glucose: Secondary | ICD-10-CM

## 2014-06-07 DIAGNOSIS — R7303 Prediabetes: Secondary | ICD-10-CM

## 2014-06-07 NOTE — Progress Notes (Signed)
Patient ID: [name], female   DOB: [birth year], 54 y.o.   MRN: [id number]  HPI: [name] is a 54 y.o.-year-old female, initially referred by her PCP, Dr. Judeth Horn, for management of prediabetes, dx early 2000s, controlled, without complications and hyperlipidemia. Last visit 4 mo ago.  Last hemoglobin A1c was: Lab Results  Component Value Date   HGBA1C 6.3 01/22/2014   HGBA1C 6.4* 10/14/2013   HGBA1C 6.1* 10/05/2011  She had several steroid inj since last visit - last in 04/2014.   She is on: Metformin 500 mg 2x a day. She initially had diarrhea with it, now resolved.  Pt checks sugars 0-1x a day: - am: 88-109 >> 85-100, 115 x1 - lunch: 95-101 - 2h after lunch: 130-135 - 2h after dinner: 165-175 (when she has a larger dinner or dessert) No lows. Lowest sugar was 97 >> 80s; ? she has hypoglycemia awareness  She has a OneTouch Ultra.  Pt's meals are better, after she saw nutrition and [facility]. She also tries to walk.  - no CKD, last BUN/creatinine:  Lab Results  Component Value Date   BUN 12 01/22/2014   CREATININE 0.82 01/22/2014   - last eye exam was in 03-06/2013. No DR. She has blurry vision. - no numbness and tingling in feet.  HL: Reviewed last 3 sets of lipids - much improved at last check, after we started Pravastatin 40 mg.  Lab Results  Component Value Date   CHOL 197 02/05/2014   CHOL 234* 10/14/2013   CHOL 240* 10/05/2011   Lab Results  Component Value Date   HDL 45 02/05/2014   HDL 51 10/14/2013   HDL 49 10/05/2011   Lab Results  Component Value Date   LDLCALC 117* 02/05/2014   LDLCALC 156* 10/14/2013   LDLCALC 163* 10/05/2011   Lab Results  Component Value Date   TRIG 177* 02/05/2014   TRIG 136 10/14/2013   TRIG 142 10/05/2011   Lab Results  Component Value Date   CHOLHDL 4.4 02/05/2014   CHOLHDL 4.6 10/14/2013   CHOLHDL 4.9 10/05/2011   Her last LFTs are normal: Lab Results  Component Value Date   ALT 8 02/05/2014   AST  13 02/05/2014   ALKPHOS 83 02/05/2014   BILITOT <0.2 02/05/2014   Has FH of HL in sister.  She was eating mostly fried foods, this improved after she saw nutrition. She also took her husband to some of the classes, so they can follow the diet together.  ROS: Constitutional:no weight gain or loss, + fatigue, no subjective hyperthermia, + poor sleep Eyes: No blurry vision, no xerophthalmia ENT: no sore throat, no nodules palpated in throat, no dysphagia/odynophagia, no hoarseness Cardiovascular: no CP/SOB/palpitations/leg swelling Respiratory: no cough/SOB Gastrointestinal: + N/+ V/no D/C Musculoskeletal: no muscle/joint aches Skin: no rashes Neurological: no tremors/numbness/tingling/dizziness, + HA (migraines) + Low libido  I reviewed pt's medications, allergies, PMH, social hx, family hx, and changes were documented in the history of present illness. Otherwise, unchanged from my initial visit note.  PE: BP 100/74 mmHg  Pulse 77  Temp(Src) 98.5 F (36.9 C) (Oral)  Ht 5\' 5"  (1.651 m)  Wt 203 lb 9.6 oz (92.352 kg)  BMI 33.88 kg/m2 Wt Readings from Last 3 Encounters:  06/07/14 203 lb 9.6 oz (92.352 kg)  05/26/14 205 lb (92.987 kg)  05/12/14 203 lb (92.08 kg)   Constitutional: overweight, in NAD Eyes: PERRLA, EOMI, no exophthalmos ENT: moist mucous membranes, no thyromegaly, no cervical lymphadenopathy Cardiovascular: RRR,  No MRG Respiratory: CTA B Gastrointestinal: abdomen soft, NT, ND, BS+ Musculoskeletal: no deformities, strength intact in all 4 Skin: moist, warm, no rashes Neurological: no tremor with outstretched hands, DTR normal in all 4  ASSESSMENT: 1. Prediabetes  2. HL  PLAN:  1. Patient has a history of prediabetes, for which we started metformin 500 mg 2x a day. Her hemoglobin A1c decreased. We reviewed her CBGs together >> all at goal. - I suggested to continue Metformin 500 mg 2x a day - I again advised her to start checking sugars at different times of  the day - check once a day or every other day, rotating checks - given more sugar logs  - advised for yearly eye exams - she is up-to-date; needs one soon - check HbA1c today - She improved her diet after seeing nutrition, encouraged her to continue - Return to clinic in 4 mo with sugar log  2. HL - Pt with high LDL, also prediabetic, obese - Lipids improved with Pravachol 40 mg at night >> she tolerates the medication well >> will continue this dose - will check LFTs and Lipids at next visit  Office Visit on 06/07/2014  Component Date Value Ref Range Status  . Hgb A1c MFr Bld 06/07/2014 6.6* 4.6 - 6.5 % Final   Glycemic Control Guidelines for People with Diabetes:Non Diabetic:  <6%Goal of Therapy: <7%Additional Action Suggested:  >8%   HbA1c higher, in the DM range. Will continue current regimen. Continue to work on her diet. Will recheck at next visit. If still high at that time, I would suggest to increase metformin to target dose.

## 2014-06-07 NOTE — Patient Instructions (Signed)
Please continue Metformin 500 mg 2x a day.  Continue Pravachol 40 mg at night.   Please stop at the lab.

## 2014-06-08 LAB — HEMOGLOBIN A1C: Hgb A1c MFr Bld: 6.6 % — ABNORMAL HIGH (ref 4.6–6.5)

## 2014-06-18 ENCOUNTER — Ambulatory Visit (INDEPENDENT_AMBULATORY_CARE_PROVIDER_SITE_OTHER): Payer: 59 | Admitting: Family Medicine

## 2014-06-18 ENCOUNTER — Encounter: Payer: Self-pay | Admitting: Family Medicine

## 2014-06-18 VITALS — BP 114/79 | HR 78 | Temp 98.4°F | Ht 65.0 in | Wt 201.4 lb

## 2014-06-18 DIAGNOSIS — N393 Stress incontinence (female) (male): Secondary | ICD-10-CM

## 2014-06-18 DIAGNOSIS — M545 Low back pain: Secondary | ICD-10-CM | POA: Diagnosis not present

## 2014-06-18 DIAGNOSIS — M79672 Pain in left foot: Secondary | ICD-10-CM

## 2014-06-18 NOTE — Assessment & Plan Note (Signed)
Acute on chronic back pain. No neurologic deficit. Continue home pain medicine. Home back exercise recommended. F/U soon if no improvement.

## 2014-06-18 NOTE — Assessment & Plan Note (Signed)
Likely ankle sprain. I recommended use of her home Oxy as needed for pain. Ankle brace recommended. I gave home ankle and foot exercise instruction. PT & Xray If no improvement.

## 2014-06-18 NOTE — Progress Notes (Signed)
Subjective:     Patient ID: [name], female   DOB: 11-05-1960, 54 y.o.   MRN: [id number]  HPI Urine Incontinence: C/O losing urine in her under-pant whenever she cough, sneezes or laugh.This has been ongoing for more than 2 months, this gives her a bad odor in her under wear, she denies vaginal discharge, no itching, no dysuria. She is currently trying Kegel exercise but she is yet to notice any improvement. Back pain:C/O low back pain worsening since yesterday, she normally have back pain but since she exercised yesterday walking a 1.5 mile distance the pain had been constant. She uses Oxycodone at home as needed for pain, no associated limb weakness or numbness. 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 Pain is about 7/10 in severity. No swelling or redness.  Review of Systems  Respiratory: Negative.   Cardiovascular: Negative.   Gastrointestinal: Negative.   Genitourinary: Negative for dysuria, difficulty urinating and dyspareunia.       Urine incontinence  Musculoskeletal: Positive for back pain.  All other systems reviewed and are negative.  Filed Vitals:   06/18/14 0837  BP: 114/79  Pulse: 78  Temp: 98.4 F (36.9 C)  TempSrc: Oral  Height: 5\' 5"  (1.651 m)  Weight: 201 lb 6 oz (91.343 kg)       Objective:   Physical Exam  Constitutional: She appears well-developed. No distress.  Cardiovascular: Normal rate, regular rhythm and normal heart sounds.   No murmur heard. Pulmonary/Chest: Effort normal and breath sounds normal. No respiratory distress. She has no wheezes.  Abdominal: Soft. Bowel sounds are normal. She exhibits no distension and no mass. There is no tenderness.  Musculoskeletal: She exhibits no edema.       Right ankle: She exhibits no swelling.       Left ankle:  She exhibits decreased range of motion. She exhibits no swelling. Tenderness.       Lumbar back: She exhibits decreased range of motion and tenderness. She exhibits no deformity.  Nursing note and vitals reviewed.      Assessment:     Urine incontinence Low back pain Left foot pain     Plan:     Check problem list.

## 2014-06-18 NOTE — Patient Instructions (Signed)

## 2014-06-18 NOTE — Assessment & Plan Note (Signed)
She already started Kegel exercise. Other treatment option discussed which include pharmacologic and surgery. She prefer to continue Kegel for now. F/U in 4-8 wks for reassessment or sooner if worsening. She agreed with plan.

## 2014-06-25 ENCOUNTER — Other Ambulatory Visit: Payer: Self-pay | Admitting: Physician Assistant

## 2014-07-02 ENCOUNTER — Ambulatory Visit: Payer: Self-pay | Admitting: Pharmacist

## 2014-07-02 ENCOUNTER — Ambulatory Visit (INDEPENDENT_AMBULATORY_CARE_PROVIDER_SITE_OTHER): Payer: Self-pay | Admitting: Family Medicine

## 2014-07-02 ENCOUNTER — Encounter: Payer: Self-pay | Admitting: Pharmacist

## 2014-07-02 DIAGNOSIS — R7303 Prediabetes: Secondary | ICD-10-CM

## 2014-07-02 DIAGNOSIS — R7309 Other abnormal glucose: Secondary | ICD-10-CM

## 2014-07-02 NOTE — Progress Notes (Signed)
Subjective:  Patient presents today for an annual pharmacy visit and 3 month diabetes follow-up as part of the employer-sponsored [employer] program.  Current diabetes regimen includes metformin 500 mg BID.  Patient also continues on daily statin.  Most recent MD follow-up was in March with Dr. Cruzita Lederer. A1C at that visit was 6.6%.  Patient has a pending appt with Dr. Cruzita Lederer in July. No major health changes at this time.   Medication changes- she is now on Meloxicam 7.5 mg daily and Ciclopirox 1% shampoo daily.    She reports that she is tired all the time and is having a hard time finding energy to do things.   Assessment/Plan:  Patient is a 54 yo female with DM2. Most recent A1C was   6.6 % which is at goal of less than 7%. Weight is decreased from last visit with me. She has recently started weight watchers and reports that she has lost 4 pounds.   CBG Review: She is checking 3-4 times a week, fasting. Most readings are around 100 with a few excursions into 110-130s. No hypoglycemia.    Lifestyle improvements:  Physical Activity-  She has recently started walking. She has chronic back pain which somewhat limits physical activity. She wants to continue with this even though it causes her pain.   Nutrition-  Patient recently started weight watchers and had her first meeting this week. She is tracking some of her food but has not been able to track an entire days worth of food yet. She admits that she is not sure she will be able to track food.   I spent the majority of the visit discussing nutrition with her. I encouraged her to track her food and discussed how to find nutrition information and points information for the food she eats. I gave her snack suggestions and discussed how to incorporate fruits into her weight watchers diet.    [place] Plan        Office Visit from 07/02/2014 in [place] Problem One  Physical Activity   Care Plan  for Problem One  Active   [facility] Long Term Goal (31-90 days)  Start walking three days a week for 30-45 minutes   [facility] Long Term Goal Start Date  07/02/14   Care Plan Problem Two  Weight Loss   Care Plan for Problem Two  Active   [facility] Long Term Goal (31-90) days  Attend Weight watchers meetings and track daily food intake   [facility] Long Term Goal Start Date  07/02/14      Goals for Next Visit:  1. Salad Dressings to try- [employer]- find it in the produce section in the fridge.  2. Weight Watchers- attend the meeting once a week. Track your food. Either track on your tablet or upgrade your phone so you can track on your phone.  3. Physical Activity- aim for 3 days a week for 30-45 minutes.    Next appointment to see Lenna Sciara is July 12th at  3:45 PM.    Truett Mainland. Donneta Romberg, PharmD, BCPS, CDE [facility] [place] Coordinator [phone]

## 2014-07-09 NOTE — Progress Notes (Signed)
ATTENDING PHYSICIAN NOTE: I have reviewed the chart and agree with the plan as detailed above. Peyten Punches MD Pager [phone]  

## 2014-08-06 ENCOUNTER — Other Ambulatory Visit (HOSPITAL_COMMUNITY): Payer: Self-pay | Admitting: Psychiatry

## 2014-08-06 ENCOUNTER — Telehealth (HOSPITAL_COMMUNITY): Payer: Self-pay

## 2014-08-06 ENCOUNTER — Encounter: Payer: Self-pay | Admitting: Family Medicine

## 2014-08-06 NOTE — Progress Notes (Signed)
Please inform patient form has been placed in [name]'s inbox, ready to be faxed and she can pick up copy from [place].

## 2014-08-06 NOTE — Progress Notes (Signed)
Paperwork placed in PCP box for completion. Evelen Vazguez, CMA.

## 2014-08-06 NOTE — Progress Notes (Signed)
Mrs. Weyman is dropping off FMLA forms to be completed, the primary reason stated would be due to her migraine headaches, as this is just a renewal. Once the form is completed she would like for [place] to fax it to [employer] at [phone] and then give her a call at [phone], so that she may pick up her copy. Thank you, Fonda Kinder, ASA

## 2014-08-06 NOTE — Progress Notes (Signed)
Pt informed that FMLA paperwork is complete and ready for pick up.  Forms faxed to [employer] and copied for scanning in patient's record.  Derl Barrow, RN

## 2014-08-10 ENCOUNTER — Ambulatory Visit (HOSPITAL_COMMUNITY): Payer: Self-pay | Admitting: Psychiatry

## 2014-08-13 ENCOUNTER — Telehealth: Payer: Self-pay | Admitting: Family Medicine

## 2014-08-13 ENCOUNTER — Ambulatory Visit (INDEPENDENT_AMBULATORY_CARE_PROVIDER_SITE_OTHER): Payer: 59 | Admitting: Family Medicine

## 2014-08-13 ENCOUNTER — Encounter: Payer: Self-pay | Admitting: Family Medicine

## 2014-08-13 VITALS — BP 112/69 | HR 84 | Temp 98.4°F | Wt 196.0 lb

## 2014-08-13 DIAGNOSIS — H9201 Otalgia, right ear: Secondary | ICD-10-CM | POA: Insufficient documentation

## 2014-08-13 DIAGNOSIS — J029 Acute pharyngitis, unspecified: Secondary | ICD-10-CM | POA: Diagnosis not present

## 2014-08-13 HISTORY — DX: Otalgia, right ear: H92.01

## 2014-08-13 LAB — POCT RAPID STREP A (OFFICE): Rapid Strep A Screen: NEGATIVE

## 2014-08-13 MED ORDER — NEOMYCIN-POLYMYXIN-HC 3.5-10000-1 OT SOLN: 4.0000 [drp] | Freq: Three times a day (TID) | OTIC | Status: DC

## 2014-08-13 MED ORDER — CIPROFLOXACIN-DEXAMETHASONE 0.3-0.1 % OT SUSP: 4.0000 [drp] | Freq: Two times a day (BID) | OTIC | Status: DC

## 2014-08-13 MED ORDER — IBUPROFEN 400 MG PO TABS: 400.0000 mg | ORAL_TABLET | Freq: Four times a day (QID) | ORAL | Status: DC | PRN

## 2014-08-13 NOTE — Assessment & Plan Note (Signed)
Likely viral vs otitis externa. Ciprodex prescribed. Motrin prescribed prn pain.

## 2014-08-13 NOTE — Telephone Encounter (Signed)
Pharmacy called about need to change Ciprodex order due to expense. This was switched to cortisporin via verbal order.

## 2014-08-13 NOTE — Progress Notes (Signed)
Subjective:     Patient ID: [name], female   DOB: May 27, 1960, 54 y.o.   MRN: [id number]  Sore Throat  This is a new problem. The current episode started in the past 7 days (ST started 3 days ago.). The problem has been gradually worsening. The pain is worse on the right side. The maximum temperature recorded prior to her arrival was 102 - 102.9 F. The pain is at a severity of 10/10. Associated symptoms include congestion, ear pain, headaches and trouble swallowing. Pertinent negatives include no coughing, ear discharge or plugged ear sensation. Associated symptoms comments: Left ear irritation, associated chills in addition to her fever.. She has had no exposure to strep. Exposure to: No know exposure. She has tried NSAIDs for the symptoms. The treatment provided mild relief.  Otalgia  There is pain in the right ear. This is a new problem. The current episode started in the past 7 days. The problem has been gradually worsening. The maximum temperature recorded prior to her arrival was 102 - 102.9 F. The pain is at a severity of 5/10. Associated symptoms include headaches. Pertinent negatives include no coughing or ear discharge.     Review of Systems  HENT: Positive for congestion, ear pain and trouble swallowing. Negative for ear discharge.   Respiratory: Negative for cough.   Neurological: Positive for headaches.  All other systems reviewed and are negative.      Objective:   Physical Exam  Constitutional: She appears well-developed. No distress.  HENT:  Head: Normocephalic.  Right Ear: There is tenderness. No drainage or swelling. Tympanic membrane is injected.  Left Ear: Tympanic membrane and ear canal normal. No drainage, swelling or tenderness. Tympanic membrane is not injected. No hemotympanum.  Mouth/Throat: Mucous membranes are normal. Posterior oropharyngeal erythema present. No oropharyngeal exudate, posterior oropharyngeal edema or tonsillar abscesses.  Cardiovascular:  Normal rate, regular rhythm, normal heart sounds and intact distal pulses.   No murmur heard. Pulmonary/Chest: Effort normal and breath sounds normal. No respiratory distress. She has no wheezes.  Abdominal: Soft. She exhibits no distension. There is no tenderness.  Nursing note and vitals reviewed.      Assessment:     Sore throat Otalgia     Plan:     Check problem list.

## 2014-08-13 NOTE — Assessment & Plan Note (Signed)
Strep test negative. Likely viral or referred ear pain. Motrin prescribed prn pain. Warm saline gurgle recommended. F/U prn.

## 2014-08-13 NOTE — Patient Instructions (Signed)
It was nice seeing you, your strep test was negative. Your sore throat is likely viral. I will hope that it will resolve in the next few days. I will give you ear drops for your right ear inflammation.  Pharyngitis Pharyngitis is a sore throat (pharynx). There is redness, pain, and swelling of your throat. HOME CARE   Drink enough fluids to keep your pee (urine) clear or pale yellow.  Only take medicine as told by your doctor.  You may get sick again if you do not take medicine as told. Finish your medicines, even if you start to feel better.  Do not take aspirin.  Rest.  Rinse your mouth (gargle) with salt water ( tsp of salt per 1 qt of water) every 1-2 hours. This will help the pain.  If you are not at risk for choking, you can suck on hard candy or sore throat lozenges. GET HELP IF:  You have large, tender lumps on your neck.  You have a rash.  You cough up green, yellow-brown, or bloody spit. GET HELP RIGHT AWAY IF:   You have a stiff neck.  You drool or cannot swallow liquids.  You throw up (vomit) or are not able to keep medicine or liquids down.  You have very bad pain that does not go away with medicine.  You have problems breathing (not from a stuffy nose). MAKE SURE YOU:   Understand these instructions.  Will watch your condition.  Will get help right away if you are not doing well or get worse. Document Released: 08/22/2007 Document Revised: 12/24/2012 Document Reviewed: 11/10/2012 [facility] Patient Information 2015 [place], [place]. This information is not intended to replace advice given to you by your health care provider. Make sure you discuss any questions you have with your health care provider.

## 2014-08-17 ENCOUNTER — Ambulatory Visit (INDEPENDENT_AMBULATORY_CARE_PROVIDER_SITE_OTHER): Payer: 59 | Admitting: Family Medicine

## 2014-08-17 ENCOUNTER — Encounter: Payer: Self-pay | Admitting: Family Medicine

## 2014-08-17 VITALS — BP 112/75 | HR 76 | Temp 98.3°F | Ht 65.0 in | Wt 198.1 lb

## 2014-08-17 DIAGNOSIS — J988 Other specified respiratory disorders: Secondary | ICD-10-CM

## 2014-08-17 DIAGNOSIS — J029 Acute pharyngitis, unspecified: Secondary | ICD-10-CM

## 2014-08-17 DIAGNOSIS — J22 Unspecified acute lower respiratory infection: Secondary | ICD-10-CM

## 2014-08-17 MED ORDER — AMOXICILLIN-POT CLAVULANATE 250-62.5 MG/5ML PO SUSR: 875.0000 mg | Freq: Two times a day (BID) | ORAL | Status: DC

## 2014-08-17 NOTE — Progress Notes (Signed)
   Subjective:    Patient ID: [name], female    DOB: [birth year], 54 y.o.   MRN: [id number]  HPI: Pt presents to clinic for SDA with several complaints similar to visit last week on 5/27 a strep test at that time was negative. She complains of continued right ear pain / pressure (despite Cortisporin ear drop use), with persistent sore throat and cough. Her cough is worsening and now causing right-sided chest wall and back muscle spasm-type pain. Her cough is not productive "all the time" but she complains of significant congestion and some occasional phlegm production. She is not frankly short of breath but the cough is bad enough at times to make her "gasp a little" when the cough ends. She reports fever to 100.3 last night. 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  Review of Systems: As above. Denies frank N/V, abdominal pain, change in bowel / bladder habits.     Objective:   Physical Exam BP 112/75 mmHg  Pulse 76  Temp(Src) 98.3 F (36.8 C) (Oral)  Ht 5\' 5"  (1.651 m)  Wt 198 lb 2 oz (89.869 kg)  BMI 32.97 kg/m2 Gen: adult female in NAD but uncomfortable, especially with cough HEENT: [place]/AT, EOMI, PERRLA, MMM, TM's clear bilaterally; bilateral ear canals clear without frank maceration, redness, excoriation, or drainage  Nasal mucosae and posterior oropharynx mildly edematous / erythematous  No frank tonsillar exudates noted Neck: supple, normal ROM, moderate tender lymphadenopathy (R > L) noted in anterior cervical chains Cardio: RRR, no murmur appreciated Pulm: generally clear to auscultation with deep breathing triggering significant cough during exam  Right lung fields with diminished breath sounds compared to left, especially at right base Abd: soft, nontender, BS+ Ext: warm, well-perfused, no LE edema     Assessment & Plan:  54yo female with likely persistent pharyngitis (?viral) now with possible superimposed lower  respiratory infection - Rx for Augmentin empirically to cover for both pharyngitis and possible early PNA - defer CXR at this point as would favor abx therapy for persistent pharyngitis and presence or lack of PNA on xray would not change therapy - advised supportive care, otherwise - work note provided to return to work later this week or early next week based on symptoms - strongly advised close f/u with PCP Dr. Gwendlyn Deutscher or presentation to the ED if symptoms are frankly worsening or progressing - suggested f/u in 2-3 weeks with PCP for re-eval, regardless  Emmaline Kluver, MD PGY-3, [facility] 08/17/2014, 8:30 PM

## 2014-08-17 NOTE — Patient Instructions (Signed)
Thank you for coming in, today!  I think you have an infection in your throat and you may have an early infection in your lower airways (this may be an early pneumonia). I want to treat you with Augmentin (amoxicillin-clavulanate), an antibiotic that should cover both types of infection. You should take this medicine twice per day for 10 days. I prescribed the liquid form to make it easier to swallow.  Make an appointment today to come back to see Dr. Gwendlyn Deutscher in about 3 weeks. If you are completely better by then, you can cancel that appointment. If you have worse or progressive symptoms in the meantime, come back sooner or go to the emergency room.  Please feel free to call with any questions or concerns at any time, at [phone]. --Dr. Venetia Maxon

## 2014-08-23 ENCOUNTER — Ambulatory Visit (INDEPENDENT_AMBULATORY_CARE_PROVIDER_SITE_OTHER): Payer: 59 | Admitting: Family Medicine

## 2014-08-23 ENCOUNTER — Telehealth: Payer: Self-pay | Admitting: Family Medicine

## 2014-08-23 ENCOUNTER — Ambulatory Visit (HOSPITAL_COMMUNITY)
Admission: RE | Admit: 2014-08-23 | Discharge: 2014-08-23 | Disposition: A | Discharge status: Home / Self Care | Payer: 59 | Source: Ambulatory Visit | Attending: Family Medicine | Admitting: Family Medicine

## 2014-08-23 ENCOUNTER — Encounter: Payer: Self-pay | Admitting: Family Medicine

## 2014-08-23 VITALS — BP 116/74 | HR 73 | Temp 98.5°F | Ht 65.0 in | Wt 196.4 lb

## 2014-08-23 DIAGNOSIS — R05 Cough: Secondary | ICD-10-CM | POA: Insufficient documentation

## 2014-08-23 DIAGNOSIS — J029 Acute pharyngitis, unspecified: Secondary | ICD-10-CM

## 2014-08-23 DIAGNOSIS — H9201 Otalgia, right ear: Secondary | ICD-10-CM

## 2014-08-23 DIAGNOSIS — R059 Cough, unspecified: Secondary | ICD-10-CM

## 2014-08-23 MED ORDER — FLUTICASONE PROPIONATE 50 MCG/ACT NA SUSP: 2.0000 | Freq: Every day | NASAL | Status: DC

## 2014-08-23 MED ORDER — BENZONATATE 100 MG PO CAPS: 100.0000 mg | ORAL_CAPSULE | Freq: Two times a day (BID) | ORAL | Status: DC | PRN

## 2014-08-23 NOTE — Telephone Encounter (Signed)
Call to notify patient of normal chest x-ray. She voiced understanding.  Hilton Sinclair, MD

## 2014-08-23 NOTE — Progress Notes (Signed)
Patient ID: [name], female   DOB: 1960-06-14, 54 y.o.   MRN: [id number] Subjective:   CC: Cold  HPI:   She presents for evaluation for cold. She was seen 08/13/2014 and 08/17/2014 with similar symptoms, thought to be viral or referred ear pain with a negative strep test. She was treated conservatively at this time along with Cortisporin for otitis externa. At 08/17/2014 visit, she was prescribed Augmentin to cover for her pharyngitis and possible early pneumonia. Symptoms of sore throat and otalgia have now resolved but cough has been present for 13 days. Since then, she reports some bilateral chest and back pain when she coughs that is sharp. She is coughing less overall, but is still producing thick yellowish mucus. She occasionally has hot and cold spells. She has had to sit up a few times at night feeling like she is gasping, but this only occurs a couple of times. She denies any chest pain or history of cardiac issues. She denies any sneezing but reports some rhinorrhea and eye crusting in the morning. She is still taking her course of Augmentin and has 3 more days. She denies any sick contacts including anyone with tuberculosis. She also denies any weight changes or leg swelling. She is no longer having any blood tinges in her sputum. She is eating and drinking normally but her urine has been a little bit dark. Her most frustrating symptoms are the persistent cough and back pain with coughing. Robitussin did not help but NyQuil has helped. She denies any history of known tick bites.   Review of Systems - Per HPI.   PMH - incontinence, vaginal intraepithelial neoplasia grade 1, sleep deprivation, prediabetes, migraines, history of memory loss, hyperlipidemia, history of measles, mumps, or rubella, depression and anxiety, arthritis Receives flu shot this past year Smoking status: Nonsmoker     Objective:  Physical Exam BP 116/74 mmHg  Pulse 73  Temp(Src) 98.5 F (36.9 C) (Oral)   Ht 5\' 5"  (1.651 m)  Wt 196 lb 6 oz (89.075 kg)  BMI 32.68 kg/m2  SpO2 97% GEN: NAD Cardiovascular: Regular rate and rhythm, no murmurs rubs or gallops, distant Pulmonary: Clear to auscultation bilaterally, normal effort and no wheezes or crackles Abdomen: Soft, nontender, nondistended HEENT: Atraumatic, normocephalic, sclera clear, extra ocular movements intact, TMs clear with mild retraction bilaterally, no erythema or purulence. Neck supple, no lymphadenopathy, nares patent and mildly boggy, oropharynx clear with moist mucous membranes and no purulence or erythema, no sinus tenderness Skin: No rash or cyanosis Extremities: No lower extremity edema or calf tenderness     Assessment:     [name] is a 54 y.o. female here for persistent cold symptoms.    Plan:     # See problem list and after visit summary for problem-specific plans.   # Health Maintenance: Return every October for flu shot.  Follow-up: Follow up in 1 week if symptoms not improving or immediately with any difficulty breathing or severe worsening of symptoms.   Hilton Sinclair, MD [place]

## 2014-08-23 NOTE — Assessment & Plan Note (Signed)
Resolved. - Monitor; flonase rx'ed

## 2014-08-23 NOTE — Assessment & Plan Note (Signed)
Prior pharyngitis and possible pneumonia treated with Augmentin, seems to be improving but persistent cough and subjective fevers and chills. Most likely still resolving illness as overall symptoms have improved. Well-appearing with normal vital signs. No signs of fluid overload or new heart failure. -Chest x-ray to evaluate for persistent pneumonia -Discussed that post infectious cough may last up to 6 weeks, trial Tessalon Perles and Flonase for postnasal drip. -NyQuil when necessary, remain hydrated with warm fluids/honey, ibuprofen PRN muscle aches from coughing -Follow-up in 1 week with PCP if symptoms are not significantly improved or immediately if any difficulty breathing or other major concerns

## 2014-08-23 NOTE — Patient Instructions (Signed)
It was good to see you today. Get a chest x-ray at [facility]. I will call you with the results. I think you are getting better, though. Continue your antibiotic course. Take Tessalon Perles as needed for cough. Also be sure to hydrate with plenty of warm fluids and honey. Use Flonase every day to help with postnasal drip and you can continue this if you have seasonal allergic symptoms as it will also help with that. Continue using NyQuil as needed in the evenings. You  can use ibuprofen as needed for muscle aches from coughing. Follow-up in one week with Dr. Gwendlyn Deutscher or sooner as needed if you develop difficulty breathing or any other major concerns.  Best,  Hilton Sinclair, MD

## 2014-08-26 ENCOUNTER — Ambulatory Visit (INDEPENDENT_AMBULATORY_CARE_PROVIDER_SITE_OTHER): Payer: 59 | Admitting: Certified Nurse Midwife

## 2014-08-26 ENCOUNTER — Encounter: Payer: Self-pay | Admitting: Certified Nurse Midwife

## 2014-08-26 VITALS — BP 104/68 | HR 70 | Resp 20 | Ht 65.25 in | Wt 202.0 lb

## 2014-08-26 DIAGNOSIS — B3731 Acute candidiasis of vulva and vagina: Secondary | ICD-10-CM

## 2014-08-26 DIAGNOSIS — B373 Candidiasis of vulva and vagina: Secondary | ICD-10-CM | POA: Diagnosis not present

## 2014-08-26 MED ORDER — NYSTATIN-TRIAMCINOLONE 100000-0.1 UNIT/GM-% EX OINT: 1.0000 "application " | TOPICAL_OINTMENT | Freq: Two times a day (BID) | CUTANEOUS | Status: DC

## 2014-08-26 MED ORDER — FLUCONAZOLE 100 MG PO TABS: 100.0000 mg | ORAL_TABLET | Freq: Every day | ORAL | Status: DC

## 2014-08-26 NOTE — Progress Notes (Signed)
54 y.o.Married white [id number] here with complaint of vaginal symptoms of itching, burning, and increase discharge. Also has noted either bump on right vulva area or swelling. Discharge noted is white thick and watery at times, no odor. Patient has been on three courses of antibiotics for pneumonia in the past 7-8 weeks. Currently on Augmentin. Patient is also diabetic on Metformin.  Patient is very sore in groin area from irritation. Onset of symptoms 6-7 days ago. Denies new personal products or vaginal dryness.  Urinary symptoms none . Menopausal on ERT. No other health issues today.   O:Healthy female WDWN Affect: normal, orientation x 3  Exam: Abdomen:soft, non tender  Inguinal Lymph node: no enlargement or tenderness Pelvic exam: External genital: normal female, with excoriation noted on vulva area with scaling of white exudate, tender to touch, cracking areas also noted on both vulva. No lesions or blisters. Creases of groin area also increase pink with same exudate as vulva  Wet prep taken BUS: negative Vagina: white thick discharge noted. Ph: 4.0  ,Wet prep taken,  Cervix: absent Uterus: absent Adnexa:absent, no masses or fullness noted   Wet Prep results:yeast vaginal and vulva   A:Normal pelvic exam Yeast vaginitis/vulvitis Pneumonia under treatment Diabetes on metformin, good control   P:Discussed findings of yeast vaginitis/vulvitis and etiology. Discussed Aveeno sitz bath for comfort. Avoid moist clothes or pads for extended period of time.  Discussed the Aveeno with provide comfort to all vulva and groin area if used and instructions given. Start on oral refrigerated probiotic to re-establish normal GI flora to help with increase in immune system to decrease reoccurrence. Given information. Rx: Diflucan see order Rx: Mycolog generic cream see order with instructions. Questions addressed regarding treatment. If not resolving or improving in the next 5- 7 days needs to advise  for recheck of area. Patient agreeable.  Rv prn

## 2014-08-26 NOTE — Patient Instructions (Signed)

## 2014-08-29 NOTE — Progress Notes (Signed)
Reviewed personally.  M. Suzanne Tykeshia Tourangeau, MD.  

## 2014-08-30 ENCOUNTER — Other Ambulatory Visit (HOSPITAL_COMMUNITY): Payer: Self-pay | Admitting: Psychiatry

## 2014-08-30 ENCOUNTER — Other Ambulatory Visit: Payer: Self-pay | Admitting: Internal Medicine

## 2014-09-06 ENCOUNTER — Encounter: Payer: Self-pay | Admitting: Family Medicine

## 2014-09-06 ENCOUNTER — Ambulatory Visit (INDEPENDENT_AMBULATORY_CARE_PROVIDER_SITE_OTHER): Payer: 59 | Admitting: Family Medicine

## 2014-09-06 VITALS — BP 151/90 | HR 73 | Temp 98.3°F | Wt 197.0 lb

## 2014-09-06 DIAGNOSIS — J0140 Acute pansinusitis, unspecified: Secondary | ICD-10-CM

## 2014-09-06 MED ORDER — DOXYCYCLINE HYCLATE 100 MG PO TABS: 100.0000 mg | ORAL_TABLET | Freq: Two times a day (BID) | ORAL | Status: DC

## 2014-09-06 MED ORDER — DEXAMETHASONE SODIUM PHOSPHATE 10 MG/ML IJ SOLN
10.0000 mg | Freq: Once | INTRAMUSCULAR | Status: AC
  Administered 2014-09-06: 10 mg via INTRAMUSCULAR

## 2014-09-06 NOTE — Patient Instructions (Addendum)
Thank you for coming in, today!  I think you still do have a sinus infection and lots of inflammation causing your pain and congestion. I will give you a steroid shot today that should help with the inflammation. I want you to take a different antibiotic, doxycycline, twice a day for 10 days. Keep taking your fluconazole to help prevent any more yeast infections.  You can keep taking Mucinex and Tessalon for your other symptoms. Try wearing a 2M mask at work to see if that helps reduce dust and allergies. You can also take Claritin (loratadine), Zyrtec (cetirizine), or another non-drowsy over-the-counter antihistamine to help with congestion and allergies, as well. Otherwise, come back to see [place] aa you need.  Please feel free to call with any questions or concerns at any time, at [phone]. --Dr. Venetia Maxon

## 2014-09-06 NOTE — Progress Notes (Signed)
   Subjective:    Patient ID: [name], female    DOB: Jun 09, 1960, 54 y.o.   MRN: [id number]  HPI: Pt presents to clinic for SDA for persistent sinusitis-type symptoms. She was seen on 5/31 and treated with Augmentin, then again on 6/6, and she has had no improvement in her symptoms; the Augmentin was for pharyngitis and possible early PNA, but she had a clear CXR on 6/6. She was prescribed Cortisporin ear drops, as well, which have helped her ear pain some. She has taken Flonase and Mucinex which has helped only a very little (Mucinex was more helpful, she thinks). She reports continued sinus pressure / pain, ear pain, sore throat, headache. She still has some cough, but it is some better; Tessalon has helped with this.  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 She thinks she may be allergic to dust but has never had much trouble with sinuses in the past. She reports she required a visit at her OBGYN's office for a "severe yeast infection" after her abx treatment, which has cleared up with Diflucan, but she was told to take Diflucan daily for 30 days.  Review of Systems: As above.     Objective:   Physical Exam BP 151/90 mmHg  Pulse 73  Temp(Src) 98.3 F (36.8 C) (Oral)  Wt 197 lb (89.359 kg) Gen: non-toxic but very uncomfortable-appearing adult female in NAD HEENT: [place]/AT, EOMI, PERRLA, MMM, TM's clear bilaterally  Nasal mucosae and posterior oropharynx moderately erythematous  Minimal nasal discharge in nasal vault, with mild tonsillar swelling  Marked tenderness across sinuses, especially maxillary areas bilaterally Neck: supple, normal ROM, mild shotty anterior cervical lymphadenopathy Cardio: RRR, no murmur appreciated Pulm: CTAB, no wheezes, normal WOB Abd: soft, nontender, BS+ Ext: warm, well-perfused, no LE edema     Assessment & Plan:  54yo female with subacute pansinusitis with severe symptoms despite tx with Augmentin and various  prescription and OTC meds - discussed various options with pt, including second round of abx, steroid therapy, etc - decision made to treat with doxycycline for 10 days and give Decadron IM today to help with inflammation - counseled on possible side effects of systemic steroids - advised to continue Diflucan from OBGYN given second course of abx and use of systemic steroids - f/u as needed, otherwise  Emmaline Kluver, MD PGY-3, [place] [facility] 09/06/2014, 4:59 PM

## 2014-09-07 ENCOUNTER — Ambulatory Visit: Payer: Self-pay | Admitting: Family Medicine

## 2014-09-10 ENCOUNTER — Ambulatory Visit: Payer: Self-pay | Admitting: Family Medicine

## 2014-09-13 ENCOUNTER — Other Ambulatory Visit: Payer: Self-pay

## 2014-09-14 ENCOUNTER — Encounter: Payer: Self-pay | Admitting: Family Medicine

## 2014-09-14 ENCOUNTER — Ambulatory Visit (INDEPENDENT_AMBULATORY_CARE_PROVIDER_SITE_OTHER): Payer: 59 | Admitting: Family Medicine

## 2014-09-14 VITALS — BP 131/87 | Temp 98.6°F | Ht 65.0 in | Wt 198.8 lb

## 2014-09-14 DIAGNOSIS — J0101 Acute recurrent maxillary sinusitis: Secondary | ICD-10-CM | POA: Diagnosis not present

## 2014-09-14 DIAGNOSIS — B373 Candidiasis of vulva and vagina: Secondary | ICD-10-CM | POA: Diagnosis not present

## 2014-09-14 DIAGNOSIS — B3731 Acute candidiasis of vulva and vagina: Secondary | ICD-10-CM

## 2014-09-14 DIAGNOSIS — J0100 Acute maxillary sinusitis, unspecified: Secondary | ICD-10-CM | POA: Insufficient documentation

## 2014-09-14 MED ORDER — LEVOFLOXACIN 750 MG PO TABS: 750.0000 mg | ORAL_TABLET | Freq: Every day | ORAL | Status: DC

## 2014-09-14 MED ORDER — FLUCONAZOLE 100 MG PO TABS: 100.0000 mg | ORAL_TABLET | Freq: Every day | ORAL | Status: DC

## 2014-09-14 MED ORDER — PREDNISONE 20 MG PO TABS: 40.0000 mg | ORAL_TABLET | Freq: Every day | ORAL | Status: DC

## 2014-09-14 MED ORDER — CETIRIZINE HCL 10 MG PO TABS: 10.0000 mg | ORAL_TABLET | Freq: Every day | ORAL | Status: DC

## 2014-09-14 NOTE — Patient Instructions (Signed)
I am calling in a medication called Levaquin, you'll take one pill a day for 5 days. I have also called in Diflucan in the event that you get a yeast infection. Please pick up some nasal saline, and flesh her nasal passages 4 times a day. Continue to use her Flonase, I will also call in an aunt histamine called Zyrtec. You'll take Zyrtec daily. I have called in a steroid for you to take for 5 days only. If this does not resolve your sinusitis, will refer you to an ENT.

## 2014-09-15 ENCOUNTER — Telehealth: Payer: Self-pay | Admitting: Family Medicine

## 2014-09-15 ENCOUNTER — Encounter: Payer: Self-pay | Admitting: Family Medicine

## 2014-09-15 NOTE — Assessment & Plan Note (Signed)
[name] is a 53 y.o. female present for persistent left sided facial pain and pressure after 2 antibiotic courses, steroid injection and Flonase use over 6 weeks.  - Discussed with pt the possibility of sinus abscess given history of course, and exam today being with a left-sided signs only. Please see exam above. We have decided to obtain a coronal CT of the head/sinuses to rule out abscess or pathology. Creatinine ordered to be completed prior to CT to make certain that she can have contrast. - Given that she had a trial of Augmentin, then doxycycline, will attempt Levaquin 750 mg daily for 5 days. Creatinine has been normal in the past. Also placed on 40 mg prednisone daily 5 days. Continue Flonase. Encouraged nasal saline flushes 4 times a day. Added Zyrtec as well. - Depending on CT results and clinical results, would refer to ENT if any further complications. - Follow-up with PCP in 2 weeks.

## 2014-09-15 NOTE — Progress Notes (Signed)
Subjective:    Patient ID: [name], female    DOB: [birth year], 54 y.o.   MRN: [id number]  HPI  Left-sided sinus pain: Patient presents to family medicine clinic for third visit for sinus pressure and pain. Patient states that she came in approximately 5-6 weeks ago with what she felt was a normal sinus infection. There was concerns at that time for possible strep throat as well. Patient was treated with Augmentin and Flonase. Patient states that she never felt completely better. She returned a few weeks after initial treatment with repeat symptoms, and it was felt she had a persistent sinus infection. She was given doxycycline 10 day course, as well as the right steroid injection. Patient states she felt minimal improvement after the second treatment. She denies any fever, but positive for occasional chills. Her appetite is decreased, but she is tolerating by mouth.   Former smoker Past Medical History  Diagnosis Date  . Borderline diabetic   . Weight gain   . Sleep deprivation     loss of sleep  . Fatigue   . Shortness of breath     with pain   . Nausea   . Blurred vision   . Wears glasses   . Arthritis   . Migraine headache   . History of measles, mumps, or rubella   . H/O varicella   . Hx of ovarian cyst   . Yeast infection   . H/O bacterial infection   . Back pain   . Depression   . 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 Anxiety   . Diabetes mellitus without complication    Allergies  Allergen Reactions  . Sulfa Antibiotics Anaphylaxis, Hives and Swelling   Past Surgical History  Procedure Laterality Date  . Abdominal hysterectomy    . Oophorectomy  2009  . Cholecystectomy  11/13/10  . Dilation and curettage of uterus    . Tubal ligation    . Dg gall bladder        Review of Systems Per HPI    Objective:   Physical Exam BP 131/87 mmHg  Temp(Src) 98.6 F (37 C) (Oral)  Ht 5\' 5"   (1.651 m)  Wt 198 lb 12.8 oz (90.175 kg)  BMI 33.08 kg/m2 Gen: NAD. Nontoxic in appearance, well-developed, well-nourished, African-American female. Appears fatigued. HEENT: AT. [place]. Bilateral TM visualized, right tympanic membrane dull. Left tympanic membrane shiny/full, no erythema, no canal edema. Bilateral eyes without erythema, injections, drainage or icterus. MMM. Bilateral nares with pale mucosal lining right side, left knee light of nostril with large turbinate swelling, mild erythema, no drainage or bleeding noted. Throat without erythema or exudates. Mildly hoarse. Facial pressure and pain over left maxillary sinus, as well as left frontal sinus. CV: RRR  Chest: CTAB, no wheeze or crackles     Assessment & Plan:  [name] is a 54 y.o. female present for persistent left sided facial pain and pressure after 2 antibiotic courses, steroid injection and Flonase use over 6 weeks.  - Discussed with pt the possibility of sinus abscess given history of course, and exam today being with a left-sided signs only. Please see exam above. We have decided to obtain a coronal CT of the head/sinuses to rule out abscess or pathology. Creatinine ordered to be completed prior to CT to make certain that she can have contrast. - Given that she had a trial of Augmentin,  then doxycycline, will attempt Levaquin 750 mg daily for 5 days. Creatinine has been normal in the past. Also placed on 40 mg prednisone daily 5 days. Continue Flonase. Encouraged nasal saline flushes 4 times a day. Added Zyrtec as well. - Depending on CT results and clinical results, would refer to ENT if any further complications. - Follow-up with PCP in 2 weeks.   25 minutes or more of face to face counseling with the patient.

## 2014-09-15 NOTE — Telephone Encounter (Signed)
Spoke to patient today about obtaining CT of sinus/head since she has had persistent sinus pressure/pain and now three types of abx treatment, with an exam that only revealed left sided signs concerning for abscess of left maxillary sinus. Patient will need creatinine checked prior since CT with contrast would be preferred. These orders have been placed and patient agrees with plan. She will need to be called for scheduling. Thank you.

## 2014-09-16 ENCOUNTER — Other Ambulatory Visit: Payer: 59

## 2014-09-16 DIAGNOSIS — J0101 Acute recurrent maxillary sinusitis: Secondary | ICD-10-CM

## 2014-09-16 LAB — BASIC METABOLIC PANEL
BUN: 14 mg/dL (ref 6–23)
CO2: 24 mEq/L (ref 19–32)
Calcium: 9.7 mg/dL (ref 8.4–10.5)
Chloride: 106 mEq/L (ref 96–112)
Creat: 1.04 mg/dL (ref 0.50–1.10)
Glucose, Bld: 139 mg/dL — ABNORMAL HIGH (ref 70–99)
Potassium: 4.4 mEq/L (ref 3.5–5.3)
Sodium: 141 mEq/L (ref 135–145)

## 2014-09-16 NOTE — Progress Notes (Signed)
BMP DONE TODAY Miyoko Hashimi 

## 2014-09-24 ENCOUNTER — Ambulatory Visit (HOSPITAL_COMMUNITY): Payer: 59

## 2014-09-27 ENCOUNTER — Ambulatory Visit (HOSPITAL_COMMUNITY)
Admission: RE | Admit: 2014-09-27 | Discharge: 2014-09-27 | Disposition: A | Discharge status: Home / Self Care | Payer: 59 | Source: Ambulatory Visit | Attending: Family Medicine | Admitting: Family Medicine

## 2014-09-27 ENCOUNTER — Encounter (HOSPITAL_COMMUNITY): Payer: Self-pay

## 2014-09-27 DIAGNOSIS — K029 Dental caries, unspecified: Secondary | ICD-10-CM | POA: Insufficient documentation

## 2014-09-27 DIAGNOSIS — J0101 Acute recurrent maxillary sinusitis: Secondary | ICD-10-CM

## 2014-09-27 DIAGNOSIS — R51 Headache: Secondary | ICD-10-CM | POA: Diagnosis not present

## 2014-09-27 MED ORDER — IOHEXOL 300 MG/ML  SOLN
100.0000 mL | Freq: Once | INTRAMUSCULAR | Status: AC | PRN
  Administered 2014-09-27: 100 mL via INTRAVENOUS

## 2014-09-28 ENCOUNTER — Other Ambulatory Visit: Payer: Self-pay | Admitting: Family Medicine

## 2014-09-28 ENCOUNTER — Ambulatory Visit: Payer: Self-pay

## 2014-09-28 ENCOUNTER — Ambulatory Visit (HOSPITAL_COMMUNITY): Admission: RE | Admit: 2014-09-28 | Payer: Self-pay | Source: Ambulatory Visit

## 2014-09-28 DIAGNOSIS — J0101 Acute recurrent maxillary sinusitis: Secondary | ICD-10-CM

## 2014-09-28 NOTE — Patient Outreach (Signed)
[facility]) Care Management  09/28/2014  [name] [birth year] [id number]   Member arrived for 09/28/14 appoint for Link to Wellness.  States that her husband is in the emergency room at [facility] but she did not want to miss her appointment. Instructed member that we can reschedule her appointment for today.  She has an endocrinology appointment scheduled for 10/08/14 with Dr.Gherghe.   Appointment rescheduled for 11/11/14.  Instructed to call RNCM if she has any needs before that time. [name] RN, [facility] Care Management Coordinator-Link to [place] Management [phone]

## 2014-10-01 ENCOUNTER — Other Ambulatory Visit: Payer: Self-pay | Admitting: Family Medicine

## 2014-10-01 ENCOUNTER — Telehealth: Payer: Self-pay | Admitting: Family Medicine

## 2014-10-01 MED ORDER — DOXYCYCLINE HYCLATE 100 MG PO TABS: 100.0000 mg | ORAL_TABLET | Freq: Two times a day (BID) | ORAL | Status: DC

## 2014-10-01 NOTE — Telephone Encounter (Signed)
CT maxilla result discussed with patient. 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 Patient stated she is still having fullness feeling on her face with a lot of sinus drainage. She recently saw her dentist and no treatment was given. Plan to treat with Doxycycline pending her follow up visit with Dr Berkley Harvey. Tylenol prn pain.  IMPRESSION: No acute finding, with specifically no significant paranasal sinus disease.  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

## 2014-10-01 NOTE — Telephone Encounter (Signed)
I did not see her recently, could you please clarify what test result she is talking about.

## 2014-10-01 NOTE — Telephone Encounter (Signed)
Pt called and would like to know what her test results are. jw  °

## 2014-10-01 NOTE — Telephone Encounter (Signed)
Will forward to PCP for review. Dorothee Napierkowski, CMA. 

## 2014-10-01 NOTE — Telephone Encounter (Signed)
Dr. Raoul Pitch saw pt on 09/14/14 and ordered CT scan and wanted pt to FU with PCP in 1-2 weeks. CT scan needs to be reviewed for pt. Norita Meigs, CMA.

## 2014-10-08 ENCOUNTER — Ambulatory Visit (INDEPENDENT_AMBULATORY_CARE_PROVIDER_SITE_OTHER): Payer: 59 | Admitting: Internal Medicine

## 2014-10-08 ENCOUNTER — Other Ambulatory Visit (INDEPENDENT_AMBULATORY_CARE_PROVIDER_SITE_OTHER): Payer: 59 | Admitting: *Deleted

## 2014-10-08 ENCOUNTER — Encounter: Payer: Self-pay | Admitting: Internal Medicine

## 2014-10-08 ENCOUNTER — Encounter: Payer: Self-pay | Admitting: Family Medicine

## 2014-10-08 ENCOUNTER — Ambulatory Visit (INDEPENDENT_AMBULATORY_CARE_PROVIDER_SITE_OTHER): Payer: 59 | Admitting: Family Medicine

## 2014-10-08 VITALS — BP 118/73 | HR 82 | Temp 98.2°F | Wt 198.0 lb

## 2014-10-08 VITALS — BP 112/68 | HR 73 | Temp 98.2°F | Resp 12 | Wt 198.2 lb

## 2014-10-08 DIAGNOSIS — R519 Headache, unspecified: Secondary | ICD-10-CM | POA: Insufficient documentation

## 2014-10-08 DIAGNOSIS — R7309 Other abnormal glucose: Secondary | ICD-10-CM

## 2014-10-08 DIAGNOSIS — E785 Hyperlipidemia, unspecified: Secondary | ICD-10-CM | POA: Diagnosis not present

## 2014-10-08 DIAGNOSIS — R7303 Prediabetes: Secondary | ICD-10-CM

## 2014-10-08 DIAGNOSIS — R51 Headache: Secondary | ICD-10-CM

## 2014-10-08 LAB — POCT GLYCOSYLATED HEMOGLOBIN (HGB A1C): Hemoglobin A1C: 6.3

## 2014-10-08 NOTE — Assessment & Plan Note (Signed)
Pertinent S&O  Intermittent left-sided HA, periorbital pain, "sinus pressure/fullness", rhinorrhea, ear fullness  No fevers - has had multiple abx treatments for possible sinusitis without change / improvement  TMJ clunk on left side and b/l swollen nasal turbinates  MRI showed no significant paranasal sinus disease. 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 Denies tooth pain ,but has left maxillary bridge  Assessment  Symptoms possibly due to Temporomandibular disorder vs dental pathology Plan  Patient educated on TMJ and home exercises  Advised to follow-up with dentist for assessment  Patient declined TMJ medications - but will call if decides to try baclofen  She has changed jobs - will reassess in a few weeks to see if symptoms and swollen turbinates improve  Consider ENT referral if symptoms persist

## 2014-10-08 NOTE — Patient Instructions (Signed)
Please continue Metformin 500 mg 2x a day.  Continue Pravachol 40 mg at night.   Please return in 3-4 months with your sugar log.

## 2014-10-08 NOTE — Progress Notes (Signed)
   Subjective:    Patient ID: [name], female    DOB: [birth year], 54 y.o.   MRN: [id number]  Seen for Same day visit for   CC: HA, left check pain, sinus congestion  She reports several months of left sided HA and left temporal and check pain. Associated with intermittent sinus congestion, rhinorrhea, and ear fullness. Denies fevers. 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 Reports history of migraine, but this HA is different. Occurs most days but goes away completely between episodes. HA last for hours when comes. Using Mobic daily and Excedrin prn.  Denies vision problems or eye pain. Denies rash. Has Left maxillary dental bridge; Denies tooth pain.   Review of Systems   See HPI for ROS. Objective:  BP 118/73 mmHg  Pulse 82  Temp(Src) 98.2 F (36.8 C) (Oral)  Wt 198 lb (89.812 kg)  General: NAD HEENT: Swollen nasal turbinates b/l. TMs clear. Sclera clear. No temporal tenderness to palpation. Palpable clunk at left TMJ with click on ausculation      Assessment & Plan:  See Problem List Documentation

## 2014-10-08 NOTE — Progress Notes (Signed)
Patient ID: KESTREL MIS, female   DOB: February 01, 1961, 54 y.o.   MRN: [id number]  HPI: [name] is a 54 y.o.-year-old female, initially referred by her PCP, Dr. Judeth Horn, for management of prediabetes, dx early 2000s, controlled, without complications and hyperlipidemia. Last visit 4 mo ago.  Since last OV, she had the flu, PNA, steroid inj's, and now she has sinus drainage and HAs. She was on Doxycycline, Flonase, Amoxycillin, Flagyl.  Last hemoglobin A1c was: Lab Results  Component Value Date   HGBA1C 6.6* 06/07/2014   HGBA1C 6.3 01/22/2014   HGBA1C 6.4* 10/14/2013  She had several steroid inj in last year- last in 04/2014.   She is on: - Metformin 500 mg 2x a day - She initially had diarrhea with it, now resolved.  Pt checks sugars 0-1x a day: - am: 88-109 >> 85-100, 115 x1 >> 70-90 - lunch: 95-101 >> n/c - 2h after lunch: 130-135 >> 110-125 - 2h after dinner: 165-175 (when she has a larger dinner or dessert) >> 140's No lows. Lowest sugar was 97 >> 80 >> 75; ? she has hypoglycemia awareness  She has a OneTouch Ultra glucometer.  Pt's meals are better, after she saw nutrition and [facility]. She walks for exercise. 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   - no CKD, last BUN/creatinine:  Lab Results  Component Value Date   BUN 14 09/16/2014   CREATININE 1.04 09/16/2014   - last eye exam was in 04/2014. No DR. - no numbness and tingling in feet.  HL: Reviewed last 3 sets of lipids - much improved at last check, after we started Pravastatin 40 mg.  Lab Results  Component Value Date   CHOL 197 02/05/2014   CHOL 234* 10/14/2013   CHOL 240* 10/05/2011   Lab Results  Component Value Date   HDL 45 02/05/2014   HDL 51 10/14/2013   HDL 49 10/05/2011   Lab Results  Component Value Date   LDLCALC 117* 02/05/2014   LDLCALC 156* 10/14/2013   LDLCALC 163* 10/05/2011   Lab Results  Component Value Date   TRIG 177* 02/05/2014   TRIG 136  10/14/2013   TRIG 142 10/05/2011   Lab Results  Component Value Date   CHOLHDL 4.4 02/05/2014   CHOLHDL 4.6 10/14/2013   CHOLHDL 4.9 10/05/2011   Her last LFTs are normal: Lab Results  Component Value Date   ALT 8 02/05/2014   AST 13 02/05/2014   ALKPHOS 83 02/05/2014   BILITOT <0.2 02/05/2014   Has FH of HL in sister.  I reviewed pt's medications, allergies, PMH, social hx, family hx, and changes were documented in the history of present illness. Otherwise, unchanged from my initial visit note.  ROS: Constitutional: + weight gain, + fatigue, no subjective hyperthermia, + poor sleep Eyes: No blurry vision, no xerophthalmia ENT: + sore throat, no nodules palpated in throat, no dysphagia/odynophagia, no hoarseness Cardiovascular: no CP/SOB/palpitations/leg swelling Respiratory: + cough/no SOB/+ wheezing Gastrointestinal: + N/+ V/no D/C Musculoskeletal: no muscle/joint aches Skin: no rashes Neurological: no tremors/numbness/tingling/dizziness, + HA (L side)  I reviewed pt's medications, allergies, PMH, social hx, family hx, and changes were documented in the history of present illness. Otherwise, unchanged from my initial visit note.  PE: BP 112/68 mmHg  Pulse 73  Temp(Src) 98.2 F (36.8 C) (Oral)  Resp 12  Wt 198 lb 3.2 oz (89.903 kg)  SpO2 97% Wt Readings from Last 3 Encounters:  10/08/14 198 lb  3.2 oz (89.903 kg)  09/14/14 198 lb 12.8 oz (90.175 kg)  09/06/14 197 lb (89.359 kg)   Constitutional: overweight, in NAD Eyes: PERRLA, EOMI, no exophthalmos ENT: moist mucous membranes, no thyromegaly, no cervical lymphadenopathy Cardiovascular: RRR, No MRG Respiratory: CTA B Gastrointestinal: abdomen soft, NT, ND, BS+ Musculoskeletal: no deformities, strength intact in all 4 Skin: moist, warm, no rashes Neurological: no tremor with outstretched hands, DTR normal in all 4  ASSESSMENT: 1. Prediabetes  2. HL  PLAN:  1. Patient has a history of prediabetes, for  which she is on metformin 500 mg 2x a day. Her hemoglobin A1c was slightly higher at last check, in the DM range. We reviewed her CBGs together >> all at goal now and lower than before. - I suggested to continue Metformin 500 mg 2x a day - continue checking sugars at different times of the day - check once a day or every other day, rotating checks - given a new sugar log - advised for yearly eye exams - she is UTD - check HbA1c today >> 6.3% - She improved her diet after seeing nutrition - Return to clinic in 3-4 mo with sugar log  2. HL - Pt with high LDL, also prediabetic, obese - Lipids improved with Pravachol 40 mg at night >> she tolerates the medication well >> will continue this dose - will check LFTs and Lipids in 01/2015

## 2014-10-22 ENCOUNTER — Other Ambulatory Visit: Payer: Self-pay

## 2014-10-22 ENCOUNTER — Encounter: Payer: Self-pay | Admitting: Obstetrics and Gynecology

## 2014-10-22 ENCOUNTER — Ambulatory Visit (INDEPENDENT_AMBULATORY_CARE_PROVIDER_SITE_OTHER): Payer: 59 | Admitting: Obstetrics and Gynecology

## 2014-10-22 VITALS — BP 98/60 | HR 68 | Resp 16 | Ht 64.0 in | Wt 201.0 lb

## 2014-10-22 DIAGNOSIS — Z01419 Encounter for gynecological examination (general) (routine) without abnormal findings: Secondary | ICD-10-CM

## 2014-10-22 DIAGNOSIS — Z0000 Encounter for general adult medical examination without abnormal findings: Secondary | ICD-10-CM | POA: Diagnosis not present

## 2014-10-22 DIAGNOSIS — Z1231 Encounter for screening mammogram for malignant neoplasm of breast: Secondary | ICD-10-CM

## 2014-10-22 DIAGNOSIS — N393 Stress incontinence (female) (male): Secondary | ICD-10-CM

## 2014-10-22 DIAGNOSIS — Z139 Encounter for screening, unspecified: Secondary | ICD-10-CM

## 2014-10-22 LAB — POCT URINALYSIS DIPSTICK
Bilirubin, UA: NEGATIVE
Blood, UA: 7
Glucose, UA: NEGATIVE
Ketones, UA: NEGATIVE
Leukocytes, UA: NEGATIVE
Nitrite, UA: NEGATIVE
Protein, UA: NEGATIVE
Urobilinogen, UA: NEGATIVE

## 2014-10-22 MED ORDER — ESTRADIOL 1 MG PO TABS: 1.0000 mg | ORAL_TABLET | Freq: Two times a day (BID) | ORAL | Status: DC

## 2014-10-22 NOTE — Patient Instructions (Signed)

## 2014-10-22 NOTE — Progress Notes (Addendum)
54 y.o. [id number] Married [place] American female here for annual exam.   On Estrace 1 mg twice daily.  Still has hot flashes.   HGB A1c 6.3. Working at [employer] office.  Frustrated with her weight.   Doing steroid injections for back pain.   Urinary frequency.  Some stress incontinence. Drinking coffee.  PCP:   Andrena Mews  No LMP recorded. Patient has had a hysterectomy.          Sexually active: Yes.    The current method of family planning is status post hysterectomy.    Exercising: No.  The patient does not participate in regular exercise at present. Smoker:  no  Health Maintenance: Pap:  10/14/13 neg. HR HPV:neg History of abnormal Pap:  Yes, 09/2011 LGSIL  MMG:  10/21/13 BIRADS1:Neg Colonoscopy:  06/2013 Normal - repeat 10 years  BMD:   Never   TDaP:  2014 Screening Labs:  Hb today: ?, Urine today: Negative   reports that she has quit smoking. Her smoking use included Cigarettes. She smoked 0.50 packs per day. She has never used smokeless tobacco. She reports that she drinks alcohol. She reports that she does not use illicit drugs.  Past Medical History  Diagnosis Date  . Borderline diabetic   . Weight gain   . Sleep deprivation     loss of sleep  . Fatigue   . Shortness of breath     with pain   . Nausea   . Blurred vision   . Wears glasses   . Arthritis   . Migraine headache   . History of measles, mumps, or rubella   . H/O varicella   . Hx of ovarian cyst   . Yeast infection   . H/O bacterial infection   . Back pain   . Depression   . 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 Anxiety   . Diabetes mellitus without complication     Past Surgical History  Procedure Laterality Date  . Abdominal hysterectomy    . Oophorectomy  2009  . Cholecystectomy  11/13/10  . Dilation and curettage of uterus    . Tubal ligation    . Dg gall bladder      Current Outpatient Prescriptions   Medication Sig Dispense Refill  . cetirizine (ZYRTEC) 10 MG tablet Take 1 tablet (10 mg total) by mouth daily. (Patient not taking: Reported on 10/22/2014) 30 tablet 11  . Ciclopirox 1 % shampoo Apply 1 application topically at bedtime.    Marland Kitchen doxycycline (VIBRA-TABS) 100 MG tablet Take 1 tablet (100 mg total) by mouth 2 (two) times daily. (Patient not taking: Reported on 10/22/2014) 14 tablet 0  . estradiol (ESTRACE) 1 MG tablet Take 1 tablet (1 mg total) by mouth 2 (two) times daily. 60 tablet 8  . fluticasone (FLONASE) 50 MCG/ACT nasal spray Place 2 sprays into both nostrils daily. (Patient not taking: Reported on 10/22/2014) 16 g 6  . HYDROcodone-acetaminophen (NORCO) 7.5-325 MG per tablet Take 1 tablet by mouth daily as needed for moderate pain.    Marland Kitchen ibuprofen (ADVIL,MOTRIN) 400 MG tablet Take 1 tablet (400 mg total) by mouth every 6 (six) hours as needed. 30 tablet 0  . meloxicam (MOBIC) 7.5 MG tablet Take 7.5 mg by mouth 2 (two) times daily.     . metFORMIN (GLUCOPHAGE) 500 MG tablet TAKE 1 TABLET (500 MG TOTAL) BY MOUTH 2 (TWO) TIMES DAILY  WITH A MEAL. 60 tablet 2  . pravastatin (PRAVACHOL) 40 MG tablet TAKE 1 TABLET BY MOUTH DAILY. 30 tablet 2   No current facility-administered medications for this visit.    Family History  Problem Relation Age of Onset  . Hypertension Mother   . Hypotension Mother   . Anemia Mother     low iron  . Heart disease Sister   . Sickle cell trait Other   . Alcohol abuse Brother   . Alcohol abuse Brother   . Drug abuse Brother     ROS:  Pertinent items are noted in HPI.  Otherwise, a comprehensive ROS was negative.  Exam:   BP 98/60 mmHg  Pulse 68  Resp 16  Ht 5\' 4"  (1.626 m)  Wt 201 lb (91.173 kg)  BMI 34.48 kg/m2    General appearance: alert, cooperative and appears stated age Head: Normocephalic, without obvious abnormality, atraumatic Neck: no adenopathy, supple, symmetrical, trachea midline and thyroid normal to inspection and  palpation Lungs: clear to auscultation bilaterally Breasts: normal appearance, no masses or tenderness, Inspection negative, No nipple retraction or dimpling, No nipple discharge or bleeding, No axillary or supraclavicular adenopathy Heart: regular rate and rhythm Abdomen: soft, non-tender; bowel sounds normal; no masses,  no organomegaly Extremities: extremities normal, atraumatic, no cyanosis or edema Skin: Skin color, texture, turgor normal. No rashes or lesions Lymph nodes: Cervical, supraclavicular, and axillary nodes normal. No abnormal inguinal nodes palpated Neurologic: Grossly normal  Pelvic: External genitalia:  no lesions              Urethra:  normal appearing urethra with no masses, tenderness or lesions              Bartholins and Skenes: normal                 Vagina: normal appearing vagina with normal color and discharge, no lesions              Cervix: absent              Pap taken: No. Bimanual Exam:  Uterus:  uterus absent              Adnexa: no mass, fullness, tenderness              Rectovaginal: Yes.  .  Confirms.              Anus:  normal sphincter tone, no lesions  Chaperone was present for exam.  Assessment:   Well woman visit with normal exam. Status post TVH.  Status post lap BSO. ERT patient.  Weight gain.  Genuine stress incontinence.  Plan: Yearly mammogram recommended after age 43.  Discussed 3D. Recommended self breast exam.  Pap and HR HPV as above. Discussed Calcium, Vitamin D, regular exercise program including cardiovascular and weight bearing exercise. Labs performed.  No..    Labs with PCP and medical providers. Refills given on medications.  Yes.  .  See orders.  Estrace 1 mg po bid.  #60, RF one. Discussed risks of DVT, PE, MI, stroke, breast CA.  No further refills until does mammogram and normal. Discussed Kegels.  Declines referral for pelvic floor therapy and declines surgical care.  ACOG handout on urinary incontinence.   Discussed bariatric surgery seminar through [facility].   Follow up annually and prn.      After visit summary provided.

## 2014-11-05 ENCOUNTER — Other Ambulatory Visit: Payer: Self-pay

## 2014-11-05 VITALS — BP 110/70 | HR 80 | Resp 16 | Ht 65.5 in | Wt 200.2 lb

## 2014-11-05 DIAGNOSIS — R7303 Prediabetes: Secondary | ICD-10-CM

## 2014-11-05 NOTE — Patient Outreach (Signed)
[facility]) Care Management   11/05/2014  [name] [birth year] [id number]  [name] is an 54 y.o. female.   Member seen for follow up office visit for Link to Wellness program for self management of prediabetes  Subjective: Member states that her hemoglobin A1C was down to 6.3 when she saw Dr.Gherghe last month.  States that she had her last steroid shot in June and her blood sugar readings have been improving.  States that she stopped going to [employer] because she did not like the class at the center.  States she has not been as active since the weather has gotten hotter.  States she is trying to eat better but sometimes she just wants to eat something sweet or drink a soda  Objective:   Review of Systems  All other systems reviewed and are negative. Member did not bring meter to visit   Physical Exam  Today's Vitals   11/05/14 1352  BP: 110/70  Pulse: 80  Resp: 16  Height: 1.664 m (5' 5.5")  Weight: 200 lb 3.2 oz (90.81 kg)  SpO2: 98%  PainSc: 0-No pain     Current Medications:   Current Outpatient Prescriptions  Medication Sig Dispense Refill  . cetirizine (ZYRTEC) 10 MG tablet Take 1 tablet (10 mg total) by mouth daily. 30 tablet 11  . Ciclopirox 1 % shampoo Apply 1 application topically at bedtime.    Marland Kitchen estradiol (ESTRACE) 1 MG tablet Take 1 tablet (1 mg total) by mouth 2 (two) times daily. 60 tablet 1  . HYDROcodone-acetaminophen (NORCO) 7.5-325 MG per tablet Take 1 tablet by mouth daily as needed for moderate pain.    . meloxicam (MOBIC) 7.5 MG tablet Take 7.5 mg by mouth 2 (two) times daily.     . metFORMIN (GLUCOPHAGE) 500 MG tablet TAKE 1 TABLET (500 MG TOTAL) BY MOUTH 2 (TWO) TIMES DAILY WITH A MEAL. 60 tablet 2  . pravastatin (PRAVACHOL) 40 MG tablet TAKE 1 TABLET BY MOUTH DAILY. 30 tablet 2  . doxycycline (VIBRA-TABS) 100 MG tablet Take 1 tablet (100 mg total) by mouth 2 (two) times daily. (Patient not taking: Reported on  10/22/2014) 14 tablet 0  . fluticasone (FLONASE) 50 MCG/ACT nasal spray Place 2 sprays into both nostrils daily. (Patient not taking: Reported on 10/22/2014) 16 g 6  . ibuprofen (ADVIL,MOTRIN) 400 MG tablet Take 1 tablet (400 mg total) by mouth every 6 (six) hours as needed. (Patient not taking: Reported on 11/05/2014) 30 tablet 0   No current facility-administered medications for this visit.    Functional Status:   In your present state of health, do you have any difficulty performing the following activities: 11/05/2014  Hearing? N  Vision? N  Difficulty concentrating or making decisions? N  Walking or climbing stairs? N  Dressing or bathing? N  Doing errands, shopping? N    Fall/Depression Screening:    PHQ 2/9 Scores 11/05/2014 10/08/2014 09/14/2014 08/23/2014 08/17/2014 08/13/2014 07/02/2014  PHQ - 2 Score 1 0 1 0 0 1 1  PHQ- 9 Score - - - - - - -   [facility] CM Care Plan Problem One        Patient Outreach from 11/05/2014 in [place] Problem One  Potential for elevated blood sugars related to dx of prediabetes   Care Plan for Problem One  Active   [facility] Long Term Goal (31-90 days)  Member will maintain hemoglobin A1C at or below  6.4 until next Link to Wellness visit   [place] Term Goal Start Date  11/05/14   Interventions for Problem One Long Term Goal  Reviewed CHO counting and portion control, Reinforced on importance of regular exercise for glycemic control, Encouraged to go back to [employer] and given schedule for class at [place],     [place] Problem Two        Patient Outreach from 11/05/2014 in [place] Problem Two  Member with BMI of 32 and wishes to loss weight   Care Plan for Problem Two  Active   Interventions for Problem Two Long Term Goal   Given schedule for Weight Watchers class at [place], Reviewed portion sizes   [facility] Long Term Goal (31-90) days  Member will rejoin [employer] within the  next 90 days   [facility] Long Term Goal Start Date  11/05/14     Assessment:    Member seen for follow up office visit for Link to Wellness program for self management of prediabetes.  Member with hemoglobin A1C of 6.3 at last endocrinologist visit.  Member has not been watching diet as closely and has decreased activity.  Reports CBGs ranging 85-110 fasting and 140-150 post prandial   Plan:  Plan to eat 30-45 GM (2-3 servings) of carbohydrate a meal and 15 GM for snacks.  Plan to eat snacks with protein. Plan to check blood sugars 1-2 times a week fasting or 1  hours after eating with goals of less than 100 fasting or 140 or less after meals Plan to  go Weight [employer]chers classes at [facility] 11-12 Plan to walk and go to gym 3 days a week for 30-45 minutes. Try to do for 3 weeks in a row. Goal is to 150 minutes a week Plan to complete EMMI programs by 01/18/15 Plan to see Dr. Cruzita Lederer on 02/04/15 Plan to return to Link to Wellness 05/06/15  [name] RN, [facility] Care Management Coordinator-Link to [place] Management [phone]

## 2014-11-05 NOTE — Patient Instructions (Signed)
1. Plan to eat 30-45 GM (2-3 servings) of carbohydrate a meal and 15 GM for snacks.  Plan to eat snacks with protein. 2. Plan to check blood sugars 1-2 times a week fasting or 1  hours after eating with goals of less than 100 fasting or 140 or less after meals 3. Plan to  go Weight Watchers classes at [facility] 11-12 4. Plan to walk and go to gym 3 days a week for 30-45 minutes. Try to do for 3 weeks in a row. Goal is to 150 minutes a week 5. Plan to complete EMMI programs by 01/18/15 6. Plan to see Dr. Cruzita Lederer on 02/04/15 7. Plan to return to Link to Wellness 05/06/15 at 1:30PM

## 2014-11-10 ENCOUNTER — Ambulatory Visit (INDEPENDENT_AMBULATORY_CARE_PROVIDER_SITE_OTHER): Payer: 59 | Admitting: Family Medicine

## 2014-11-10 ENCOUNTER — Encounter: Payer: Self-pay | Admitting: Family Medicine

## 2014-11-10 VITALS — BP 125/75 | HR 89 | Temp 98.4°F

## 2014-11-10 DIAGNOSIS — L209 Atopic dermatitis, unspecified: Secondary | ICD-10-CM | POA: Diagnosis not present

## 2014-11-10 MED ORDER — TRIAMCINOLONE ACETONIDE 0.1 % EX CREA: 1.0000 "application " | TOPICAL_CREAM | Freq: Two times a day (BID) | CUTANEOUS | Status: DC

## 2014-11-10 MED ORDER — HYDROXYZINE HCL 10 MG PO TABS: 10.0000 mg | ORAL_TABLET | Freq: Three times a day (TID) | ORAL | Status: DC | PRN

## 2014-11-10 NOTE — Progress Notes (Signed)
Patient ID: CYNDEE GIAMMARCO, female   DOB: 28-Jul-1960, 54 y.o.   MRN: [id number]    Subjective: HK:VQQV HPI: Patient is a 54 y.o. female presenting to clinic today for a same day appt for rash.   RASH Had rash for 14 days. Location: started on upper back, went to lower back/buttocks, then to stomach. She's now noticed it on her R hand as well.  Medications tried: Eucerin cream didn't help Similar rash in past: no  New medications or antibiotics: no  Tick, Insect or new pet exposure: possibly (out in grass but hasn't seen anything).  Recent travel: no New detergent or soap: no  Immunocompromised: no  Symptoms Itching: terrible itching Patient can feel tiny bumps where the rash is, hard to see Pain over rash: no  Feeling ill all over: no Fever: no  Mouth sores: no  Face or tongue swelling: no  Trouble breathing: no  Joint swelling or pain: no   Social History: former smoker    ROS: All other systems reviewed and are negative.  Past Medical History Patient Active Problem List   Diagnosis Date Noted  . Atopic dermatitis 11/12/2014  . Left-sided face pain 10/08/2014  . Sinusitis, acute maxillary 09/14/2014  . Acute pharyngitis 08/13/2014  . Urine, incontinence, stress female 06/18/2014  . Left foot pain 06/18/2014  . Memory loss 01/22/2014  . Depression with anxiety 01/22/2014  . VAIN I (vaginal intraepithelial neoplasia grade I)   . Hyperlipemia 10/22/2013  . Wears glasses   . Arthritis   . History of measles, mumps, or rubella   . Back pain   . Sleep deprivation   . Fatigue   . Blurred vision   . Menopausal symptoms 10/05/2011  . Prediabetes 10/04/2008  . MIGRAINE HEADACHE 10/04/2008  . CHEST PAIN 10/04/2008    Medications- reviewed and updated Current Outpatient Prescriptions  Medication Sig Dispense Refill  . cetirizine (ZYRTEC) 10 MG tablet Take 1 tablet (10 mg total) by mouth daily. 30 tablet 11  . Ciclopirox 1 % shampoo Apply 1 application topically  at bedtime.    Marland Kitchen doxycycline (VIBRA-TABS) 100 MG tablet Take 1 tablet (100 mg total) by mouth 2 (two) times daily. (Patient not taking: Reported on 10/22/2014) 14 tablet 0  . estradiol (ESTRACE) 1 MG tablet Take 1 tablet (1 mg total) by mouth 2 (two) times daily. 60 tablet 1  . fluticasone (FLONASE) 50 MCG/ACT nasal spray Place 2 sprays into both nostrils daily. (Patient not taking: Reported on 10/22/2014) 16 g 6  . HYDROcodone-acetaminophen (NORCO) 7.5-325 MG per tablet Take 1 tablet by mouth daily as needed for moderate pain.    . hydrOXYzine (ATARAX/VISTARIL) 10 MG tablet Take 1 tablet (10 mg total) by mouth 3 (three) times daily as needed for itching. 30 tablet 0  . ibuprofen (ADVIL,MOTRIN) 400 MG tablet Take 1 tablet (400 mg total) by mouth every 6 (six) hours as needed. (Patient not taking: Reported on 11/05/2014) 30 tablet 0  . meloxicam (MOBIC) 7.5 MG tablet Take 7.5 mg by mouth 2 (two) times daily.     . metFORMIN (GLUCOPHAGE) 500 MG tablet TAKE 1 TABLET (500 MG TOTAL) BY MOUTH 2 (TWO) TIMES DAILY WITH A MEAL. 60 tablet 2  . pravastatin (PRAVACHOL) 40 MG tablet TAKE 1 TABLET BY MOUTH DAILY. 30 tablet 2  . triamcinolone cream (KENALOG) 0.1 % Apply 1 application topically 2 (two) times daily. 45 g 0   No current facility-administered medications for this visit.    Objective:  Office vital signs reviewed. BP 125/75 mmHg  Pulse 89  Temp(Src) 98.4 F (36.9 C) (Oral)   Physical Examination:  General: Awake, alert, well- nourished, NAD Skin: Dry scaly, slightly erythematous patch noted on the lateral aspect of her R elbow. Dry scaly, slightly lichenified region on her abdomen just inferior to the umbilicus. Dry, flesh colored skin that is slightly scaly over the upper back with excoriations noted.   MSK: No joint pain, effusions, erythema, or warmth.   Assessment/Plan: Atopic dermatitis Patient without a h/o eczema in the past, but does note a h/o seasonal allergies. Symptoms and exam are  consistent with atopic dermatitis. No new exposures. No evidence of superimposed bacterial infection. No systemic symptoms. - Triamcinolone cream to the affected area- dicussed hypopigmentation and skin thinning - Discussed use of emollients as well.  - Hydroxyzine for pruritus- discussed drowsiness  - RTC precautions discussed.    No orders of the defined types were placed in this encounter.    Meds ordered this encounter  Medications  . triamcinolone cream (KENALOG) 0.1 %    Sig: Apply 1 application topically 2 (two) times daily.    Dispense:  45 g    Refill:  0  . hydrOXYzine (ATARAX/VISTARIL) 10 MG tablet    Sig: Take 1 tablet (10 mg total) by mouth 3 (three) times daily as needed for itching.    Dispense:  30 tablet    Refill:  [place] PGY-2, [place]

## 2014-11-10 NOTE — Patient Instructions (Signed)
I have prescribed a steroid cream for the rash- it should help with the inflammation (and in turn help with the itching). I have prescribed hydroxyzine. It will help with the itching but will make you sleepy so only take it when you don't need to be working/driving.  Eczema Eczema, also called atopic dermatitis, is a skin disorder that causes inflammation of the skin. It causes a red rash and dry, scaly skin. The skin becomes very itchy. Eczema is generally worse during the cooler winter months and often improves with the warmth of summer. Eczema usually starts showing signs in infancy. Some children outgrow eczema, but it may last through adulthood.  CAUSES  The exact cause of eczema is not known, but it appears to run in families. People with eczema often have a family history of eczema, allergies, asthma, or hay fever. Eczema is not contagious. Flare-ups of the condition may be caused by:   Contact with something you are sensitive or allergic to.   Stress. SIGNS AND SYMPTOMS  Dry, scaly skin.   Red, itchy rash.   Itchiness. This may occur before the skin rash and may be very intense.  DIAGNOSIS  The diagnosis of eczema is usually made based on symptoms and medical history. TREATMENT  Eczema cannot be cured, but symptoms usually can be controlled with treatment and other strategies. A treatment plan might include:  Controlling the itching and scratching.   Use over-the-counter antihistamines as directed for itching. This is especially useful at night when the itching tends to be worse.   Use over-the-counter steroid creams as directed for itching.   Avoid scratching. Scratching makes the rash and itching worse. It may also result in a skin infection (impetigo) due to a break in the skin caused by scratching.   Keeping the skin well moisturized with creams every day. This will seal in moisture and help prevent dryness. Lotions that contain alcohol and water should be avoided  because they can dry the skin.   Limiting exposure to things that you are sensitive or allergic to (allergens).   Recognizing situations that cause stress.   Developing a plan to manage stress.  HOME CARE INSTRUCTIONS   Only take over-the-counter or prescription medicines as directed by your health care provider.   Do not use anything on the skin without checking with your health care provider.   Keep baths or showers short (5 minutes) in warm (not hot) water. Use mild cleansers for bathing. These should be unscented. You may add nonperfumed bath oil to the bath water. It is best to avoid soap and bubble bath.   Immediately after a bath or shower, when the skin is still damp, apply a moisturizing ointment to the entire body. This ointment should be a petroleum ointment. This will seal in moisture and help prevent dryness. The thicker the ointment, the better. These should be unscented.   Keep fingernails cut short. Children with eczema may need to wear soft gloves or mittens at night after applying an ointment.   Dress in clothes made of cotton or cotton blends. Dress lightly, because heat increases itching.   A child with eczema should stay away from anyone with fever blisters or cold sores. The virus that causes fever blisters (herpes simplex) can cause a serious skin infection in children with eczema. SEEK MEDICAL CARE IF:   Your itching interferes with sleep.   Your rash gets worse or is not better within 1 week after starting treatment.   You  see pus or soft yellow scabs in the rash area.   You have a fever.   You have a rash flare-up after contact with someone who has fever blisters.  Document Released: 03/02/2000 Document Revised: 12/24/2012 Document Reviewed: 10/06/2012 [facility] Patient Information 2015 [place], [place]. This information is not intended to replace advice given to you by your health care provider. Make sure you discuss any questions you have  with your health care provider.

## 2014-11-11 ENCOUNTER — Ambulatory Visit: Payer: Self-pay

## 2014-11-12 ENCOUNTER — Encounter: Payer: Self-pay | Admitting: Family Medicine

## 2014-11-12 DIAGNOSIS — L209 Atopic dermatitis, unspecified: Secondary | ICD-10-CM | POA: Insufficient documentation

## 2014-11-12 NOTE — Assessment & Plan Note (Signed)
Patient without a h/o eczema in the past, but does note a h/o seasonal allergies. Symptoms and exam are consistent with atopic dermatitis. No new exposures. No evidence of superimposed bacterial infection. No systemic symptoms. - Triamcinolone cream to the affected area- dicussed hypopigmentation and skin thinning - Discussed use of emollients as well.  - Hydroxyzine for pruritus- discussed drowsiness  - RTC precautions discussed.

## 2014-11-29 ENCOUNTER — Telehealth: Payer: Self-pay | Admitting: Obstetrics and Gynecology

## 2014-11-29 NOTE — Telephone Encounter (Signed)
Left message to call Jersey Ravenscroft at [phone]. 

## 2014-11-29 NOTE — Telephone Encounter (Signed)
Patient says she is having bladder leakage and would like to come in to see Dr. Pam Drown # to reach [phone] (work)

## 2014-11-29 NOTE — Telephone Encounter (Signed)
Spoke with patient. Patient states that she is still experiencing urinary incontinence and feels it is getting worse. Would like to come in to speak with Dr.Silva regarding further options. Appointment scheduled for 9/16 at 1:15 pm with Dr.Silva. Patient is agreeable to date and time.  Routing to provider for final review. Patient agreeable to disposition. Will close encounter.

## 2014-12-03 ENCOUNTER — Encounter: Payer: Self-pay | Admitting: Obstetrics and Gynecology

## 2014-12-03 ENCOUNTER — Ambulatory Visit
Admission: RE | Admit: 2014-12-03 | Discharge: 2014-12-03 | Disposition: A | Discharge status: Home / Self Care | Payer: 59 | Source: Ambulatory Visit

## 2014-12-03 ENCOUNTER — Ambulatory Visit (INDEPENDENT_AMBULATORY_CARE_PROVIDER_SITE_OTHER): Payer: 59 | Admitting: Obstetrics and Gynecology

## 2014-12-03 VITALS — HR 80 | Ht 64.0 in | Wt 204.8 lb

## 2014-12-03 DIAGNOSIS — R350 Frequency of micturition: Secondary | ICD-10-CM

## 2014-12-03 DIAGNOSIS — N3946 Mixed incontinence: Secondary | ICD-10-CM

## 2014-12-03 DIAGNOSIS — Z1231 Encounter for screening mammogram for malignant neoplasm of breast: Secondary | ICD-10-CM

## 2014-12-03 MED ORDER — TOLTERODINE TARTRATE ER 4 MG PO CP24: 4.0000 mg | ORAL_CAPSULE | Freq: Every day | ORAL | Status: DC

## 2014-12-03 NOTE — Progress Notes (Signed)
Patient ID: [name], female   DOB: March 16, 1961, 54 y.o.   MRN: [id number] GYNECOLOGY  VISIT   HPI: 54 y.o.   Married  [place] American  female   [id number] with No LMP recorded. Patient has had a hysterectomy.   here for evaluation of stress incontinence.  Patient states she wears a pad all the time.  "I talked to my husband.  Just fix it."  Urinary leakage without warning.  Had an accident in public. Leaks with coughing, laughing, and sneezing consisently. Wearing a pad. Voiding often.  DF - every 1/2 hour.  NF - twice a night.   No consistent caffeine use.   No history of glaucoma or arrhythmia.   Did not see pelvic floor therapy yet.   GYNECOLOGIC HISTORY: No LMP recorded. Patient has had a hysterectomy. Contraception:Hysterectomy Menopausal hormone therapy: Estrace 1mg  Last mammogram: 12-03-14 pend;10-21-13 [facility]C/Neg:[facility]. Last pap smear: 10-14-13 Neg:Neg HR HPV        OB History    Gravida Para Term Preterm AB TAB SAB Ectopic Multiple Living   3 2 2  1     2          Patient Active Problem List   Diagnosis Date Noted  . Atopic dermatitis 11/12/2014  . Left-sided face pain 10/08/2014  . Sinusitis, acute maxillary 09/14/2014  . Acute pharyngitis 08/13/2014  . Urine, incontinence, stress female 06/18/2014  . Left foot pain 06/18/2014  . Memory loss 01/22/2014  . Depression with anxiety 01/22/2014  . VAIN I (vaginal intraepithelial neoplasia grade I)   . Hyperlipemia 10/22/2013  . Wears glasses   . Arthritis   . History of measles, mumps, or rubella   . Back pain   . Sleep deprivation   . Fatigue   . Blurred vision   . Menopausal symptoms 10/05/2011  . Prediabetes 10/04/2008  . MIGRAINE HEADACHE 10/04/2008  . CHEST PAIN 10/04/2008    Past Medical History  Diagnosis Date  . Borderline diabetic   . Weight gain   . Sleep deprivation     loss of sleep  . Fatigue   . Shortness of breath     with pain   . Nausea   . Blurred vision    . Wears glasses   . Arthritis   . Migraine headache   . History of measles, mumps, or rubella   . H/O varicella   . Hx of ovarian cyst   . Yeast infection   . H/O bacterial infection   . Back pain   . Depression   . 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 Anxiety   . Diabetes mellitus without complication     Past Surgical History  Procedure Laterality Date  . Abdominal hysterectomy    . Oophorectomy  2009  . Cholecystectomy  11/13/10  . Dilation and curettage of uterus    . Tubal ligation    . Dg gall bladder      Current Outpatient Prescriptions  Medication Sig Dispense Refill  . estradiol (ESTRACE) 1 MG tablet Take 1 tablet (1 mg total) by mouth 2 (two) times daily. 60 tablet 1  . fluticasone (FLONASE) 50 MCG/ACT nasal spray Place 2 sprays into both nostrils daily. (Patient not taking: Reported on 10/22/2014) 16 g 6  . HYDROcodone-acetaminophen (NORCO) 7.5-325 MG per tablet Take 1 tablet by mouth daily as needed for moderate pain.    . meloxicam (  MOBIC) 7.5 MG tablet Take 7.5 mg by mouth 2 (two) times daily.     . metFORMIN (GLUCOPHAGE) 500 MG tablet TAKE 1 TABLET (500 MG TOTAL) BY MOUTH 2 (TWO) TIMES DAILY WITH A MEAL. 60 tablet 2  . pravastatin (PRAVACHOL) 40 MG tablet TAKE 1 TABLET BY MOUTH DAILY. 30 tablet 2   No current facility-administered medications for this visit.     ALLERGIES: Sulfa antibiotics  Family History  Problem Relation Age of Onset  . Hypertension Mother   . Hypotension Mother   . Anemia Mother     low iron  . Heart disease Sister   . Sickle cell trait Other   . Alcohol abuse Brother   . Alcohol abuse Brother   . Drug abuse Brother     Social History   Social History  . Marital Status: Married    Spouse Name: N/A  . Number of Children: N/A  . Years of Education: N/A   Occupational History  . Not on file.   Social History Main Topics  . Smoking status: Former Smoker --  0.50 packs/day    Types: Cigarettes  . Smokeless tobacco: Never Used  . Alcohol Use: 0.0 oz/week    0 Standard drinks or equivalent per week  . Drug Use: No  . Sexual Activity:    Partners: Male    Birth Control/ Protection: Surgical     Comment: Hysterectomy   Other Topics Concern  . Not on file   Social History Narrative   Patient lives at home with her husband [name]). She works for [employer] and she has her associates degree. Two children.    ROS:  Pertinent items are noted in HPI.  PHYSICAL EXAMINATION:    Pulse 80  Ht 5\' 4"  (1.626 m)  Wt 204 lb 12.8 oz (92.897 kg)  BMI 35.14 kg/m2    General appearance: alert, cooperative and appears stated age  Pelvic: External genitalia:  no lesions              Urethra:  normal appearing urethra with no masses, tenderness or lesions              Bartholins and Skenes: normal                 Vagina: normal appearing vagina with normal color and discharge, no lesions.  No significant cystocele or rectocele.               Cervix: absent       Bimanual Exam:  Uterus:  absent              Adnexa: no masses.        Chaperone was present for exam.  ASSESSMENT  Mixed incontinence.   PLAN  Counseled regarding mixed incontinence - discussed physical therapy, anticholinergics/antimuscarinics, surgery.  Discussed midurethral sling - risks and benefits.  Discussed permanent material is used and the risks of erosions, exposures and their consequences. Discussed urodynamic testing.  Will hold off on this currently because patient would like to see if she responds well to the medication before considering surgery. Detrol LA 4 mg daily.  #30, RF one.  Discussed sided effects dry mouth, constipation, urinary retention.  Discussed sugar free candy and gum to help with mouth dryness.  Discussed stool softeners.  Follow up in 6 weeks.    An After Visit Summary was printed and given to the patient.  __25____ minutes face to face time of  which over  50% was spent in counseling.

## 2015-01-14 ENCOUNTER — Ambulatory Visit: Payer: 59 | Admitting: Obstetrics and Gynecology

## 2015-01-24 ENCOUNTER — Encounter: Payer: Self-pay | Admitting: Family Medicine

## 2015-01-24 ENCOUNTER — Ambulatory Visit (INDEPENDENT_AMBULATORY_CARE_PROVIDER_SITE_OTHER): Payer: 59 | Admitting: Family Medicine

## 2015-01-24 VITALS — BP 126/70 | HR 85 | Temp 98.5°F | Wt 203.8 lb

## 2015-01-24 DIAGNOSIS — J069 Acute upper respiratory infection, unspecified: Secondary | ICD-10-CM | POA: Diagnosis not present

## 2015-01-24 MED ORDER — GUAIFENESIN-CODEINE 100-10 MG/5ML PO SOLN: 5.0000 mL | Freq: Four times a day (QID) | ORAL | Status: DC | PRN

## 2015-01-24 NOTE — Patient Instructions (Signed)
Upper Respiratory Infection, Adult Most upper respiratory infections (URIs) are a viral infection of the air passages leading to the lungs. A URI affects the nose, throat, and upper air passages. The most common type of URI is nasopharyngitis and is typically referred to as "the common cold." URIs run their course and usually go away on their own. Most of the time, a URI does not require medical attention, but sometimes a bacterial infection in the upper airways can follow a viral infection. This is called a secondary infection. Sinus and middle ear infections are common types of secondary upper respiratory infections. Bacterial pneumonia can also complicate a URI. A URI can worsen asthma and chronic obstructive pulmonary disease (COPD). Sometimes, these complications can require emergency medical care and may be life threatening.  CAUSES Almost all URIs are caused by viruses. A virus is a type of germ and can spread from one person to another.  RISKS FACTORS You may be at risk for a URI if:   You smoke.   You have chronic heart or lung disease.  You have a weakened defense (immune) system.   You are very young or very old.   You have nasal allergies or asthma.  You work in crowded or poorly ventilated areas.  You work in health care facilities or schools. SIGNS AND SYMPTOMS  Symptoms typically develop 2-3 days after you come in contact with a cold virus. Most viral URIs last 7-10 days. However, viral URIs from the influenza virus (flu virus) can last 14-18 days and are typically more severe. Symptoms may include:   Runny or stuffy (congested) nose.   Sneezing.   Cough.   Sore throat.   Headache.   Fatigue.   Fever.   Loss of appetite.   Pain in your forehead, behind your eyes, and over your cheekbones (sinus pain).  Muscle aches.  DIAGNOSIS  Your health care provider may diagnose a URI by:  Physical exam.  Tests to check that your symptoms are not due to  another condition such as:  Strep throat.  Sinusitis.  Pneumonia.  Asthma. TREATMENT  A URI goes away on its own with time. It cannot be cured with medicines, but medicines may be prescribed or recommended to relieve symptoms. Medicines may help:  Reduce your fever.  Reduce your cough.  Relieve nasal congestion. HOME CARE INSTRUCTIONS   Take medicines only as directed by your health care provider.   Gargle warm saltwater or take cough drops to comfort your throat as directed by your health care provider.  Use a warm mist humidifier or inhale steam from a shower to increase air moisture. This may make it easier to breathe.  Drink enough fluid to keep your urine clear or pale yellow.   Eat soups and other clear broths and maintain good nutrition.   Rest as needed.   Return to work when your temperature has returned to normal or as your health care provider advises. You may need to stay home longer to avoid infecting others. You can also use a face mask and careful hand washing to prevent spread of the virus.  Increase the usage of your inhaler if you have asthma.   Do not use any tobacco products, including cigarettes, chewing tobacco, or electronic cigarettes. If you need help quitting, ask your health care provider. PREVENTION  The best way to protect yourself from getting a cold is to practice good hygiene.   Avoid oral or hand contact with people with cold   symptoms.   Wash your hands often if contact occurs.  There is no clear evidence that vitamin C, vitamin E, echinacea, or exercise reduces the chance of developing a cold. However, it is always recommended to get plenty of rest, exercise, and practice good nutrition.  SEEK MEDICAL CARE IF:   You are getting worse rather than better.   Your symptoms are not controlled by medicine.   You have chills.  You have worsening shortness of breath.  You have brown or red mucus.  You have yellow or brown nasal  discharge.  You have pain in your face, especially when you bend forward.  You have a fever.  You have swollen neck glands.  You have pain while swallowing.  You have white areas in the back of your throat. SEEK IMMEDIATE MEDICAL CARE IF:   You have severe or persistent:  Headache.  Ear pain.  Sinus pain.  Chest pain.  You have chronic lung disease and any of the following:  Wheezing.  Prolonged cough.  Coughing up blood.  A change in your usual mucus.  You have a stiff neck.  You have changes in your:  Vision.  Hearing.  Thinking.  Mood. MAKE SURE YOU:   Understand these instructions.  Will watch your condition.  Will get help right away if you are not doing well or get worse.   This information is not intended to replace advice given to you by your health care provider. Make sure you discuss any questions you have with your health care provider.   Document Released: 08/29/2000 Document Revised: 07/20/2014 Document Reviewed: 06/10/2013 Elsevier Interactive Patient Education 2016 Elsevier Inc.  

## 2015-01-24 NOTE — Progress Notes (Signed)
    Subjective: CC: concern for flu HPI: Patient is a 54 y.o. female presenting to clinic today for same day appt. Concerns today include:  Patient reports cough, chills, sinus headache that started 3 days ago.  Symptom onset was gradual.  She has used Tylenol severe cold and flu with little relief.  She notes that she has not checked for fevers so she is unsure if she has been febrile.  Possible sick contacts at work.  She denies vomiting, diarrhea.  She reports poor appetite but has been hydrating well.  She also notes L sided ear pain that radiates to neck.  She also notes a mid back pain.  She notes that coughing has been so bad that she almost vomits.  Social History Reviewed: non smoker. FamHx and MedHx updated.  Please see EMR. Health Maintenance: UTD  ROS: Per HPI  Objective: Office vital signs reviewed. BP 126/70 mmHg  Pulse 85  Temp(Src) 98.5 F (36.9 C) (Oral)  Wt 203 lb 12.8 oz (92.443 kg)  SpO2 96%  Physical Examination:  General: Awake, alert, obese, well appearing female, No acute distress HEENT: Normal    Neck: No masses palpated. No lymphadenopathy    Ears: Tympanic membranes intact, normal light reflex, no erythema, no bulging    Eyes: PERRLA, EOMI    Nose: nasal turbinates moist, boggy    Throat: moist mucus membranes, no erythema, no tonsillar exudate Cardio: regular rate and rhythm, S1S2 heard, no murmurs appreciated Pulm: clear to auscultation bilaterally, no wheezes, rhonchi or rales, normal work of breathing Skin: dry, intact, no rashes or lesions   Assessment/ Plan: 54 y.o. female   1. Acute upper respiratory infection, appears viral.  No evidence of secondary bacterial infection.  Low suspicion for influenza, given gradual onset and lack of fever.  In addition, patient received a flu shot this year. - Supportive care - guaiFENesin-codeine 100-10 MG/5ML syrup; Take 5-10 mLs by mouth every 6 (six) hours as needed for cough.  Dispense: 120 mL; Refill:  0 - Continue Motrin/Tylenol as needed headache, sore throat - Work note provided through Thursday - Return precautions reviewed - Follow up as needed    Janora Norlander, DO PGY-2, [place]

## 2015-01-25 ENCOUNTER — Telehealth: Payer: Self-pay | Admitting: Family Medicine

## 2015-01-25 NOTE — Telephone Encounter (Signed)
Will forward to Dr. Lajuana Ripple who saw patient for this appt.  She may need to give medication more time but will check with provider. Nyja Westbrook,CMA

## 2015-01-25 NOTE — Telephone Encounter (Signed)
After 3 doses of cough medicine, it is not working, she took double last night and it still didn't work.  Please advise

## 2015-01-26 ENCOUNTER — Ambulatory Visit: Payer: 59 | Admitting: Obstetrics and Gynecology

## 2015-01-26 NOTE — Telephone Encounter (Signed)
LM for patient to call back to clinic and get message from MD regarding her cough.  Will forward to white team so they are aware. Emanuela Runnion,CMA

## 2015-01-26 NOTE — Telephone Encounter (Signed)
If cough is persistent, patient should be seen if febrile.  Otherwise, would give it time.  Patient seemed to have a viral URI.  Cough can be persistent for up to 6 months after illness.  If febrile, would be concerned for superimposed bacterial pneumonia, which would need an office visit and CXR.  Please advise patient.

## 2015-01-28 ENCOUNTER — Encounter: Payer: Self-pay | Admitting: Family Medicine

## 2015-01-28 ENCOUNTER — Ambulatory Visit (INDEPENDENT_AMBULATORY_CARE_PROVIDER_SITE_OTHER): Payer: 59 | Admitting: Family Medicine

## 2015-01-28 VITALS — BP 122/85 | HR 100 | Temp 98.5°F | Ht 64.0 in | Wt 206.8 lb

## 2015-01-28 DIAGNOSIS — K219 Gastro-esophageal reflux disease without esophagitis: Secondary | ICD-10-CM | POA: Diagnosis not present

## 2015-01-28 DIAGNOSIS — R059 Cough, unspecified: Secondary | ICD-10-CM

## 2015-01-28 DIAGNOSIS — R05 Cough: Secondary | ICD-10-CM

## 2015-01-28 MED ORDER — BENZONATATE 200 MG PO CAPS: 200.0000 mg | ORAL_CAPSULE | Freq: Two times a day (BID) | ORAL | Status: DC | PRN

## 2015-01-28 MED ORDER — RANITIDINE HCL 150 MG PO TABS: 150.0000 mg | ORAL_TABLET | Freq: Two times a day (BID) | ORAL | Status: DC

## 2015-01-28 NOTE — Progress Notes (Signed)
    Subjective: CC: cough HPI: Patient is a 54 y.o. female presenting to clinic today for follow up. Concerns today include:  1. Cough Patient reports that she continues to have a non productive cough throughout the night.  She has been taking the Robitussin AC each night with little relief.  She denies fevers, shortness of breath, wheeze, chest pain, rhinorrhea.  She reports intermittent hot flashes.  Upon further questioning, patient endorses heartburn.  She does not take any OTC medication for this.  She has been on a PPI in the past when she had a cholecystectomy.  She has not used PPI in a few years.  She endorses bloating, constipation.  Social History Reviewed: former smoker. FamHx and MedHx updated.  Please see EMR.  ROS: Per HPI  Objective: Office vital signs reviewed. BP 122/85 mmHg  Pulse 100  Temp(Src) 98.5 F (36.9 C) (Oral)  Ht 5\' 4"  (1.626 m)  Wt 206 lb 12.8 oz (93.804 kg)  BMI 35.48 kg/m2  Physical Examination:  General: Awake, alert, obese, No acute distress HEENT: Normal, MMM Cardio: regular rate and rhythm, S1S2 heard, no murmurs appreciated Pulm: clear to auscultation bilaterally, no wheezes, rhonchi or rales, normal work of breathing GI: soft, non-tender, non-distended, bowel sounds present x4, no epigastric tenderness  Assessment/ Plan: 54 y.o. female   1. Cough. ?post viral cough vs GERD associated cough.  Could consider CXR in the setting of her being a former smoker.  Cough non productive.  Patient afebrile.  No acute findings on lung exam.  Do not suspect pneumonia at this time. - Discussed with patient that if this is a post viral cough, it can last up to 6 months. - benzonatate (TESSALON) 200 MG capsule; Take 1 capsule (200 mg total) by mouth 2 (two) times daily as needed for cough.  Dispense: 20 capsule; Refill: 0 - ranitidine (ZANTAC) 150 MG tablet; Take 1 tablet (150 mg total) by mouth 2 (two) times daily.  Dispense: 60 tablet; Refill: 1 -  Recommend that patient start with Zantac for next couple of days.  If no improvement in cough start Tessalon - Continue Robitussin AC AS NEEDED cough. - Return precautions reviewed. - Follow up with PCP  2. Gastroesophageal reflux disease without esophagitis.  Suspect that this is why patient is having cough, esp in the absence of fever.  No evidence of ulcer on exam.  Could consider PPI if does not achieve optimal symptom management with Zantac. - ranitidine (ZANTAC) 150 MG tablet; Take 1 tablet (150 mg total) by mouth 2 (two) times daily.  Dispense: 60 tablet; Refill: 1  Patient also asked for prescription for Phentermine at the end of the appointment.  Recommended that she follow up with PCP for this, as it was not appropriate for me to fill this medication, esp in the setting of her being seen for cough.   Janora Norlander, DO PGY-2, [facility]

## 2015-01-28 NOTE — Patient Instructions (Signed)
If you get a fever, coughing worsens or cough becomes productive, return.  You may need antibiotics or a chest xray.  I have sent in [facility].  You can take this along with your cough syrup.  Again, after a viral infection, cough can persist for 6 months.  Cough, Adult Coughing is a reflex that clears your throat and your airways. Coughing helps to heal and protect your lungs. It is normal to cough occasionally, but a cough that happens with other symptoms or lasts a long time may be a sign of a condition that needs treatment. A cough may last only 2-3 weeks (acute), or it may last longer than 8 weeks (chronic). CAUSES Coughing is commonly caused by:  Breathing in substances that irritate your lungs.  A viral or bacterial respiratory infection.  Allergies.  Asthma.  Postnasal drip.  Smoking.  Acid backing up from the stomach into the esophagus (gastroesophageal reflux).  Certain medicines.  Chronic lung problems, including COPD (or rarely, lung cancer).  Other medical conditions such as heart failure. HOME CARE INSTRUCTIONS  Pay attention to any changes in your symptoms. Take these actions to help with your discomfort:  Take medicines only as told by your health care provider.  If you were prescribed an antibiotic medicine, take it as told by your health care provider. Do not stop taking the antibiotic even if you start to feel better.  Talk with your health care provider before you take a cough suppressant medicine.  Drink enough fluid to keep your urine clear or pale yellow.  If the air is dry, use a cold steam vaporizer or humidifier in your bedroom or your home to help loosen secretions.  Avoid anything that causes you to cough at work or at home.  If your cough is worse at night, try sleeping in a semi-upright position.  Avoid cigarette smoke. If you smoke, quit smoking. If you need help quitting, ask your health care provider.  Avoid caffeine.  Avoid  alcohol.  Rest as needed. SEEK MEDICAL CARE IF:   You have new symptoms.  You cough up pus.  Your cough does not get better after 2-3 weeks, or your cough gets worse.  You cannot control your cough with suppressant medicines and you are losing sleep.  You develop pain that is getting worse or pain that is not controlled with pain medicines.  You have a fever.  You have unexplained weight loss.  You have night sweats. SEEK IMMEDIATE MEDICAL CARE IF:  You cough up blood.  You have difficulty breathing.  Your heartbeat is very fast.   This information is not intended to replace advice given to you by your health care provider. Make sure you discuss any questions you have with your health care provider.   Document Released: 09/01/2010 Document Revised: 11/24/2014 Document Reviewed: 05/12/2014 Elsevier Interactive Patient Education [employer].

## 2015-02-03 ENCOUNTER — Ambulatory Visit (INDEPENDENT_AMBULATORY_CARE_PROVIDER_SITE_OTHER): Payer: 59 | Admitting: Internal Medicine

## 2015-02-03 ENCOUNTER — Encounter: Payer: Self-pay | Admitting: Internal Medicine

## 2015-02-03 ENCOUNTER — Other Ambulatory Visit: Payer: Self-pay | Admitting: *Deleted

## 2015-02-03 VITALS — BP 112/68 | HR 79 | Temp 98.4°F | Resp 12 | Wt 209.6 lb

## 2015-02-03 DIAGNOSIS — R7303 Prediabetes: Secondary | ICD-10-CM | POA: Diagnosis not present

## 2015-02-03 LAB — HEPATIC FUNCTION PANEL
ALT: 11 U/L (ref 0–35)
AST: 14 U/L (ref 0–37)
Albumin: 3.9 g/dL (ref 3.5–5.2)
Alkaline Phosphatase: 87 U/L (ref 39–117)
Bilirubin, Direct: 0 mg/dL (ref 0.0–0.3)
Total Bilirubin: 0.2 mg/dL (ref 0.2–1.2)
Total Protein: 7.4 g/dL (ref 6.0–8.3)

## 2015-02-03 LAB — LIPID PANEL
Cholesterol: 266 mg/dL — ABNORMAL HIGH (ref 0–200)
HDL: 44.4 mg/dL (ref >39.00)
LDL Cholesterol: 196 mg/dL — ABNORMAL HIGH (ref 0–99)
NonHDL: 221.62
Total CHOL/HDL Ratio: 6
Triglycerides: 128 mg/dL (ref 0.0–149.0)
VLDL: 25.6 mg/dL (ref 0.0–40.0)

## 2015-02-03 LAB — HEMOGLOBIN A1C: Hgb A1c MFr Bld: 6.5 % (ref 4.6–6.5)

## 2015-02-03 MED ORDER — SITAGLIPTIN PHOSPHATE 100 MG PO TABS: 100.0000 mg | ORAL_TABLET | Freq: Every day | ORAL | Status: DC

## 2015-02-03 MED ORDER — METFORMIN HCL ER 500 MG PO TB24: 500.0000 mg | ORAL_TABLET | Freq: Two times a day (BID) | ORAL | Status: DC

## 2015-02-03 MED ORDER — PITAVASTATIN CALCIUM 2 MG PO TABS: ORAL_TABLET | ORAL | Status: DC

## 2015-02-03 MED ORDER — GLUCOSE BLOOD VI STRP: ORAL_STRIP | Status: DC

## 2015-02-03 NOTE — Patient Instructions (Signed)
Please stop Regular Metformin and start Metformin ER 500 mg 2x a day. Increase to 3x a day if you can.  Start Januvia 100 mg in am, before b'fast.  Please return in 3 months with your sugar log.

## 2015-02-03 NOTE — Progress Notes (Signed)
Patient ID: [name], female   DOB: 02-03-1961, 54 y.o.   MRN: [id number]  HPI: [name] is a 54 y.o.-year-old female, initially referred by her PCP, Dr. Judeth Horn, for management of prediabetes, dx early 2000s, controlled, without complications and hyperlipidemia. Last visit 4 mo ago.  Last hemoglobin A1c was: Lab Results  Component Value Date   HGBA1C 6.3 10/08/2014   HGBA1C 6.6* 06/07/2014   HGBA1C 6.3 01/22/2014  She had several steroid inj in last year- last in 04/2014.   She is on: - Metformin 500 mg 1x a day - She initially had diarrhea with it, now nausea >> takes it only once a day  Pt checks sugars 1x a day >> HIGHER: - am: 88-109 >> 85-100, 115 x1 >> 70-90 >> 165-180 - lunch: 95-101 >> n/c - 2h after lunch: 130-135 >> 110-125 >> 190 - 2h after dinner: 165-175 (when she has a larger dinner or dessert) >> 140's >> >200 No lows. Lowest sugar was 97 >> 80 >> 75 >> 135; ? she has hypoglycemia awareness. Highest: 190.  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   - no CKD, last BUN/creatinine:  Lab Results  Component Value Date   BUN 14 09/16/2014   CREATININE 1.04 09/16/2014   - last eye exam was in 04/2014. No DR. - no numbness and tingling in feet.  HL: Reviewed last 3 sets of lipids - much improved at last check, after we started Pravastatin 40 mg.  Lab Results  Component Value Date   CHOL 197 02/05/2014   CHOL 234* 10/14/2013   CHOL 240* 10/05/2011   Lab Results  Component Value Date   HDL 45 02/05/2014   HDL 51 10/14/2013   HDL 49 10/05/2011   Lab Results  Component Value Date   LDLCALC 117* 02/05/2014   LDLCALC 156* 10/14/2013   LDLCALC 163* 10/05/2011   Lab Results  Component Value Date   TRIG 177* 02/05/2014   TRIG 136 10/14/2013   TRIG 142 10/05/2011   Lab Results  Component Value Date   CHOLHDL 4.4 02/05/2014   CHOLHDL 4.6 10/14/2013   CHOLHDL 4.9 10/05/2011   Her last LFTs are  normal: Lab Results  Component Value Date   ALT 8 02/05/2014   AST 13 02/05/2014   ALKPHOS 83 02/05/2014   BILITOT <0.2 02/05/2014   Has FH of HL in sister.  I reviewed pt's medications, allergies, PMH, social hx, family hx, and changes were documented in the history of present illness. Otherwise, unchanged from my initial visit note.  ROS: Constitutional: + weight gain, + fatigue, no subjective hyperthermia, + poor sleep Eyes: + blurry vision, no xerophthalmia ENT: + sore throat, no nodules palpated in throat, no dysphagia/odynophagia, + hoarseness Cardiovascular: no CP/SOB/palpitations/leg swelling Respiratory: + cough/no SOB/wheezing Gastrointestinal: + N/no V/no D/+ C/+ heartburn Musculoskeletal: no muscle/+ joint aches Skin: no rashes Neurological: no tremors/numbness/tingling/dizziness, + HA  I reviewed pt's medications, allergies, PMH, social hx, family hx, and changes were documented in the history of present illness. Otherwise, unchanged from my initial visit note.  PE: BP 112/68 mmHg  Pulse 79  Temp(Src) 98.4 F (36.9 C) (Oral)  Resp 12  Wt 209 lb 9.6 oz (95.074 kg)  SpO2 98% Body mass index is 35.96 kg/(m^2). Wt Readings from Last 3 Encounters:  02/03/15 209 lb 9.6 oz (95.074 kg)  01/28/15 206 lb 12.8 oz (93.804 kg)  01/24/15 203 lb 12.8 oz (92.443  kg)   Constitutional: overweight, in NAD Eyes: PERRLA, EOMI, no exophthalmos ENT: moist mucous membranes, no thyromegaly, no cervical lymphadenopathy Cardiovascular: RRR, No MRG Respiratory: CTA B Gastrointestinal: abdomen soft, NT, ND, BS+ Musculoskeletal: no deformities, strength intact in all 4 Skin: moist, warm, no rashes Neurological: no tremor with outstretched hands, DTR normal in all 4  ASSESSMENT: 1. Prediabetes  2. HL  PLAN:  1. Patient has prediabetes, for which she is on metformin 500 mg 1x a day. Sugars are much worse >> will switch to Metformin XR as she got nausea with regular metformin. Will  also add a DPP4 inh.  - I suggested to: Patient Instructions  Please stop Regular Metformin and start Metformin ER 500 mg 2x a day. Increase to 3x a day if you can.  Start Januvia 100 mg in am, before b'fast.  Please return in 3 months with your sugar log.   - continue checking sugars at different times of the day - check 1-2 day, rotating checks - advised for yearly eye exams - she is UTD - check HbA1c today, but I think she crossed over to DM2 - Return to clinic in 3 mo with sugar log  2. HL - Pt with high LDL, also prediabetic, obese - Lipids improved with Pravachol 40 mg at night >> she tolerates the medication well >> however, will switch to Livalo which has a better influence on her sugars - will check LFTs and Lipids today - discuss to pay more attention to her diet  Orders Placed This Encounter  Procedures  . Lipid Panel  . Hepatic function panel  . HgB A1c   Component     Latest Ref Rng 02/03/2015  Cholesterol     0 - 200 mg/dL 266 (H)  Triglycerides     0.0 - 149.0 mg/dL 128.0  HDL Cholesterol     >39.00 mg/dL 44.40  VLDL     0.0 - 40.0 mg/dL 25.6  LDL (calc)     0 - 99 mg/dL 196 (H)  Total CHOL/HDL Ratio      6  NonHDL      221.62  Total Bilirubin     0.2 - 1.2 mg/dL 0.2  Bilirubin, Direct     0.0 - 0.3 mg/dL 0.0  Alkaline Phosphatase     39 - 117 U/L 87  AST     0 - 37 U/L 14  ALT     0 - 35 U/L 11  Total Protein     6.0 - 8.3 g/dL 7.4  Albumin     3.5 - 5.2 g/dL 3.9  Hemoglobin A1C     4.6 - 6.5 % 6.5   HbA1c slightly higher, in the DM range now. See plan to intensify regimen above. LDL much worse - I suspect she was not completely compliant with Pravachol. Will advise compliance with Livalo. LFTs normal.

## 2015-02-04 ENCOUNTER — Ambulatory Visit: Payer: Self-pay | Admitting: Internal Medicine

## 2015-02-04 ENCOUNTER — Encounter: Payer: Self-pay | Admitting: Obstetrics and Gynecology

## 2015-02-04 ENCOUNTER — Ambulatory Visit (INDEPENDENT_AMBULATORY_CARE_PROVIDER_SITE_OTHER): Payer: 59 | Admitting: Obstetrics and Gynecology

## 2015-02-04 VITALS — BP 126/82 | HR 70 | Ht 64.0 in | Wt 207.0 lb

## 2015-02-04 DIAGNOSIS — N3281 Overactive bladder: Secondary | ICD-10-CM

## 2015-02-04 MED ORDER — MIRABEGRON ER 25 MG PO TB24: 25.0000 mg | ORAL_TABLET | Freq: Every day | ORAL | Status: DC

## 2015-02-04 NOTE — Progress Notes (Signed)
Patient ID: [name], female   DOB: 12-Jan-1961, 54 y.o.   MRN: [id number] GYNECOLOGY  VISIT   HPI: 54 y.o.   Married  [place] American  female   [phone] with No LMP recorded. Patient has had a hysterectomy.   here for follow up visit.  Patient states she doesn't feel Detrol has helped with her symptoms. Some constipation.   Urinary leakage without warning.  Leaks with coughing, laughing, and sneezing consisently. Wearing a pad. Voiding often, mostly during the day.  No consistent caffeine use.   No history of glaucoma or arrhythmia.  No hx HTN.  Did not see pelvic floor therapy.  Hemoglobin A1C is 6.5 on 11/17.  GYNECOLOGIC HISTORY: No LMP recorded. Patient has had a hysterectomy. Contraception:Hysterectomy Menopausal hormone therapy: Estrace 1mg  Last mammogram: 12-03-14 [facility]D Density Cat.C/Neg/BiRads[facility]. Last pap smear: 10-14-13 Neg:Neg HR HPV        OB History    Gravida Para Term Preterm AB TAB SAB Ectopic Multiple Living   3 2 2  1     2          Patient Active Problem List   Diagnosis Date Noted  . Atopic dermatitis 11/12/2014  . Left-sided face pain 10/08/2014  . Sinusitis, acute maxillary 09/14/2014  . Acute pharyngitis 08/13/2014  . Urine, incontinence, stress female 06/18/2014  . Left foot pain 06/18/2014  . Memory loss 01/22/2014  . Depression with anxiety 01/22/2014  . VAIN I (vaginal intraepithelial neoplasia grade I)   . Hyperlipidemia 10/22/2013  . Wears glasses   . Arthritis   . History of measles, mumps, or rubella   . Back pain   . Sleep deprivation   . Fatigue   . Blurred vision   . Menopausal symptoms 10/05/2011  . Prediabetes 10/04/2008  . MIGRAINE HEADACHE 10/04/2008  . CHEST PAIN 10/04/2008    Past Medical History  Diagnosis Date  . Borderline diabetic   . Weight gain   . Sleep deprivation     loss of sleep  . Fatigue   . Shortness of breath     with pain   . Nausea   . Blurred vision   . Wears glasses    . Arthritis   . Migraine headache   . History of measles, mumps, or rubella   . H/O varicella   . Hx of ovarian cyst   . Yeast infection   . H/O bacterial infection   . Back pain   . Depression   . 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 Anxiety   . Diabetes mellitus without complication [facility])     Past Surgical History  Procedure Laterality Date  . Abdominal hysterectomy    . Oophorectomy  2009  . Cholecystectomy  11/13/10  . Dilation and curettage of uterus    . Tubal ligation    . Dg gall bladder      Current Outpatient Prescriptions  Medication Sig Dispense Refill  . estradiol (ESTRACE) 1 MG tablet Take 1 tablet (1 mg total) by mouth 2 (two) times daily. 60 tablet 1  . glucose blood (BAYER CONTOUR TEST) test strip Use 2x a day 100 each 11  . HYDROcodone-acetaminophen (NORCO) 7.5-325 MG per tablet Take 1 tablet by mouth daily as needed for moderate pain.    . meloxicam (MOBIC) 7.5 MG tablet Take 7.5 mg by mouth 2 (two) times daily.     . Pitavastatin  Calcium 2 MG TABS Take 1 tablet daily 30 tablet 2  . ranitidine (ZANTAC) 150 MG tablet Take 1 tablet (150 mg total) by mouth 2 (two) times daily. 60 tablet 1  . tolterodine (DETROL LA) 4 MG 24 hr capsule Take 1 capsule (4 mg total) by mouth daily. 30 capsule 1  . metFORMIN (GLUCOPHAGE-XR) 500 MG 24 hr tablet Take 1 tablet (500 mg total) by mouth 2 (two) times daily with a meal. (Patient not taking: Reported on 02/04/2015) 60 tablet 2  . sitaGLIPtin (JANUVIA) 100 MG tablet Take 1 tablet (100 mg total) by mouth daily. (Patient not taking: Reported on 02/04/2015) 30 tablet 2   No current facility-administered medications for this visit.     ALLERGIES: Sulfa antibiotics  Family History  Problem Relation Age of Onset  . Hypertension Mother   . Hypotension Mother   . Anemia Mother     low iron  . Heart disease Sister   . Sickle cell trait Other   . Alcohol abuse  Brother   . Alcohol abuse Brother   . Drug abuse Brother     Social History   Social History  . Marital Status: Married    Spouse Name: N/A  . Number of Children: N/A  . Years of Education: N/A   Occupational History  . Not on file.   Social History Main Topics  . Smoking status: Former Smoker -- 0.50 packs/day    Types: Cigarettes  . Smokeless tobacco: Never Used  . Alcohol Use: 0.0 oz/week    0 Standard drinks or equivalent per week  . Drug Use: No  . Sexual Activity:    Partners: Male    Birth Control/ Protection: Surgical     Comment: Hysterectomy   Other Topics Concern  . Not on file   Social History Narrative   Patient lives at home with her husband [name]). She works for [employer] and she has her associates degree. Two children.    ROS:  Pertinent items are noted in HPI.  PHYSICAL EXAMINATION:    BP 126/82 mmHg  Pulse 70  Ht 5\' 4"  (1.626 m)  Wt 207 lb (93.895 kg)  BMI 35.51 kg/m2    General appearance: alert, cooperative and appears stated age  ASSESSMENT  Mixed incontinence.  Overactive bladder symptoms not improved with Detrol LA.   PLAN  Counseled regarding reduction of bladder irritants, good diabetes control, and physical therapy.   Declined pelvic floor PT.  Will stop Detrol LA and start Myrbetriq 25 mg daily. #30, RF one.  Discussed potential side effects.  Will return for a recheck in about 6 weeks.      An After Visit Summary was printed and given to the patient.  ___15__ minutes face to face time of which over 50% was spent in counseling.

## 2015-02-04 NOTE — Patient Instructions (Signed)
Mirabegron extended-release tablets What is this medicine? MIRABEGRON (MIR a BEG ron) is used to treat overactive bladder. This medicine reduces the amount of bathroom visits. It Bonebrake also help to control wetting accidents. This medicine Trost be used for other purposes; ask your health care provider or pharmacist if you have questions. What should I tell my health care provider before I take this medicine? They need to know if you have any of these conditions: -difficulty passing urine -high blood pressure -kidney disease -liver disease -an unusual or allergic reaction to mirabegron, other medicines, foods, dyes, or preservatives -pregnant or trying to get pregnant -breast-feeding How should I use this medicine? Take this medicine by mouth with a glass of water. Follow the directions on the prescription label. Do not cut, crush or chew this medicine. You can take it with or without food. If it upsets your stomach, take it with food. Take your medicine at regular intervals. Do not take it more often than directed. Do not stop taking except on your doctor's advice. Talk to your pediatrician regarding the use of this medicine in children. Special care Jamerson be needed. Overdosage: If you think you have taken too much of this medicine contact a poison control center or emergency room at once. NOTE: This medicine is only for you. Do not share this medicine with others. What if I miss a dose? If you miss a dose, take it as soon as you can. If it is almost time for your next dose, take only that dose. Do not take double or extra doses. What Coccia interact with this medicine? -certain medicines for bladder problems like fesoterodine, oxybutynin, solifenacin, tolterodine -desipramine -digoxin -flecainide -ketoconazole -MAOIs like Carbex, Eldepryl, Marplan, Nardil, and Parnate -metoprolol -propafenone -thioridazine -warfarin This list [place] not describe all possible interactions. Give your health care  provider a list of all the medicines, herbs, non-prescription drugs, or dietary supplements you use. Also tell them if you smoke, drink alcohol, or use illegal drugs. Some items Miotke interact with your medicine. What should I watch for while using this medicine? It Hendel take 8 weeks to notice the full benefit from this medicine. You Cassar need to limit your intake tea, coffee, caffeinated sodas, and alcohol. These drinks Markert make your symptoms worse. Visit your doctor or health care professional for regular checks on your progress. Check your blood pressure as directed. Ask your doctor or health care professional what your blood pressure should be and when you should contact him or her. What side effects Shadoan I notice from receiving this medicine? Side effects that you should report to your doctor or health care professional as soon as possible: -allergic reactions like skin rash, itching or hives, swelling of the face, lips, or tongue -chest pain or palpitations -severe or sudden headache -high blood pressure -fast, irregular heartbeat -redness, blistering, peeling or loosening of the skin, including inside the mouth -signs of infection like fever or chills; cough; sore throat; pain or difficulty passing urine -trouble passing urine or change in the amount of urine Side effects that usually do not require medical attention (Report these to your doctor or health care professional if they continue or are bothersome.): -constipation -diarrhea -dizziness -dry eyes -joint pain -mild headache -nausea -runny nose This list Mundorf not describe all possible side effects. Call your doctor for medical advice about side effects. You Trim report side effects to FDA at 1-800-FDA-1088. Where should I keep my medicine? Keep out of the reach of children. Store   at room temperature between 15 and 30 degrees C (59 and 86 degrees F). Throw away any unused medicine after the expiration date. NOTE: This sheet is a  summary. It may not cover all possible information. If you have questions about this medicine, talk to your doctor, pharmacist, or health care provider.    2016, Elsevier/Gold Standard. (2014-11-04 10:22:20)

## 2015-02-14 ENCOUNTER — Encounter: Payer: Self-pay | Admitting: Family Medicine

## 2015-02-14 ENCOUNTER — Ambulatory Visit (INDEPENDENT_AMBULATORY_CARE_PROVIDER_SITE_OTHER): Payer: 59 | Admitting: Family Medicine

## 2015-02-14 VITALS — BP 110/70 | HR 115 | Temp 99.3°F | Ht 64.0 in | Wt 204.0 lb

## 2015-02-14 DIAGNOSIS — B9789 Other viral agents as the cause of diseases classified elsewhere: Secondary | ICD-10-CM | POA: Insufficient documentation

## 2015-02-14 DIAGNOSIS — J111 Influenza due to unidentified influenza virus with other respiratory manifestations: Secondary | ICD-10-CM | POA: Diagnosis not present

## 2015-02-14 DIAGNOSIS — J988 Other specified respiratory disorders: Secondary | ICD-10-CM | POA: Insufficient documentation

## 2015-02-14 MED ORDER — ACETAMINOPHEN-CODEINE #3 300-30 MG PO TABS: 1.0000 | ORAL_TABLET | ORAL | Status: DC | PRN

## 2015-02-14 MED ORDER — BENZONATATE 200 MG PO CAPS: 200.0000 mg | ORAL_CAPSULE | Freq: Three times a day (TID) | ORAL | Status: DC | PRN

## 2015-02-14 NOTE — Patient Instructions (Signed)
Influenza, Adult Influenza (flu) is an infection in the mouth, nose, and throat (respiratory tract) caused by a virus. The flu can make you feel very ill. Influenza spreads easily from person to person (contagious).  HOME CARE   Only take medicines as told by your doctor.  Use a cool mist humidifier to make breathing easier.  Get plenty of rest until your fever goes away. This usually takes 3 to 4 days.  Drink enough fluids to keep your pee (urine) clear or pale yellow.  Cover your mouth and nose when you cough or sneeze.  Wash your hands well to avoid spreading the flu.  Stay home from work or school until your fever has been gone for at least 1 full day.  Get a flu shot every year. GET HELP RIGHT AWAY IF:   You have trouble breathing or feel short of breath.  Your skin or nails turn blue.  You have severe neck pain or stiffness.  You have a severe headache, facial pain, or earache.  Your fever gets worse or keeps coming back.  You feel sick to your stomach (nauseous), throw up (vomit), or have watery poop (diarrhea).  You have chest pain.  You have a deep cough that gets worse, or you cough up more thick spit (mucus). MAKE SURE YOU:   Understand these instructions.  Will watch your condition.  Will get help right away if you are not doing well or get worse.   This information is not intended to replace advice given to you by your health care provider. Make sure you discuss any questions you have with your health care provider.   Document Released: 12/13/2007 Document Revised: 03/26/2014 Document Reviewed: 06/04/2011 Elsevier Interactive Patient Education 2016 Elsevier Inc.  

## 2015-02-14 NOTE — Assessment & Plan Note (Addendum)
Patient presentation likely influenza given constellation of fever, cough, and myalgias. Vitals stable. Patient with no signs of dehydration. Doubt pneumonia given clear lung exam and normal O2 saturation. Patient out of window for tamiflu and has no respiratory comorbidities. Will treat conservatively with tylenol #3 and tessalon for myalagias and cough. Patient already has prescription for zofran, instructed her to use as needed for cough. Return precautions reviewed. If not improving in 2-3 days, may need CXR to rule out pneumonia.

## 2015-02-14 NOTE — Progress Notes (Signed)
Subjective:  [name] is a 54 y.o. female who presents to the [facility] today for same day appointment with a chief complaint of cough and fever.   HPI:  Cough / Fever  Patient reports that she has had 3 days of cough, rhinorrhea, fever, and myalgias. Reports home temperature up to 101F and 102F. She was with her grandchildren 3-4 days ago who were also sick. She also reports some mild diarrhea and nausea. She has vomited twice after eating, though otherwise has been able to keep down foods. She took emergen-C which has not helped. Patient lives with her husband who is not sick. She has not tried anything for her fever or cough. No shortness of breath. Patient received her flu shot this year.   ROS: Per HPI  PMH:  The following were reviewed and entered/updated in epic: Past Medical History  Diagnosis Date  . Borderline diabetic   . Weight gain   . Sleep deprivation     loss of sleep  . Fatigue   . Shortness of breath     with pain   . Nausea   . Blurred vision   . Wears glasses   . Arthritis   . Migraine headache   . History of measles, mumps, or rubella   . H/O varicella   . Hx of ovarian cyst   . Yeast infection   . H/O bacterial infection   . Back pain   . Depression   . 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 Anxiety   . Diabetes mellitus without complication [facility])    Patient Active Problem List   Diagnosis Date Noted  . Influenza 02/14/2015  . Atopic dermatitis 11/12/2014  . Left-sided face pain 10/08/2014  . Sinusitis, acute maxillary 09/14/2014  . Acute pharyngitis 08/13/2014  . Urine, incontinence, stress female 06/18/2014  . Left foot pain 06/18/2014  . Memory loss 01/22/2014  . Depression with anxiety 01/22/2014  . VAIN I (vaginal intraepithelial neoplasia grade I)   . Hyperlipidemia 10/22/2013  . Wears glasses   . Arthritis   . History of measles, mumps, or rubella   . Back pain     . Sleep deprivation   . Fatigue   . Blurred vision   . Menopausal symptoms 10/05/2011  . Prediabetes 10/04/2008  . MIGRAINE HEADACHE 10/04/2008  . CHEST PAIN 10/04/2008   Past Surgical History  Procedure Laterality Date  . Abdominal hysterectomy    . Oophorectomy  2009  . Cholecystectomy  11/13/10  . Dilation and curettage of uterus    . Tubal ligation    . Dg gall bladder       Objective:  Physical Exam: BP 110/70 mmHg  Pulse 115  Temp(Src) 99.3 F (37.4 C) (Oral)  Ht 5\' 4"  (1.626 m)  Wt 204 lb (92.534 kg)  BMI 35.00 kg/m2  SpO2 99%  Gen: NAD, resting comfortably HEENT: TMs with clear effusions bilaterally. O/P clear. MMM. No LAD CV: Regular, tachycardic. No murmurs appreciated Lungs: NWOB, CTAB with no crackles, wheezes, or rhonchi GI: Normal bowel sounds present. Soft, Nontender, Nondistended. MSK: no edema, cyanosis, or clubbing noted Skin: warm, dry Neuro: grossly normal, moves all extremities Psych: Normal affect and thought content   Assessment/Plan:  Influenza Patient presentation likely influenza given constellation of fever, cough, and myalgias. Vitals stable. Patient with no signs of dehydration. Doubt pneumonia given clear lung exam and normal O2  saturation. Patient out of window for tamiflu and has no respiratory comorbidities. Will treat conservatively with tylenol #3 and tessalon for myalagias and cough. Patient already has prescription for zofran, instructed her to use as needed for cough. Return precautions reviewed. If not improving in 2-3 days, may need CXR to rule out pneumonia.     Algis Greenhouse. Jerline Pain, [place] Resident PGY-2 02/14/2015 10:04 AM

## 2015-03-25 ENCOUNTER — Ambulatory Visit: Payer: 59 | Admitting: Obstetrics and Gynecology

## 2015-04-01 ENCOUNTER — Encounter: Payer: Self-pay | Admitting: Internal Medicine

## 2015-04-01 ENCOUNTER — Ambulatory Visit: Payer: Self-pay | Admitting: Internal Medicine

## 2015-04-01 ENCOUNTER — Ambulatory Visit (INDEPENDENT_AMBULATORY_CARE_PROVIDER_SITE_OTHER): Payer: 59 | Admitting: Internal Medicine

## 2015-04-01 VITALS — BP 112/64 | HR 75 | Temp 98.2°F | Resp 12 | Wt 206.0 lb

## 2015-04-01 DIAGNOSIS — E1165 Type 2 diabetes mellitus with hyperglycemia: Secondary | ICD-10-CM

## 2015-04-01 DIAGNOSIS — E785 Hyperlipidemia, unspecified: Secondary | ICD-10-CM | POA: Diagnosis not present

## 2015-04-01 MED ORDER — LINAGLIPTIN 5 MG PO TABS: 5.0000 mg | ORAL_TABLET | Freq: Every day | ORAL | Status: DC

## 2015-04-01 MED ORDER — CANAGLIFLOZIN 100 MG PO TABS: 100.0000 mg | ORAL_TABLET | Freq: Every day | ORAL | Status: DC

## 2015-04-01 MED FILL — INVOKANA 100 MG TABLET: 100 | 30 days supply | Qty: 30 | Fill #0

## 2015-04-01 MED FILL — TRADJENTA 5 MG TABLET: 5 | 30 days supply | Qty: 30 | Fill #0

## 2015-04-01 NOTE — Patient Instructions (Addendum)
Please continue: - Metformin ER 500 mg 2x a day  Change Januvia to: - Tradjenta 5 mg in am, before b'fast.  Please add: - Invokana 100 mg in am, before b'fast.  Please return in 1.5 months with your sugar log.

## 2015-04-01 NOTE — Progress Notes (Addendum)
Patient ID: [name], female   DOB: [birth year], 55 y.o.   MRN: [id number]  HPI: [name] is a 55 y.o.-year-old female, returning for f/u for DM2; prediabetes dx early 2000s, controlled, without complications and also hyperlipidemia. Last visit 2 mo ago.  Last hemoglobin A1c was: Lab Results  Component Value Date   HGBA1C 6.5 02/03/2015   HGBA1C 6.3 10/08/2014   HGBA1C 6.6* 06/07/2014  She had several steroid inj in last year- last in 04/2014.   She is on: - Metformin ER 500 mg 2x a day - She initially had diarrhea/nausea with regular metformin - Januvia 100 mg in am, before b'fast. - started 01/2015   Pt checks sugars 1x a day - much better in am: - am: 88-109 >> 85-100, 115 x1 >> 70-90 >> 165-180 >> 85-105 - 2h after b'fast: n/c - lunch: 95-101 >> n/c >> 135-155 - 2h after lunch: 130-135 >> 110-125 >> 190 >> 135-155 - before dinner (snacking): 170s - 2h after dinner: 165-175 (when she has a larger dinner or dessert) >> 140's >> >200 >> 220-230 No lows. Lowest sugar was 97 >> 80 >> 75 >> 135 >> 85; ? she has hypoglycemia awareness. Highest: 190 >> 230.  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   - no CKD, last BUN/creatinine:  Lab Results  Component Value Date   BUN 14 09/16/2014   CREATININE 1.04 09/16/2014   - last eye exam was in 04/2014. No DR. - no numbness and tingling in feet.  HL: Reviewed last 3 sets of lipids - much after we started Pravastatin 40 mg. She then stopped >> cholesterol levels worse >> at last visit, we started Livalo.  She is compliant with this.   Lab Results  Component Value Date   CHOL 266* 02/03/2015   CHOL 197 02/05/2014   CHOL 234* 10/14/2013   Lab Results  Component Value Date   HDL 44.40 02/03/2015   HDL 45 02/05/2014   HDL 51 10/14/2013   Lab Results  Component Value Date   LDLCALC 196* 02/03/2015   LDLCALC 117* 02/05/2014   LDLCALC 156* 10/14/2013   Lab Results   Component Value Date   TRIG 128.0 02/03/2015   TRIG 177* 02/05/2014   TRIG 136 10/14/2013   Lab Results  Component Value Date   CHOLHDL 6 02/03/2015   CHOLHDL 4.4 02/05/2014   CHOLHDL 4.6 10/14/2013   Her last LFTs are normal: Lab Results  Component Value Date   ALT 11 02/03/2015   AST 14 02/03/2015   ALKPHOS 87 02/03/2015   BILITOT 0.2 02/03/2015   Has FH of HL in sister.  I reviewed pt's medications, allergies, PMH, social hx, family hx, and changes were documented in the history of present illness. Otherwise, unchanged from my initial visit note.  ROS: Constitutional: + weight gain, + fatigue, + hot flushes, + poor sleep Eyes: no blurry vision, no xerophthalmia ENT: no sore throat, no nodules palpated in throat, no dysphagia/odynophagia Cardiovascular: no CP/SOB/palpitations/leg swelling Respiratory: no cough/no SOB/wheezing Gastrointestinal: no N/V/D/C/heartburn Musculoskeletal: no muscle/joint aches Skin: no rashes Neurological: no tremors/numbness/tingling/dizziness, no HA  I reviewed pt's medications, allergies, PMH, social hx, family hx, and changes were documented in the history of present illness. Otherwise, unchanged from my initial visit note.  PE: BP 112/64 mmHg  Pulse 75  Temp(Src) 98.2 F (36.8 C) (Oral)  Resp 12  Wt 206 lb (93.441 kg)  SpO2 98% Body  mass index is 35.34 kg/(m^2). Wt Readings from Last 3 Encounters:  04/01/15 206 lb (93.441 kg)  02/14/15 204 lb (92.534 kg)  02/04/15 207 lb (93.895 kg)   Constitutional: overweight, in NAD Eyes: PERRLA, EOMI, no exophthalmos ENT: moist mucous membranes, no thyromegaly, no cervical lymphadenopathy Cardiovascular: RRR, No MRG Respiratory: CTA B Gastrointestinal: abdomen soft, NT, ND, BS+ Musculoskeletal: no deformities, strength intact in all 4 Skin: moist, warm, no rashes Neurological: no tremor with outstretched hands, DTR normal in all 4  ASSESSMENT: 1. DM2, non-insulin requiring, without  omplications  2. HL  PLAN:  1. Patient with DM2, on metformin ER and now Januvia. Sugars better in am but still high later in the day especially over the [place]. Will add Invokana (no cost with coupon card). Will also switch to Tradjenta b/c cheaper than Januvia if she uses the coupon card (10$ instead of 40$/mo). - I suggested to: Patient Instructions  Please continue: - Metformin ER 500 mg 2x a day  Change Januvia to: - Tradjenta 5 mg in am, before b'fast.  Please add: - Invokana 100 mg in am, before b'fast.  Please return in 1.5 months with your sugar log.   - we discussed about SEs of Invokana, which are: dizziness (advised to be careful when stands from sitting position), decreased BP - usually not < normal (BP today is not low), and fungal UTIs (advised to let me know if develops one).  - given discount card for Invokana - continue checking sugars at different times of the day - check 1-2 day, rotating checks - UTD with yearly eye exams  - check HbA1c at next visit, last one was the second one in the DM range. - Return to clinic in 1.5 mo with sugar log  2. HL - Pt with high LDL, also prediabetic, obese - switched to Livalo at last visit (better influence on her sugars than Pravastatin) >> will check Lipids at next visit - discuss to pay more attention to her diet - she initially requested a referral to nutrition but would like to wait until next visit.

## 2015-04-08 ENCOUNTER — Ambulatory Visit: Payer: 59 | Admitting: Obstetrics and Gynecology

## 2015-04-08 ENCOUNTER — Ambulatory Visit: Payer: Self-pay | Admitting: Internal Medicine

## 2015-04-12 DIAGNOSIS — E1169 Type 2 diabetes mellitus with other specified complication: Secondary | ICD-10-CM | POA: Insufficient documentation

## 2015-04-12 DIAGNOSIS — E119 Type 2 diabetes mellitus without complications: Secondary | ICD-10-CM | POA: Insufficient documentation

## 2015-04-14 ENCOUNTER — Encounter: Payer: Self-pay | Admitting: *Deleted

## 2015-04-14 ENCOUNTER — Encounter: Payer: 59 | Attending: Internal Medicine | Admitting: *Deleted

## 2015-04-14 VITALS — Ht 65.0 in | Wt 206.5 lb

## 2015-04-14 DIAGNOSIS — Z713 Dietary counseling and surveillance: Secondary | ICD-10-CM | POA: Diagnosis not present

## 2015-04-14 DIAGNOSIS — E119 Type 2 diabetes mellitus without complications: Secondary | ICD-10-CM | POA: Diagnosis not present

## 2015-04-14 NOTE — Patient Instructions (Signed)
Plan:  Aim for 2 Carb Choices per meal (30 grams) +/- 1 either way  Aim for 0-1 Carbs per snack if hungry  Include protein in moderation with your meals and snacks Consider reading food labels for Total Carbohydrate of foods Continue checking BG at alternate times per day as directed by MD  Continue taking medications as directed by MD

## 2015-04-14 NOTE — Progress Notes (Signed)
Diabetes Self-Management Education  Visit Type: First/Initial  Appt. Start Time: 0730 Appt. End Time: 0900  04/14/2015  [name], identified by name and date of birth, is a 55 y.o. female with a diagnosis of Diabetes: Type 2.   ASSESSMENT  Height 5\' 5"  (1.651 m), weight 206 lb 8 oz (93.668 kg). Body mass index is 34.36 kg/(m^2).      Diabetes Self-Management Education - 04/14/15 0738    Visit Information   Visit Type First/Initial   Initial Visit   Diabetes Type Type 2   Are you currently following a meal plan? No   Are you taking your medications as prescribed? Yes   Health Coping   How would you rate your overall health? Good   Psychosocial Assessment   Patient Belief/Attitude about Diabetes Motivated to manage diabetes   Self-care barriers None   Self-management support Family   Other persons present Patient   Patient Concerns Nutrition/Meal planning;Weight Control   Special Needs None   Preferred Learning Style Auditory;Hands on   Learning Readiness Ready   How often do you need to have someone help you when you read instructions, pamphlets, or other written materials from your doctor or pharmacy? 1 - Never   What is the last grade level you completed in school? AA degree   Complications   Last HgB A1C per patient/outside source 6.5 %   How often do you check your blood sugar? > 4 times/day   Fasting Blood glucose range (mg/dL) 70-129   Postprandial Blood glucose range (mg/dL) 70-129   Number of hypoglycemic episodes per month 0   Have you had a dilated eye exam in the past 12 months? Yes   Have you had a dental exam in the past 12 months? Yes   Are you checking your feet? Yes   How many days per week are you checking your feet? 4   Dietary Intake   Breakfast [place] bacon, boiled egg, toast OR hash browns, 1/2 regular sausage biscuit OR    Snack (morning) veggie chips OR cheese its OR raw veggies with Ranch dressing   Lunch take out lately OR roasted  vegetables, lean meat, bread OR chicken wings and salad from cafeteria   Snack (afternoon) fresh fruit   Dinner used to eat fried chicken or other meat, vegetables, starch, occasioanally salad   Beverage(s) coffee with Splenda and non dairy or flavored creamer, water, lemonade    Exercise   Exercise Type Light (walking / raking leaves)   How many days per week to you exercise? 2   How many minutes per day do you exercise? 30   Total minutes per week of exercise 60   Patient Education   Previous Diabetes Education Yes (please comment)  2014   Disease state  Definition of diabetes, type 1 and 2, and the diagnosis of diabetes   Nutrition management  Role of diet in the treatment of diabetes and the relationship between the three main macronutrients and blood glucose level;Food label reading, portion sizes and measuring food.;Carbohydrate counting   Physical activity and exercise  Role of exercise on diabetes management, blood pressure control and cardiac health.   Medications Reviewed patients medication for diabetes, action, purpose, timing of dose and side effects.   Monitoring Purpose and frequency of SMBG.;Identified appropriate SMBG and/or A1C goals.   Acute complications Taught treatment of hypoglycemia - the 15 rule.   Chronic complications Relationship between chronic complications and blood glucose control   Psychosocial  adjustment Role of stress on diabetes   Individualized Goals (developed by patient)   Nutrition Follow meal plan discussed   Physical Activity --  planned for next visit, she does have a plan to increase her activity level already   Medications take my medication as prescribed   Monitoring  test blood glucose pre and post meals as discussed   Outcomes   Expected Outcomes Demonstrated interest in learning. Expect positive outcomes   Future DMSE 4-6 wks   Program Status Not Completed      Individualized Plan for Diabetes Self-Management Training:   Learning  Objective:  Patient will have a greater understanding of diabetes self-management. Patient education plan is to attend individual and/or group sessions per assessed needs and concerns.   Plan:   Patient Instructions  Plan:  Aim for 2 Carb Choices per meal (30 grams) +/- 1 either way  Aim for 0-1 Carbs per snack if hungry  Include protein in moderation with your meals and snacks Consider reading food labels for Total Carbohydrate of foods Continue checking BG at alternate times per day as directed by MD  Continue taking medications as directed by MD      Expected Outcomes:  Demonstrated interest in learning. Expect positive outcomes  Education material provided: Living Well with Diabetes, A1C conversion sheet, Meal plan card and Carbohydrate counting sheet, Medication handout  If problems or questions, patient to contact team via:  Phone and Email  Future DSME appointment: 4-6 wks

## 2015-04-15 MED FILL — HYDROCODON-APAP 7.5-325: 7.5-325 | 30 days supply | Qty: 30 | Fill #0

## 2015-04-15 MED FILL — MELOXICAM 7.5 MG TABLET: 7.5 | 30 days supply | Qty: 60 | Fill #0

## 2015-04-25 ENCOUNTER — Telehealth: Payer: Self-pay | Admitting: *Deleted

## 2015-04-25 NOTE — Telephone Encounter (Signed)
Pt called stating that she is not feeling well. She has had a headache for a week and feels lightheaded. Her blood sugar is fine, but has been feeling this way since she started the new meds. Please advise.

## 2015-04-25 NOTE — Telephone Encounter (Signed)
Called pt and advised her per Dr Arman Filter message. Pt voiced understanding and will let us know if the side effects return.

## 2015-04-25 NOTE — Telephone Encounter (Signed)
Let's stop iNVOKANA for few days, hydrate herself and then restart at half a tablet a day to see how she tolerates it.

## 2015-04-26 ENCOUNTER — Other Ambulatory Visit (INDEPENDENT_AMBULATORY_CARE_PROVIDER_SITE_OTHER): Payer: 59

## 2015-04-26 ENCOUNTER — Other Ambulatory Visit: Payer: Self-pay | Admitting: *Deleted

## 2015-04-26 DIAGNOSIS — E1165 Type 2 diabetes mellitus with hyperglycemia: Secondary | ICD-10-CM | POA: Diagnosis not present

## 2015-04-26 DIAGNOSIS — E785 Hyperlipidemia, unspecified: Secondary | ICD-10-CM | POA: Diagnosis not present

## 2015-04-26 LAB — LIPID PANEL
Cholesterol: 179 mg/dL (ref 0–200)
HDL: 42.6 mg/dL (ref >39.00)
LDL Cholesterol: 123 mg/dL — ABNORMAL HIGH (ref 0–99)
NonHDL: 136.46
Total CHOL/HDL Ratio: 4
Triglycerides: 66 mg/dL (ref 0.0–149.0)
VLDL: 13.2 mg/dL (ref 0.0–40.0)

## 2015-04-26 LAB — HEMOGLOBIN A1C: Hgb A1c MFr Bld: 6.3 % (ref 4.6–6.5)

## 2015-04-26 LAB — HEPATIC FUNCTION PANEL
ALT: 9 U/L (ref 0–35)
AST: 12 U/L (ref 0–37)
Albumin: 3.8 g/dL (ref 3.5–5.2)
Alkaline Phosphatase: 69 U/L (ref 39–117)
Bilirubin, Direct: 0 mg/dL (ref 0.0–0.3)
Total Bilirubin: 0.3 mg/dL (ref 0.2–1.2)
Total Protein: 7 g/dL (ref 6.0–8.3)

## 2015-04-26 MED FILL — LIVALO 2 MG TABLET: 2 | 30 days supply | Qty: 30 | Fill #1

## 2015-04-29 ENCOUNTER — Ambulatory Visit (INDEPENDENT_AMBULATORY_CARE_PROVIDER_SITE_OTHER): Payer: 59 | Admitting: Obstetrics and Gynecology

## 2015-04-29 ENCOUNTER — Encounter: Payer: Self-pay | Admitting: Obstetrics and Gynecology

## 2015-04-29 VITALS — BP 130/76 | HR 80 | Ht 64.0 in | Wt 205.8 lb

## 2015-04-29 DIAGNOSIS — N3281 Overactive bladder: Secondary | ICD-10-CM | POA: Diagnosis not present

## 2015-04-29 DIAGNOSIS — N762 Acute vulvitis: Secondary | ICD-10-CM | POA: Diagnosis not present

## 2015-04-29 MED ORDER — FLUCONAZOLE 150 MG PO TABS: 150.0000 mg | ORAL_TABLET | Freq: Once | ORAL | Status: DC

## 2015-04-29 MED ORDER — MIRABEGRON ER 25 MG PO TB24: 25.0000 mg | ORAL_TABLET | Freq: Every day | ORAL | Status: DC

## 2015-04-29 MED ORDER — ESTRADIOL 1 MG PO TABS: 1.0000 mg | ORAL_TABLET | Freq: Two times a day (BID) | ORAL | Status: DC

## 2015-04-29 MED FILL — MYRBETRIQ ER 25 MG TABLET: 25 | 30 days supply | Qty: 30 | Fill #0

## 2015-04-29 MED FILL — ESTRADIOL 1 MG TABLET: 1 | 30 days supply | Qty: 60 | Fill #0

## 2015-04-29 MED FILL — FLUCONAZOLE 150 MG TABLET: 150 | 2 days supply | Qty: 2 | Fill #0

## 2015-04-29 NOTE — Progress Notes (Signed)
Patient ID: [name], female   DOB: 1960/05/08, 55 y.o.   MRN: [id number] GYNECOLOGY  VISIT   HPI: 55 y.o.   Married  [place] American  female   [id number] with No LMP recorded. Patient has had a hysterectomy.   here for follow up after starting Myrbetriq.    Taking 25 mg daily. Less leakage episodes overall and wants to continue.  Has better and worse days. Having more urgency.  NF - once per night maybe.  Rx is expensive.   Is now off Invocana.    Wants to take less medication for her diabetes.  HgbA1C now 6.3. Feels vulvar irritation.  Thinks she may have a yeast infection.  Wants Rx for yeast.   GYNECOLOGIC HISTORY: No LMP recorded. Patient has had a hysterectomy. Contraception:Hysterectomy Menopausal hormone therapy: Estrace 1mg   Last mammogram: 12-03-14 3D/Density Cat.C/Neg/BiRads1:[facility] Last pap smear: 10-14-13 Neg:Neg HR HPV        OB History    Gravida Para Term Preterm AB TAB SAB Ectopic Multiple Living   3 2 2  1     2          Patient Active Problem List   Diagnosis Date Noted  . Type 2 diabetes mellitus with hyperglycemia, without long-term current use of insulin ([place]) 04/12/2015  . Influenza 02/14/2015  . Atopic dermatitis 11/12/2014  . Left-sided face pain 10/08/2014  . Sinusitis, acute maxillary 09/14/2014  . Acute pharyngitis 08/13/2014  . Urine, incontinence, stress female 06/18/2014  . Left foot pain 06/18/2014  . Memory loss 01/22/2014  . Depression with anxiety 01/22/2014  . VAIN I (vaginal intraepithelial neoplasia grade I)   . Hyperlipidemia 10/22/2013  . Wears glasses   . Arthritis   . History of measles, mumps, or rubella   . Back pain   . Sleep deprivation   . Fatigue   . Blurred vision   . Menopausal symptoms 10/05/2011  . MIGRAINE HEADACHE 10/04/2008  . CHEST PAIN 10/04/2008    Past Medical History  Diagnosis Date  . Borderline diabetic   . Weight gain   . Sleep deprivation     loss of sleep  . Fatigue   .  Shortness of breath     with pain   . Nausea   . Blurred vision   . Wears glasses   . Arthritis   . Migraine headache   . History of measles, mumps, or rubella   . H/O varicella   . Hx of ovarian cyst   . Yeast infection   . H/O bacterial infection   . Back pain   . Depression   . 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 Anxiety   . Diabetes mellitus without complication [facility])     Past Surgical History  Procedure Laterality Date  . Abdominal hysterectomy    . Oophorectomy  2009  . Cholecystectomy  11/13/10  . Dilation and curettage of uterus    . Tubal ligation    . Dg gall bladder      Current Outpatient Prescriptions  Medication Sig Dispense Refill  . canagliflozin (INVOKANA) 100 MG TABS tablet Take 1 tablet (100 mg total) by mouth daily. 30 tablet 2  . estradiol (ESTRACE) 1 MG tablet Take 1 tablet (1 mg total) by mouth 2 (two) times daily. 60 tablet 1  . glucose blood (BAYER CONTOUR TEST) test strip Use 2x a day 100 each 11  .  HYDROcodone-acetaminophen (NORCO) 7.5-325 MG per tablet Take 1 tablet by mouth daily as needed for moderate pain.    Marland Kitchen linagliptin (TRADJENTA) 5 MG TABS tablet Take 1 tablet (5 mg total) by mouth daily. 30 tablet 2  . meloxicam (MOBIC) 7.5 MG tablet Take 7.5 mg by mouth 2 (two) times daily.     . metFORMIN (GLUCOPHAGE-XR) 500 MG 24 hr tablet Take 1 tablet (500 mg total) by mouth 2 (two) times daily with a meal. 60 tablet 2  . mirabegron ER (MYRBETRIQ) 25 MG TB24 tablet Take 1 tablet (25 mg total) by mouth daily. 30 tablet 2  . Pitavastatin Calcium (LIVALO) 1 MG TABS Take 1 tablet by mouth daily.     No current facility-administered medications for this visit.     ALLERGIES: Sulfa antibiotics  Family History  Problem Relation Age of Onset  . Hypertension Mother   . Hypotension Mother   . Anemia Mother     low iron  . Heart disease Sister   . Sickle cell trait Other   . Alcohol  abuse Brother   . Alcohol abuse Brother   . Drug abuse Brother     Social History   Social History  . Marital Status: Married    Spouse Name: N/A  . Number of Children: N/A  . Years of Education: N/A   Occupational History  . Not on file.   Social History Main Topics  . Smoking status: Former Smoker -- 0.50 packs/day    Types: Cigarettes  . Smokeless tobacco: Never Used  . Alcohol Use: 0.0 oz/week    0 Standard drinks or equivalent per week  . Drug Use: No  . Sexual Activity:    Partners: Male    Birth Control/ Protection: Surgical     Comment: Hysterectomy   Other Topics Concern  . Not on file   Social History Narrative   Patient lives at home with her husband [name]). She works for [employer] and she has her associates degree. Two children.    ROS:  Pertinent items are noted in HPI.  PHYSICAL EXAMINATION:    BP 130/76 mmHg  Pulse 80  Ht 5\' 4"  (1.626 m)  Wt 205 lb 12.8 oz (93.35 kg)  BMI 35.31 kg/m2    General appearance: alert, cooperative and appears stated age   Pelvic: External genitalia:  Erythema of vulva.  No lesions noted.                              ASSESSMENT  Overactive bladder.  Stress incontinence. Yeast vulvitis.  Diabetes mellitus.   PLAN   Will continue with Myrbetriq 25 mg daily.  Rx give for 6 months.  Diflucan 150 mg po, #2, RF one.  I recommended weight loss for improved control of diabetes and reduction stress incontinence.  Follow up for annual exam and prn.  An After Visit Summary was printed and given to the patient.  _15_____ minutes face to face time of which over 50% was spent in counseling.

## 2015-05-06 ENCOUNTER — Other Ambulatory Visit: Payer: Self-pay

## 2015-05-06 VITALS — BP 112/76 | HR 76 | Resp 16 | Ht 65.5 in | Wt 207.0 lb

## 2015-05-06 DIAGNOSIS — E1165 Type 2 diabetes mellitus with hyperglycemia: Secondary | ICD-10-CM

## 2015-05-06 NOTE — Patient Outreach (Signed)
[facility]) Care Management   05/06/2015  [name] [birth year] [id number]  [name] is an 55 y.o. female.   Member seen for follow up office visit for Link to Wellness program for self management of Type 2 diabetes  Subjective: Member states that she has not been able to lose any weight even though she feels she is not eating a lot.  States she did go to [employer] but she did not feel that was the right program for her.  States she is feeling heartburn and bloating more.  States she does not sleep well and she feels tired most of the time.  States she has started walking on the treadmill at the [facility] gym twice a week and she plans to go 3 times a week.  States her blood sugars have been good.  States she is to see Dr.Gherghe on 2/24 and she is having her eye exam on 2/24 also.  Objective:   Review of Systems  Gastrointestinal: Positive for heartburn.  Genitourinary: Positive for frequency.  All other systems reviewed and are negative.   Physical Exam  Today's Vitals   05/06/15 1335  BP: 112/76  Pulse: 76  Resp: 16  Height: 1.664 m (5' 5.5")  Weight: 207 lb (93.895 kg)  SpO2: 98%  PainSc: 0-No pain    Current Medications:   Current Outpatient Prescriptions  Medication Sig Dispense Refill  . estradiol (ESTRACE) 1 MG tablet Take 1 tablet (1 mg total) by mouth 2 (two) times daily. 60 tablet 5  . fluconazole (DIFLUCAN) 150 MG tablet Take 1 tablet (150 mg total) by mouth once. Take one tablet.  Repeat in 48 hours if symptoms are not completely resolved. 2 tablet 1  . glucose blood (BAYER CONTOUR TEST) test strip Use 2x a day 100 each 11  . HYDROcodone-acetaminophen (NORCO) 7.5-325 MG per tablet Take 1 tablet by mouth daily as needed for moderate pain.    Marland Kitchen linagliptin (TRADJENTA) 5 MG TABS tablet Take 1 tablet (5 mg total) by mouth daily. 30 tablet 2  . meloxicam (MOBIC) 7.5 MG tablet Take 7.5 mg by mouth 2 (two) times daily.     . metFORMIN  (GLUCOPHAGE-XR) 500 MG 24 hr tablet Take 1 tablet (500 mg total) by mouth 2 (two) times daily with a meal. 60 tablet 2  . mirabegron ER (MYRBETRIQ) 25 MG TB24 tablet Take 1 tablet (25 mg total) by mouth daily. 30 tablet 5  . Pitavastatin Calcium (LIVALO) 1 MG TABS Take 1 tablet by mouth daily.    . canagliflozin (INVOKANA) 100 MG TABS tablet Take 1 tablet (100 mg total) by mouth daily. (Patient taking differently: Take 100 mg by mouth daily. Taking 1/2 tablet daily) 30 tablet 2   No current facility-administered medications for this visit.    Functional Status:   In your present state of health, do you have any difficulty performing the following activities: 05/06/2015 11/05/2014  Hearing? N N  Vision? N N  Difficulty concentrating or making decisions? N N  Walking or climbing stairs? N N  Dressing or bathing? N N  Doing errands, shopping? N N    Fall/Depression Screening:    PHQ 2/9 Scores 05/06/2015 04/14/2015 02/14/2015 01/28/2015 01/24/2015 11/10/2014 11/05/2014  PHQ - 2 Score 0 0 0 0 0 0 1  PHQ- 9 Score - - - - - - -    Assessment:  Member seen for follow up office visit for Link to [employer] for  self management of Type 2 DM. Member with hemoglobin A1C of 6.3 at last endocrinologist visit. Member has been trying tol watch her diet better but is not losing weight.  Reports she is exercising twice a week for 45 minutes.   Reports CBGs ranging 74-109 fasting.  Member to see endocrinologist on 05/13/15 and RD on 05/19/15.    Plan:   Plan to eat 30-45 GM (2-3 servings) of carbohydrate a meal and 15 GM for snacks.  Plan to eat snacks with protein. Plan to check blood sugars 1-2 times a day fasting or 1  -2 hours after eating with goals of less than 100 fasting or 140 or less after meals Plan to walk and go to gym 3 days a week for 30-45 minutes.  Goal is to 150 minutes a week Plan to complete EMMI programs by 07/02/15 Plan to see Dr. Cruzita Lederer on 05/13/15 Plan to keep food log and take  to appointment with Bev Paddock RD on 05/19/15 Plan to return to Link to Wellness 08/05/15 at 1:30PM  [facility] CM Care Plan Problem One        Most Recent Value   Care Plan Problem One  Potential for elevated blood sugars related to dx of prediabetes   Role Documenting the Problem One  Care Management Coordinator   Care Plan for Problem One  Active   [facility] Long Term Goal (31-90 days)  Member will maintain hemoglobin A1C at or below 6.4 until next Link to Wellness visit   [facility] Term Goal Start Date  05/06/15 Maryjane Hurter hemoglobin A1C 6.3]   Interventions for Problem One Long Term Goal  Reviewed CHO counting and portion control, Reinforced on importance of regular exercise for glycemic control, Instructed to discuss GI concerns with MD at visit, Instructed to keep food diary and to take to RD appt to review, Reinforced to eat protein with her snacks, Discussed trying to develop good sleep habits     [facility] CM Care Plan Problem Two        Most Recent Value   Care Plan Problem Two  Member with BMI of 32 and wishes to loss weight   Role Documenting the Problem Two  Care Management Coordinator   Care Plan for Problem Two  Not Active   [facility] Long Term Goal (31-90) days  Member will rejoin Weight Watchers within the next 90 days   [facility] Long Term Goal Start Date  11/05/14   [facility] Long Term Goal Met Date  05/06/15 [Did join but is no longer going ]    [name] RN, [facility] Care Management Coordinator-Link to [facility] Management [phone]

## 2015-05-06 NOTE — Patient Instructions (Signed)
1. Plan to eat 30-45 GM (2-3 servings) of carbohydrate a meal and 15 GM for snacks.  Plan to eat snacks with protein. 2. Plan to check blood sugars 1-2 times a day fasting or 1  -2 hours after eating with goals of less than 100 fasting or 140 or less after meals 3. Plan to walk and go to gym 3 days a week for 30-45 minutes.  Goal is to 150 minutes a week 4. Plan to complete EMMI programs by 07/02/15 5. Plan to see Dr. Cruzita Lederer on 05/13/15 6. Plan to keep food log and take to appointment with Bev Paddock RD on 05/19/15 7. Plan to return to Link to Wellness 08/05/15 at 1:30PM

## 2015-05-11 ENCOUNTER — Encounter: Payer: Self-pay | Admitting: Internal Medicine

## 2015-05-11 ENCOUNTER — Ambulatory Visit (INDEPENDENT_AMBULATORY_CARE_PROVIDER_SITE_OTHER): Payer: 59 | Admitting: Internal Medicine

## 2015-05-11 VITALS — BP 110/62 | HR 78 | Temp 98.3°F | Resp 12 | Wt 203.0 lb

## 2015-05-11 DIAGNOSIS — E785 Hyperlipidemia, unspecified: Secondary | ICD-10-CM | POA: Diagnosis not present

## 2015-05-11 DIAGNOSIS — E1165 Type 2 diabetes mellitus with hyperglycemia: Secondary | ICD-10-CM | POA: Diagnosis not present

## 2015-05-11 NOTE — Patient Instructions (Signed)
Please stay off Tradjenta for now.  In 2 weeks, if sugars are still as wonderful, stop Invokana.  For now, continue:  -Metformin ER 500 mg 2x a day - Invokana 50 mg daily.  Please return in 3 months with your sugar log.

## 2015-05-11 NOTE — Progress Notes (Signed)
Patient ID: [name], female   DOB: 1960/11/09, 55 y.o.   MRN: [id number]  HPI: [name] is a 55 y.o.-year-old female, returning for f/u for DM2; prediabetes dx early 2000s, controlled, without complications and also hyperlipidemia. Last visit 1 mo ago.  She started diet and exercise >> lost 3 lbs and the sugars improved! She also feels better.  Last hemoglobin A1c was: Lab Results  Component Value Date   HGBA1C 6.3 04/26/2015   HGBA1C 6.5 02/03/2015   HGBA1C 6.3 10/08/2014  She had several steroid inj in last year- last in 04/2014.   She is on: - Metformin ER 500 mg 2x a day - She initially had diarrhea/nausea with regular metformin - Tradjenta 5 mg in am, before b'fast - out since yesterday - Invokana 100 mg in am - started 03/2015 >> she had CP, HA, lightheadedness >> now on 50 mg daily  Pt checks sugars 3x a day - DRAMATICALLY better: - am: 88-109 >> 85-100, 115 x1 >> 70-90 >> 165-180 >> 85-105 >> 74-95 - 2h after b'fast: n/c >> 78-95 - lunch: 95-101 >> n/c >> 135-155 >> 77-95 - 2h after lunch: 130-135 >> 110-125 >> 190 >> 135-155 >> 87-95, 110 - before dinner (snacking): 170s >> 80-100 - 2h after dinner: 165-175 (when she has a larger dinner or dessert) >> 140's >> >200 >> 220-230 >> 90-114 - bedtime: 85-98 No lows. Lowest sugar was 97 >> 80 >> 75 >> 135 >> 85 >> 69; ? she has hypoglycemia awareness. Highest: 190 >> 230 >> 114.  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   - no CKD, last BUN/creatinine:  Lab Results  Component Value Date   BUN 14 09/16/2014   CREATININE 1.04 09/16/2014   - last eye exam was in 04/2014. No DR. - no numbness and tingling in feet.  HL: Reviewed last 3 sets of lipids - now on Livalo.  She is compliant with this.   Lab Results  Component Value Date   CHOL 179 04/26/2015   CHOL 266* 02/03/2015   CHOL 197 02/05/2014   Lab Results  Component Value Date   HDL 42.60 04/26/2015   HDL 44.40 02/03/2015   HDL 45 02/05/2014   Lab Results  Component Value Date   LDLCALC 123* 04/26/2015   LDLCALC 196* 02/03/2015   LDLCALC 117* 02/05/2014   Lab Results  Component Value Date   TRIG 66.0 04/26/2015   TRIG 128.0 02/03/2015   TRIG 177* 02/05/2014   Lab Results  Component Value Date   CHOLHDL 4 04/26/2015   CHOLHDL 6 02/03/2015   CHOLHDL 4.4 02/05/2014   Her last LFTs are normal: Lab Results  Component Value Date   ALT 9 04/26/2015   AST 12 04/26/2015   ALKPHOS 69 04/26/2015   BILITOT 0.3 04/26/2015   Has FH of HL in sister.  I reviewed pt's medications, allergies, PMH, social hx, family hx, and changes were documented in the history of present illness. Otherwise, unchanged from my initial visit note.  ROS: Constitutional: + weight loss, no fatigue, no heat or cold intolerance  Eyes: no blurry vision, no xerophthalmia ENT: no sore throat, no nodules palpated in throat, no dysphagia/odynophagia Cardiovascular: no CP/SOB/palpitations/leg swelling Respiratory: no cough/no SOB/wheezing Gastrointestinal: no N/V/D/C/heartburn Musculoskeletal: no muscle/joint aches Skin: no rashes Neurological: no tremors/numbness/tingling/dizziness, no HA  I reviewed pt's medications, allergies, PMH, social hx, family hx, and changes were documented in the history  of present illness. Otherwise, unchanged from my initial visit note.  PE: BP 110/62 mmHg  Pulse 78  Temp(Src) 98.3 F (36.8 C) (Oral)  Resp 12  Wt 203 lb (92.08 kg)  SpO2 98% Body mass index is 33.26 kg/(m^2). Wt Readings from Last 3 Encounters:  05/11/15 203 lb (92.08 kg)  05/06/15 207 lb (93.895 kg)  04/29/15 205 lb 12.8 oz (93.35 kg)   Constitutional: overweight, in NAD Eyes: PERRLA, EOMI, no exophthalmos ENT: moist mucous membranes, no thyromegaly, no cervical lymphadenopathy Cardiovascular: RRR, No MRG Respiratory: CTA B Gastrointestinal: abdomen soft, NT, ND, BS+ Musculoskeletal: no deformities,  strength intact in all 4 Skin: moist, warm, no rashes Neurological: no tremor with outstretched hands, DTR normal in all 4  ASSESSMENT: 1. DM2, non-insulin requiring, without omplications  2. HL  PLAN:  1. Patient with DM2, on metformin ER, Tradjenta and Invokana. Sugars are greatly improved >> all at goal per her detailed log! She ran out of Tradjenta yesterday >> advised her to stay off it and, in 2 weeks, to also stop Invokana if sugars are still as good as now. - I suggested to: Patient Instructions  Please stay off Tradjenta for now.  In 2 weeks, if sugars are still as wonderful, stop Invokana.  For now, continue:  -Metformin ER 500 mg 2x a day - Invokana 50 mg daily.  Please return in 3 months with your sugar log.  - continue checking sugars at different times of the day - check 1x day, rotating checks - UTD with yearly eye exams >> will have one in 2 days - check HbA1c at next visit - Return to clinic in 3 mo with sugar log  2. HL - Pt with high LDL >> improved after switching to Livalo (better influence on her sugars than Pravastatin) and changed her diet - reviewed latest Lipid panel (from earlier this month) together

## 2015-05-13 ENCOUNTER — Encounter: Payer: Self-pay | Admitting: Family Medicine

## 2015-05-13 ENCOUNTER — Ambulatory Visit (INDEPENDENT_AMBULATORY_CARE_PROVIDER_SITE_OTHER): Payer: 59 | Admitting: Family Medicine

## 2015-05-13 ENCOUNTER — Ambulatory Visit: Payer: Self-pay | Admitting: Internal Medicine

## 2015-05-13 ENCOUNTER — Encounter: Payer: Self-pay | Admitting: Internal Medicine

## 2015-05-13 VITALS — BP 119/76 | HR 87 | Temp 98.3°F | Ht 66.0 in | Wt 204.0 lb

## 2015-05-13 DIAGNOSIS — R5383 Other fatigue: Secondary | ICD-10-CM

## 2015-05-13 DIAGNOSIS — R1013 Epigastric pain: Secondary | ICD-10-CM | POA: Diagnosis not present

## 2015-05-13 DIAGNOSIS — K219 Gastro-esophageal reflux disease without esophagitis: Secondary | ICD-10-CM | POA: Diagnosis not present

## 2015-05-13 DIAGNOSIS — H5203 Hypermetropia, bilateral: Secondary | ICD-10-CM | POA: Diagnosis not present

## 2015-05-13 DIAGNOSIS — K30 Functional dyspepsia: Secondary | ICD-10-CM

## 2015-05-13 LAB — CBC
HCT: 37.5 % (ref 36.0–46.0)
Hemoglobin: 12.7 g/dL (ref 12.0–15.0)
MCH: 28.9 pg (ref 26.0–34.0)
MCHC: 33.9 g/dL (ref 30.0–36.0)
MCV: 85.2 fL (ref 78.0–100.0)
MPV: 9.6 fL (ref 8.6–12.4)
Platelets: 346 10*3/uL (ref 150–400)
RBC: 4.4 MIL/uL (ref 3.87–5.11)
RDW: 14.5 % (ref 11.5–15.5)
WBC: 6 10*3/uL (ref 4.0–10.5)

## 2015-05-13 LAB — TSH: TSH: 0.77 mIU/L

## 2015-05-13 LAB — POCT H PYLORI SCREEN: H Pylori Screen, POC: NEGATIVE

## 2015-05-13 LAB — VITAMIN B12: Vitamin B-12: 428 pg/mL (ref 200–1100)

## 2015-05-13 LAB — HM DIABETES EYE EXAM

## 2015-05-13 MED ORDER — LANSOPRAZOLE 15 MG PO TBDP: 15.0000 mg | ORAL_TABLET | Freq: Every day | ORAL | Status: DC

## 2015-05-13 MED FILL — LANSOPRAZOLE DR 15 MG CAP: 15 | 60 days supply | Qty: 60 | Fill #0

## 2015-05-13 NOTE — Assessment & Plan Note (Addendum)
Patient indigestion likely caused by gastritis. Patient started on Pepcid for 8 weeks. I recommended GI evaluation since she has never had colonoscopy done. She stated she does not want colonoscopy but will get EGD. I referred her to Gi to further discuss this. H. Pylori done today was negative. I will see her back in 4-8wks.

## 2015-05-13 NOTE — Assessment & Plan Note (Signed)
Might be stress induced. R/O Metabolic or endocrine cause. She is already on DM treatment regimen. TSH checked as well as Vit D and B12 level. I recommended graded exercise. I will contact her with test result.

## 2015-05-13 NOTE — Progress Notes (Signed)
Subjective:     Patient ID: [name], female   DOB: 01/31/1961, 55 y.o.   MRN: [id number]  HPI Indigestion:Food get stuck on her chest causing pressure pain on her chest and back. She belches a lot. Worse with beef, stacks, broccoli. This occurs daily every time she eats solid food, on going for 2 months and it is worsening. She has constipation, no diarrhea, no vomiting, feels nauseous at time. No blood in her stool. She feels bloated in her stomach. Appetitite is good, she denies any major weight loss, she will gain weight and loss it. She had issue with gastric reflux in the past for which she did well on PPI.  Fatigue: On going for 6 months, she feels worn out, sleepy every time. She sleeps well at night but does not feel rested. No feeling of depression. Father in-law has dementia which could be causing stress for her.  Current Outpatient Prescriptions on File Prior to Visit  Medication Sig Dispense Refill  . canagliflozin (INVOKANA) 100 MG TABS tablet Take 1 tablet (100 mg total) by mouth daily. (Patient taking differently: Take 100 mg by mouth daily. Taking 1/2 tablet daily) 30 tablet 2  . estradiol (ESTRACE) 1 MG tablet Take 1 tablet (1 mg total) by mouth 2 (two) times daily. 60 tablet 5  . HYDROcodone-acetaminophen (NORCO) 7.5-325 MG per tablet Take 1 tablet by mouth daily as needed for moderate pain.    . meloxicam (MOBIC) 7.5 MG tablet Take 7.5 mg by mouth 2 (two) times daily.     . metFORMIN (GLUCOPHAGE-XR) 500 MG 24 hr tablet Take 1 tablet (500 mg total) by mouth 2 (two) times daily with a meal. 60 tablet 2  . mirabegron ER (MYRBETRIQ) 25 MG TB24 tablet Take 1 tablet (25 mg total) by mouth daily. 30 tablet 5  . Pitavastatin Calcium (LIVALO) 1 MG TABS Take 1 tablet by mouth daily.    Marland Kitchen glucose blood (BAYER CONTOUR TEST) test strip Use 2x a day 100 each 11   No current facility-administered medications on file prior to visit.   Past Medical History  Diagnosis Date  .  Borderline diabetic   . Weight gain   . Sleep deprivation     loss of sleep  . Fatigue   . Shortness of breath     with pain   . Nausea   . Blurred vision   . Wears glasses   . Arthritis   . Migraine headache   . History of measles, mumps, or rubella   . H/O varicella   . Hx of ovarian cyst   . Yeast infection   . H/O bacterial infection   . Back pain   . Depression   . 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 Anxiety   . Diabetes mellitus without complication ([place])     Filed Vitals:   05/13/15 0843  BP: 119/76  Pulse: 87  Temp: 98.3 F (36.8 C)     Review of Systems  Respiratory: Negative.   Cardiovascular: Negative.   Gastrointestinal: Positive for constipation. Negative for nausea, abdominal pain, diarrhea, blood in stool, anal bleeding and rectal pain.       Pain in epigastric region with food.   All other systems reviewed and are negative.      Objective:   Physical Exam  Constitutional: She is oriented to person, place, and time. She appears well-developed. No distress.  Cardiovascular: Normal rate, regular rhythm and normal heart sounds.   No murmur heard. Pulmonary/Chest: Effort normal and breath sounds normal. No respiratory distress. She has no wheezes.  Abdominal: Soft. Bowel sounds are normal. She exhibits no distension and no mass. There is no tenderness. There is no guarding.  Musculoskeletal: Normal range of motion. She exhibits no edema.  Neurological: She is alert and oriented to person, place, and time.  Nursing note and vitals reviewed.      Assessment:     Indigestion/GERD Fatigue     Plan:     Check problem list.

## 2015-05-13 NOTE — Patient Instructions (Signed)
Indigestion Indigestion is a feeling of pain, discomfort, burning, or fullness in the upper part of your belly (abdomen). It can come and go. It may occur often or rarely. Indigestion tends to happen while you are eating or right after you have finished eating. It may be worse at night and while bending over or lying down. HOME CARE Take these actions to lessen your pain or discomfort and to help avoid problems. Diet  Follow a diet as told by your doctor. You may need to avoid foods and drinks such as:  Coffee and tea (with or without caffeine).  Drinks that contain alcohol.  Energy drinks and sports drinks.  Carbonated drinks or sodas.  Chocolate and cocoa.  Peppermint and mint flavorings.  Garlic and onions.  Horseradish.  Spicy and acidic foods, such as peppers, chili powder, curry powder, vinegar, hot sauces, and BBQ sauce.  Citrus fruit juices and citrus fruits, such as oranges, lemons, and limes.  Tomato-based foods, such as red sauce, chili, salsa, and pizza with red sauce.  Fried and fatty foods, such as donuts, french fries, potato chips, and high-fat dressings.  High-fat meats, such as hot dogs, rib eye steak, sausage, ham, and bacon.  High-fat dairy items, such as whole milk, butter, and cream cheese.  Eat small meals often. Avoid eating large meals.  Avoid drinking large amounts of liquid with your meals.  Avoid eating meals during the 2-3 hours before bedtime.  Avoid lying down right after you eat.  Do not exercise right after you eat. General Instructions  Pay attention to any changes in your symptoms.  Take over-the-counter and prescription medicines only as told by your doctor. Do not take aspirin, ibuprofen, or other NSAIDs unless your doctor says it is okay.  Do not use any tobacco products, including cigarettes, chewing tobacco, and e-cigarettes. If you need help quitting, ask your doctor.  Wear loose clothes. Do not wear anything tight around  your waist.  Raise (elevate) the head of your bed about 6 inches (15 cm).  Try to lower your stress. If you need help doing this, ask your doctor.  If you are overweight, lose an amount of weight that is healthy for you. Ask your doctor about a safe weight loss goal.  Keep all follow-up visits as told by your doctor. This is important. GET HELP IF:  You have new symptoms.  You lose weight and you do not know why it is happening.  You have trouble swallowing, or it hurts to swallow.  Your symptoms do not get better with treatment.  Your symptoms last for more than two days.  You have a fever.  You throw up (vomit). GET HELP RIGHT AWAY IF:  You have pain in your arms, neck, jaw, teeth, or back.  You feel sweaty, dizzy, or light-headed.  You pass out (faint).  You have chest pain or shortness of breath.  You cannot stop throwing up, or you throw up blood.  Your poop (stool) is bloody or black.  You have very bad pain in your belly.   This information is not intended to replace advice given to you by your health care provider. Make sure you discuss any questions you have with your health care provider.   Document Released: 04/07/2010 Document Revised: 11/24/2014 Document Reviewed: 06/30/2014 Elsevier Interactive Patient Education 2016 [employer]  

## 2015-05-14 LAB — VITAMIN D 25 HYDROXY (VIT D DEFICIENCY, FRACTURES): Vit D, 25-Hydroxy: 9 ng/mL — ABNORMAL LOW (ref 30–100)

## 2015-05-15 ENCOUNTER — Other Ambulatory Visit: Payer: Self-pay

## 2015-05-15 ENCOUNTER — Emergency Department (HOSPITAL_COMMUNITY): Payer: 59

## 2015-05-15 ENCOUNTER — Encounter (HOSPITAL_COMMUNITY): Payer: Self-pay | Admitting: Emergency Medicine

## 2015-05-15 ENCOUNTER — Observation Stay (HOSPITAL_COMMUNITY)
Admission: EM | Admit: 2015-05-15 | Discharge: 2015-05-17 | Disposition: A | Discharge status: Home / Self Care | Payer: 59 | Attending: Family Medicine | Admitting: Family Medicine

## 2015-05-15 DIAGNOSIS — E559 Vitamin D deficiency, unspecified: Secondary | ICD-10-CM | POA: Insufficient documentation

## 2015-05-15 DIAGNOSIS — N393 Stress incontinence (female) (male): Secondary | ICD-10-CM | POA: Diagnosis not present

## 2015-05-15 DIAGNOSIS — Z6833 Body mass index (BMI) 33.0-33.9, adult: Secondary | ICD-10-CM | POA: Insufficient documentation

## 2015-05-15 DIAGNOSIS — R072 Precordial pain: Principal | ICD-10-CM | POA: Insufficient documentation

## 2015-05-15 DIAGNOSIS — E7800 Pure hypercholesterolemia, unspecified: Secondary | ICD-10-CM | POA: Diagnosis not present

## 2015-05-15 DIAGNOSIS — R0602 Shortness of breath: Secondary | ICD-10-CM | POA: Insufficient documentation

## 2015-05-15 DIAGNOSIS — E669 Obesity, unspecified: Secondary | ICD-10-CM | POA: Insufficient documentation

## 2015-05-15 DIAGNOSIS — Z79890 Hormone replacement therapy (postmenopausal): Secondary | ICD-10-CM | POA: Diagnosis not present

## 2015-05-15 DIAGNOSIS — E119 Type 2 diabetes mellitus without complications: Secondary | ICD-10-CM | POA: Diagnosis not present

## 2015-05-15 DIAGNOSIS — K219 Gastro-esophageal reflux disease without esophagitis: Secondary | ICD-10-CM | POA: Insufficient documentation

## 2015-05-15 DIAGNOSIS — M1990 Unspecified osteoarthritis, unspecified site: Secondary | ICD-10-CM | POA: Insufficient documentation

## 2015-05-15 DIAGNOSIS — R0789 Other chest pain: Secondary | ICD-10-CM | POA: Diagnosis not present

## 2015-05-15 DIAGNOSIS — Z7984 Long term (current) use of oral hypoglycemic drugs: Secondary | ICD-10-CM | POA: Diagnosis not present

## 2015-05-15 DIAGNOSIS — N3281 Overactive bladder: Secondary | ICD-10-CM | POA: Diagnosis not present

## 2015-05-15 DIAGNOSIS — Z87891 Personal history of nicotine dependence: Secondary | ICD-10-CM | POA: Insufficient documentation

## 2015-05-15 DIAGNOSIS — R079 Chest pain, unspecified: Secondary | ICD-10-CM | POA: Diagnosis not present

## 2015-05-15 HISTORY — DX: Chest pain, unspecified: R07.9

## 2015-05-15 LAB — BASIC METABOLIC PANEL
Anion gap: 10 (ref 5–15)
BUN: 8 mg/dL (ref 6–20)
CO2: 24 mmol/L (ref 22–32)
Calcium: 9.4 mg/dL (ref 8.9–10.3)
Chloride: 106 mmol/L (ref 101–111)
Creatinine, Ser: 0.88 mg/dL (ref 0.44–1.00)
GFR calc Af Amer: >60 mL/min (ref >60)
GFR calc non Af Amer: >60 mL/min (ref >60)
Glucose, Bld: 108 mg/dL — ABNORMAL HIGH (ref 65–99)
Potassium: 3.6 mmol/L (ref 3.5–5.1)
Sodium: 140 mmol/L (ref 135–145)

## 2015-05-15 LAB — CBC
HCT: 37.7 % (ref 36.0–46.0)
Hemoglobin: 12.6 g/dL (ref 12.0–15.0)
MCH: 28.7 pg (ref 26.0–34.0)
MCHC: 33.4 g/dL (ref 30.0–36.0)
MCV: 85.9 fL (ref 78.0–100.0)
Platelets: 320 10*3/uL (ref 150–400)
RBC: 4.39 MIL/uL (ref 3.87–5.11)
RDW: 14.1 % (ref 11.5–15.5)
WBC: 6.2 10*3/uL (ref 4.0–10.5)

## 2015-05-15 LAB — I-STAT TROPONIN, ED
Troponin i, poc: 0 ng/mL (ref 0.00–0.08)
Troponin i, poc: 0 ng/mL (ref 0.00–0.08)

## 2015-05-15 LAB — LIPASE, BLOOD: Lipase: 20 U/L (ref 11–51)

## 2015-05-15 LAB — D-DIMER, QUANTITATIVE (NOT AT ARMC): D-Dimer, Quant: <0.27 ug/mL-FEU (ref 0.00–0.50)

## 2015-05-15 LAB — GLUCOSE, CAPILLARY: Glucose-Capillary: 114 mg/dL — ABNORMAL HIGH (ref 65–99)

## 2015-05-15 MED ORDER — MORPHINE SULFATE (PF) 2 MG/ML IV SOLN
2.0000 mg | INTRAVENOUS | Status: DC | PRN
  Administered 2015-05-16 – 2015-05-17 (×3): 2 mg via INTRAVENOUS
  Filled 2015-05-15 (×3): qty 1

## 2015-05-15 MED ORDER — ACETAMINOPHEN 325 MG PO TABS
650.0000 mg | ORAL_TABLET | ORAL | Status: DC | PRN
  Administered 2015-05-16 (×3): 650 mg via ORAL
  Filled 2015-05-15 (×3): qty 2

## 2015-05-15 MED ORDER — PRAVASTATIN SODIUM 40 MG PO TABS
40.0000 mg | ORAL_TABLET | Freq: Every day | ORAL | Status: DC
  Administered 2015-05-16: 40 mg via ORAL
  Filled 2015-05-15: qty 1

## 2015-05-15 MED ORDER — MIRABEGRON ER 25 MG PO TB24
25.0000 mg | ORAL_TABLET | Freq: Every day | ORAL | Status: DC
  Administered 2015-05-16 – 2015-05-17 (×2): 25 mg via ORAL
  Filled 2015-05-15 (×2): qty 1

## 2015-05-15 MED ORDER — ASPIRIN 325 MG PO TABS
325.0000 mg | ORAL_TABLET | Freq: Every day | ORAL | Status: DC
  Administered 2015-05-16 – 2015-05-17 (×2): 325 mg via ORAL
  Filled 2015-05-15 (×2): qty 1

## 2015-05-15 MED ORDER — BUTALBITAL-APAP-CAFFEINE 50-325-40 MG PO TABS
1.0000 | ORAL_TABLET | Freq: Once | ORAL | Status: AC
  Administered 2015-05-15: 1 via ORAL
  Filled 2015-05-15: qty 1

## 2015-05-15 MED ORDER — ESTRADIOL 1 MG PO TABS
1.0000 mg | ORAL_TABLET | Freq: Two times a day (BID) | ORAL | Status: DC
  Administered 2015-05-16 – 2015-05-17 (×4): 1 mg via ORAL
  Filled 2015-05-15 (×5): qty 1

## 2015-05-15 MED ORDER — ENOXAPARIN SODIUM 40 MG/0.4ML ~~LOC~~ SOLN
40.0000 mg | Freq: Every day | SUBCUTANEOUS | Status: DC
  Administered 2015-05-16 (×2): 40 mg via SUBCUTANEOUS
  Filled 2015-05-15 (×2): qty 0.4

## 2015-05-15 MED ORDER — PANTOPRAZOLE SODIUM 40 MG PO PACK
20.0000 mg | PACK | Freq: Every day | ORAL | Status: DC
  Administered 2015-05-16 – 2015-05-17 (×2): 20 mg via ORAL
  Filled 2015-05-15 (×2): qty 20

## 2015-05-15 MED ORDER — NITROGLYCERIN 0.4 MG SL SUBL
0.4000 mg | SUBLINGUAL_TABLET | SUBLINGUAL | Status: DC | PRN
  Administered 2015-05-16: 0.4 mg via SUBLINGUAL
  Administered 2015-05-17: 0.8 mg via SUBLINGUAL
  Filled 2015-05-15 (×2): qty 1

## 2015-05-15 MED ORDER — ONDANSETRON HCL 4 MG/2ML IJ SOLN: 4.0000 mg | Freq: Four times a day (QID) | INTRAMUSCULAR | Status: DC | PRN

## 2015-05-15 NOTE — H&P (Signed)
[name]: [phone]  Patient name: [name] Medical record number: [id number] Date of birth: 1960/06/26 Age: 55 y.o. Gender: female  Primary Care Provider: Andrena Mews, MD Consultants: None Code Status: Full  Chief Complaint: Chest Pain  Assessment and Plan: [name] is a 55 y.o. female presenting with chest pain. PMH is significant for GERD, DM2, Depression/Anxiety, HLD.  # Chest Pain: Note history of indigestion causing chest pressure, occurs daily with food x92months. This feels different and was sudden and severe in onset, substernal, with shortness of breath and diaphoresis. Reassuring that reproducible on exam. Troponin negative x2. D Dimer negative at 0.27. CXR with no active cardiopulmonary disease.  - Place in observation, Dr. Ardelia Mems attending - NPO - Cardiac monitor - Trend Troponins - AM EKG - Nitro PRN - Aspirin 325mg  - Tylenol PRN mild pain, Morphine PRN moderate to severe pain - Zofran PRN nausea/vomiting - Protonix - Consult Cardiology in AM - Consider sleep apnea workup at discharge  # Diabetes: Invokana 50mg  (note prescribed 100mg  daily), Metformin 500mg  BID - Hold home medication - Sliding Scale Insulin, Sensitive  # Vitamin D Deficiency: Vitamin D level of 9 noted.  - Consider weekly treatment of Vitamin D at discharge  # Overactive Bladder: Currently on Myrbetriq - Continue home medication  FEN/GI: NPO Prophylaxis: Lovenox  Disposition: Pending Chest Pain rule out   History of Present Illness:  [name] is a 55 y.o. female presenting with sudden onset substernal chest pain radiating to left upper back. Started at 12pm today and continued until approximately 6pm. Also noted severe shortness of breath and diaphoresis. Pain and shortness of breath worsened with exertion, improved with rest. Notes history of GERD, but states this feels differently. Pain  is very sharp and goes through to back. Denies any current chest pain. Does not recall ever having pain like this. Admits to picking up her large grandchildren yesterday; husband concerned that she might have pulled a muscle yesterday. Denies nausea, vomiting. Of note, husband also reports she sometimes appears to stop breathing at night. States she was worked up for sleep apnea many years ago, but it was negative. States she always feels fatigued and occasionally has headache in the morning. Denies history of smoking or alcohol use.  Review Of Systems: Per HPI  Otherwise the remainder of the systems were negative.  Patient Active Problem List   Diagnosis Date Noted  . Chest pain 05/15/2015  . GERD (gastroesophageal reflux disease) 05/13/2015  . Type 2 diabetes mellitus with hyperglycemia, without long-term current use of insulin ([place]) 04/12/2015  . Urine, incontinence, stress female 06/18/2014  . Depression with anxiety 01/22/2014  . VAIN I (vaginal intraepithelial neoplasia grade I)   . Hyperlipidemia 10/22/2013  . Fatigue   . MIGRAINE HEADACHE 10/04/2008   Past Medical History: Past Medical History  Diagnosis Date  . Borderline diabetic   . Weight gain   . Sleep deprivation     loss of sleep  . Fatigue   . Shortness of breath     with pain   . Nausea   . Blurred vision   . Wears glasses   . Arthritis   . Migraine headache   . History of measles, mumps, or rubella   . H/O varicella   . Hx of ovarian cyst   . Yeast infection   . H/O bacterial infection   . Back pain   . Depression   .  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 Anxiety   . Diabetes mellitus without complication ([place])   . History of measles, mumps, or rubella   . Wears glasses    Past Surgical History: Past Surgical History  Procedure Laterality Date  . Abdominal hysterectomy    . Oophorectomy  2009  . Cholecystectomy  11/13/10  . Dilation  and curettage of uterus    . Tubal ligation    . Dg gall bladder     Social History: Social History  Substance Use Topics  . Smoking status: Former Smoker -- 0.50 packs/day    Types: Cigarettes  . Smokeless tobacco: Never Used  . Alcohol Use: 0.0 oz/week    0 Standard drinks or equivalent per week   Please also refer to relevant sections of EMR.  Family History: Family History  Problem Relation Age of Onset  . Hypertension Mother   . Hypotension Mother   . Anemia Mother     low iron  . Heart disease Sister   . Sickle cell trait Other   . Alcohol abuse Brother   . Alcohol abuse Brother   . Drug abuse Brother    Allergies and Medications: Allergies  Allergen Reactions  . Sulfa Antibiotics Anaphylaxis, Hives and Swelling   No current facility-administered medications on file prior to encounter.   Current Outpatient Prescriptions on File Prior to Encounter  Medication Sig Dispense Refill  . canagliflozin (INVOKANA) 100 MG TABS tablet Take 1 tablet (100 mg total) by mouth daily. (Patient taking differently: Take 50 mg by mouth daily before breakfast. ) 30 tablet 2  . estradiol (ESTRACE) 1 MG tablet Take 1 tablet (1 mg total) by mouth 2 (two) times daily. 60 tablet 5  . HYDROcodone-acetaminophen (NORCO) 7.5-325 MG per tablet Take 1 tablet by mouth daily as needed for moderate pain.    . meloxicam (MOBIC) 7.5 MG tablet Take 7.5 mg by mouth 2 (two) times daily.     . metFORMIN (GLUCOPHAGE-XR) 500 MG 24 hr tablet Take 1 tablet (500 mg total) by mouth 2 (two) times daily with a meal. 60 tablet 2  . mirabegron ER (MYRBETRIQ) 25 MG TB24 tablet Take 1 tablet (25 mg total) by mouth daily. 30 tablet 5  . glucose blood (BAYER CONTOUR TEST) test strip Use 2x a day 100 each 11  . lansoprazole (PREVACID SOLUTAB) 15 MG disintegrating tablet Take 1 tablet (15 mg total) by mouth daily at 12 noon. 60 tablet 1    Objective: BP 108/69 mmHg  Pulse 77  Temp(Src) 97.8 F (36.6 C) (Oral)  Resp  18  SpO2 99% Exam: General: Very pleasant 55yo female resting comfortably in no apparent distress Eyes: PERRLA, EOM intact ENTM: Pharynx clear, moist mucous membranes Neck: Supple, no lymphadenopathy Cardiovascular: S1 and S2 noted, no murmurs, regular rate and rhythm, tenderness to palpation of sternum, pedal pulses palpable Respiratory: Clear to auscultation bilaterally, no crackles or wheezes, no increased work of breathing Abdomen: Bowel sounds noted, soft and nondistended, no tenderness MSK: No edema noted, tenderness with palpation of right paraspinal muscles and ribs Skin: No rashes, no diaphoresis Neuro: No focal deficits, muscle strength 5/5 in upper and lower extremities Psych: Alert, Oriented  Labs and Imaging: CBC BMET   Recent Labs Lab 05/15/15 1228  WBC 6.2  HGB 12.6  HCT 37.7  PLT 320    Recent Labs Lab 05/15/15 1228  NA 140  K  3.6  CL 106  CO2 24  BUN 8  CREATININE 0.88  GLUCOSE 108*  CALCIUM 9.4    - Troponin negative - D Dimer normal  Dg Chest 2 View  05/15/2015  CLINICAL DATA:  Chest pain EXAM: CHEST  2 VIEW COMPARISON:  08/23/2014 FINDINGS: The heart size and mediastinal contours are within normal limits. Both lungs are clear. The visualized skeletal structures are unremarkable. IMPRESSION: No active cardiopulmonary disease. Electronically Signed   By: Rolm Baptise M.D.   On: 05/15/2015 13:46   Lorna Few, DO 05/15/2015, 8:19 PM PGY-2, [place] [facility] pager: [phone], text pages welcome

## 2015-05-15 NOTE — ED Provider Notes (Addendum)
CSN: [id number]     Arrival date & time 05/15/15  1214 History   First MD Initiated Contact with Patient 05/15/15 1512     Chief Complaint  Patient presents with  . Chest Pain     (Consider location/radiation/quality/duration/timing/severity/associated sxs/prior Treatment) Patient is a 55 y.o. female presenting with chest pain.  Chest Pain Pain location:  Substernal area Pain quality: pressure and sharp   Pain radiates to:  Upper back Pain radiates to the back: yes   Pain severity:  Moderate Onset quality:  Sudden Duration:  4 hours Timing:  Constant Progression:  Improving Chronicity:  New Context: at rest   Relieved by:  None tried (self improving) Worsened by:  Nothing tried Associated symptoms: heartburn and nausea   Associated symptoms: no abdominal pain, no anxiety, no back pain, no claudication, no cough, no dizziness, no fatigue, no fever, no headache, no palpitations, no shortness of breath, not vomiting and no weakness   Risk factors: birth control, diabetes mellitus, high cholesterol and obesity   Risk factors: no hypertension, no smoking and no surgery     Past Medical History  Diagnosis Date  . Borderline diabetic   . Weight gain   . Sleep deprivation     loss of sleep  . Fatigue   . Shortness of breath     with pain   . Nausea   . Blurred vision   . Wears glasses   . Arthritis   . Migraine headache   . History of measles, mumps, or rubella   . H/O varicella   . Hx of ovarian cyst   . Yeast infection   . H/O bacterial infection   . Back pain   . Depression   . 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 Anxiety   . Diabetes mellitus without complication ([place])   . History of measles, mumps, or rubella   . Wears glasses    Past Surgical History  Procedure Laterality Date  . Abdominal hysterectomy    . Oophorectomy  2009  . Cholecystectomy  11/13/10  . Dilation and curettage of uterus     . Tubal ligation    . Dg gall bladder     Family History  Problem Relation Age of Onset  . Hypertension Mother   . Hypotension Mother   . Anemia Mother     low iron  . Heart disease Sister   . Sickle cell trait Other   . Alcohol abuse Brother   . Alcohol abuse Brother   . Drug abuse Brother    Social History  Substance Use Topics  . Smoking status: Former Smoker -- 0.50 packs/day    Types: Cigarettes  . Smokeless tobacco: Never Used  . Alcohol Use: 0.0 oz/week    0 Standard drinks or equivalent per week   OB History    Gravida Para Term Preterm AB TAB SAB Ectopic Multiple Living   3 2 2  1     2      Review of Systems  Constitutional: Negative for fever, chills, appetite change and fatigue.  HENT: Negative for congestion, ear pain, facial swelling, mouth sores and sore throat.   Eyes: Negative for visual disturbance.  Respiratory: Negative for cough, chest tightness and shortness of breath.   Cardiovascular: Positive for chest pain. Negative for palpitations and claudication.  Gastrointestinal: Positive for heartburn and nausea. Negative for vomiting, abdominal pain, diarrhea and blood  in stool.  Endocrine: Negative for cold intolerance and heat intolerance.  Genitourinary: Negative for frequency, decreased urine volume and difficulty urinating.  Musculoskeletal: Negative for back pain and neck stiffness.  Skin: Negative for rash.  Neurological: Negative for dizziness, weakness, light-headedness and headaches.  All other systems reviewed and are negative.     Allergies  Sulfa antibiotics  Home Medications   Prior to Admission medications   Medication Sig Start Date End Date Taking? Authorizing Provider  aspirin-acetaminophen-caffeine (EXCEDRIN MIGRAINE) [phone] MG tablet Take 1 tablet by mouth daily as needed for headache or migraine.   Yes Historical Provider, MD  canagliflozin (INVOKANA) 100 MG TABS tablet Take 1 tablet (100 mg total) by mouth  daily. Patient taking differently: Take 50 mg by mouth daily before breakfast.  04/01/15  Yes Philemon Kingdom, MD  estradiol (ESTRACE) 1 MG tablet Take 1 tablet (1 mg total) by mouth 2 (two) times daily. 04/29/15  Yes Brook E Yisroel Ramming, MD  HYDROcodone-acetaminophen ([place]) 7.5-325 MG per tablet Take 1 tablet by mouth daily as needed for moderate pain.   Yes Historical Provider, MD  meloxicam (MOBIC) 7.5 MG tablet Take 7.5 mg by mouth 2 (two) times daily.    Yes Historical Provider, MD  metFORMIN (GLUCOPHAGE-XR) 500 MG 24 hr tablet Take 1 tablet (500 mg total) by mouth 2 (two) times daily with a meal. 02/03/15  Yes Philemon Kingdom, MD  mirabegron ER (MYRBETRIQ) 25 MG TB24 tablet Take 1 tablet (25 mg total) by mouth daily. 04/29/15  Yes [place], MD  Pitavastatin Calcium (LIVALO) 2 MG TABS Take 2 mg by mouth daily.   Yes Historical Provider, MD  glucose blood (BAYER CONTOUR TEST) test strip Use 2x a day 02/03/15   Philemon Kingdom, MD  lansoprazole (PREVACID SOLUTAB) 15 MG disintegrating tablet Take 1 tablet (15 mg total) by mouth daily at 12 noon. 05/13/15   [name], MD   BP 93/80 mmHg  Pulse 77  Temp(Src) 97.8 F (36.6 C) (Oral)  Resp 17  SpO2 98% Physical Exam  Constitutional: She is oriented to person, place, and time. She appears well-developed and well-nourished. No distress.  HENT:  Head: Normocephalic and atraumatic.  Eyes: EOM are normal. Pupils are equal, round, and reactive to light.  Neck: Normal range of motion. Neck supple.  Cardiovascular: Normal rate and regular rhythm.  Exam reveals no gallop and no friction rub.   No murmur heard. Pulmonary/Chest: Effort normal. She has no wheezes. She has no rales.  Abdominal: Soft. She exhibits no distension. There is no tenderness. There is no rebound and no guarding.  Musculoskeletal: She exhibits no edema or tenderness.  Neurological: She is alert and oriented to person, place, and time.  Skin: Skin is  warm and dry. She is not diaphoretic.  Psychiatric: She has a normal mood and affect. Her behavior is normal.  Nursing note and vitals reviewed.   ED Course  Procedures (including critical care time) Labs Review Labs Reviewed  BASIC METABOLIC PANEL - Abnormal; Notable for the following:    Glucose, Bld 108 (*)    All other components within normal limits  CBC  D-DIMER, QUANTITATIVE (NOT AT [facility])  LIPASE, BLOOD  I-STAT TROPOININ, ED  Randolm Idol, ED    Imaging Review Dg Chest 2 View  05/15/2015  CLINICAL DATA:  Chest pain EXAM: CHEST  2 VIEW COMPARISON:  08/23/2014 FINDINGS: The heart size and mediastinal contours are within normal limits. Both lungs are  clear. The visualized skeletal structures are unremarkable. IMPRESSION: No active cardiopulmonary disease. Electronically Signed   By: Rolm Baptise M.D.   On: 05/15/2015 13:46   I have personally reviewed and evaluated these images and lab results as part of my medical decision-making.   EKG Interpretation   Date/Time:  Sunday May 15 2015 12:24:08 EST Ventricular Rate:  62 PR Interval:  166 QRS Duration: 74 QT Interval:  384 QTC Calculation: 389 R Axis:   58 Text Interpretation:  Normal sinus rhythm Normal ECG No significant change  since last tracing Confirmed by [facility] MD, ERIN ([id number]) on 05/15/2015  2:29:56 PM      MDM   Acute sternal chest pain with radiation to the back. Patient is not hypertensive and has equal bilateral radial pulses. Chest x-ray without evidence to suggest dissection. Chest x-ray also negative for pneumonia, pneumothorax, pneumomediastinum, pleural effusions, pulmonary edema. EKG with normal sinus rhythm, normal intervals, axis. No evidence of acute ischemia, arrhythmia, or blocks. Patient is a HEAR of 4. She is a 55 year old female on hormone replacement therapy and unable to be PERC'd out. D-dimer negative.  Patient does have a significant history of GERD and reports that she's been  having increased heartburn recently. However given her increased risk for ACS, we discussed options with patient to be admitted for rule out ACS versus close follow-up with PCP. Patient chest to be admitted for rule out and have a stress test in the morning.  Patient admitted to family medicine for further management.   Diagnostic studies interpreted by me and use to my clinical decision-making.  Patient seen in conjunction with Dr. Tyrone Nine.  Final diagnoses:  Chest pain, unspecified chest pain type        Addison Lank, MD 05/15/15 2133  Deno Etienne, DO 05/15/15 [place], DO 07/20/15 [id number]

## 2015-05-15 NOTE — ED Notes (Signed)
Pt given sprite to drink. 

## 2015-05-15 NOTE — ED Notes (Signed)
Tray ordered for pt.

## 2015-05-15 NOTE — ED Notes (Signed)
Phlebotomy called to obtain labs

## 2015-05-15 NOTE — ED Notes (Signed)
Pt from home with c/o sudden onset substernal chest pain radiating to left upper back starting just before noon today.  Pt reports severe SOB at onset.  Denies N/V or other symptoms.  NAD, A&O.

## 2015-05-15 NOTE — ED Notes (Signed)
Pt reports discussing indigestion with her PCP this week that has been ongoing for approx a month.  Pt was seen by EMS and has several EKG strips as well as given 324 mg aspirin.

## 2015-05-16 ENCOUNTER — Other Ambulatory Visit: Payer: Self-pay

## 2015-05-16 ENCOUNTER — Telehealth: Payer: Self-pay | Admitting: Family Medicine

## 2015-05-16 ENCOUNTER — Encounter (HOSPITAL_COMMUNITY): Payer: Self-pay | Admitting: Cardiology

## 2015-05-16 DIAGNOSIS — N3281 Overactive bladder: Secondary | ICD-10-CM | POA: Diagnosis not present

## 2015-05-16 DIAGNOSIS — R072 Precordial pain: Secondary | ICD-10-CM | POA: Diagnosis not present

## 2015-05-16 DIAGNOSIS — E559 Vitamin D deficiency, unspecified: Secondary | ICD-10-CM | POA: Diagnosis not present

## 2015-05-16 DIAGNOSIS — E119 Type 2 diabetes mellitus without complications: Secondary | ICD-10-CM | POA: Diagnosis not present

## 2015-05-16 DIAGNOSIS — R079 Chest pain, unspecified: Secondary | ICD-10-CM | POA: Diagnosis not present

## 2015-05-16 DIAGNOSIS — E669 Obesity, unspecified: Secondary | ICD-10-CM | POA: Diagnosis not present

## 2015-05-16 DIAGNOSIS — R0789 Other chest pain: Secondary | ICD-10-CM | POA: Diagnosis not present

## 2015-05-16 DIAGNOSIS — Z6833 Body mass index (BMI) 33.0-33.9, adult: Secondary | ICD-10-CM | POA: Diagnosis not present

## 2015-05-16 DIAGNOSIS — M1990 Unspecified osteoarthritis, unspecified site: Secondary | ICD-10-CM | POA: Diagnosis not present

## 2015-05-16 DIAGNOSIS — E7800 Pure hypercholesterolemia, unspecified: Secondary | ICD-10-CM | POA: Diagnosis not present

## 2015-05-16 DIAGNOSIS — R0602 Shortness of breath: Secondary | ICD-10-CM | POA: Diagnosis not present

## 2015-05-16 LAB — GLUCOSE, CAPILLARY
Glucose-Capillary: 93 mg/dL (ref 65–99)
Glucose-Capillary: 89 mg/dL (ref 65–99)
Glucose-Capillary: 98 mg/dL (ref 65–99)
Glucose-Capillary: 101 mg/dL — ABNORMAL HIGH (ref 65–99)

## 2015-05-16 LAB — TROPONIN I
Troponin I: <0.03 ng/mL (ref <0.031)
Troponin I: <0.03 ng/mL (ref <0.031)
Troponin I: <0.03 ng/mL (ref <0.031)

## 2015-05-16 LAB — MRSA PCR SCREENING: MRSA by PCR: POSITIVE — AB

## 2015-05-16 MED ORDER — BLISTEX MEDICATED EX OINT
TOPICAL_OINTMENT | CUTANEOUS | Status: DC | PRN
  Filled 2015-05-16: qty 10

## 2015-05-16 MED ORDER — VITAMIN D (ERGOCALCIFEROL) 1.25 MG (50000 UNIT) PO CAPS: 50000.0000 [IU] | ORAL_CAPSULE | ORAL | Status: DC

## 2015-05-16 MED ORDER — LIP MEDEX EX OINT
TOPICAL_OINTMENT | CUTANEOUS | Status: DC | PRN
  Filled 2015-05-16 (×2): qty 7

## 2015-05-16 MED FILL — VIT D2 1.25 MG (50,000 UNIT: 1.25 MG | 28 days supply | Qty: 4 | Fill #0

## 2015-05-16 NOTE — Progress Notes (Signed)
Family Medicine Teaching Service Daily Progress Note Intern Pager: [phone]  Patient name: [name] Medical record number: [id number] Date of birth: [birth year] Age: 55 y.o. Gender: female  Primary Care Provider: Andrena Mews, MD Consultants: cards Code Status: FULL  Pt Overview and Major Events to Date:  2/26: admit for chest pain eval  Assessment and Plan: [name] is a 55 y.o. female presenting with chest pain. PMH is significant for GERD, DM2, Depression/Anxiety, HLD.  # Chest Pain: With typical and atypical features.  Reassuring that reproducible on exam. Troponin negative x2. D Dimer negative at 0.27. Hx of GERD. CXR with no active cardiopulmonary disease. AM EKG no significant change.   - NPO - Cardiac monitor - Trend Troponins: <0.03 x 3 - Nitro PRN - Aspirin 325mg  - Tylenol PRN mild pain, Morphine PRN moderate to severe pain - Zofran PRN nausea/vomiting - Protonix - cardiology consulted, appreciate recs - Consider sleep apnea workup at discharge  # Diabetes: Invokana 50mg  (note prescribed 100mg  daily), Metformin 500mg  BID. A1c 6.3 (04/26/15) CBGs 93-114, AM 93 - Hold home medication - CBG monitoring q 4 hours  - currently NPO  # Vitamin D Deficiency: Vitamin D level of 9 noted.  - Consider weekly treatment of Vitamin D at discharge  # Overactive Bladder: Currently on Myrbetriq - Continue home medication  FEN/GI: NPO Prophylaxis: Lovenox  Disposition: Pending Chest Pain rule out   Subjective:  Reports she had some right sided chest discomfort (dull) around 4-5 AM. Did not feel similar to prior chest pain. Felt hot during this episode. No sob or nausea. Given Nitro which resolved the pain. No chest pain currently. Notes of Left sided achy HA; states this also occurred when she received nitro in the ED. Tylenol eases pain.   Objective: Temp:  [97.8 F (36.6 C)-98.4 F (36.9 C)] 98.4 F (36.9 C) (02/27 0413) Pulse Rate:  [55-77] 64 (02/27  0549) Resp:  [10-23] 18 (02/27 0413) BP: (93-129)/(58-94) 106/69 mmHg (02/27 0549) SpO2:  [97 %-100 %] 100 % (02/27 0549) Weight:  [92.307 kg (203 lb 8 oz)] 92.307 kg (203 lb 8 oz) (02/26 2200) Physical Exam: General: NAD Cardiovascular: RRR. No m/r/g Chest: tenderness to palpation of Right upper sternal border (reports this is different pain from chest pain) Respiratory: CTAB, normal effort Abdomen: + BS, soft, NT, ND Extremities: no edema or calf tenderness in LE  Laboratory:  Recent Labs Lab 05/13/15 0914 05/15/15 1228  WBC 6.0 6.2  HGB 12.7 12.6  HCT 37.5 37.7  PLT 346 320    Recent Labs Lab 05/15/15 1228  NA 140  K 3.6  CL 106  CO2 24  BUN 8  CREATININE 0.88  CALCIUM 9.4  GLUCOSE 108*    Imaging/Diagnostic Tests: CXR:  FINDINGS: The heart size and mediastinal contours are within normal limits. Both lungs are clear. The visualized skeletal structures are unremarkable.  IMPRESSION: No active cardiopulmonary disease.  Sharon Houseman, MD 05/16/2015, 6:37 AM PGY-1, [place] Intern pager: [phone], text pages welcome

## 2015-05-16 NOTE — Progress Notes (Signed)
Patient reported to RN 5/10 chest pain "just like yesterday"  Patient administered NTG 0.5mg  X1 SL, 02 @2  liters applied ,Morphine 2mg  IV administered, EKG performed.  Patient reassessed 10 mins later, chest pain resolved (0/10)pateint  Complains of headache,Tylenol 650 mg PO administered.  Patient resting comfortably. RN will continue to monitor patient.  Call bell within reach.

## 2015-05-16 NOTE — Consult Note (Signed)
Patient ID: [name] MRN: [id number], DOB/AGE: [birth year]   Admit date: 05/15/2015   Primary Physician: Andrena Mews, MD Primary Cardiologist: New ([place])  Pt. Profile:  55 y/o AA female with h/o DM and HLD but no prior cardiac history, admitted for chest pain.   Problem List  Past Medical History  Diagnosis Date  . Sleep deprivation     loss of sleep  . Arthritis   . Migraine headache   . History of measles, mumps, or rubella   . H/O varicella   . Hx of ovarian cyst   . Depression   . 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 Anxiety   . Diabetes mellitus without complication [facility])     Past Surgical History  Procedure Laterality Date  . Abdominal hysterectomy    . Oophorectomy  2009  . Cholecystectomy  11/13/10  . Dilation and curettage of uterus    . Tubal ligation       Allergies  Allergies  Allergen Reactions  . Sulfa Antibiotics Anaphylaxis, Hives and Swelling    HPI  55 y/o female, admitted for chest pain evaluation. 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 She has no known cardiac history. Her cardiac risk factors are notable for treated DM and HLD. She notes a past history of tobacco use but quit many years ago. No h/o HTN. She denies any family h/o CAD or SCD. Her other PMH is notable for GERD, chronic LBP, migraine HAs and vitamin D deficiency. She recently started going to the gym, exercising on the elliptical ~30 minutes at a time. She denies any exertional chest pain but does get short of breath after 30 min of activity.   She reports symptoms started yesterday morning while attending church. She was in her usual state of health. She did not eat breakfast that morning but did take Ibuprofen on an empty stomach for her LBP, as she knew she would be standing for most of the service. She was standing and singing in the church choir when she developed sudden onset  of severe substernal chest pain that radiated to her back. She noted it felt as though an elephant was sitting on her chest. It was 10/10 in intensity. It did not feel like her usual GERD. She became diaphoretic and mildly short of breath. No dizziness, syncope/ near syncope, n/v.  She ran out of church and was assisted by another church member who is a Therapist, sports. EMS was called. Her CP persisted. She was given 4 baby ASA and symptoms improved but continued. EKG did not show any acute abnormalities. She was transported to the ED. She was given SL NTG that helped some, but she continued to have constant pain over a 1.5-2 hr period total. Her pain finally eased off on its own. This has never happened to her before. She was admitted overnight by IM. Cardiac enzymes are negative x 3. D-dimer and lipase are also negative. CXR unremarkable. EKG shows NSR w/o any ischemic abnormalities. CBC and BMP are WNL. BP is well controlled without meds. She notes she had mild recurrence of chest discomfort overnight x 1 that resolved with SL NTG. She has since remained CP free.     Home Medications  Prior to Admission medications   Medication Sig Start Date End Date Taking? Authorizing Provider  aspirin-acetaminophen-caffeine (EXCEDRIN MIGRAINE) [phone] MG tablet Take 1 tablet by mouth daily  as needed for headache or migraine.   Yes Historical Provider, MD  canagliflozin (INVOKANA) 100 MG TABS tablet Take 1 tablet (100 mg total) by mouth daily. Patient taking differently: Take 50 mg by mouth daily before breakfast.  04/01/15  Yes Philemon Kingdom, MD  estradiol (ESTRACE) 1 MG tablet Take 1 tablet (1 mg total) by mouth 2 (two) times daily. 04/29/15  Yes Brook E Yisroel Ramming, MD  HYDROcodone-acetaminophen ([place]) 7.5-325 MG per tablet Take 1 tablet by mouth daily as needed for moderate pain.   Yes Historical Provider, MD  meloxicam (MOBIC) 7.5 MG tablet Take 7.5 mg by mouth 2 (two) times daily.    Yes Historical Provider, MD   metFORMIN (GLUCOPHAGE-XR) 500 MG 24 hr tablet Take 1 tablet (500 mg total) by mouth 2 (two) times daily with a meal. 02/03/15  Yes Philemon Kingdom, MD  mirabegron ER (MYRBETRIQ) 25 MG TB24 tablet Take 1 tablet (25 mg total) by mouth daily. 04/29/15  Yes [place], MD  Pitavastatin Calcium (LIVALO) 2 MG TABS Take 2 mg by mouth daily.   Yes Historical Provider, MD  glucose blood (BAYER CONTOUR TEST) test strip Use 2x a day 02/03/15   Philemon Kingdom, MD  lansoprazole (PREVACID SOLUTAB) 15 MG disintegrating tablet Take 1 tablet (15 mg total) by mouth daily at 12 noon. 05/13/15   [name], MD  Vitamin D, Ergocalciferol, (DRISDOL) 50000 units CAPS capsule Take 1 capsule (50,000 Units total) by mouth every 7 (seven) days. 05/16/15   [name], MD    Family History  Family History  Problem Relation Age of Onset  . Hypertension Mother   . Hypotension Mother   . Anemia Mother     low iron  . Heart disease Sister   . Sickle cell trait Other   . Alcohol abuse Brother   . Alcohol abuse Brother   . Drug abuse Brother     Social History  Social History   Social History  . Marital Status: Married    Spouse Name: N/A  . Number of Children: N/A  . Years of Education: N/A   Occupational History  . Not on file.   Social History Main Topics  . Smoking status: Former Smoker -- 0.50 packs/day    Types: Cigarettes  . Smokeless tobacco: Never Used  . Alcohol Use: 0.0 oz/week    0 Standard drinks or equivalent per week  . Drug Use: No  . Sexual Activity:    Partners: Male    Birth Control/ Protection: Surgical     Comment: Hysterectomy   Other Topics Concern  . Not on file   Social History Narrative   Patient lives at home with her husband [name]). She works for [employer] and she has her associates degree. Two children.     Review of Systems General:  No chills, fever, night sweats or weight changes.  Cardiovascular:  No chest pain, dyspnea on  exertion, edema, orthopnea, palpitations, paroxysmal nocturnal dyspnea. Dermatological: No rash, lesions/masses Respiratory: No cough, dyspnea Urologic: No hematuria, dysuria Abdominal:   No nausea, vomiting, diarrhea, bright red blood per rectum, melena, or hematemesis Neurologic:  No visual changes, wkns, changes in mental status. All other systems reviewed and are otherwise negative except as noted above.  Physical Exam  Blood pressure 106/59, pulse 59, temperature 97.7 F (36.5 C), temperature source Oral, resp. rate 18, height 5\' 5"  (1.651 m), weight 203 lb 8 oz (92.307 kg), SpO2 100 %.  General: Pleasant, NAD Psych: Normal affect. Neuro: Alert and oriented X 3. Moves all extremities spontaneously. HEENT: Normal  Neck: Supple without bruits or JVD. Lungs:  Resp regular and unlabored, CTA. Heart: RRR no s3, s4, or murmurs. Abdomen: Soft, non-tender, non-distended, BS + x 4.  Extremities: No clubbing, cyanosis or edema. DP/PT/Radials 2+ and equal bilaterally.  Labs  Troponin [facility] Test)  Recent Labs  05/15/15 1725  TROPIPOC 0.00    Recent Labs  05/16/15 0034 05/16/15 0236 05/16/15 0542  TROPONINI <0.03 <0.03 <0.03   Lab Results  Component Value Date   WBC 6.2 05/15/2015   HGB 12.6 05/15/2015   HCT 37.7 05/15/2015   MCV 85.9 05/15/2015   PLT 320 05/15/2015     Recent Labs Lab 05/15/15 1228  NA 140  K 3.6  CL 106  CO2 24  BUN 8  CREATININE 0.88  CALCIUM 9.4  GLUCOSE 108*   Lab Results  Component Value Date   CHOL 179 04/26/2015   HDL 42.60 04/26/2015   LDLCALC 123* 04/26/2015   TRIG 66.0 04/26/2015   Lab Results  Component Value Date   DDIMER <0.27 05/15/2015     Radiology/Studies  Dg Chest 2 View  05/15/2015  CLINICAL DATA:  Chest pain EXAM: CHEST  2 VIEW COMPARISON:  08/23/2014 FINDINGS: The heart size and mediastinal contours are within normal limits. Both lungs are clear. The visualized skeletal structures are unremarkable.  IMPRESSION: No active cardiopulmonary disease. Electronically Signed   By: Rolm Baptise M.D.   On: 05/15/2015 13:46    ECG  NSR. No ischemic abnormalities.    ASSESSMENT AND PLAN  Active Problems:   Chest pain  1. Chest Pain: patient with mixed typical and atypical features but has several cardiac risk factors including h/o DM, HLD and remote h/o tobacco use. She had a prolonged episode of resting substernal chest pain over a 1.5-2hr time frame. Cardiac enzymes are negative x 3. D-dimer, lipase and CXR all normal. EKG shows NSR w/o ischemic abnormalities. She is now CP free. Given her risk factors, recommended NST for risk stratification. We might be able to complete as an outpatient. Will discuss with MD regarding plan.    Ronnell Gu[place], PA-C 05/16/2015, 2:28 PM   History and all data above reviewed.  Patient examined.  I agree with the findings as above.  The patient had heavy substernal chest pain.  She does have diabetes but no objective evidence of ischemia.  Currently pain free  The patient exam reveals COR:RRR  ,  Lungs: Clear  ,  Abd: Positive bowel sounds, no rebound no guarding, Ext No edema  .  All available labs, radiology testing, previous records reviewed. Agree with documented assessment and plan. Chest pain:  Some typical features.  10 years of diabetes.  Need to screen for obstructive CAD.  I will try to get a coronary CTA ordered for today.    Jeneen Rinks Kierra Jezewski  2:28 PM  05/16/2015

## 2015-05-16 NOTE — Telephone Encounter (Signed)
I got a message patient was admitted yesterday for chest pain. I called her hospital bed to speak with her. She stated she is feeling better with her chest. So far all test done had been negative. She is scheduled for cardiology assessment and possible stress test today. I discussed her lab result from Friday with her; TSH and Vitamib B12 are normal. Vitamin D is low at 9 which could be contributing to her fatigue. As discussed with her I have sent a prescription for Vitamin D to her pharmacy for pick up. I will see her once she is discharged from the hospital for follow-up. Will plan on rechecking Vit D level in 8 wks after treatment initiation.  I appreciate care provided to my patient by the FMT[facility] team.

## 2015-05-16 NOTE — Discharge Summary (Signed)
[facility] Discharge Summary  Patient name: [name] Medical record number: [id number] Date of birth: 1961-03-10 Age: 55 y.o. Gender: female Date of Admission: 05/15/2015  Date of Discharge: 05/17/15 Admitting Physician: Leeanne Rio, MD  Primary Care Provider: Andrena Mews, MD Consultants: Cardiology   Indication for Hospitalization: chest pain   Discharge Diagnoses/Problem List:  Chest Pain Diabetes Vitamin D Deficiency  Overactive Bladder  Disposition: home   Discharge Condition: improved and stable   Discharge Exam:  Please see progress note from day of discharge  Brief Hospital Course:   Chest Pain:  Patient presented with sudden onset substernal chest pain radiating to left upper back that lasted for several hours on the day of admission with associated severe shortness of breath and diaphoresis. Pain and shortness of breath worsened with exertion, improved with rest. Pain was characterized as very sharp and goes through to back. Admitted to picking up her large grandchildren the day prior to presentation; husband was concerned that she might have pulled a muscle yesterday. On exam, patient did have some tenderness to palpation of chest wall. EKGs showed no acute changes. Patient's troponins were negative x 3. D-dimer was obtained and was wnl at 0.27. CXR showed no active cardiopulmonary disease. Patient was monitored on telemetry. Cardiology was consulted; CT coronary arteries showed a calcium score of zero; it also showed incidental finding of pulmonary nodule (noted below). Patient was stable for discharge.    Issues for Follow Up:  - consider taper of estradiol if risks exceed benefits for patient  - husband reports she sometimes appears to stop breathing at night. Consider sleep study to evaluate for OSA - consider follow up imaging for pulmonary nodule found on imaging (noted below)   Significant Procedures: CTA coronary  arteries (noted below)   Significant Labs and Imaging:   Recent Labs Lab 05/13/15 0914 05/15/15 1228  WBC 6.0 6.2  HGB 12.7 12.6  HCT 37.5 37.7  PLT 346 320    Recent Labs Lab 05/15/15 1228  NA 140  K 3.6  CL 106  CO2 24  GLUCOSE 108*  BUN 8  CREATININE 0.88  CALCIUM 9.4   Lipase: 20 D-dimer: <0.27  CXR: FINDINGS: The heart size and mediastinal contours are within normal limits. Both lungs are clear. The visualized skeletal structures are unremarkable.  IMPRESSION: No active cardiopulmonary disease.  CT coronary arteries: IMPRESSION: 1) Normal right dominant coronary arteries 2) Coronary calcium score 0 3) RML nodule see radiology report IMPRESSION: 4 mm right middle lobe pulmonary nodule.If the patient is at high risk for bronchogenic carcinoma, follow-up chest CT at 1 year is recommended. If the patient is at low risk, no follow-up is needed. This recommendation follows the consensus statement: Guidelines for Management of Small Pulmonary Nodules Detected on CT Scans: A Statement from the [employer] as published in Radiology 2005; 237:395-400.  Results/Tests Pending at Time of Discharge: none  Discharge Medications:    Medication List    ASK your doctor about these medications        aspirin-acetaminophen-caffeine 250-250-65 MG tablet  Commonly known as:  EXCEDRIN MIGRAINE  Take 1 tablet by mouth daily as needed for headache or migraine.     canagliflozin 100 MG Tabs tablet  Commonly known as:  INVOKANA  Take 1 tablet (100 mg total) by mouth daily.     estradiol 1 MG tablet  Commonly known as:  ESTRACE  Take 1 tablet (1 mg total) by mouth 2 (  two) times daily.     glucose blood test strip  Commonly known as:  BAYER CONTOUR TEST  Use 2x a day     HYDROcodone-acetaminophen 7.5-325 MG tablet  Commonly known as:  NORCO  Take 1 tablet by mouth daily as needed for moderate pain.     lansoprazole 15 MG disintegrating tablet  Commonly  known as:  PREVACID SOLUTAB  Take 1 tablet (15 mg total) by mouth daily at 12 noon.     LIVALO 2 MG Tabs  Generic drug:  Pitavastatin Calcium  Take 2 mg by mouth daily.     meloxicam 7.5 MG tablet  Commonly known as:  MOBIC  Take 7.5 mg by mouth 2 (two) times daily.     metFORMIN 500 MG 24 hr tablet  Commonly known as:  GLUCOPHAGE-XR  Take 1 tablet (500 mg total) by mouth 2 (two) times daily with a meal.     mirabegron ER 25 MG Tb24 tablet  Commonly known as:  MYRBETRIQ  Take 1 tablet (25 mg total) by mouth daily.     Vitamin D (Ergocalciferol) 50000 units Caps capsule  Commonly known as:  DRISDOL  Take 1 capsule (50,000 Units total) by mouth every 7 (seven) days.        Discharge Instructions: Please refer to Patient Instructions section of EMR for full details.  Patient was counseled important signs and symptoms that should prompt return to medical care, changes in medications, dietary instructions, activity restrictions, and follow up appointments.   Follow-Up Appointments:   Follow-up Information    Follow up with Andrena Mews, MD On 05/27/2015.   Specialty:  Family Medicine   Why:  at 8:45 for hospital follow up    Contact information:   [place] [address] [phone]       Smiley Houseman, MD 05/16/2015, 12:52 PM PGY-1, [place]

## 2015-05-17 ENCOUNTER — Observation Stay (HOSPITAL_COMMUNITY): Payer: 59

## 2015-05-17 ENCOUNTER — Encounter (HOSPITAL_COMMUNITY): Payer: Self-pay | Admitting: Radiology

## 2015-05-17 DIAGNOSIS — R0602 Shortness of breath: Secondary | ICD-10-CM | POA: Diagnosis not present

## 2015-05-17 DIAGNOSIS — R072 Precordial pain: Secondary | ICD-10-CM | POA: Diagnosis not present

## 2015-05-17 DIAGNOSIS — R079 Chest pain, unspecified: Secondary | ICD-10-CM | POA: Diagnosis not present

## 2015-05-17 DIAGNOSIS — E7800 Pure hypercholesterolemia, unspecified: Secondary | ICD-10-CM | POA: Diagnosis not present

## 2015-05-17 DIAGNOSIS — E669 Obesity, unspecified: Secondary | ICD-10-CM | POA: Diagnosis not present

## 2015-05-17 DIAGNOSIS — N3281 Overactive bladder: Secondary | ICD-10-CM | POA: Diagnosis not present

## 2015-05-17 DIAGNOSIS — E119 Type 2 diabetes mellitus without complications: Secondary | ICD-10-CM | POA: Diagnosis not present

## 2015-05-17 DIAGNOSIS — M1990 Unspecified osteoarthritis, unspecified site: Secondary | ICD-10-CM | POA: Diagnosis not present

## 2015-05-17 DIAGNOSIS — E559 Vitamin D deficiency, unspecified: Secondary | ICD-10-CM | POA: Diagnosis not present

## 2015-05-17 DIAGNOSIS — Z6833 Body mass index (BMI) 33.0-33.9, adult: Secondary | ICD-10-CM | POA: Diagnosis not present

## 2015-05-17 LAB — BASIC METABOLIC PANEL
Anion gap: 9 (ref 5–15)
BUN: 14 mg/dL (ref 6–20)
CO2: 25 mmol/L (ref 22–32)
Calcium: 9.1 mg/dL (ref 8.9–10.3)
Chloride: 108 mmol/L (ref 101–111)
Creatinine, Ser: 0.94 mg/dL (ref 0.44–1.00)
GFR calc Af Amer: >60 mL/min (ref >60)
GFR calc non Af Amer: >60 mL/min (ref >60)
Glucose, Bld: 111 mg/dL — ABNORMAL HIGH (ref 65–99)
Potassium: 4.2 mmol/L (ref 3.5–5.1)
Sodium: 142 mmol/L (ref 135–145)

## 2015-05-17 LAB — GLUCOSE, CAPILLARY
Glucose-Capillary: 106 mg/dL — ABNORMAL HIGH (ref 65–99)
Glucose-Capillary: 79 mg/dL (ref 65–99)

## 2015-05-17 MED ORDER — METOPROLOL TARTRATE 1 MG/ML IV SOLN
INTRAVENOUS | Status: AC
  Administered 2015-05-17: 5 mg
  Administered 2015-05-17: 2.5 mg
  Filled 2015-05-17: qty 5

## 2015-05-17 MED ORDER — NITROGLYCERIN 0.4 MG SL SUBL
SUBLINGUAL_TABLET | SUBLINGUAL | Status: DC
Start: 2015-05-17 — End: 2015-05-17
  Filled 2015-05-17: qty 1

## 2015-05-17 MED ORDER — IOHEXOL 350 MG/ML SOLN
80.0000 mL | Freq: Once | INTRAVENOUS | Status: AC | PRN
  Administered 2015-05-17: 80 mL via INTRAVENOUS

## 2015-05-17 NOTE — Progress Notes (Signed)
05/17/2015 4:10 PM Discharge AVS meds taken today and those due this evening reviewed.  Follow-up appointments and when to call md reviewed.  D/C IV and TELE.  Questions and concerns addressed.   D/C home per orders. [place]

## 2015-05-17 NOTE — Progress Notes (Signed)
    SUBJECTIVE:  Slight chest pain last night but none today.     PHYSICAL EXAM Filed Vitals:   05/16/15 0549 05/16/15 1016 05/16/15 1537 05/16/15 2022  BP: 106/69 106/59 124/83 109/74  Pulse: 64 59 61 66  Temp:  97.7 F (36.5 C) 97.8 F (36.6 C) 98.3 F (36.8 C)  TempSrc:  Oral Oral Oral  Resp:   18 18  Height:      Weight:      SpO2: 100% 100% 100% 100%   LABS: Lab Results  Component Value Date   TROPONINI <0.03 05/16/2015   Results for orders placed or performed during the hospital encounter of 05/15/15 (from the past 24 hour(s))  Glucose, capillary     Status: None   Collection Time: 05/16/15 11:38 AM  Result Value Ref Range   Glucose-Capillary 89 65 - 99 mg/dL  Glucose, capillary     Status: None   Collection Time: 05/16/15  5:01 PM  Result Value Ref Range   Glucose-Capillary 98 65 - 99 mg/dL   Comment 1 Notify RN    Comment 2 Document in Chart   Glucose, capillary     Status: Abnormal   Collection Time: 05/16/15  8:22 PM  Result Value Ref Range   Glucose-Capillary 101 (H) 65 - 99 mg/dL   Comment 1 Notify RN    Comment 2 Document in Chart   Glucose, capillary     Status: Abnormal   Collection Time: 05/17/15  4:08 AM  Result Value Ref Range   Glucose-Capillary 106 (H) 65 - 99 mg/dL  Basic metabolic panel     Status: Abnormal   Collection Time: 05/17/15  5:10 AM  Result Value Ref Range   Sodium 142 135 - 145 mmol/L   Potassium 4.2 3.5 - 5.1 mmol/L   Chloride 108 101 - 111 mmol/L   CO2 25 22 - 32 mmol/L   Glucose, Bld 111 (H) 65 - 99 mg/dL   BUN 14 6 - 20 mg/dL   Creatinine, Ser 0.94 0.44 - 1.00 mg/dL   Calcium 9.1 8.9 - 10.3 mg/dL   GFR calc non Af Amer >60 >60 mL/min   GFR calc Af Amer >60 >60 mL/min   Anion gap 9 5 - 15    Intake/Output Summary (Last 24 hours) at 05/17/15 1110 Last data filed at 05/16/15 1800  Gross per 24 hour  Intake      0 ml  Output      0 ml  Net      0 ml     ASSESSMENT AND PLAN:  CHEST PAIN:  Normal coronaries with  zero calcium on CT.  No further cardiac work up.    Jeneen Rinks [facility] 05/17/2015 11:10 AM

## 2015-05-17 NOTE — Discharge Instructions (Signed)
You were hospitalized for chest pain. Your EKG and heart enzymes were within normal limits. Cardiology saw you and completed imaging of the heart vessels which was within normal limits.

## 2015-05-17 NOTE — Progress Notes (Signed)
Family Medicine Teaching Service Daily Progress Note Intern Pager: [phone]  Patient name: [name] Medical record number: [id number] Date of birth: 1960/08/13 Age: 55 y.o. Gender: female  Primary Care Provider: Andrena Mews, MD Consultants: cards Code Status: FULL  Pt Overview and Major Events to Date:  2/26: admit for chest pain eval  Assessment and Plan: [name] is a 55 y.o. female presenting with chest pain. PMH is significant for GERD, DM2, Depression/Anxiety, HLD.  # Chest Pain: With typical and atypical features.  Reassuring that reproducible on exam. Troponin negative x3. D Dimer negative at 0.27. Hx of GERD. CXR with no active cardiopulmonary disease. EKGs no significant change.   - Cardiac monitor - Nitro PRN - Aspirin 325mg  - Tylenol PRN mild pain, Morphine PRN moderate to severe pain - Zofran PRN nausea/vomiting - Protonix - cardiology consulted, appreciate recs: CTA coronary arteries ordered  - Consider sleep apnea workup at discharge  # Diabetes: Invokana 50mg  (note prescribed 100mg  daily), Metformin 500mg  BID. A1c 6.3 (04/26/15) CBGs 89-111, AM 111 - NPO after MN (2/28) - Hold home medication - CBG monitoring q 4 hours   # Vitamin D Deficiency: Vitamin D level of 9 noted.  - Consider weekly treatment of Vitamin D at discharge  # Overactive Bladder: Currently on Myrbetriq - Continue home medication  FEN/GI: NPO for imaging per cards Prophylaxis: Lovenox  Disposition: pending evaluation of chest pain  Subjective:  Reports doing well. When asked states she had one episode of chest "achiness" overnight without associated n/v/sob/diaphoresis. She did not tell the nurse about this because she did not want to take nitroglycerin because it gives her a headache. No current chest pain.   Objective: Temp:  [97.7 F (36.5 C)-98.3 F (36.8 C)] 98.3 F (36.8 C) (02/27 2022) Pulse Rate:  [59-66] 66 (02/27 2022) Resp:  [18] 18 (02/27 2022) BP:  (106-124)/(59-83) 109/74 mmHg (02/27 2022) SpO2:  [100 %] 100 % (02/27 2022) Physical Exam: General: NAD Cardiovascular: RRR. No m/r/g Chest: mild tenderness to palpation of Right upper sternal border  Respiratory: CTAB, normal effort Abdomen: + BS, soft, NT, ND Extremities: no edema or calf tenderness in LE  Laboratory:  Recent Labs Lab 05/13/15 0914 05/15/15 1228  WBC 6.0 6.2  HGB 12.7 12.6  HCT 37.5 37.7  PLT 346 320    Recent Labs Lab 05/15/15 1228 05/17/15 0510  NA 140 142  K 3.6 4.2  CL 106 108  CO2 24 25  BUN 8 14  CREATININE 0.88 0.94  CALCIUM 9.4 9.1  GLUCOSE 108* 111*    Imaging/Diagnostic Tests: CXR:  FINDINGS: The heart size and mediastinal contours are within normal limits. Both lungs are clear. The visualized skeletal structures are unremarkable.  IMPRESSION: No active cardiopulmonary disease.  Smiley Houseman, MD 05/17/2015, 6:25 AM PGY-1, [place] Intern pager: [phone], text pages welcome

## 2015-05-19 ENCOUNTER — Ambulatory Visit: Payer: Self-pay | Admitting: *Deleted

## 2015-05-19 ENCOUNTER — Telehealth: Payer: Self-pay | Admitting: Family Medicine

## 2015-05-19 NOTE — Telephone Encounter (Signed)
Will forward to referral coordinator to see if there are any office seeing patient's sooner than this. Jazmin Hartsell,CMA

## 2015-05-19 NOTE — Telephone Encounter (Signed)
[name] won't be able to see anyone at [facility] until 4/25.  She would like to see if there is any other GI practice she can get into earlier or if the provider or nurse could call [facility] to see if she can get in sooner.Marland Kitchen

## 2015-05-23 DIAGNOSIS — R079 Chest pain, unspecified: Secondary | ICD-10-CM | POA: Insufficient documentation

## 2015-05-23 NOTE — Telephone Encounter (Signed)
[facility] unable to get pt in sooner. [facility] and they see her earlier but would need notes from pt's visits with Dr. Carlean Purl from 2008. Will try to make contact with pt tomorrow to advise her we will need her to come in and fill out/sign a ROI to [facility].

## 2015-05-24 ENCOUNTER — Telehealth: Payer: Self-pay | Admitting: *Deleted

## 2015-05-24 NOTE — Telephone Encounter (Signed)
Spoke with patient and she states that she needs to form completed for work to cover her hospital admission dates 05/15/15-05/18/2015.  I also explained to her that we needed to get records from [facility] before we can set her up with [facility].  Will speak with patient on Friday on how to proceed with this referral.  Kiannah Grunow,CMA

## 2015-05-24 NOTE — Telephone Encounter (Signed)
-----   Message from [name], MD sent at 05/24/2015  9:13 AM EST ----- Please check why patient is needing FMLA form to be completed?

## 2015-05-24 NOTE — Telephone Encounter (Signed)
LM for patient to call back.  Please ask her if she calls back the reason for her FMLA, if not we will speak with her at her hospital follow up. Sharina Petre,CMA

## 2015-05-24 NOTE — Telephone Encounter (Signed)
I also called patient, [facility] called me back in regards to pt's GI referral. Mingo Amber states that pt has been discharged from [facility] and cannot be scheduled. Please ask patient how she would like to proceed now with GI referral.

## 2015-05-25 NOTE — Telephone Encounter (Signed)
Patient has chosen to keep appt at [facility].

## 2015-05-25 NOTE — Telephone Encounter (Signed)
Patient is aware that FMLA forms are complete and faxed to [employer].  Forms copied for scanning in patient's record.  Derl Barrow, RN

## 2015-05-25 NOTE — Telephone Encounter (Signed)
Form completed and given to Tamika. 

## 2015-05-27 ENCOUNTER — Encounter: Payer: Self-pay | Admitting: Family Medicine

## 2015-05-27 ENCOUNTER — Ambulatory Visit (INDEPENDENT_AMBULATORY_CARE_PROVIDER_SITE_OTHER): Payer: 59 | Admitting: Family Medicine

## 2015-05-27 VITALS — BP 125/72 | HR 83 | Temp 98.3°F | Wt 201.0 lb

## 2015-05-27 DIAGNOSIS — R079 Chest pain, unspecified: Secondary | ICD-10-CM

## 2015-05-27 DIAGNOSIS — R911 Solitary pulmonary nodule: Secondary | ICD-10-CM

## 2015-05-27 DIAGNOSIS — R5383 Other fatigue: Secondary | ICD-10-CM | POA: Diagnosis not present

## 2015-05-27 DIAGNOSIS — G4730 Sleep apnea, unspecified: Secondary | ICD-10-CM | POA: Insufficient documentation

## 2015-05-27 DIAGNOSIS — N951 Menopausal and female climacteric states: Secondary | ICD-10-CM

## 2015-05-27 DIAGNOSIS — R232 Flushing: Secondary | ICD-10-CM

## 2015-05-27 NOTE — Patient Instructions (Signed)

## 2015-05-27 NOTE — Assessment & Plan Note (Signed)
Likely related to GERD. She had a full cardiac work up which was normal. I recommended continuation of Prevacid. D/C Morbic and use Tylenol prn pain. She has an upcoming GI appointment in few weeks. She is encouraged to keep her appointment.

## 2015-05-27 NOTE — Assessment & Plan Note (Addendum)
Hx of tobacco smoking. Currently asymptomatic. Repeat CT chest in 1 yr for monitoring. She verbalized understanding.

## 2015-05-27 NOTE — Assessment & Plan Note (Signed)
Likely related to poor sleep at night. Referral for sleep study. Graded exercise recommended. Continue Vit D replacement as instructed.

## 2015-05-27 NOTE — Assessment & Plan Note (Signed)
On estrogen prescribed by OB/GYN Side effects of meds and risk discussed. She had hysterectomy done so risk for endometrial cancer low. She is advised to discuss tapering meds with her OB/GYN. She verbalized understanding.

## 2015-05-27 NOTE — Assessment & Plan Note (Signed)
Likely cause of her fatigue. Ambulatory referral for sleep study done. F/U as needed.

## 2015-05-27 NOTE — Progress Notes (Signed)
Subjective:     Patient ID: [name], female   DOB: November 02, 1960, 55 y.o.   MRN: [id number]  HPI Chest pain/GERD: C/O dull central chest pain which is similar to what she had during hospitalization. Pain is triggered by certain food, mostly solid and relived by drinking liquid to push down her food. Denies blood in stool,no stomach pain, no N/V. Apnea/Fatigue: C/O poor sleep at night, her husband also told her she would stop breathing at times while sleeping. She stated she has daytime fatigue. She has been taking her vitamin D supplement for her low vitamin D level of 9. Here for follow up. Pulmonary nodule: Here for follow up with CT chest report from her hospital admission. She denies SOB, no chest pain. She does not smoke currentlt. She smoked 5 stick per day for 20 yrs and she quit in 2006. No other concern. Hot flashes: On Estradiol given by her OB/GYN. She stated she does not function well without medication.  Current Outpatient Prescriptions on File Prior to Visit  Medication Sig Dispense Refill  . canagliflozin (INVOKANA) 100 MG TABS tablet Take 1 tablet (100 mg total) by mouth daily. (Patient taking differently: Take 50 mg by mouth daily before breakfast. ) 30 tablet 2  . estradiol (ESTRACE) 1 MG tablet Take 1 tablet (1 mg total) by mouth 2 (two) times daily. 60 tablet 5  . lansoprazole (PREVACID SOLUTAB) 15 MG disintegrating tablet Take 1 tablet (15 mg total) by mouth daily at 12 noon. 60 tablet 1  . metFORMIN (GLUCOPHAGE-XR) 500 MG 24 hr tablet Take 1 tablet (500 mg total) by mouth 2 (two) times daily with a meal. 60 tablet 2  . mirabegron ER (MYRBETRIQ) 25 MG TB24 tablet Take 1 tablet (25 mg total) by mouth daily. 30 tablet 5  . Pitavastatin Calcium (LIVALO) 2 MG TABS Take 2 mg by mouth daily.    . Vitamin D, Ergocalciferol, (DRISDOL) 50000 units CAPS capsule Take 1 capsule (50,000 Units total) by mouth every 7 (seven) days. 4 capsule 1  . aspirin-acetaminophen-caffeine (EXCEDRIN  MIGRAINE) 250-250-65 MG tablet Take 1 tablet by mouth daily as needed for headache or migraine. Reported on 05/27/2015    . glucose blood (BAYER CONTOUR TEST) test strip Use 2x a day 100 each 11  . meloxicam (MOBIC) 7.5 MG tablet Take 7.5 mg by mouth 2 (two) times daily. Reported on 05/27/2015     No current facility-administered medications on file prior to visit.   Past Medical History  Diagnosis Date  . Sleep deprivation     loss of sleep  . Arthritis   . Migraine headache   . History of measles, mumps, or rubella   . H/O varicella   . Hx of ovarian cyst   . Depression   . 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 Anxiety   . Diabetes mellitus without complication ([place])       Review of Systems  Constitutional: Positive for fatigue.  Respiratory: Negative.   Cardiovascular: Positive for chest pain.  Gastrointestinal: Negative.  Negative for nausea, vomiting, abdominal pain, diarrhea and constipation.  Genitourinary: Negative.   Musculoskeletal: Negative.   All other systems reviewed and are negative.  Filed Vitals:   05/27/15 0853  BP: 125/72  Pulse: 83  Temp: 98.3 F (36.8 C)  TempSrc: Oral  Weight: 201 lb (91.173 kg)        Objective:   Physical Exam  Constitutional: She is oriented to person, place, and time. She appears well-developed. No distress.  Cardiovascular: Normal rate, regular rhythm, normal heart sounds and intact distal pulses.   No murmur heard. Pulmonary/Chest: Effort normal and breath sounds normal. No respiratory distress. She has no wheezes.  Abdominal: Soft. Bowel sounds are normal. She exhibits no distension and no mass. There is no tenderness.  Musculoskeletal: Normal range of motion. She exhibits no edema.  Neurological: She is alert and oriented to person, place, and time.  Nursing note and vitals reviewed.      Assessment:     Chest pain Apnea Fatigue Pulmonary  nodule Hot flashes     Plan:     Check problem list.

## 2015-06-08 ENCOUNTER — Encounter: Payer: Self-pay | Admitting: Internal Medicine

## 2015-06-12 ENCOUNTER — Ambulatory Visit (INDEPENDENT_AMBULATORY_CARE_PROVIDER_SITE_OTHER): Payer: 59 | Admitting: Family Medicine

## 2015-06-12 VITALS — BP 118/78 | HR 96 | Temp 98.0°F | Resp 18 | Ht 66.0 in | Wt 205.8 lb

## 2015-06-12 DIAGNOSIS — J209 Acute bronchitis, unspecified: Secondary | ICD-10-CM

## 2015-06-12 DIAGNOSIS — J111 Influenza due to unidentified influenza virus with other respiratory manifestations: Secondary | ICD-10-CM | POA: Diagnosis not present

## 2015-06-12 MED ORDER — ALBUTEROL SULFATE 108 (90 BASE) MCG/ACT IN AEPB: 2.0000 | INHALATION_SPRAY | Freq: Four times a day (QID) | RESPIRATORY_TRACT | Status: DC | PRN

## 2015-06-12 MED ORDER — AMOXICILLIN-POT CLAVULANATE 875-125 MG PO TABS: 1.0000 | ORAL_TABLET | Freq: Two times a day (BID) | ORAL | Status: DC

## 2015-06-12 MED ORDER — HYDROCODONE-HOMATROPINE 5-1.5 MG/5ML PO SYRP: 5.0000 mL | ORAL_SOLUTION | Freq: Three times a day (TID) | ORAL | Status: DC | PRN

## 2015-06-12 MED ORDER — IBUPROFEN 600 MG PO TABS: 600.0000 mg | ORAL_TABLET | Freq: Three times a day (TID) | ORAL | Status: DC | PRN

## 2015-06-12 NOTE — Patient Instructions (Addendum)
Take the Augmentin twice a day if you are not better by Tuesday.

## 2015-06-12 NOTE — Progress Notes (Signed)
By signing my name below, I, [name], attest that this documentation has been prepared under the direction and in the presence of Sharon Haber, MD. Electronically Signed: Moises [name], [place]. 06/12/2015 , 2:45 PM .  Patient was seen in room 10 .   Patient ID: [name] MRN: [id number], DOB: 1960-11-24, 55 y.o. Date of Encounter: 06/12/2015  Primary Physician: Sharon Mews, MD  Chief Complaint:  Chief Complaint  Patient presents with  . Fatigue    since Wednesday  . Sore Throat    since Wednesday  . Fever    since Wednesday    HPI:  [name] is a 55 y.o. female who presents to [facility] complaining of fatigue with sore throat and fever (tmax 99.8) that started 5 days ago. She's also been having chills, sweats, cough and headache. Her manager went into work 5 days ago with the flu. She denies history of asthma. She received the flu shot this year.   Her sugar has been elevated due to illness.   She works in Public affairs consultant at [employer].   Past Medical History  Diagnosis Date  . Sleep deprivation     loss of sleep  . Arthritis   . Migraine headache   . History of measles, mumps, or rubella   . H/O varicella   . Hx of ovarian cyst   . Depression   . 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 Anxiety   . Diabetes mellitus without complication ([place])      Home Meds: Prior to Admission medications   Medication Sig Start Date End Date Taking? Authorizing Provider  aspirin-acetaminophen-caffeine (EXCEDRIN MIGRAINE) [phone] MG tablet Take 1 tablet by mouth daily as needed for headache or migraine. Reported on 05/27/2015   Yes Historical Provider, MD  canagliflozin (INVOKANA) 100 MG TABS tablet Take 1 tablet (100 mg total) by mouth daily. Patient taking differently: Take 50 mg by mouth daily before breakfast.  04/01/15  Yes Philemon Kingdom, MD  estradiol  (ESTRACE) 1 MG tablet Take 1 tablet (1 mg total) by mouth 2 (two) times daily. 04/29/15  Yes Brook Oletta Lamas, MD  glucose [name] (BAYER CONTOUR TEST) test strip Use 2x a day 02/03/15  Yes Philemon Kingdom, MD  lansoprazole (PREVACID SOLUTAB) 15 MG disintegrating tablet Take 1 tablet (15 mg total) by mouth daily at 12 noon. 05/13/15  Yes [name], MD  meloxicam (MOBIC) 7.5 MG tablet Take 7.5 mg by mouth 2 (two) times daily. Reported on 05/27/2015   Yes Historical Provider, MD  metFORMIN (GLUCOPHAGE-XR) 500 MG 24 hr tablet Take 1 tablet (500 mg total) by mouth 2 (two) times daily with a meal. 02/03/15  Yes Philemon Kingdom, MD  mirabegron ER (MYRBETRIQ) 25 MG TB24 tablet Take 1 tablet (25 mg total) by mouth daily. 04/29/15  Yes Arnold, MD  Pitavastatin Calcium (LIVALO) 2 MG TABS Take 2 mg by mouth daily.   Yes Historical Provider, MD  Vitamin D, Ergocalciferol, (DRISDOL) 50000 units CAPS capsule Take 1 capsule (50,000 Units total) by mouth every 7 (seven) days. 05/16/15  Yes [name], MD    Allergies:  Allergies  Allergen Reactions  . Sulfa Antibiotics Anaphylaxis, Hives and Swelling    Social History   Social History  . Marital Status: Married    Spouse Name: N/A  . Number of Children: N/A  .  Years of Education: N/A   Occupational History  . Not on file.   Social History Main Topics  . Smoking status: Former Smoker -- 0.50 packs/day    Types: Cigarettes  . Smokeless tobacco: Never Used  . Alcohol Use: 0.0 oz/week    0 Standard drinks or equivalent per week  . Drug Use: No  . Sexual Activity:    Partners: Male    Birth Control/ Protection: Surgical     Comment: Hysterectomy   Other Topics Concern  . Not on file   Social History Narrative   Patient lives at home with her husband [name]). She works for [employer] and she has her associates degree. Two children.     Review of Systems:  Constitutional: positive for fatigue, fever,  chills, sweats HEENT: negative for vision changes, hearing loss, congestion, rhinorrhea, epistaxis, or sinus pressure; positive for sore throat Cardiovascular: negative for chest pain or palpitations Respiratory: negative for hemoptysis, wheezing, shortness of breath; positive for cough Abdominal: negative for abdominal pain, vomiting, diarrhea, or constipation; positive for nausea Dermatological: negative for rash Neurologic: negative for dizziness, or syncope; positive for headache All other systems reviewed and are otherwise negative with the exception to those above and in the HPI.  Physical Exam:  [name] pressure 118/78, pulse 96, temperature 98 F (36.7 C), temperature source Oral, resp. rate 18, height 5\' 6"  (1.676 m), weight 205 lb 12.8 oz (93.35 kg), SpO2 98 %., Body mass index is 33.23 kg/(m^2). General: Well developed, well nourished, in no acute distress. Head: Normocephalic, atraumatic, eyes without discharge, sclera non-icteric, nares are without discharge. Bilateral auditory canals clear, TM's are without perforation, pearly grey and translucent with reflective cone of light bilaterally. Oral cavity moist, posterior pharynx without exudate, erythema, peritonsillar abscess, or post nasal drip.  Neck: Supple. No thyromegaly. Full ROM. No lymphadenopathy. Lungs: Clear bilaterally to auscultation without wheezes, rales, or rhonchi. Breathing is unlabored. very congested cough  Heart: RRR with S1 S2. No murmurs, rubs, or gallops appreciated. Abdomen: Soft, non-tender, non-distended with normoactive bowel sounds. No hepatomegaly. No rebound/guarding. No obvious abdominal masses. Msk:  Strength and tone normal for age. Extremities/Skin: Warm and dry. No clubbing or cyanosis. No edema. No rashes or suspicious lesions. Neuro: Alert and oriented X 3. Moves all extremities spontaneously. Gait is normal. CNII-XII grossly in tact. Psych:  Responds to questions appropriately with a normal  affect.    ASSESSMENT AND PLAN:  55 y.o. year old female with Influenza with respiratory manifestation - Plan: HYDROcodone-homatropine (HYCODAN) 5-1.5 MG/5ML syrup, Albuterol Sulfate (PROAIR RESPICLICK) [id number] (90 Base) MCG/ACT AEPB, ibuprofen (ADVIL,MOTRIN) 600 MG tablet  Acute bronchitis, unspecified organism - Plan: amoxicillin-clavulanate (AUGMENTIN) 875-125 MG tablet  This chart was scribed in my presence and reviewed by me personally.     Signed, Sharon Haber, MD 06/12/2015 2:48 PM

## 2015-06-13 MED FILL — HYDROCODONE-HOMATROPINE SYR: 5-1.5 | 8 days supply | Qty: 120 | Fill #0

## 2015-06-13 MED FILL — PROAIR RESPICLICK INHAL PWD: 108 (90 BAS | 25 days supply | Qty: 1 | Fill #0

## 2015-06-13 MED FILL — AMOX TR-K CLV 875-125 MG TA: 875-125 | 10 days supply | Qty: 20 | Fill #0

## 2015-06-30 ENCOUNTER — Encounter: Payer: 59 | Attending: Internal Medicine | Admitting: *Deleted

## 2015-06-30 ENCOUNTER — Encounter: Payer: Self-pay | Admitting: *Deleted

## 2015-06-30 DIAGNOSIS — Z713 Dietary counseling and surveillance: Secondary | ICD-10-CM | POA: Insufficient documentation

## 2015-06-30 DIAGNOSIS — E119 Type 2 diabetes mellitus without complications: Secondary | ICD-10-CM | POA: Insufficient documentation

## 2015-06-30 NOTE — Patient Instructions (Signed)
Plan:  Aim for 2 Carb Choices per meal (30 grams) +/- 1 either way  Aim for 0-1 Carbs per snack if hungry  Include protein in moderation with your meals and snacks Consider reading food labels for Total Carbohydrate of foods Continue checking BG at alternate times per day as directed by MD  Continue taking medications as directed by MD Consider increasing activity level to daily as tolerated as you continue to feel better

## 2015-06-30 NOTE — Progress Notes (Signed)
Diabetes Self-Management Education  Visit Type:  Follow-up  Appt. Start Time: 1500 Appt. End Time: [id number]  06/30/2015  [name], identified by name and date of birth, is a 55 y.o. female with a diagnosis of Diabetes: Type 2.   ASSESSMENT  There were no vitals taken for this visit. There is no weight on file to calculate BMI.       Diabetes Self-Management Education - 06/30/15 1506    Health Coping   How would you rate your overall health? Good   Psychosocial Assessment   Patient Belief/Attitude about Diabetes Motivated to manage diabetes   Self-care barriers None   Self-management support Family;CDE visits   Patient Concerns Nutrition/Meal planning;Glycemic Control;Weight Control   Learning Readiness Change in progress   Complications   Last HgB A1C per patient/outside source 6.5 %   How often do you check your blood sugar? 3-4 times / week   Fasting Blood glucose range (mg/dL) 70-129   Postprandial Blood glucose range (mg/dL) 70-129   Number of hypoglycemic episodes per month 0   Exercise   Exercise Type Light (walking / raking leaves)  has walked 1 mile several times this week with co-workers   Patient Education   Nutrition management  Meal options for control of blood glucose level and chronic complications.;Carbohydrate counting   Physical activity and exercise  Role of exercise on diabetes management, blood pressure control and cardiac health.   Monitoring Purpose and frequency of SMBG.   Acute complications --  Patient states FBG are between 70 and 79 mg/dl and post meal are all under 85 mg/dl   Psychosocial adjustment Identified and addressed patients feelings and concerns about diabetes  She states she feels better emotionally about having diabetes especially since she has more knowlege and can tell she is controlling it well.    Individualized Goals (developed by patient)   Nutrition General guidelines for healthy choices and portions discussed   Physical  Activity 30 minutes per day   Medications take my medication as prescribed   Monitoring  test my blood glucose as discussed   Patient Self-Evaluation of Goals - Patient rates self as meeting previously set goals (% of time)   Nutrition >75%   Physical Activity 50 - 75 %   Medications >75%   Monitoring >75%   Problem Solving >75%   Reducing Risk >75%   Health Coping >75%   Outcomes   Program Status Completed   Subsequent Visit   Since your last visit have you continued or begun to take your medications as prescribed? Yes   Since your last visit have you experienced any weight changes? --  Did not choose to weigh today   Since your last visit, are you checking your blood glucose at least once a day? No  but she is checking 2-3 times a week and all BG's are within target ranges      Learning Objective:  Patient will have a greater understanding of diabetes self-management. Patient education plan is to attend individual and/or group sessions per assessed needs and concerns.   Plan:   Patient Instructions  Plan:  Aim for 2 Carb Choices per meal (30 grams) +/- 1 either way  Aim for 0-1 Carbs per snack if hungry  Include protein in moderation with your meals and snacks Consider reading food labels for Total Carbohydrate of foods Continue checking BG at alternate times per day as directed by MD  Continue taking medications as directed by MD Consider  increasing activity level to daily as tolerated as you continue to feel better       Expected Outcomes:  Demonstrated interest in learning. Expect positive outcomes  Education material provided: no new handouts today  If problems or questions, patient to contact team via:  Phone and Email  Future DSME appointment: - PRN

## 2015-07-06 MED FILL — MYRBETRIQ ER 25 MG TABLET: 25 | 30 days supply | Qty: 30 | Fill #1

## 2015-07-06 MED FILL — VIT D2 1.25 MG (50,000 UNIT: 1.25 MG | 28 days supply | Qty: 4 | Fill #1

## 2015-07-06 MED FILL — LIVALO 2 MG TABLET: 2 | 30 days supply | Qty: 30 | Fill #2

## 2015-07-12 ENCOUNTER — Ambulatory Visit (INDEPENDENT_AMBULATORY_CARE_PROVIDER_SITE_OTHER): Payer: 59 | Admitting: Internal Medicine

## 2015-07-12 ENCOUNTER — Encounter: Payer: Self-pay | Admitting: Internal Medicine

## 2015-07-12 VITALS — BP 110/74 | HR 84 | Ht 66.0 in | Wt 202.0 lb

## 2015-07-12 DIAGNOSIS — R1314 Dysphagia, pharyngoesophageal phase: Secondary | ICD-10-CM

## 2015-07-12 DIAGNOSIS — R1310 Dysphagia, unspecified: Secondary | ICD-10-CM

## 2015-07-12 DIAGNOSIS — K219 Gastro-esophageal reflux disease without esophagitis: Secondary | ICD-10-CM

## 2015-07-12 DIAGNOSIS — R1319 Other dysphagia: Secondary | ICD-10-CM

## 2015-07-12 MED ORDER — PANTOPRAZOLE SODIUM 40 MG PO TBEC: 40.0000 mg | DELAYED_RELEASE_TABLET | Freq: Every day | ORAL | Status: DC

## 2015-07-12 MED FILL — PANTOPRAZOLE SOD DR 40 MG T: 40 | 90 days supply | Qty: 90 | Fill #0

## 2015-07-12 NOTE — Progress Notes (Signed)
  Referred by: [name], MD  Subjective:    Patient ID: [name], female    DOB: 1960-05-11, 55 y.o.   MRN: [id number] Cc" swallowing problems HPI Is a very nice lady I had seen a number of years ago, don't have those records available today. She is complaining of intermittent solid food dysphagia and a lot of heartburn and belching. She has been on lansoprazole 15 mg daily and hasn't really helped much. She has gained a bit of weight not losing. She describes a suprasternal sticking point heaviness in that area after she eats solid foods. Sometimes she'll drink in the food will pass I think she's had regurgitated as well. He's never had an upper endoscopy. Colonoscopy 2 at [facility], results unknown to me today.  She was admitted with chest pain problems back in February, had a negative CTA of the coronary arteries. Was told she might have GI problems. Medications, allergies, past medical history, past surgical history, family history and social history are reviewed and updated in the EMR.   Review of Systems As per history of present illness. Also some hoarseness and sore throat, since she had the flu in late March. Insomnia stress urinary incontinence at times allergies has been a bit anxious at times some low back pain. All other review of systems are negative at this time.    Objective:   Physical Exam @BP  110/74 mmHg  Pulse 84  Ht 5\' 6"  (1.676 m)  Wt 202 lb (91.627 kg)  BMI 32.62 kg/m2@  General:  Well-developed, well-nourished and in no acute distress Eyes:  anicteric. ENT:   Mouth and posterior pharynx free of lesions.  Neck:   supple w/o thyromegaly or mass.  Lungs: Clear to auscultation bilaterally. Heart:  S1S2, no rubs, murmurs, gallops. Abdomen:  soft, non-tender, no hepatosplenomegaly, hernia, or mass and BS+.  Lymph:  no cervical or supraclavicular adenopathy. Extremities:   no edema, cyanosis or clubbing Skin   no rash. Neuro:  A&O x 3.  Psych:    appropriate mood and  Affect.   Data Reviewed:  Prior hospital notes 04/2015 CT-A coronaries CT abd 2014      Assessment & Plan:   Encounter Diagnoses  Name Primary?  . Esophageal dysphagia Yes  . Gastroesophageal reflux disease, esophagitis presence not specified    1) Start pantoprazole 40 mg qd, stop 15 mg lansoprazole 2) EGD, dilation liklely 07/18/15 - suspect GERD-related esophageal stricture, dysmotility also possible. Cancer in differential but unlikely w/o weight loss. 3) The risks and benefits as well as alternatives of endoscopic procedure(s) have been discussed and reviewed. All questions answered. The patient agrees to proceed.  I appreciate the opportunity to care for this patient. CC: Andrena Mews, MD

## 2015-07-12 NOTE — Patient Instructions (Addendum)
  You have been scheduled for an endoscopy. Please follow written instructions given to you at your visit today. If you use inhalers (even only as needed), please bring them with you on the day of your procedure.  We have sent the following medications to your pharmacy for you to pick up at your convenience: Pantoprazole, stop your lansoprazole  We will obtain your records from [facility] for Dr Carlean Purl to review.   I appreciate the opportunity to care for you.

## 2015-07-14 ENCOUNTER — Other Ambulatory Visit: Payer: Self-pay | Admitting: *Deleted

## 2015-07-14 MED ORDER — BAYER MICROLET LANCETS MISC: Status: DC

## 2015-07-14 MED FILL — MICROLET LANCETS: 90 days supply | Qty: 100 | Fill #0

## 2015-07-14 MED FILL — CONTOUR TEST STRIPS: 90 days supply | Qty: 200 | Fill #1

## 2015-07-18 ENCOUNTER — Ambulatory Visit (AMBULATORY_SURGERY_CENTER): Payer: 59 | Admitting: Internal Medicine

## 2015-07-18 ENCOUNTER — Encounter: Payer: Self-pay | Admitting: Internal Medicine

## 2015-07-18 VITALS — BP 114/69 | HR 77 | Temp 97.1°F | Resp 14 | Ht 66.0 in | Wt 202.0 lb

## 2015-07-18 DIAGNOSIS — R1314 Dysphagia, pharyngoesophageal phase: Secondary | ICD-10-CM | POA: Diagnosis present

## 2015-07-18 DIAGNOSIS — K222 Esophageal obstruction: Secondary | ICD-10-CM | POA: Diagnosis not present

## 2015-07-18 DIAGNOSIS — E119 Type 2 diabetes mellitus without complications: Secondary | ICD-10-CM | POA: Diagnosis not present

## 2015-07-18 DIAGNOSIS — R1310 Dysphagia, unspecified: Secondary | ICD-10-CM

## 2015-07-18 DIAGNOSIS — K208 Other esophagitis: Secondary | ICD-10-CM | POA: Diagnosis not present

## 2015-07-18 DIAGNOSIS — R1319 Other dysphagia: Secondary | ICD-10-CM

## 2015-07-18 DIAGNOSIS — K219 Gastro-esophageal reflux disease without esophagitis: Secondary | ICD-10-CM | POA: Diagnosis not present

## 2015-07-18 DIAGNOSIS — K2210 Ulcer of esophagus without bleeding: Secondary | ICD-10-CM

## 2015-07-18 LAB — GLUCOSE, CAPILLARY
Glucose-Capillary: 113 mg/dL — ABNORMAL HIGH (ref 65–99)
Glucose-Capillary: 118 mg/dL — ABNORMAL HIGH (ref 65–99)

## 2015-07-18 MED ORDER — SODIUM CHLORIDE 0.9 % IV SOLN: 500.0000 mL | INTRAVENOUS | Status: DC

## 2015-07-18 NOTE — Progress Notes (Signed)
No problems noted in the recovery room. maw 

## 2015-07-18 NOTE — Patient Instructions (Addendum)
I dilated the esophagus - there was a narrow area called a stricture and some inflammation - consistent with acid reflux problems. Also have a small hiatal hernia.  Medicine and lifestyle changes usually treat this well. Please stay on the pantoprazole - and follow reflux diet.  I appreciate the opportunity to care for you. Gatha Mayer, MD, FACG     YOU HAD AN ENDOSCOPIC PROCEDURE TODAY AT [facility]:   Refer to the procedure report that was given to you for any specific questions about what was found during the examination.  If the procedure report does not answer your questions, please call your gastroenterologist to clarify.  If you requested that your care partner not be given the details of your procedure findings, then the procedure report has been included in a sealed envelope for you to review at your convenience later.  YOU SHOULD EXPECT: Some feelings of bloating in the abdomen. Passage of more gas than usual.  Walking can help get rid of the air that was put into your GI tract during the procedure and reduce the bloating. If you had a lower endoscopy (such as a colonoscopy or flexible sigmoidoscopy) you may notice spotting of blood in your stool or on the toilet paper. If you underwent a bowel prep for your procedure, you may not have a normal bowel movement for a few days.  Please Note:  You might notice some irritation and congestion in your nose or some drainage.  This is from the oxygen used during your procedure.  There is no need for concern and it should clear up in a day or so.  SYMPTOMS TO REPORT IMMEDIATELY:    Following upper endoscopy (EGD)  Vomiting of blood or coffee ground material  New chest pain or pain under the shoulder blades  Painful or persistently difficult swallowing  New shortness of breath  Fever of 100F or higher  Black, tarry-looking stools  For urgent or emergent issues, a gastroenterologist can be reached at any  hour by calling [phone].   DIET: Please follow the dilatation diet the rest of today.  Handout was given to your care partner.  Drink plenty of fluids but you should avoid alcoholic beverages for 24 hours.  ACTIVITY:  You should plan to take it easy for the rest of today and you should NOT DRIVE or use heavy machinery until tomorrow (because of the sedation medicines used during the test).    FOLLOW UP: Our staff will call the number listed on your records the next business day following your procedure to check on you and address any questions or concerns that you may have regarding the information given to you following your procedure. If we do not reach you, we will leave a message.  However, if you are feeling well and you are not experiencing any problems, there is no need to return our call.  We will assume that you have returned to your regular daily activities without incident.  If any biopsies were taken you will be contacted by phone or by letter within the next 1-3 weeks.  Please call us at [phone] if you have not heard about the biopsies in 3 weeks.    SIGNATURES/CONFIDENTIALITY: You and/or your care partner have signed paperwork which will be entered into your electronic medical record.  These signatures attest to the fact that that the information above on your After Visit Summary has been reviewed and is understood.  Full  responsibility of the confidentiality of this discharge information lies with you and/or your care-partner.   Handouts were given to your care partner on GERD, hiatal hernia and the dilatation diet to follow the rest of today. Your blood sugar was 118 in the recovery room. You may resume your current medications today. Await biopsy results. Please call if any questions or concerns.

## 2015-07-18 NOTE — Progress Notes (Signed)
Report given to PACU RN, vss 

## 2015-07-18 NOTE — Op Note (Signed)
[place] Patient Name: [name] Procedure Date: 07/18/2015 8:45 AM MRN: [id number] Endoscopist: Gatha Mayer , MD Age: 55 Date of Birth: Jul 23, 1960 Gender: Female Procedure:                Upper GI endoscopy Indications:              Dysphagia Medicines:                Propofol per Anesthesia, Monitored Anesthesia Care Procedure:                Pre-Anesthesia Assessment:                           - Prior to the procedure, a History and Physical                            was performed, and patient medications and                            allergies were reviewed. The patient's tolerance of                            previous anesthesia was also reviewed. The risks                            and benefits of the procedure and the sedation                            options and risks were discussed with the patient.                            All questions were answered, and informed consent                            was obtained. Prior Anticoagulants: The patient has                            taken no previous anticoagulant or antiplatelet                            agents. ASA Grade Assessment: II - A patient with                            mild systemic disease. After reviewing the risks                            and benefits, the patient was deemed in                            satisfactory condition to undergo the procedure.                           After obtaining informed consent, the endoscope was  passed under direct vision. Throughout the                            procedure, the patient's blood pressure, pulse, and                            oxygen saturations were monitored continuously. The                            Model GIF-[id number]) scope was introduced                            through the mouth, and advanced to the second part                            of duodenum. The upper GI endoscopy was              accomplished without difficulty. The patient                            tolerated the procedure well. Scope In: Scope Out: Findings:                 One moderate benign-appearing, intrinsic stenosis                            was found at the gastroesophageal junction. And was                            traversed. A TTS dilator was passed through the                            scope. Dilation with an 18-19-20 mm balloon dilator                            was performed to 20 mm. The dilation site was                            examined and showed moderate improvement in luminal                            narrowing. Estimated blood loss was minimal.                           LA Grade B (one or more mucosal breaks greater than                            5 mm, not extending between the tops of two mucosal                            folds) esophagitis with no bleeding was found at                            the gastroesophageal junction. Biopsies were taken  with a cold forceps for histology. Verification of                            patient identification for the specimen was done.                            Estimated blood loss was minimal.                           A small hiatal hernia was present.                           The exam was otherwise without abnormality.                           The cardia and gastric fundus were normal on                            retroflexion. Complications:            No immediate complications. Estimated Blood Loss:     Estimated blood loss was minimal. Impression:               - Benign-appearing esophageal stenosis. Dilated.                           - LA Grade B reflux esophagitis. Biopsied.                           - Small hiatal hernia.                           - The examination was otherwise normal. Recommendation:           - Patient has a contact number available for                            emergencies.  The signs and symptoms of potential                            delayed complications were discussed with the                            patient. Return to normal activities tomorrow.                            Written discharge instructions were provided to the                            patient.                           - Clear liquids x 1 hour then soft foods rest of                            day. Start prior diet tomorrow.                           -  Continue present medications.                           - Await pathology results.                           - Observe patient's clinical course following                            today's procedure with therapeutic intervention.                           - Follow an antireflux regimen.                           - Use Protonix (pantoprazole) 40 mg PO daily At                            least x 2 -3 months then can consider dose                            reduction to 20 mg but anticipate needing chronic                            therapy Gatha Mayer, MD 07/18/2015 9:19:39 AM This report has been signed electronically. CC Letter to:             [name]

## 2015-07-18 NOTE — Progress Notes (Signed)
Called to room to assist during endoscopic procedure.  Patient ID and intended procedure confirmed with present staff. Received instructions for my participation in the procedure from the performing physician.  

## 2015-07-19 ENCOUNTER — Encounter: Payer: Self-pay | Admitting: Pulmonary Disease

## 2015-07-19 ENCOUNTER — Telehealth: Payer: Self-pay | Admitting: *Deleted

## 2015-07-19 ENCOUNTER — Ambulatory Visit (INDEPENDENT_AMBULATORY_CARE_PROVIDER_SITE_OTHER): Payer: 59 | Admitting: Pulmonary Disease

## 2015-07-19 VITALS — BP 108/68 | HR 64 | Ht 66.0 in | Wt 204.0 lb

## 2015-07-19 DIAGNOSIS — R911 Solitary pulmonary nodule: Secondary | ICD-10-CM

## 2015-07-19 DIAGNOSIS — K219 Gastro-esophageal reflux disease without esophagitis: Secondary | ICD-10-CM | POA: Diagnosis not present

## 2015-07-19 DIAGNOSIS — G4710 Hypersomnia, unspecified: Secondary | ICD-10-CM

## 2015-07-19 DIAGNOSIS — R05 Cough: Secondary | ICD-10-CM | POA: Diagnosis not present

## 2015-07-19 DIAGNOSIS — R059 Cough, unspecified: Secondary | ICD-10-CM | POA: Insufficient documentation

## 2015-07-19 HISTORY — DX: Hypersomnia, unspecified: G47.10

## 2015-07-19 NOTE — Assessment & Plan Note (Signed)
Chest ct scan (04/2015)  4 mm RML nodule. Likely scar.  5PY smoking history, quit 10 yrs ago. Suggest chest ct scan in 04/2016.

## 2015-07-19 NOTE — Assessment & Plan Note (Signed)
On and off sinus congestion. On allergy meds.  May get worse with cpap.

## 2015-07-19 NOTE — Progress Notes (Signed)
Subjective:    Patient ID: [name], female    DOB: [birth year], 55 y.o.   MRN: [id number]  HPI    This is the case of [name], 55 y.o. Female, who was referred by Dr. Andrena Mews  in consultation regarding possible OSA.   As you very well know, patient 5 PY smoking history, quit in her 57s. Denies asthma or copd. Has sinus issues, controlled by meds prn.  Pt was admitted in 04/2015 for acute SOB and cp. dxed with GERD. Pt has been found to have witnessed apneas.  Has snoring. Has unrefreshed sleep. Wakes up unrefreshed.  No energy in am.  Has gasping, choking.  Sleeps 4 hrs/night. Gets sleepy in am. Has frequent awakening.  Sleepiness affects her fxnality.  (-) abnormal behavior in sleep.          Review of Systems  Constitutional: Negative.  Negative for fever and unexpected weight change.  HENT: Positive for congestion, postnasal drip and sinus pressure. Negative for dental problem, ear pain, nosebleeds, rhinorrhea, sneezing, sore throat and trouble swallowing.   Eyes: Negative.  Negative for redness and itching.  Respiratory: Positive for shortness of breath and wheezing. Negative for cough and chest tightness.   Cardiovascular: Negative.  Negative for palpitations and leg swelling.  Gastrointestinal: Negative.  Negative for nausea and vomiting.  Endocrine: Negative.   Genitourinary: Negative.  Negative for dysuria.  Musculoskeletal: Negative.  Negative for joint swelling.  Skin: Negative.  Negative for rash.  Allergic/Immunologic: Positive for environmental allergies.  Neurological: Positive for headaches.  Hematological: Negative.  Does not bruise/bleed easily.  Psychiatric/Behavioral: Negative.  Negative for dysphoric mood. The patient is not nervous/anxious.    Past Medical History  Diagnosis Date  . Sleep deprivation     loss of sleep  . Arthritis   . Migraine headache   . History of measles, mumps, or rubella   . H/O varicella   . Hx  of ovarian cyst   . Depression   . 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 Anxiety   . Diabetes mellitus without complication ([place])   . GERD (gastroesophageal reflux disease)   . Sleep apnea     pt denies  . Hyperlipidemia    (-) CA, DVT  Family History  Problem Relation Age of Onset  . Hypertension Mother   . Hypotension Mother   . Anemia Mother     low iron  . Heart disease Sister   . Sickle cell trait Other   . Alcohol abuse Brother   . Alcohol abuse Brother   . Drug abuse Brother   . Colon cancer Neg Hx      Past Surgical History  Procedure Laterality Date  . Abdominal hysterectomy    . Oophorectomy  2009  . Cholecystectomy  11/13/10  . Dilation and curettage of uterus    . Tubal ligation    . Colonoscopy  Multiple    Social History   Social History  . Marital Status: Married    Spouse Name: N/A  . Number of Children: N/A  . Years of Education: N/A   Occupational History  . Not on file.   Social History Main Topics  . Smoking status: Former Smoker -- 0.50 packs/day    Types: Cigarettes  . Smokeless tobacco: Never Used  . Alcohol Use: 0.0 oz/week    0 Standard drinks or equivalent per week  Comment: rarely  . Drug Use: No  . Sexual Activity:    Partners: Male    Birth Control/ Protection: Surgical     Comment: Hysterectomy   Other Topics Concern  . Not on file   Social History Narrative   Patient lives at home with her husband [name]). 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 Two children. Both boys     Allergies  Allergen Reactions  . Sulfa Antibiotics Anaphylaxis, Hives and Swelling     Outpatient Prescriptions Prior to Visit  Medication Sig Dispense Refill  . Albuterol Sulfate (PROAIR RESPICLICK) 123XX123 (90 Base) MCG/ACT AEPB Inhale 2 puffs into the lungs every 6 (six) hours as needed. 1 each 3  .  aspirin-acetaminophen-caffeine (EXCEDRIN MIGRAINE) 250-250-65 MG tablet Take 1 tablet by mouth daily as needed for headache or migraine. Reported on 05/27/2015    . BAYER MICROLET LANCETS lancets Use to test blood sugar once daily as instructed. 100 each 3  . estradiol (ESTRACE) 1 MG tablet Take 1 tablet (1 mg total) by mouth 2 (two) times daily. 60 tablet 5  . glucose blood (BAYER CONTOUR TEST) test strip Use 2x a day 100 each 11  . meloxicam (MOBIC) 7.5 MG tablet Take 7.5 mg by mouth 2 (two) times daily. Reported on 05/27/2015    . metFORMIN (GLUCOPHAGE-XR) 500 MG 24 hr tablet Take 1 tablet (500 mg total) by mouth 2 (two) times daily with a meal. 60 tablet 2  . mirabegron ER (MYRBETRIQ) 25 MG TB24 tablet Take 1 tablet (25 mg total) by mouth daily. 30 tablet 5  . pantoprazole (PROTONIX) 40 MG tablet Take 1 tablet (40 mg total) by mouth daily before breakfast. 90 tablet 3  . Pitavastatin Calcium (LIVALO) 2 MG TABS Take 2 mg by mouth daily.    . Vitamin D, Ergocalciferol, (DRISDOL) 50000 units CAPS capsule Take 1 capsule (50,000 Units total) by mouth every 7 (seven) days. 4 capsule 1   No facility-administered medications prior to visit.   No orders of the defined types were placed in this encounter.           Objective:   Physical Exam  Vitals:  Filed Vitals:   07/19/15 1110  BP: 108/68  Pulse: 64  Height: 5\' 6"  (1.676 m)  Weight: 204 lb (92.534 kg)  SpO2: 97%    Constitutional/General:  Pleasant, well-nourished, well-developed, not in any distress,  Comfortably seating.  Well kempt  Body mass index is 32.94 kg/(m^2). Wt Readings from Last 3 Encounters:  07/19/15 204 lb (92.534 kg)  07/18/15 202 lb (91.627 kg)  07/12/15 202 lb (91.627 kg)     HEENT: Pupils equal and reactive to light and accommodation. Anicteric sclerae. Normal nasal mucosa.   No oral  lesions,  mouth clear,  oropharynx clear, no postnasal drip. (-) Oral thrush. No dental caries.  Airway - Mallampati  class III  Neck: No masses. Midline trachea. No JVD, (-) LAD. (-) bruits appreciated.  Respiratory/Chest: Grossly normal chest. (-) deformity. (-) Accessory muscle use.  Symmetric expansion. (-) Tenderness on palpation.  Resonant on percussion.  Diminished BS on both lower lung zones. (-) wheezing, crackles, rhonchi (-) egophony  Cardiovascular: Regular rate and  rhythm, heart sounds normal, no murmur or gallops, no peripheral edema  Gastrointestinal:  Normal bowel sounds. Soft, non-tender. No hepatosplenomegaly.  (-) masses.   Musculoskeletal:  Normal muscle tone. Normal gait.   Extremities:  Grossly normal. (-) clubbing, cyanosis.  (-) edema  Skin: (-) rash,lesions seen.   Neurological/Psychiatric : alert, oriented to time, place, person. Normal mood and affect            Assessment & Plan:  Hypersomnia Has snoring. Has unrefreshed sleep. Wakes up unrefreshed.  No energy in am.  Has gasping, choking.  Sleeps 4 hrs/night. Gets sleepy in am. Has frequent awakening.  Sleepiness affects her fxnality.  (-) abnormal behavior in sleep.   Plan for HST. Anticipate no issues with cpap. May have issues with deductible.   Cough On and off sinus congestion. On allergy meds.  May get worse with cpap.   GERD (gastroesophageal reflux disease) Asp precaution. Not on PPI. Diet changes  Lung nodule Chest ct scan (04/2015)  4 mm RML nodule. Likely scar.  5PY smoking history, quit 10 yrs ago. Suggest chest ct scan in 04/2016.      Thank you very much for letting me participate in this patient's care. Please do not hesitate to give me a call if you have any questions or concerns regarding the treatment plan.   Patient will follow up with me in 2-3 mos    J. Shirl Harris, MD 07/19/2015   12:17 PM [facility] [place] Pager: [phone] Office: [phone], Fax: [phone]

## 2015-07-19 NOTE — Patient Instructions (Signed)
1. We will schedule you for a home sleep study. If that is positive, we will order you an auto CPAP machine. 2. Let us know if her having issues with your  CPAP machine.  Return to clinic in [place].

## 2015-07-19 NOTE — Telephone Encounter (Signed)
Message left

## 2015-07-19 NOTE — Assessment & Plan Note (Signed)
Has snoring. Has unrefreshed sleep. Wakes up unrefreshed.  No energy in am.  Has gasping, choking.  Sleeps 4 hrs/night. Gets sleepy in am. Has frequent awakening.  Sleepiness affects her fxnality.  (-) abnormal behavior in sleep.   Plan for HST. Anticipate no issues with cpap. May have issues with deductible.

## 2015-07-19 NOTE — Assessment & Plan Note (Signed)
Asp precaution. Not on PPI. Diet changes

## 2015-08-05 ENCOUNTER — Ambulatory Visit: Payer: Self-pay

## 2015-08-12 ENCOUNTER — Other Ambulatory Visit (INDEPENDENT_AMBULATORY_CARE_PROVIDER_SITE_OTHER): Payer: 59 | Admitting: *Deleted

## 2015-08-12 ENCOUNTER — Ambulatory Visit (INDEPENDENT_AMBULATORY_CARE_PROVIDER_SITE_OTHER): Payer: 59 | Admitting: Internal Medicine

## 2015-08-12 ENCOUNTER — Ambulatory Visit (INDEPENDENT_AMBULATORY_CARE_PROVIDER_SITE_OTHER): Payer: 59 | Admitting: Family Medicine

## 2015-08-12 ENCOUNTER — Encounter: Payer: Self-pay | Admitting: Internal Medicine

## 2015-08-12 ENCOUNTER — Encounter: Payer: Self-pay | Admitting: Family Medicine

## 2015-08-12 VITALS — BP 118/70 | HR 90 | Temp 99.0°F | Resp 12 | Wt 205.0 lb

## 2015-08-12 VITALS — BP 132/83 | HR 78 | Temp 98.6°F | Wt 205.6 lb

## 2015-08-12 DIAGNOSIS — E119 Type 2 diabetes mellitus without complications: Secondary | ICD-10-CM

## 2015-08-12 DIAGNOSIS — J069 Acute upper respiratory infection, unspecified: Secondary | ICD-10-CM | POA: Insufficient documentation

## 2015-08-12 DIAGNOSIS — E785 Hyperlipidemia, unspecified: Secondary | ICD-10-CM

## 2015-08-12 DIAGNOSIS — E1165 Type 2 diabetes mellitus with hyperglycemia: Secondary | ICD-10-CM | POA: Diagnosis not present

## 2015-08-12 LAB — POCT GLYCOSYLATED HEMOGLOBIN (HGB A1C): Hemoglobin A1C: 6.3

## 2015-08-12 MED ORDER — BENZONATATE 100 MG PO CAPS: 100.0000 mg | ORAL_CAPSULE | Freq: Two times a day (BID) | ORAL | Status: DC | PRN

## 2015-08-12 MED ORDER — METFORMIN HCL ER 500 MG PO TB24: 500.0000 mg | ORAL_TABLET | Freq: Two times a day (BID) | ORAL | Status: DC

## 2015-08-12 MED FILL — METFORMIN HCL ER 500 MG TAB: 500 | 90 days supply | Qty: 180 | Fill #0

## 2015-08-12 MED FILL — BENZONATATE 100 MG CAPSULE: 100 | 10 days supply | Qty: 20 | Fill #0

## 2015-08-12 NOTE — Progress Notes (Signed)
Patient ID: [name], female   DOB: [birth year], 55 y.o.   MRN: [id number]  HPI: [name] is a 55 y.o.-year-old female, returning for f/u for DM2; prediabetes dx early 2000s, controlled, without complications and also hyperlipidemia. Last visit 3 mo ago.  She was hospitalized with CP >> GERD-related. She also was found to have a pulm nodule >> will have a repeat CT chest in 1 year.  She has a URI.  Last hemoglobin A1c was: Lab Results  Component Value Date   HGBA1C 6.3 04/26/2015   HGBA1C 6.5 02/03/2015   HGBA1C 6.3 10/08/2014  She had several steroid inj in last year- last in 04/2014.   She is on: - Metformin ER 500 mg 2x a day - She initially had diarrhea/nausea with regular metformin >> resolved.  Prev. Also on: - Tradjenta 5 mg in am, before b'fast - out since yesterday - Invokana 100 mg in am - started 03/2015 >> she had CP, HA, lightheadedness >> now on 50 mg daily  Pt checks sugars 3x a day: - am: 88-109 >> 85-100, 115 x1 >> 70-90 >> 165-180 >> 85-105 >> 74-95 >> 85-95 - 2h after b'fast: n/c >> 78-95 n/c - lunch: 95-101 >> n/c >> 135-155 >> 77-95 >> 66, 85-95 - 2h after lunch: 130-135 >> 110-125 >> 190 >> 135-155 >> 87-95, 110  >> n/c - before dinner (snacking): 170s >> 80-100 >> n/c - 2h after dinner: 165-175 >> 140's >> >200 >> 220-230 >> 90-114 >> 90s-100 - bedtime: 85-98 >> 85-100 No lows. Lowest sugar was 69; ? she has hypoglycemia awareness. Highest: 190 >> 230 >> 114 >> 100  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   - no CKD, last BUN/creatinine:  Lab Results  Component Value Date   BUN 14 05/17/2015   CREATININE 0.94 05/17/2015   - last eye exam was in 04/2015. No DR. - no numbness and tingling in feet.  HL: Reviewed last 3 sets of lipids - now on Livalo.  She is compliant with this.   Lab Results  Component Value Date   CHOL 179 04/26/2015   CHOL 266* 02/03/2015   CHOL 197 02/05/2014   Lab  Results  Component Value Date   HDL 42.60 04/26/2015   HDL 44.40 02/03/2015   HDL 45 02/05/2014   Lab Results  Component Value Date   LDLCALC 123* 04/26/2015   LDLCALC 196* 02/03/2015   LDLCALC 117* 02/05/2014   Lab Results  Component Value Date   TRIG 66.0 04/26/2015   TRIG 128.0 02/03/2015   TRIG 177* 02/05/2014   Lab Results  Component Value Date   CHOLHDL 4 04/26/2015   CHOLHDL 6 02/03/2015   CHOLHDL 4.4 02/05/2014   Her last LFTs are normal: Lab Results  Component Value Date   ALT 9 04/26/2015   AST 12 04/26/2015   ALKPHOS 69 04/26/2015   BILITOT 0.3 04/26/2015   Has FH of HL in sister.  I reviewed pt's medications, allergies, PMH, social hx, family hx, and changes were documented in the history of present illness. Otherwise, unchanged from my initial visit note.  ROS: Constitutional: + weight gain, no fatigue, + hot flushes Eyes: no blurry vision, no xerophthalmia ENT: + sore throat, no nodules palpated in throat, no dysphagia/odynophagia Cardiovascular: no CP/SOB/+ palpitations/no leg swelling Respiratory: + cough/+ SOB /no wheezing Gastrointestinal: + N/no V/D/+ C/+ heartburn Musculoskeletal: + muscle/no joint aches Skin: no rashes Neurological:  no tremors/numbness/tingling/dizziness, + HA  I reviewed pt's medications, allergies, PMH, social hx, family hx, and changes were documented in the history of present illness. Otherwise, unchanged from my initial visit note.  PE: BP 118/70 mmHg  Pulse 90  Temp(Src) 99 F (37.2 C) (Oral)  Resp 12  Wt 205 lb (92.987 kg)  SpO2 97% Body mass index is 33.1 kg/(m^2). Wt Readings from Last 3 Encounters:  08/12/15 205 lb (92.987 kg)  08/12/15 205 lb 9.6 oz (93.26 kg)  07/19/15 204 lb (92.534 kg)   Constitutional: overweight, in NAD Eyes: PERRLA, EOMI, no exophthalmos ENT: moist mucous membranes, no thyromegaly, no cervical lymphadenopathy Cardiovascular: RRR, No MRG Respiratory: CTA B Gastrointestinal:  abdomen soft, NT, ND, BS+ Musculoskeletal: no deformities, strength intact in all 4 Skin: moist, warm, no rashes Neurological: no tremor with outstretched hands, DTR normal in all 4  ASSESSMENT: 1. DM2, non-insulin dependent, without complications, controlled  2. HL  PLAN:  1. Patient with DM2, on metformin ER,previously also on Tradjenta and Invokana, but sugars improved >> we stopped the [place] and Invokana. Sugars are great! She did have one low CBg (54) in [place] after eating a very light b'fast. - I suggested to: Patient Instructions  Please continue:  - Metformin ER 500 mg 2x a day  Please return in 3 months with your sugar log.  - continue checking sugars at different times of the day - check 1x day, rotating checks - UTD with yearly eye exams >> will have one in 2 days - check HbA1c today >> 6.3% (great!) - Return to clinic in 3 mo with sugar log  2. HL - Pt with high LDL >> improved after switching to Livalo (better influence on her sugars than Pravastatin) and changed her diet  - reviewed latest Lipid panel with the pt

## 2015-08-12 NOTE — Patient Instructions (Signed)
Thank you for coming in,   Most likely you are having a viral illness.   You can get an antihistamine/decongestant over the counter such as zyrtec-D or allegra-D.   You can also use afrin which is over the counter. Please only use this for three days.   I have sent in a cough medicine.   If you develop fevers then please return.   Please bring all of your medications with you to each visit.   Health maintenance items that are due.  Health Maintenance  Topic Date Due  .  Hepatitis C: One time screening is recommended by Center for Disease Control  (CDC) for  adults born from [birth year] through 1964.   [birth year]  . Pneumococcal vaccine (1) 02/14/1964  . Complete foot exam   02/14/1972  . Urine Protein Check  02/14/1972  . Tetanus Vaccine  02/13/1981  . Flu Shot  10/18/2015  . Hemoglobin A1C  10/24/2015  . Eye exam for diabetics  05/12/2016  . Pap Smear  10/14/2016  . Mammogram  12/02/2016  . Colon Cancer Screening  12/11/2022  . HIV Screening  Completed     Sign up for My Chart to have easy access to your labs results, and communication with your Primary care physician   Please feel free to call with any questions or concerns at any time, at [phone]. --Dr. Raeford Razor

## 2015-08-12 NOTE — Progress Notes (Signed)
   Subjective:    Patient ID: [name], female    DOB: [birth year], 55 y.o.   MRN: [id number]  Seen for Same day visit for   CC: cough, congestion, sore throat  Started Tuesday afternoon.  Started with a sore throat.  Runny nose is worse at night.  Started some hoarseness.  Having headaches as well.  No fevers.  No sick contacts.  Hasn't tried any medications.  Denies any seasonal allergies.  Reports she has these similar symptoms every other month.  No travel or new foods.  She feels like her ears stopping up.   Ct maxillofacial 2016 showed no significant paranasal sinus disease.   PMH: HLD, GERD, DM2 SH: no alcohol or tobacco use.  FH: brother in law was diagnosed with CSF   Review of Systems   See HPI for ROS. Objective:  BP 132/83 mmHg  Pulse 78  Temp(Src) 98.6 F (37 C) (Oral)  Wt 205 lb 9.6 oz (93.26 kg)  General: NAD HEENT: clear conjunctiva, tympanic membranes clear and intact bilaterally, MMM, uvula midline, no cervical LAD, EOMI, PERRL, erythematous turbinates, no tonsillar exudates  Cardiac: RRR, normal heart sounds, no murmurs.  Respiratory: CTAB, normal effort Extremities:  WWP. Skin: warm and dry, no rashes noted Neuro: alert and oriented, no focal deficits     Assessment & Plan:   URI (upper respiratory infection) Symptoms most likely a viral illness.  Doesn't appear to be allergies  - advised supportive care - can try antihistamine/decongestant and/or afrin - provided tessalon for cough  - f/u prn.

## 2015-08-12 NOTE — Assessment & Plan Note (Signed)
Symptoms most likely a viral illness.  Doesn't appear to be allergies  - advised supportive care - can try antihistamine/decongestant and/or afrin - provided tessalon for cough  - f/u prn.

## 2015-08-12 NOTE — Patient Instructions (Addendum)
Patient Instructions  Please continue:  -Metformin ER 500 mg 2x a day  Please return in 4 months with your sugar log.

## 2015-08-23 ENCOUNTER — Telehealth: Payer: Self-pay | Admitting: Pulmonary Disease

## 2015-08-23 NOTE — Telephone Encounter (Signed)
OK.  Thanks for update.  AD

## 2015-08-23 NOTE — Telephone Encounter (Signed)
I spoke with the patient this morning and she can't afford to do HST at this time. She called her insurance and they stated she would have to pay at least 350.00 as her part of the deductible. She also wanted me to cancel her follow up appt with Dr. Murlean Iba in August which I did

## 2015-09-14 ENCOUNTER — Other Ambulatory Visit: Payer: Self-pay

## 2015-09-14 MED ORDER — PITAVASTATIN CALCIUM 2 MG PO TABS: 2.0000 mg | ORAL_TABLET | Freq: Every day | ORAL | Status: DC

## 2015-09-14 MED FILL — MELOXICAM 7.5 MG TABLET: 7.5 | 30 days supply | Qty: 60 | Fill #1

## 2015-09-14 MED FILL — LIVALO 2 MG TABLET: 2 | 30 days supply | Qty: 30 | Fill #0

## 2015-09-14 MED FILL — MYRBETRIQ ER 25 MG TABLET: 25 | 30 days supply | Qty: 30 | Fill #2

## 2015-09-27 ENCOUNTER — Other Ambulatory Visit: Payer: Self-pay | Admitting: Obstetrics and Gynecology

## 2015-09-27 DIAGNOSIS — Z1231 Encounter for screening mammogram for malignant neoplasm of breast: Secondary | ICD-10-CM

## 2015-09-29 ENCOUNTER — Encounter: Payer: Self-pay | Admitting: Obstetrics and Gynecology

## 2015-09-29 ENCOUNTER — Ambulatory Visit (INDEPENDENT_AMBULATORY_CARE_PROVIDER_SITE_OTHER): Payer: 59 | Admitting: Obstetrics and Gynecology

## 2015-09-29 VITALS — BP 132/80 | HR 70 | Ht 64.0 in | Wt 206.0 lb

## 2015-09-29 DIAGNOSIS — R3989 Other symptoms and signs involving the genitourinary system: Secondary | ICD-10-CM | POA: Diagnosis not present

## 2015-09-29 DIAGNOSIS — R350 Frequency of micturition: Secondary | ICD-10-CM | POA: Diagnosis not present

## 2015-09-29 LAB — POCT URINALYSIS DIPSTICK
Bilirubin, UA: NEGATIVE
Blood, UA: NEGATIVE
Glucose, UA: NEGATIVE
Ketones, UA: NEGATIVE
Leukocytes, UA: NEGATIVE
Nitrite, UA: NEGATIVE
Protein, UA: NEGATIVE
Urobilinogen, UA: NEGATIVE
pH, UA: 6

## 2015-09-29 MED ORDER — MIRABEGRON ER 50 MG PO TB24: 50.0000 mg | ORAL_TABLET | Freq: Every day | ORAL | Status: DC

## 2015-09-29 MED ORDER — PHENAZOPYRIDINE HCL 100 MG PO TABS: 100.0000 mg | ORAL_TABLET | Freq: Three times a day (TID) | ORAL | Status: DC | PRN

## 2015-09-29 NOTE — Progress Notes (Signed)
Patient ID: [name], female   DOB: Nov 11, 1960, 55 y.o.   MRN: [id number] GYNECOLOGY  VISIT   HPI: 55 y.o.   Married  [place] American  female   [phone] with No LMP recorded. Patient has had a hysterectomy.   here for urinary frequency and nocturia. Having urgency and leakage.  Had left lower abdominal pain last hs and this am.   On Myrbetriq. Not working so well now. Detrol did not help.  Drank tea yesterday, but normally does not. Drinks water with lemon.  Last HgbA1C 6.3 this summer with PCP.  Leakage with coughing and sneezing is not as much a problem for the patient.   Urine Dip:  Negative.   GYNECOLOGIC HISTORY: No LMP recorded. Patient has had a hysterectomy. Contraception:  Hysterectomy Menopausal hormone therapy:  Estradiol 1mg  bid Last mammogram:  12-03-14 3D/Density C/Neg/BiRads1:[facility] Last pap smear:   10-14-13 Neg:Neg HR HPV        OB History    Gravida Para Term Preterm AB TAB SAB Ectopic Multiple Living   3 2 2  1     2          Patient Active Problem List   Diagnosis Date Noted  . URI (upper respiratory infection) 08/12/2015  . Hypersomnia 07/19/2015  . Lung nodule 07/19/2015  . Cough 07/19/2015  . Sleep apnea 05/27/2015  . Solitary pulmonary nodule 05/27/2015  . Hot flashes 05/27/2015  . Chest pain with moderate risk for cardiac etiology   . Chest pain 05/15/2015  . GERD (gastroesophageal reflux disease) 05/13/2015  . Type 2 diabetes mellitus with hyperglycemia, without long-term current use of insulin ([place]) 04/12/2015  . Urine, incontinence, stress female 06/18/2014  . Depression with anxiety 01/22/2014  . VAIN I (vaginal intraepithelial neoplasia grade I)   . Hyperlipidemia 10/22/2013  . Fatigue   . MIGRAINE HEADACHE 10/04/2008    Past Medical History  Diagnosis Date  . Sleep deprivation     loss of sleep  . Arthritis   . Migraine headache   . History of measles, mumps, or rubella   . H/O varicella   . Hx of ovarian  cyst   . Depression   . 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 Anxiety   . Diabetes mellitus without complication ([place])   . GERD (gastroesophageal reflux disease)   . Sleep apnea     pt denies  . Hyperlipidemia     Past Surgical History  Procedure Laterality Date  . Abdominal hysterectomy    . Cholecystectomy  11/13/10  . Dilation and curettage of uterus    . Tubal ligation    . Colonoscopy  Multiple  . Oophorectomy  2009    bilateral salpingo-oophorectomy.    Current Outpatient Prescriptions  Medication Sig Dispense Refill  . Albuterol Sulfate (PROAIR RESPICLICK) 123XX123 (90 Base) MCG/ACT AEPB Inhale 2 puffs into the lungs every 6 (six) hours as needed. 1 each 3  . aspirin-acetaminophen-caffeine (EXCEDRIN MIGRAINE) 250-250-65 MG tablet Take 1 tablet by mouth daily as needed for headache or migraine. Reported on 05/27/2015    . BAYER MICROLET LANCETS lancets Use to test blood sugar once daily as instructed. 100 each 3  . estradiol (ESTRACE) 1 MG tablet Take 1 tablet (1 mg total) by mouth 2 (two) times daily. 60 tablet 5  . glucose blood (BAYER CONTOUR TEST) test strip Use 2x a day 100 each 11  .  meloxicam (MOBIC) 7.5 MG tablet Take 7.5 mg by mouth 2 (two) times daily. Reported on 05/27/2015    . metFORMIN (GLUCOPHAGE-XR) 500 MG 24 hr tablet Take 1 tablet (500 mg total) by mouth 2 (two) times daily with a meal. 180 tablet 1  . mirabegron ER (MYRBETRIQ) 50 MG TB24 tablet Take 1 tablet (50 mg total) by mouth daily. 30 tablet 1  . pantoprazole (PROTONIX) 40 MG tablet Take 1 tablet (40 mg total) by mouth daily before breakfast. 90 tablet 3  . Pitavastatin Calcium (LIVALO) 2 MG TABS Take 1 tablet (2 mg total) by mouth daily. 30 tablet 2  . phenazopyridine (PYRIDIUM) 100 MG tablet Take 1 tablet (100 mg total) by mouth 3 (three) times daily as needed for pain. 10 tablet 0   No current facility-administered medications for this  visit.     ALLERGIES: Sulfa antibiotics  Family History  Problem Relation Age of Onset  . Hypertension Mother   . Hypotension Mother   . Anemia Mother     low iron  . Heart disease Sister   . Sickle cell trait Other   . Alcohol abuse Brother   . Alcohol abuse Brother   . Drug abuse Brother   . Colon cancer Neg Hx     Social History   Social History  . Marital Status: Married    Spouse Name: N/A  . Number of Children: N/A  . Years of Education: N/A   Occupational History  . Not on file.   Social History Main Topics  . Smoking status: Former Smoker -- 0.50 packs/day    Types: Cigarettes  . Smokeless tobacco: Never Used  . Alcohol Use: 0.0 oz/week    0 Standard drinks or equivalent per week     Comment: rarely  . Drug Use: No  . Sexual Activity:    Partners: Male    Birth Control/ Protection: Surgical     Comment: Hysterectomy   Other Topics Concern  . Not on file   Social History Narrative   Patient lives at home with her husband [name]). 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 Two children. Both boys    ROS:  Pertinent items are noted in HPI.  PHYSICAL EXAMINATION:    BP 132/80 mmHg  Pulse 70  Ht 5\' 4"  (1.626 m)  Wt 206 lb (93.441 kg)  BMI 35.34 kg/m2    General appearance: alert, cooperative and appears stated age    Pelvic: External genitalia:  no lesions              Urethra:  normal appearing urethra with no masses, tenderness or lesions              Bartholins and Skenes: normal                 Vagina: normal appearing vagina with normal color and discharge, no lesions              Cervix: absent            Bimanual Exam:  Uterus:  uterus absent .  Tender to palpation over the bladder.              Adnexa: no mass, fullness, tenderness              Rectal exam:  Declined.  Chaperone was present for exam.  ASSESSMENT  Status post TAH.  Status post  BSO. Urinary frequency and nocturia.  DM controlled.  Bladder pain.  Negative urine dip.  PLAN  Will have patient treat with Pyridium 100 mg po tid for 2 -3 days.  Increase Myrbetriq to 50 mg daily.  Avoid bladder irritants.  UC sent.  She declines referral to pelvic floor PT and urology at this time.      An After Visit Summary was printed and given to the patient.  __15____ minutes face to face time of which over 50% was spent in counseling.

## 2015-09-30 ENCOUNTER — Encounter: Payer: Self-pay | Admitting: Obstetrics and Gynecology

## 2015-09-30 LAB — URINE CULTURE: Colony Count: 8000

## 2015-10-04 ENCOUNTER — Ambulatory Visit: Payer: Self-pay | Admitting: Internal Medicine

## 2015-10-07 ENCOUNTER — Ambulatory Visit: Payer: Self-pay

## 2015-10-21 ENCOUNTER — Ambulatory Visit: Payer: Self-pay | Admitting: Pulmonary Disease

## 2015-10-28 ENCOUNTER — Encounter: Payer: Self-pay | Admitting: Obstetrics and Gynecology

## 2015-10-28 ENCOUNTER — Other Ambulatory Visit: Payer: Self-pay

## 2015-10-28 ENCOUNTER — Ambulatory Visit (INDEPENDENT_AMBULATORY_CARE_PROVIDER_SITE_OTHER): Payer: 59 | Admitting: Obstetrics and Gynecology

## 2015-10-28 VITALS — BP 118/74 | HR 84 | Resp 16 | Ht 65.0 in | Wt 204.6 lb

## 2015-10-28 VITALS — BP 128/78 | HR 80 | Resp 14 | Ht 65.5 in | Wt 204.8 lb

## 2015-10-28 DIAGNOSIS — N3281 Overactive bladder: Secondary | ICD-10-CM | POA: Diagnosis not present

## 2015-10-28 DIAGNOSIS — E1165 Type 2 diabetes mellitus with hyperglycemia: Secondary | ICD-10-CM

## 2015-10-28 DIAGNOSIS — Z79899 Other long term (current) drug therapy: Secondary | ICD-10-CM

## 2015-10-28 DIAGNOSIS — Z01419 Encounter for gynecological examination (general) (routine) without abnormal findings: Secondary | ICD-10-CM

## 2015-10-28 MED ORDER — ESTRADIOL 1 MG PO TABS: 1.0000 mg | ORAL_TABLET | Freq: Two times a day (BID) | ORAL | 3 refills | Status: DC

## 2015-10-28 NOTE — Patient Instructions (Signed)
Health Maintenance, Female Adopting a healthy lifestyle and getting preventive care can go a long way to promote health and wellness. Talk with your health care provider about what schedule of regular examinations is right for you. This is a good chance for you to check in with your provider about disease prevention and staying healthy. In between checkups, there are plenty of things you can do on your own. Experts have done a lot of research about which lifestyle changes and preventive measures are most likely to keep you healthy. Ask your health care provider for more information. WEIGHT AND DIET  Eat a healthy diet  Be sure to include plenty of vegetables, fruits, low-fat dairy products, and lean protein.  Do not eat a lot of foods high in solid fats, added sugars, or salt.  Get regular exercise. This is one of the most important things you can do for your health.  Most adults should exercise for at least 150 minutes each week. The exercise should increase your heart rate and make you sweat (moderate-intensity exercise).  Most adults should also do strengthening exercises at least twice a week. This is in addition to the moderate-intensity exercise.  Maintain a healthy weight  Body mass index (BMI) is a measurement that can be used to identify possible weight problems. It estimates body fat based on height and weight. Your health care provider can help determine your BMI and help you achieve or maintain a healthy weight.  For females 20 years of age and older:   A BMI below 18.5 is considered underweight.  A BMI of 18.5 to 24.9 is normal.  A BMI of 25 to 29.9 is considered overweight.  A BMI of 30 and above is considered obese.  Watch levels of cholesterol and blood lipids  You should start having your blood tested for lipids and cholesterol at 54 years of age, then have this test every 5 years.  You may need to have your cholesterol levels checked more often if:  Your lipid  or cholesterol levels are high.  You are older than 54 years of age.  You are at high risk for heart disease.  CANCER SCREENING   Lung Cancer  Lung cancer screening is recommended for adults 55-80 years old who are at high risk for lung cancer because of a history of smoking.  A yearly low-dose CT scan of the lungs is recommended for people who:  Currently smoke.  Have quit within the past 15 years.  Have at least a 30-pack-year history of smoking. A pack year is smoking an average of one pack of cigarettes a day for 1 year.  Yearly screening should continue until it has been 15 years since you quit.  Yearly screening should stop if you develop a health problem that would prevent you from having lung cancer treatment.  Breast Cancer  Practice breast self-awareness. This means understanding how your breasts normally appear and feel.  It also means doing regular breast self-exams. Let your health care provider know about any changes, no matter how small.  If you are in your 20s or 30s, you should have a clinical breast exam (CBE) by a health care provider every 1-3 years as part of a regular health exam.  If you are 40 or older, have a CBE every year. Also consider having a breast X-ray (mammogram) every year.  If you have a family history of breast cancer, talk to your health care provider about genetic screening.  If you   are at high risk for breast cancer, talk to your health care provider about having an MRI and a mammogram every year.  Breast cancer gene (BRCA) assessment is recommended for women who have family members with BRCA-related cancers. BRCA-related cancers include:  Breast.  Ovarian.  Tubal.  Peritoneal cancers.  Results of the assessment will determine the need for genetic counseling and BRCA1 and BRCA2 testing. Cervical Cancer Your health care provider may recommend that you be screened regularly for cancer of the pelvic organs (ovaries, uterus, and  vagina). This screening involves a pelvic examination, including checking for microscopic changes to the surface of your cervix (Pap test). You may be encouraged to have this screening done every 3 years, beginning at age 21.  For women ages 30-65, health care providers may recommend pelvic exams and Pap testing every 3 years, or they may recommend the Pap and pelvic exam, combined with testing for human papilloma virus (HPV), every 5 years. Some types of HPV increase your risk of cervical cancer. Testing for HPV may also be done on women of any age with unclear Pap test results.  Other health care providers may not recommend any screening for nonpregnant women who are considered low risk for pelvic cancer and who do not have symptoms. Ask your health care provider if a screening pelvic exam is right for you.  If you have had past treatment for cervical cancer or a condition that could lead to cancer, you need Pap tests and screening for cancer for at least 20 years after your treatment. If Pap tests have been discontinued, your risk factors (such as having a new sexual partner) need to be reassessed to determine if screening should resume. Some women have medical problems that increase the chance of getting cervical cancer. In these cases, your health care provider may recommend more frequent screening and Pap tests. Colorectal Cancer  This type of cancer can be detected and often prevented.  Routine colorectal cancer screening usually begins at 54 years of age and continues through 54 years of age.  Your health care provider may recommend screening at an earlier age if you have risk factors for colon cancer.  Your health care provider may also recommend using home test kits to check for hidden blood in the stool.  A small camera at the end of a tube can be used to examine your colon directly (sigmoidoscopy or colonoscopy). This is done to check for the earliest forms of colorectal  cancer.  Routine screening usually begins at age 50.  Direct examination of the colon should be repeated every 5-10 years through 54 years of age. However, you may need to be screened more often if early forms of precancerous polyps or small growths are found. Skin Cancer  Check your skin from head to toe regularly.  Tell your health care provider about any new moles or changes in moles, especially if there is a change in a mole's shape or color.  Also tell your health care provider if you have a mole that is larger than the size of a pencil eraser.  Always use sunscreen. Apply sunscreen liberally and repeatedly throughout the day.  Protect yourself by wearing long sleeves, pants, a wide-brimmed hat, and sunglasses whenever you are outside. HEART DISEASE, DIABETES, AND HIGH BLOOD PRESSURE   High blood pressure causes heart disease and increases the risk of stroke. High blood pressure is more likely to develop in:  People who have blood pressure in the high end   of the normal range (130-139/85-89 mm Hg).  People who are overweight or obese.  People who are African American.  If you are 38-23 years of age, have your blood pressure checked every 3-5 years. If you are 61 years of age or older, have your blood pressure checked every year. You should have your blood pressure measured twice--once when you are at a hospital or clinic, and once when you are not at a hospital or clinic. Record the average of the two measurements. To check your blood pressure when you are not at a hospital or clinic, you can use:  An automated blood pressure machine at a pharmacy.  A home blood pressure monitor.  If you are between 45 years and 39 years old, ask your health care provider if you should take aspirin to prevent strokes.  Have regular diabetes screenings. This involves taking a blood sample to check your fasting blood sugar level.  If you are at a normal weight and have a low risk for diabetes,  have this test once every three years after 54 years of age.  If you are overweight and have a high risk for diabetes, consider being tested at a younger age or more often. PREVENTING INFECTION  Hepatitis B  If you have a higher risk for hepatitis B, you should be screened for this virus. You are considered at high risk for hepatitis B if:  You were born in a country where hepatitis B is common. Ask your health care provider which countries are considered high risk.  Your parents were born in a high-risk country, and you have not been immunized against hepatitis B (hepatitis B vaccine).  You have HIV or AIDS.  You use needles to inject street drugs.  You live with someone who has hepatitis B.  You have had sex with someone who has hepatitis B.  You get hemodialysis treatment.  You take certain medicines for conditions, including cancer, organ transplantation, and autoimmune conditions. Hepatitis C  Blood testing is recommended for:  Everyone born from [birth year] through 1964.  Anyone with known risk factors for hepatitis C. Sexually transmitted infections (STIs)  You should be screened for sexually transmitted infections (STIs) including gonorrhea and chlamydia if:  You are sexually active and are younger than 54 years of age.  You are older than 53 years of age and your health care provider tells you that you are at risk for this type of infection.  Your sexual activity has changed since you were last screened and you are at an increased risk for chlamydia or gonorrhea. Ask your health care provider if you are at risk.  If you do not have HIV, but are at risk, it may be recommended that you take a prescription medicine daily to prevent HIV infection. This is called pre-exposure prophylaxis (PrEP). You are considered at risk if:  You are sexually active and do not regularly use condoms or know the HIV status of your partner(s).  You take drugs by injection.  You are sexually  active with a partner who has HIV. Talk with your health care provider about whether you are at high risk of being infected with HIV. If you choose to begin PrEP, you should first be tested for HIV. You should then be tested every 3 months for as long as you are taking PrEP.  PREGNANCY   If you are premenopausal and you may become pregnant, ask your health care provider about preconception counseling.  If you may  become pregnant, take 400 to 800 micrograms (mcg) of folic acid every day.  If you want to prevent pregnancy, talk to your health care provider about birth control (contraception). OSTEOPOROSIS AND MENOPAUSE   Osteoporosis is a disease in which the bones lose minerals and strength with aging. This can result in serious bone fractures. Your risk for osteoporosis can be identified using a bone density scan.  If you are 30 years of age or older, or if you are at risk for osteoporosis and fractures, ask your health care provider if you should be screened.  Ask your health care provider whether you should take a calcium or vitamin D supplement to lower your risk for osteoporosis.  Menopause may have certain physical symptoms and risks.  Hormone replacement therapy may reduce some of these symptoms and risks. Talk to your health care provider about whether hormone replacement therapy is right for you.  HOME CARE INSTRUCTIONS   Schedule regular health, dental, and eye exams.  Stay current with your immunizations.   Do not use any tobacco products including cigarettes, chewing tobacco, or electronic cigarettes.  If you are pregnant, do not drink alcohol.  If you are breastfeeding, limit how much and how often you drink alcohol.  Limit alcohol intake to no more than 1 drink per day for nonpregnant women. One drink equals 12 ounces of beer, 5 ounces of wine, or 1 ounces of hard liquor.  Do not use street drugs.  Do not share needles.  Ask your health care provider for help if  you need support or information about quitting drugs.  Tell your health care provider if you often feel depressed.  Tell your health care provider if you have ever been abused or do not feel safe at home.   This information is not intended to replace advice given to you by your health care provider. Make sure you discuss any questions you have with your health care provider.   Document Released: 09/18/2010 Document Revised: 03/26/2014 Document Reviewed: 02/04/2013 Elsevier Interactive Patient Education 2016 [place] for overactive bladder.  This is a patch to put on your skin.

## 2015-10-28 NOTE — Progress Notes (Signed)
55 y.o. [id number] Married [place] American female here for annual exam.    On Estrace 2 mg for hot flashes.   On increased Myrbetriq 50 mg daily.  Not working well.  Detrol LA also did not work well.  Having a headache for 3 days. Want something for a headache now. Not a migraine.   Has some pain with urination that comes and goes.  Has painful urge. UC negative 2 weeks ago.   Urine dip today - negative.   PCP:   Charlett Blake, MD   No LMP recorded. Patient has had a hysterectomy.           Sexually active: Yes.    The current method of family planning is status post hysterectomy.    Exercising: No.  none.  Walking at lunch.  Smoker:  no  Health Maintenance: Pap:  10/14/13 Neg: Neg HR HPV History of abnormal Pap:  Yes 09/2011 LGSIL MMG:  11/2014 Bi-RADS1 negative Colonoscopy:  2015 Normal - repeat 10years BMD:   never TDaP:  2014 Hep C: will do with PCP.  Screening Labs:   Urine today: urine done 09/29/15   reports that she has quit smoking. Her smoking use included Cigarettes. She smoked 0.50 packs per day. She has never used smokeless tobacco. She reports that she drinks alcohol. She reports that she does not use drugs.  Past Medical History:  Diagnosis Date  . Anxiety   . Arthritis   . Depression   . Diabetes mellitus without complication ([place])   . GERD (gastroesophageal reflux disease)   . H/O varicella   . History of measles, mumps, or rubella   . Hx of ovarian cyst   . Hyperlipidemia   . Migraine headache   . Sleep apnea    pt denies  . Sleep deprivation    loss of sleep  . VAIN I (vaginal intraepithelial neoplasia grade I) 2013   pap and confirmed by colposcopic biopsy    Past Surgical History:  Procedure Laterality Date  . ABDOMINAL HYSTERECTOMY    . CHOLECYSTECTOMY  11/13/10  . COLONOSCOPY  Multiple  . DILATION AND CURETTAGE OF UTERUS    . OOPHORECTOMY  2009   bilateral salpingo-oophorectomy.  . TUBAL LIGATION      Current Outpatient  Prescriptions  Medication Sig Dispense Refill  . Albuterol Sulfate (PROAIR RESPICLICK) 123XX123 (90 Base) MCG/ACT AEPB Inhale 2 puffs into the lungs every 6 (six) hours as needed. 1 each 3  . aspirin-acetaminophen-caffeine (EXCEDRIN MIGRAINE) 250-250-65 MG tablet Take 1 tablet by mouth daily as needed for headache or migraine. Reported on 05/27/2015    . BAYER MICROLET LANCETS lancets Use to test blood sugar once daily as instructed. 100 each 3  . estradiol (ESTRACE) 1 MG tablet Take 1 tablet (1 mg total) by mouth 2 (two) times daily. 60 tablet 5  . glucose blood (BAYER CONTOUR TEST) test strip Use 2x a day 100 each 11  . meloxicam (MOBIC) 7.5 MG tablet Take 7.5 mg by mouth 2 (two) times daily. Reported on 05/27/2015    . metFORMIN (GLUCOPHAGE-XR) 500 MG 24 hr tablet Take 1 tablet (500 mg total) by mouth 2 (two) times daily with a meal. 180 tablet 1  . mirabegron ER (MYRBETRIQ) 50 MG TB24 tablet Take 1 tablet (50 mg total) by mouth daily. (Patient taking differently: Take 100 mg by mouth daily. ) 30 tablet 1  . pantoprazole (PROTONIX) 40 MG tablet Take 1 tablet (40 mg total) by mouth daily  before breakfast. 90 tablet 3  . phenazopyridine (PYRIDIUM) 100 MG tablet Take 1 tablet (100 mg total) by mouth 3 (three) times daily as needed for pain. 10 tablet 0  . Pitavastatin Calcium (LIVALO) 2 MG TABS Take 1 tablet (2 mg total) by mouth daily. 30 tablet 2   No current facility-administered medications for this visit.     Family History  Problem Relation Age of Onset  . Hypertension Mother   . Hypotension Mother   . Anemia Mother     low iron  . Heart disease Sister   . Sickle cell trait Other   . Alcohol abuse Brother   . Alcohol abuse Brother   . Drug abuse Brother   . Colon cancer Neg Hx     ROS:  Pertinent items are noted in HPI.  Otherwise, a comprehensive ROS was negative.  Exam:   BP 118/74 (BP Location: Right Arm, Patient Position: Sitting, Cuff Size: Normal)   Pulse 84   Resp 16   Ht  5\' 5"  (1.651 m)   Wt 204 lb 9.6 oz (92.8 kg)   BMI 34.05 kg/m     General appearance: alert, cooperative and appears stated age Head: Normocephalic, without obvious abnormality, atraumatic Neck: no adenopathy, supple, symmetrical, trachea midline and thyroid normal to inspection and palpation Lungs: clear to auscultation bilaterally Breasts: normal appearance, no masses or tenderness, No nipple retraction or dimpling, No nipple discharge or bleeding, No axillary or supraclavicular adenopathy Heart: regular rate and rhythm Abdomen: soft, non-tender; no masses, no organomegaly Extremities: extremities normal, atraumatic, no cyanosis or edema Skin: Skin color, texture, turgor normal. No rashes or lesions Lymph nodes: Cervical, supraclavicular, and axillary nodes normal. No abnormal inguinal nodes palpated Neurologic: Grossly normal  Pelvic: External genitalia:  no lesions              Urethra:  normal appearing urethra with no masses, tenderness or lesions              Bartholins and Skenes: normal                 Vagina: normal appearing vagina with normal color and discharge, no lesions              Cervix:  absent               Pap taken: No. Bimanual Exam:  Uterus:  absent.              Adnexa: no mass, fullness, tenderness              Rectal exam: declined.  Chaperone was present for exam.  Assessment:   Well woman visit with normal exam. ERT patient.  Overactive bladder.  Hx prior LGSIL.   Plan: Yearly mammogram recommended after age 54.  Recommended self breast exam.  Pap and HR HPV as above. Discussed Calcium, Vitamin D, regular exercise program including cardiovascular and weight bearing exercise. Motrin 800 mg to patient now.  Stop Myrbetriq. Can try over the counter Oxytrol.  Call if would like to try another anticholinergic/antimuscarinic. We discussed dietary irritants. Refill Estrace 2 mg daily.  Discussed risks of DVT, PE, stroke, and possible MI/breast  cancer.  She wishes to continue.  Rx for one year.  I did discuss that at some point, we would wean down her dosage and stop her estrogen.  Follow up annually and prn.       After visit summary provided.

## 2015-10-28 NOTE — Patient Outreach (Signed)
[facility]) Care Management   10/28/2015  [name] [birth year] [id number]  [name] is an 55 y.o. female.   Member seen for follow up office visit for Link to Wellness program for self management of Type 2 diabetes  Subjective: Mmeber states that her blood sugars have been good with ranges of 85-95 fasting.  States she tries to walk at lunch for 30-45 minutes a least 3 times a week.  States she is planning on joining the [employer].    Objective:   Review of Systems  Neurological: Positive for headaches.    Physical Exam Today's Vitals   10/28/15 1345 10/28/15 1353  BP: 128/78   Pulse: 80   Resp: 14   SpO2: 98%   Weight: 204 lb 12.8 oz (92.9 kg)   Height: 1.664 m (5' 5.5")   PainSc: 4  4    Encounter Medications:   Outpatient Encounter Prescriptions as of 10/28/2015  Medication Sig  . Albuterol Sulfate (PROAIR RESPICLICK) 123XX123 (90 Base) MCG/ACT AEPB Inhale 2 puffs into the lungs every 6 (six) hours as needed.  Marland Kitchen aspirin-acetaminophen-caffeine (EXCEDRIN MIGRAINE) 250-250-65 MG tablet Take 1 tablet by mouth daily as needed for headache or migraine. Reported on 05/27/2015  . BAYER MICROLET LANCETS lancets Use to test blood sugar once daily as instructed.  Marland Kitchen estradiol (ESTRACE) 1 MG tablet Take 1 tablet (1 mg total) by mouth 2 (two) times daily.  Marland Kitchen glucose blood (BAYER CONTOUR TEST) test strip Use 2x a day  . meloxicam (MOBIC) 7.5 MG tablet Take 7.5 mg by mouth 2 (two) times daily. Reported on 05/27/2015  . metFORMIN (GLUCOPHAGE-XR) 500 MG 24 hr tablet Take 1 tablet (500 mg total) by mouth 2 (two) times daily with a meal.  . mirabegron ER (MYRBETRIQ) 50 MG TB24 tablet Take 1 tablet (50 mg total) by mouth daily. (Patient taking differently: Take 100 mg by mouth daily. )  . pantoprazole (PROTONIX) 40 MG tablet Take 1 tablet (40 mg total) by mouth daily before breakfast.  . phenazopyridine (PYRIDIUM) 100 MG tablet Take 1 tablet (100 mg total) by mouth 3 (three)  times daily as needed for pain.  . Pitavastatin Calcium (LIVALO) 2 MG TABS Take 1 tablet (2 mg total) by mouth daily.   No facility-administered encounter medications on file as of 10/28/2015.     Functional Status:   In your present state of health, do you have any difficulty performing the following activities: 10/28/2015 05/16/2015  Hearing? N N  Vision? N N  Difficulty concentrating or making decisions? N N  Walking or climbing stairs? N N  Dressing or bathing? N N  Doing errands, shopping? N N  Some recent data might be hidden    Fall/Depression Screening:    PHQ 2/9 Scores 10/28/2015 08/12/2015 06/30/2015 06/12/2015 05/27/2015 05/13/2015 05/13/2015  PHQ - 2 Score 0 0 0 0 0 0 0  PHQ- 9 Score - - - - - - -    Assessment:  Member seen for follow up office visit for Link to Wellness program for self management of Type 2 DM. Member with hemoglobin A1C of 6.3% at last endocrinologist visit. Member has been trying to watch her diet better but is not losing weight.  Reports she is walking 3 times  a week for 30- 45 minutes.   Reports CBGs ranging 85-95 fasting.  Member to see endocrinologist on 12/13/15.    Plan:   Plan to eat 30-45 GM (2-3 servings) of carbohydrate  a meal and 15 GM for snacks.  Plan to eat snacks with protein. Plan to check blood sugars 1-2 times a day fasting or 1  -2 hours after eating with goals of less than 100 fasting or 140 or less after meals Plan to walk 3 days a week for 30 minutes and plan to join the [facility] and go 3 times a week.  Goal is to 150 minutes a week Plan to complete EMMI programs by 01/18/16 Plan to see Dr. Cruzita Lederer on 12/13/15 Plan to return to Link to Wellness 01/27/16 at 1:30PM  [facility] CM Care Plan Problem One   Flowsheet Row Most Recent Value  Care Plan Problem One  Potential for elevated blood sugars related to dx of prediabetes  Role Documenting the Problem One  Care Management [place] for Problem One  Active  [facility] Long Term Goal  (31-90 days)  Member will maintain hemoglobin A1C at or below 6.4 until next Link to Wellness visit  [place] Term Goal Start Date  10/28/15 [name] hemoglobin A1C 6.3]  Interventions for Problem One Long Term Goal  Reviewed CHO counting and portion control, Reinforced on importance of regular exercise for glycemic control, Reviewed foot care, Reinforced to eat protein with her snacks     [name] RN, [facility] Care Management Coordinator-Link to [place] Management [phone]

## 2015-10-28 NOTE — Patient Instructions (Signed)
1. Plan to eat 30-45 GM (2-3 servings) of carbohydrate a meal and 15 GM for snacks.  Plan to eat snacks with protein. 2. Plan to check blood sugars 1-2 times a day fasting or 1  -2 hours after eating with goals of less than 100 fasting or 140 or less after meals 3. Plan to walk 3 days a week for 30 minutes and plan to join the [facility] and go 3 times a week.  Goal is to 150 minutes a week 4. Plan to complete EMMI programs by 01/18/16 5. Plan to see Dr. Cruzita Lederer on 12/13/15 6. Plan to return to Link to Wellness 01/27/16 at 1:30PM

## 2015-11-15 ENCOUNTER — Encounter: Payer: Self-pay | Admitting: Nurse Practitioner

## 2015-11-15 ENCOUNTER — Ambulatory Visit (INDEPENDENT_AMBULATORY_CARE_PROVIDER_SITE_OTHER): Payer: 59 | Admitting: Nurse Practitioner

## 2015-11-15 VITALS — BP 116/74 | HR 96 | Temp 98.5°F | Ht 65.0 in | Wt 207.0 lb

## 2015-11-15 DIAGNOSIS — N760 Acute vaginitis: Secondary | ICD-10-CM | POA: Diagnosis not present

## 2015-11-15 DIAGNOSIS — N9089 Other specified noninflammatory disorders of vulva and perineum: Secondary | ICD-10-CM | POA: Diagnosis not present

## 2015-11-15 MED ORDER — VALACYCLOVIR HCL 1 G PO TABS: 1000.0000 mg | ORAL_TABLET | Freq: Two times a day (BID) | ORAL | 0 refills | Status: DC

## 2015-11-15 MED ORDER — NYSTATIN-TRIAMCINOLONE 100000-0.1 UNIT/GM-% EX OINT: 1.0000 "application " | TOPICAL_OINTMENT | Freq: Two times a day (BID) | CUTANEOUS | 0 refills | Status: DC

## 2015-11-15 MED FILL — valACYclovir HCL 1 GM TABS: 1 | 10 days supply | Qty: 20 | Fill #0

## 2015-11-15 MED FILL — NYSTATIN-TRIAMCINOLONE OINT: 100000-0.1 | 10 days supply | Qty: 30 | Fill #0

## 2015-11-15 NOTE — Patient Instructions (Signed)

## 2015-11-15 NOTE — Progress Notes (Signed)
55 y.o. Married [place] American female [phone] here with complaint of vaginal symptoms of itching, burning, without increase discharge. Describes discharge as minimal to none. Onset of symptoms 5 days ago.  Denies new personal products or vaginal dryness. No STD concerns. Urinary symptoms: ongoing problems with stress and urge incontinence.  On Mybetric without help and off since AEX on 10/28/15.  Contraception is hysterectomy.  History  of DM; last HGB AIC 6.3.  No exposure to HSV and no recent URI symptoms or SA.   O:  Healthy female WDWN Affect: normal, orientation x 3  Exam: no distress Abdomen: soft and non tender Lymph node: no enlargement or tenderness Pelvic exam: External genital: normal female with a lesion right labia that is suspicious for HSV, a culture is taken.  There is an external redness of the introitus and very little white vaginal discharge.  BUS: negative Vagina: minimal discharge noted.  Affirm taken.   A: Vaginitis  R/O HSV   P: Discussed findings of vaginitis and etiology. Discussed Aveeno or baking soda sitz bath for comfort. Avoid moist clothes or pads for extended period of time. If working out in gym clothes or swim suits for long periods of time change underwear or bottoms of swimsuit if possible. Olive Oil/Coconut Oil use for skin protection prior to activity can be used to external skin.  Rx: will have her start on Valtrex 1 GM BID for 10 days pending her culture.  Follow with [facility]  [facility] prn

## 2015-11-16 LAB — WET PREP BY MOLECULAR PROBE
Candida species: NEGATIVE
Gardnerella vaginalis: NEGATIVE
Trichomonas vaginosis: NEGATIVE

## 2015-11-16 NOTE — Progress Notes (Signed)
Reviewed personally.  M. Suzanne Ahri Olson, MD.  

## 2015-11-17 LAB — HERPES SIMPLEX VIRUS CULTURE: Organism ID, Bacteria: DETECTED

## 2015-11-18 ENCOUNTER — Telehealth: Payer: Self-pay | Admitting: Nurse Practitioner

## 2015-11-18 NOTE — Telephone Encounter (Signed)
Patient returned call. She confirmed her number is: [phone] (M).

## 2015-11-18 NOTE — Telephone Encounter (Signed)
Called to give pt her test results - per DPR says we can leave a detailed message.  But when the operator paused to let the person name be stated - there was no name.  So not sure if this is her VM.  Left a generic msg to call back.

## 2015-11-22 NOTE — Telephone Encounter (Signed)
Have tried again to call pt and left a message for her to call me back.

## 2015-11-22 NOTE — Telephone Encounter (Signed)
Patient has called back and she is given the results of Herpes culture.  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  She has no known history of exposure and does not think her husband has history of same.  I have discussed her coming in for HSV I/II serum testing.  She says she 'will have to process everything" before making a decision.  She is advised to call for a lab apt if she decides to proceed in this direction.  She will complete the Valtrex on Thursday and feels that the symptoms are almost gone.

## 2015-12-05 MED FILL — HYDROCODON-APAP 7.5-325: 7.5-325 | 30 days supply | Qty: 30 | Fill #0

## 2015-12-05 MED FILL — MELOXICAM 7.5 MG TABLET: 7.5 | 30 days supply | Qty: 60 | Fill #0

## 2015-12-09 ENCOUNTER — Ambulatory Visit
Admission: RE | Admit: 2015-12-09 | Discharge: 2015-12-09 | Disposition: A | Discharge status: Home / Self Care | Payer: 59 | Source: Ambulatory Visit | Attending: Obstetrics and Gynecology | Admitting: Obstetrics and Gynecology

## 2015-12-09 DIAGNOSIS — Z1231 Encounter for screening mammogram for malignant neoplasm of breast: Secondary | ICD-10-CM

## 2015-12-12 MED FILL — ESTRADIOL 1 MG TABLET: 1 | 90 days supply | Qty: 180 | Fill #0

## 2015-12-13 ENCOUNTER — Encounter: Payer: Self-pay | Admitting: Internal Medicine

## 2015-12-13 ENCOUNTER — Ambulatory Visit (INDEPENDENT_AMBULATORY_CARE_PROVIDER_SITE_OTHER): Payer: 59 | Admitting: Internal Medicine

## 2015-12-13 VITALS — BP 130/84 | HR 78 | Ht 65.5 in | Wt 209.0 lb

## 2015-12-13 DIAGNOSIS — E1165 Type 2 diabetes mellitus with hyperglycemia: Secondary | ICD-10-CM

## 2015-12-13 DIAGNOSIS — E785 Hyperlipidemia, unspecified: Secondary | ICD-10-CM | POA: Diagnosis not present

## 2015-12-13 LAB — POCT GLYCOSYLATED HEMOGLOBIN (HGB A1C): Hemoglobin A1C: 6.4

## 2015-12-13 NOTE — Progress Notes (Signed)
Patient ID: [name], female   DOB: 1960/11/01, 55 y.o.   MRN: [id number]  HPI: [name] is a 55 y.o.-year-old femalele, returning for f/u for DM2; prediabetes dx early 2000s, controlled, without complications and also hyperlipidemia. Last visit 4 mo ago.  Last hemoglobin A1c was: Lab Results  Component Value Date   HGBA1C 6.3 08/12/2015   HGBA1C 6.3 04/26/2015   HGBA1C 6.5 02/03/2015  She had several steroid inj in last year- last in 04/2014.   She is on: - Metformin ER 500 mg 2x a day - She initially had diarrhea/nausea with regular metformin >> resolved. Prev. Also on: - Tradjenta 5 mg in am, before b'fast - out since yesterday - Invokana 100 mg in am - started 03/2015 >> she had CP, HA, lightheadedness >> now on 50 mg daily  Pt checks sugars 0-1x a day: - am: 88-109 >> 85-100, 115 x1 >> 70-90 >> 165-180 >> 85-105 >> 74-95 >> 85-95 >> 95-105 - 2h after b'fast: n/c >> 78-95 n/c >> 120-145 - lunch: 95-101 >> n/c >> 135-155 >> 77-95 >> 66, 85-95 >> 95-100 - 2h after lunch: 130-135 >> 110-125 >> 190 >> 135-155 >> 87-95, 110  >> n/c >> 120-145 - before dinner (snacking): 170s >> 80-100 >> n/c - 2h after dinner: 165-175 >> 140's >> >200 >> 220-230 >> 90-114 >> 90s-100 >> 120-145 - bedtime: 85-98 >> 85-100 No lows. Lowest sugar was 69 >> 85; ? she has hypoglycemia awareness. Highest: 190 >> 230 >> 114 >> 100 >> 145.  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   - no CKD, last BUN/creatinine:  Lab Results  Component Value Date   BUN 14 05/17/2015   CREATININE 0.94 05/17/2015   - last eye exam was in 04/2015. No DR. - no numbness and tingling in feet.  HL: Reviewed last 3 sets of lipids - now on Livalo.  She is compliant with this.   Lab Results  Component Value Date   CHOL 179 04/26/2015   CHOL 266 (H) 02/03/2015   CHOL 197 02/05/2014   Lab Results  Component Value Date   HDL 42.60 04/26/2015   HDL 44.40 02/03/2015   HDL 45 02/05/2014   Lab Results  Component Value Date   LDLCALC 123 (H) 04/26/2015   LDLCALC 196 (H) 02/03/2015   LDLCALC 117 (H) 02/05/2014   Lab Results  Component Value Date   TRIG 66.0 04/26/2015   TRIG 128.0 02/03/2015   TRIG 177 (H) 02/05/2014   Lab Results  Component Value Date   CHOLHDL 4 04/26/2015   CHOLHDL 6 02/03/2015   CHOLHDL 4.4 02/05/2014   Her last LFTs are normal: Lab Results  Component Value Date   ALT 9 04/26/2015   AST 12 04/26/2015   ALKPHOS 69 04/26/2015   BILITOT 0.3 04/26/2015   Has FH of HL in sister.  I reviewed pt's medications, allergies, PMH, social hx, family hx, and changes were documented in the history of present illness. Otherwise, unchanged from my initial visit note.  ROS: Constitutional: + weight gain, no fatigue, + hot flushes Eyes: no blurry vision, no xerophthalmia ENT: no sore throat, no nodules palpated in throat, no dysphagia/odynophagia Cardiovascular: no CP/SOB/palpitations/no leg swelling Respiratory: + cough/no SOB/wheezing Gastrointestinal: no N/V/D/C/heartburn Musculoskeletal: no muscle/no joint aches, + back pain Skin: no rashes Neurological: no tremors/numbness/tingling/dizziness, no HA  I reviewed pt's medications, allergies, PMH, social hx, family hx, and changes were  documented in the history of present illness. Otherwise, unchanged from my initial visit note.  PE: BP 130/84   Pulse 78   Ht 5' 5.5" (1.664 m)   Wt 209 lb (94.8 kg)   BMI 34.25 kg/m  Body mass index is 34.25 kg/m. Wt Readings from Last 3 Encounters:  12/13/15 209 lb (94.8 kg)  11/15/15 207 lb (93.9 kg)  10/28/15 204 lb 9.6 oz (92.8 kg)   Constitutional: overweight, in NAD Eyes: PERRLA, EOMI, no exophthalmos ENT: moist mucous membranes, no thyromegaly, no cervical lymphadenopathy Cardiovascular: RRR, No MRG Respiratory: CTA B Gastrointestinal: abdomen soft, NT, ND, BS+ Musculoskeletal: no deformities, strength intact in all 4 Skin:  moist, warm, no rashes Neurological: no tremor with outstretched hands, DTR normal in all 4  ASSESSMENT: 1. DM2, non-insulin dependent, without complications, controlled  2. HL  PLAN:  1. Patient with DM2, on metformin ER,previously also on Tradjenta and Invokana, but sugars improved >> we stopped the [place] and Invokana. Sugars continue to stay great! No more lows, either. - I suggested to: Patient Instructions  Please continue:  - Metformin ER 500 mg 2x a day - Livalo 2 mg daily  Please return in 4 months with your sugar log  - continue checking sugars at different times of the day - check 1x day, rotating checks - UTD with yearly eye exams >> UTD - check HbA1c today >> 6.4% (great!) - Return to clinic in 4 mo with sugar log  2. HL - Pt with high LDL >> improved after switching to Livalo (better influence on her sugars than Pravastatin) and changed her diet  - reviewed latest Lipid panel with the pt >> improved - will continue Livalo  Philemon Kingdom, MD PhD [facility] Endocrinology

## 2015-12-13 NOTE — Addendum Note (Signed)
Addended by: Nile Riggs on: 12/13/2015 01:32 PM   Modules accepted: Orders

## 2015-12-13 NOTE — Patient Instructions (Addendum)
Please continue:  - Metformin ER 500 mg 2x a day - Livalo 2 mg daily  Please return in 4 months with your sugar log.

## 2016-01-11 ENCOUNTER — Ambulatory Visit (INDEPENDENT_AMBULATORY_CARE_PROVIDER_SITE_OTHER): Payer: 59 | Admitting: Family Medicine

## 2016-01-11 DIAGNOSIS — J0140 Acute pansinusitis, unspecified: Secondary | ICD-10-CM

## 2016-01-11 DIAGNOSIS — J329 Chronic sinusitis, unspecified: Secondary | ICD-10-CM | POA: Insufficient documentation

## 2016-01-11 MED ORDER — OXYMETAZOLINE HCL 0.05 % NA SOLN: 1.0000 | Freq: Two times a day (BID) | NASAL | 0 refills | Status: DC

## 2016-01-11 NOTE — Progress Notes (Signed)
    Subjective: [id number] sinus infection HPI: Patient is a 55 y.o. female with a past medical history of T2DM presenting to clinic today for a SDA concerning sinus infection.  Her symptoms started Monday.  Rhinorrhea, nasal congestion, headaches around forehead and maxillary area that is not pressure in nature.  No vision changes. Cough productive of brown sputum for the last 2 days, this is intermittent She started having chills yesterday. She has a low grade fever up to 99.5 this AM.  No otalgias, watery eyes, chest pain, SOB Tylenol, Nyquil, Sudafed without relief   She's nervous as she states she's had multiple sinus infections in the past that were very difficult to get rid of.  Social History: former smoker  Health Maintenance: states she received her flu vaccine at [place] for work, asked her to bring in documentationat   ROS: All other systems reviewed and are negative besides that noted in HPI.  Past Medical History Patient Active Problem List   Diagnosis Date Noted  . Sinusitis 01/11/2016  . URI (upper respiratory infection) 08/12/2015  . Hypersomnia 07/19/2015  . Lung nodule 07/19/2015  . Cough 07/19/2015  . Sleep apnea 05/27/2015  . Solitary pulmonary nodule 05/27/2015  . Hot flashes 05/27/2015  . Chest pain with moderate risk for cardiac etiology   . Chest pain 05/15/2015  . GERD (gastroesophageal reflux disease) 05/13/2015  . Type 2 diabetes mellitus with hyperglycemia, without long-term current use of insulin ([place]) 04/12/2015  . Urine, incontinence, stress female 06/18/2014  . Depression with anxiety 01/22/2014  . VAIN I (vaginal intraepithelial neoplasia grade I)   . Hyperlipidemia 10/22/2013  . Fatigue   . MIGRAINE HEADACHE 10/04/2008    Medications- reviewed and updated  Objective: Office vital signs reviewed. BP (!) 148/75 (BP Location: Right Arm, Patient Position: Sitting, Cuff Size: Normal)   Pulse 82   Temp 98 F (36.7 C) (Oral)   Wt  212 lb (96.2 kg)   BMI 34.74 kg/m    Physical Examination:  General: Awake, alert, well- nourished, NAD, pleasant ENMT:  TMs intact, normal light reflex, no erythema, no bulging. Nasal turbinates moist, congestion. MMM, Oropharynx clear without erythema or tonsillar exudate/hypertrophy Eyes: Conjunctiva non-injected. PERRL.  Cardio: RRR, no m/r/g noted.  Pulm: No increased WOB.  CTAB, without wheezes, rhonchi or crackles noted.  GI: soft, NT/ND,+BS x4, no hepatomegaly, no splenomegaly Skin: dry, intact, no rashes or lesions   Assessment/Plan: Sinusitis I do not suspect this is bacterial infection given current symptoms, exam, and duration of time. The patient states she has a h/o sinus infection and is adamant that Flonase and saline rinses aren't helpful.  - discussed supportive care  - attempt flonase and nasal saline again - Tylenol or ibuprofen as needed. - may attempt to use Afrin, discussed no more than 3 days due to rebound. - return precautions discussed.     No orders of the defined types were placed in this encounter.   Meds ordered this encounter  Medications  . oxymetazoline (AFRIN NASAL SPRAY) 0.05 % nasal spray    Sig: Place 1 spray into both nostrils 2 (two) times daily. For no more than 3 days in a row    Dispense:  30 mL    Refill:  [place] PGY-3, [place]

## 2016-01-11 NOTE — Patient Instructions (Signed)
It was nice to meet you.  I believe that your symptoms were consistent with a viral rhinosinusitis. Unfortunate, do not begin around it would help you at this time. Things like then Nettie pot can help, as well as Flonase. I have prescribed Afrin to help, however he can only take this for 3 days total, after this it can lead to rebound symptoms that were worse than the initial symptoms.  I sincerely hope that this helps with her symptoms. Please follow-up with Dr. Gwendlyn Deutscher or one of the other providers in our clinic if her symptoms do not improve in the last 2 weeks or worsen   Sinusitis, Adult Sinusitis is redness, soreness, and inflammation of the paranasal sinuses. Paranasal sinuses are air pockets within the bones of your face. They are located beneath your eyes, in the middle of your forehead, and above your eyes. In healthy paranasal sinuses, mucus is able to drain out, and air is able to circulate through them by way of your nose. However, when your paranasal sinuses are inflamed, mucus and air can become trapped. This can allow bacteria and other germs to grow and cause infection. Sinusitis can develop quickly and last only a short time (acute) or continue over a long period (chronic). Sinusitis that lasts for more than 12 weeks is considered chronic. CAUSES Causes of sinusitis include:  Allergies.  Structural abnormalities, such as displacement of the cartilage that separates your nostrils (deviated septum), which can decrease the air flow through your nose and sinuses and affect sinus drainage.  Functional abnormalities, such as when the small hairs (cilia) that line your sinuses and help remove mucus do not work properly or are not present. SIGNS AND SYMPTOMS Symptoms of acute and chronic sinusitis are the same. The primary symptoms are pain and pressure around the affected sinuses. Other symptoms include:  Upper toothache.  Earache.  Headache.  Bad breath.  Decreased sense of  smell and taste.  A cough, which worsens when you are lying flat.  Fatigue.  Fever.  Thick drainage from your nose, which often is green and may contain pus (purulent).  Swelling and warmth over the affected sinuses. DIAGNOSIS Your health care provider will perform a physical exam. During your exam, your health care provider may perform any of the following to help determine if you have acute sinusitis or chronic sinusitis:  Look in your nose for signs of abnormal growths in your nostrils (nasal polyps).  Tap over the affected sinus to check for signs of infection.  View the inside of your sinuses using an imaging device that has a light attached (endoscope). If your health care provider suspects that you have chronic sinusitis, one or more of the following tests may be recommended:  Allergy tests.  Nasal culture. A sample of mucus is taken from your nose, sent to a lab, and screened for bacteria.  Nasal cytology. A sample of mucus is taken from your nose and examined by your health care provider to determine if your sinusitis is related to an allergy. TREATMENT Most cases of acute sinusitis are related to a viral infection and will resolve on their own within 10 days. Sometimes, medicines are prescribed to help relieve symptoms of both acute and chronic sinusitis. These may include pain medicines, decongestants, nasal steroid sprays, or saline sprays. However, for sinusitis related to a bacterial infection, your health care provider will prescribe antibiotic medicines. These are medicines that will help kill the bacteria causing the infection. Rarely, sinusitis is  caused by a fungal infection. In these cases, your health care provider will prescribe antifungal medicine. For some cases of chronic sinusitis, surgery is needed. Generally, these are cases in which sinusitis recurs more than 3 times per year, despite other treatments. HOME CARE INSTRUCTIONS  Drink plenty of water. Water  helps thin the mucus so your sinuses can drain more easily.  Use a humidifier.  Inhale steam 3-4 times a day (for example, sit in the bathroom with the shower running).  Apply a warm, moist washcloth to your face 3-4 times a day, or as directed by your health care provider.  Use saline nasal sprays to help moisten and clean your sinuses.  Take medicines only as directed by your health care provider.  If you were prescribed either an antibiotic or antifungal medicine, finish it all even if you start to feel better. SEEK IMMEDIATE MEDICAL CARE IF:  You have increasing pain or severe headaches.  You have nausea, vomiting, or drowsiness.  You have swelling around your face.  You have vision problems.  You have a stiff neck.  You have difficulty breathing.   This information is not intended to replace advice given to you by your health care provider. Make sure you discuss any questions you have with your health care provider.   Document Released: 03/05/2005 Document Revised: 03/26/2014 Document Reviewed: 03/20/2011 Elsevier Interactive Patient Education [employer].

## 2016-01-13 NOTE — Assessment & Plan Note (Addendum)
I do not suspect this is bacterial infection given current symptoms, exam, and duration of time. The patient states she has a h/o sinus infection and is adamant that Flonase and saline rinses aren't helpful.  - discussed supportive care  - attempt flonase and nasal saline again - Tylenol or ibuprofen as needed. - may attempt to use Afrin, discussed no more than 3 days due to rebound. - return precautions discussed.

## 2016-01-27 ENCOUNTER — Ambulatory Visit: Payer: Self-pay

## 2016-01-30 ENCOUNTER — Telehealth: Payer: Self-pay | Admitting: Obstetrics and Gynecology

## 2016-01-30 ENCOUNTER — Ambulatory Visit (INDEPENDENT_AMBULATORY_CARE_PROVIDER_SITE_OTHER): Payer: 59 | Admitting: Family Medicine

## 2016-01-30 ENCOUNTER — Encounter: Payer: Self-pay | Admitting: Family Medicine

## 2016-01-30 VITALS — BP 121/71 | HR 64 | Temp 98.2°F | Wt 207.0 lb

## 2016-01-30 DIAGNOSIS — N3001 Acute cystitis with hematuria: Secondary | ICD-10-CM | POA: Diagnosis not present

## 2016-01-30 DIAGNOSIS — R300 Dysuria: Secondary | ICD-10-CM | POA: Diagnosis not present

## 2016-01-30 LAB — POCT URINALYSIS DIPSTICK
Bilirubin, UA: NEGATIVE
Glucose, UA: NEGATIVE
Ketones, UA: NEGATIVE
Nitrite, UA: NEGATIVE
Protein, UA: NEGATIVE
Spec Grav, UA: 1.01
Urobilinogen, UA: 0.2
pH, UA: 6.5

## 2016-01-30 LAB — POCT UA - MICROSCOPIC ONLY: WBC, Ur, HPF, POC: >20

## 2016-01-30 MED ORDER — NITROFURANTOIN MONOHYD MACRO 100 MG PO CAPS: 100.0000 mg | ORAL_CAPSULE | Freq: Two times a day (BID) | ORAL | 0 refills | Status: AC

## 2016-01-30 MED ORDER — PHENAZOPYRIDINE HCL 100 MG PO TABS: 100.0000 mg | ORAL_TABLET | Freq: Three times a day (TID) | ORAL | 0 refills | Status: DC | PRN

## 2016-01-30 MED ORDER — FLUCONAZOLE 150 MG PO TABS: 150.0000 mg | ORAL_TABLET | Freq: Once | ORAL | 0 refills | Status: AC

## 2016-01-30 MED FILL — PHENAZOPYRIDINE 100 MG TAB: 100 | 3 days supply | Qty: 10 | Fill #0

## 2016-01-30 MED FILL — NITROFURANTOIN MONO-MCR 100: 100 | 7 days supply | Qty: 14 | Fill #0

## 2016-01-30 NOTE — Telephone Encounter (Signed)
Spoke with patient. Patient states that she is currently at her PCP for evaluation of burning with urination. Reports she is having her urine checked and sent for culture. She would like to schedule an appointment to see Dr.Silva when she returns to the office to discuss surgical options. Appointment scheduled for 02/17/2016 at 3 pm with Dr.Silva. Patient is agreeable to date and time. She will return call to the office if she has any questions or concerns before her appointment with Dr.Silva.  Routing to provider for final review. Patient agreeable to disposition. Will close encounter.

## 2016-01-30 NOTE — Progress Notes (Signed)
   HPI  CC: urinary symptoms  [name] is a 55 y.o. , with the above CC.  Symptoms started yesterday afternoon about 5-6pm. Suprapubic pressure with frequency of urnation but incomplete voiding/emptying. Discomfort when walking suprapubically. +dysuria. Denies fevers/chills. Denies CVAT. Has had UTIs previously, feels similar. Denies abnormal vaginal discharge or bleeding.  Has known stress incontinence long-term. Was on Myrbetrique but no longer. Used to see urology.  The following portions of the patient's history were reviewed and updated as appropriate: allergies, current medications, past family history, past medical history, past social history, past surgical history and problem list.  ROS: A 12-point review of systems was performed and negative, except as stated in the above HPI.  OBJECTIVE BP 121/71   Pulse 64   Temp 98.2 F (36.8 C) (Oral)   Wt 207 lb (93.9 kg)   BMI 33.92 kg/m  Gen: NAD, alert, cooperative, and pleasant. HEENT: NCAT, EOMI, PERRL CV: RRR, no murmur Resp: CTAB, no wheezes, non-labored Abd: SNTND, BS present, no guarding or organomegaly; +suprapubic tenderness and pressure on palpation. No CVAT bilaterally.  Ext: No edema, warm Neuro: Alert and oriented, Speech clear, No gross deficits   ASSESSMENT AND PLAN: 1. Acute cystitis with hematuria - nitrofurantoin, macrocrystal-monohydrate, (MACROBID) 100 MG capsule; Take 1 capsule (100 mg total) by mouth 2 (two) times daily.  Dispense: 14 capsule; Refill: 0 - fluconazole (DIFLUCAN) 150 MG tablet; Take 1 tablet (150 mg total) by mouth once.  Dispense: 1 tablet; Refill: 0 - phenazopyridine (PYRIDIUM) 100 MG tablet; Take 1 tablet (100 mg total) by mouth 3 (three) times daily as needed for pain.  Dispense: 10 tablet; Refill: 0  2. Dysuria - Urinalysis Dipstick - Urine culture - POCT UA - Microscopic Only    Zenda Alpers, [facility] 01/30/2016 11:57  AM

## 2016-01-30 NOTE — Telephone Encounter (Signed)
Patient is having some bladder issues. °

## 2016-01-30 NOTE — Patient Instructions (Signed)

## 2016-02-01 LAB — URINE CULTURE

## 2016-02-03 ENCOUNTER — Encounter (HOSPITAL_COMMUNITY): Payer: Self-pay | Admitting: *Deleted

## 2016-02-03 ENCOUNTER — Emergency Department (HOSPITAL_COMMUNITY)
Admission: EM | Admit: 2016-02-03 | Discharge: 2016-02-03 | Disposition: A | Discharge status: Home / Self Care | Payer: 59 | Attending: Emergency Medicine | Admitting: Emergency Medicine

## 2016-02-03 ENCOUNTER — Emergency Department (HOSPITAL_COMMUNITY): Payer: 59

## 2016-02-03 DIAGNOSIS — R109 Unspecified abdominal pain: Secondary | ICD-10-CM | POA: Diagnosis not present

## 2016-02-03 DIAGNOSIS — Z7982 Long term (current) use of aspirin: Secondary | ICD-10-CM | POA: Diagnosis not present

## 2016-02-03 DIAGNOSIS — Z7984 Long term (current) use of oral hypoglycemic drugs: Secondary | ICD-10-CM | POA: Insufficient documentation

## 2016-02-03 DIAGNOSIS — Z87891 Personal history of nicotine dependence: Secondary | ICD-10-CM | POA: Insufficient documentation

## 2016-02-03 DIAGNOSIS — E119 Type 2 diabetes mellitus without complications: Secondary | ICD-10-CM | POA: Insufficient documentation

## 2016-02-03 DIAGNOSIS — N12 Tubulo-interstitial nephritis, not specified as acute or chronic: Secondary | ICD-10-CM | POA: Insufficient documentation

## 2016-02-03 LAB — CBC
HCT: 37.4 % (ref 36.0–46.0)
Hemoglobin: 12.5 g/dL (ref 12.0–15.0)
MCH: 28.5 pg (ref 26.0–34.0)
MCHC: 33.4 g/dL (ref 30.0–36.0)
MCV: 85.4 fL (ref 78.0–100.0)
Platelets: 349 10*3/uL (ref 150–400)
RBC: 4.38 MIL/uL (ref 3.87–5.11)
RDW: 14.1 % (ref 11.5–15.5)
WBC: 9.6 10*3/uL (ref 4.0–10.5)

## 2016-02-03 LAB — URINE MICROSCOPIC-ADD ON

## 2016-02-03 LAB — COMPREHENSIVE METABOLIC PANEL
ALT: 11 U/L — ABNORMAL LOW (ref 14–54)
AST: 15 U/L (ref 15–41)
Albumin: 3.8 g/dL (ref 3.5–5.0)
Alkaline Phosphatase: 81 U/L (ref 38–126)
Anion gap: 7 (ref 5–15)
BUN: 9 mg/dL (ref 6–20)
CO2: 23 mmol/L (ref 22–32)
Calcium: 9.3 mg/dL (ref 8.9–10.3)
Chloride: 109 mmol/L (ref 101–111)
Creatinine, Ser: 0.97 mg/dL (ref 0.44–1.00)
GFR calc Af Amer: >60 mL/min (ref >60)
GFR calc non Af Amer: >60 mL/min (ref >60)
Glucose, Bld: 102 mg/dL — ABNORMAL HIGH (ref 65–99)
Potassium: 3.7 mmol/L (ref 3.5–5.1)
Sodium: 139 mmol/L (ref 135–145)
Total Bilirubin: 0.3 mg/dL (ref 0.3–1.2)
Total Protein: 7 g/dL (ref 6.5–8.1)

## 2016-02-03 LAB — LIPASE, BLOOD: Lipase: 24 U/L (ref 11–51)

## 2016-02-03 LAB — URINALYSIS, ROUTINE W REFLEX MICROSCOPIC
Bilirubin Urine: NEGATIVE
Glucose, UA: NEGATIVE mg/dL
Ketones, ur: NEGATIVE mg/dL
Nitrite: POSITIVE — AB
Protein, ur: NEGATIVE mg/dL
Specific Gravity, Urine: 1.007 (ref 1.005–1.030)
pH: 6 (ref 5.0–8.0)

## 2016-02-03 LAB — CBG MONITORING, ED: Glucose-Capillary: 101 mg/dL — ABNORMAL HIGH (ref 65–99)

## 2016-02-03 MED ORDER — ONDANSETRON 4 MG PO TBDP
8.0000 mg | ORAL_TABLET | Freq: Once | ORAL | Status: AC
  Administered 2016-02-03: 8 mg via ORAL
  Filled 2016-02-03: qty 2

## 2016-02-03 MED ORDER — CIPROFLOXACIN HCL 500 MG PO TABS: 500.0000 mg | ORAL_TABLET | Freq: Two times a day (BID) | ORAL | 0 refills | Status: DC

## 2016-02-03 MED ORDER — DEXTROSE 5 % IV SOLN
1.0000 g | Freq: Once | INTRAVENOUS | Status: AC
  Administered 2016-02-03: 1 g via INTRAVENOUS
  Filled 2016-02-03: qty 10

## 2016-02-03 MED ORDER — OXYCODONE-ACETAMINOPHEN 5-325 MG PO TABS
1.0000 | ORAL_TABLET | Freq: Once | ORAL | Status: AC
  Administered 2016-02-03: 1 via ORAL
  Filled 2016-02-03: qty 1

## 2016-02-03 MED ORDER — SODIUM CHLORIDE 0.9 % IV BOLUS (SEPSIS)
500.0000 mL | Freq: Once | INTRAVENOUS | Status: AC
  Administered 2016-02-03: 500 mL via INTRAVENOUS

## 2016-02-03 MED ORDER — KETOROLAC TROMETHAMINE 15 MG/ML IJ SOLN
15.0000 mg | Freq: Once | INTRAMUSCULAR | Status: AC
  Administered 2016-02-03: 15 mg via INTRAVENOUS
  Filled 2016-02-03: qty 1

## 2016-02-03 MED ORDER — MORPHINE SULFATE (PF) 4 MG/ML IV SOLN
6.0000 mg | Freq: Once | INTRAVENOUS | Status: AC
  Administered 2016-02-03: 6 mg via INTRAVENOUS
  Filled 2016-02-03: qty 2

## 2016-02-03 NOTE — ED Triage Notes (Signed)
The pt is c/o urinary frequency lt flank pain since Monday.  She saw her doctor but since then her pain has increased and she also has blood in her urine

## 2016-02-03 NOTE — ED Provider Notes (Signed)
[facility] DEPT Provider Note   CSN: [id number] Arrival date & time: 02/03/16  1550     History   Chief Complaint Chief Complaint  Patient presents with  . Flank Pain    HPI [name] is a 55 y.o. female.  Patient presents with worsening left flank pain and nausea. No history of kidney stone. No anterior abdominal pain. Pain intermittent but gradually worsening. Mild blood in her urine.      Past Medical History:  Diagnosis Date  . Anxiety   . Arthritis   . Depression   . Diabetes mellitus without complication ([name])   . GERD (gastroesophageal reflux disease)   . H/O varicella   . History of measles, mumps, or rubella   . Hx of ovarian cyst   . Hyperlipidemia   . Migraine headache   . Sleep apnea    pt denies  . Sleep deprivation    loss of sleep  . 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 Sinusitis 01/11/2016  . URI (upper respiratory infection) 08/12/2015  . Hypersomnia 07/19/2015  . Lung nodule 07/19/2015  . Cough 07/19/2015  . Sleep apnea 05/27/2015  . Solitary pulmonary nodule 05/27/2015  . Hot flashes 05/27/2015  . Chest pain with moderate risk for cardiac etiology   . Chest pain 05/15/2015  . GERD (gastroesophageal reflux disease) 05/13/2015  . Type 2 diabetes mellitus with hyperglycemia, without long-term current use of insulin ([place]) 04/12/2015  . Urine, incontinence, stress female 06/18/2014  . Depression with anxiety 01/22/2014  . VAIN I (vaginal intraepithelial neoplasia grade I)   . Hyperlipidemia 10/22/2013  . Fatigue   . MIGRAINE HEADACHE 10/04/2008    Past Surgical History:  Procedure Laterality Date  . ABDOMINAL HYSTERECTOMY    . CHOLECYSTECTOMY  11/13/10  . COLONOSCOPY  Multiple  . DILATION AND CURETTAGE OF UTERUS    . OOPHORECTOMY  2009   bilateral salpingo-oophorectomy.  . TUBAL LIGATION      OB  History    Gravida Para Term Preterm AB Living   3 2 2   1 2    SAB TAB Ectopic Multiple Live Births           2       Home Medications    Prior to Admission medications   Medication Sig Start Date End Date Taking? Authorizing Provider  Albuterol Sulfate (PROAIR RESPICLICK) 123XX123 (90 Base) MCG/ACT AEPB Inhale 2 puffs into the lungs every 6 (six) hours as needed. Patient taking differently: Inhale 2 puffs into the lungs every 6 (six) hours as needed. For shortness of breath 06/12/15  Yes Robyn Haber, MD  aspirin-acetaminophen-caffeine [facility] MIGRAINE) [phone] MG tablet Take 1 tablet by mouth daily as needed for headache or migraine. Reported on 05/27/2015   Yes Historical Provider, MD  estradiol (ESTRACE) 1 MG tablet Take 1 tablet (1 mg total) by mouth 2 (two) times daily. 10/28/15  Yes [place], MD  meloxicam (MOBIC) 7.5 MG tablet Take 7.5 mg by mouth 2 (two) times daily. Reported on 05/27/2015   Yes Historical Provider, MD  metFORMIN (GLUCOPHAGE-XR) 500 MG 24 hr tablet Take 1 tablet (500 mg total) by mouth 2 (two) times daily with a meal. 08/12/15  Yes Philemon Kingdom, MD  nitrofurantoin, macrocrystal-monohydrate, (MACROBID) 100 MG capsule Take 1 capsule (100 mg total) by mouth 2 (two) times daily.  01/30/16 02/06/16 Yes Elizabeth Woodland Mumaw, DO  pantoprazole (PROTONIX) 40 MG tablet Take 1 tablet (40 mg total) by mouth daily before breakfast. 07/12/15  Yes Gatha Mayer, MD  phenazopyridine (PYRIDIUM) 100 MG tablet Take 1 tablet (100 mg total) by mouth 3 (three) times daily as needed for pain. 01/30/16  Yes Elizabeth Woodland Mumaw, DO  Pitavastatin Calcium (LIVALO) 2 MG TABS Take 1 tablet (2 mg total) by mouth daily. 09/14/15  Yes Philemon Kingdom, MD  BAYER MICROLET LANCETS lancets Use to test blood sugar once daily as instructed. 07/14/15   Philemon Kingdom, MD  ciprofloxacin (CIPRO) 500 MG tablet Take 1 tablet (500 mg total) by mouth 2 (two) times daily. One po bid x  7 days 02/03/16   Elnora Morrison, MD  glucose blood (BAYER CONTOUR TEST) test strip Use 2x a day 02/03/15   Philemon Kingdom, MD  nystatin-triamcinolone ointment [facility]) Apply 1 application topically 2 (two) times daily. Patient not taking: Reported on 02/03/2016 11/15/15   Kem Boroughs, FNP  oxymetazoline (AFRIN NASAL SPRAY) 0.05 % nasal spray Place 1 spray into both nostrils 2 (two) times daily. For no more than 3 days in a row Patient not taking: Reported on 02/03/2016 01/11/16   Archie Patten, MD  valACYclovir (VALTREX) 1000 MG tablet Take 1 tablet (1,000 mg total) by mouth 2 (two) times daily. Take for 10 days Patient not taking: Reported on 02/03/2016 11/15/15   Kem Boroughs, FNP    Family History Family History  Problem Relation Age of Onset  . Hypertension Mother   . Hypotension Mother   . Anemia Mother     low iron  . Heart disease Sister   . Sickle cell trait Other   . Alcohol abuse Brother   . Alcohol abuse Brother   . Drug abuse Brother   . Colon cancer Neg Hx     Social History Social History  Substance Use Topics  . Smoking status: Former Smoker    Packs/day: 0.50    Types: Cigarettes  . Smokeless tobacco: Never Used  . Alcohol use 0.0 oz/week     Comment: rarely     Allergies   Sulfa antibiotics   Review of Systems Review of Systems  Constitutional: Negative for chills and fever.  HENT: Negative for ear pain and sore throat.   Eyes: Negative for pain and visual disturbance.  Respiratory: Negative for cough and shortness of breath.   Cardiovascular: Negative for chest pain and palpitations.  Gastrointestinal: Negative for abdominal pain and vomiting.  Genitourinary: Positive for hematuria. Negative for dysuria.  Musculoskeletal: Positive for back pain. Negative for arthralgias.  Skin: Negative for color change and rash.  Neurological: Negative for seizures and syncope.  All other systems reviewed and are negative.    Physical Exam Updated  Vital Signs BP 129/85   Pulse 66   Temp 98.2 F (36.8 C) (Oral)   Resp 16   Ht 5\' 5"  (1.651 m)   Wt 207 lb (93.9 kg)   SpO2 99%   BMI 34.45 kg/m   Physical Exam  Constitutional: She appears well-developed and well-nourished. No distress.  HENT:  Head: Normocephalic and atraumatic.  Eyes: Conjunctivae are normal.  Neck: Neck supple.  Cardiovascular: Normal rate and regular rhythm.   No murmur heard. Pulmonary/Chest: Effort normal and breath sounds normal. No respiratory distress.  Abdominal: Soft. There is tenderness (mild left flank).  Musculoskeletal: She exhibits no edema.  Neurological: She is alert.  Skin: Skin is warm  and dry.  Psychiatric: She has a normal mood and affect.  Nursing note and vitals reviewed.    ED Treatments / Results  Labs (all labs ordered are listed, but only abnormal results are displayed) Labs Reviewed  COMPREHENSIVE METABOLIC PANEL - Abnormal; Notable for the following:       Result Value   Glucose, Bld 102 (*)    ALT 11 (*)    All other components within normal limits  URINALYSIS, ROUTINE W REFLEX MICROSCOPIC (NOT AT [facility]) - Abnormal; Notable for the following:    Color, Urine AMBER (*)    APPearance HAZY (*)    Hgb urine dipstick MODERATE (*)    Nitrite POSITIVE (*)    Leukocytes, UA LARGE (*)    All other components within normal limits  URINE MICROSCOPIC-ADD ON - Abnormal; Notable for the following:    Squamous Epithelial / LPF 0-5 (*)    Bacteria, UA RARE (*)    All other components within normal limits  CBG MONITORING, ED - Abnormal; Notable for the following:    Glucose-Capillary 101 (*)    All other components within normal limits  URINE CULTURE  LIPASE, BLOOD  CBC    EKG  EKG Interpretation None       Radiology Ct Renal Stone Study  Result Date: 02/03/2016 CLINICAL DATA:  Left-sided flank pain for 3 days. Nausea. Hematuria. EXAM: CT ABDOMEN AND PELVIS WITHOUT CONTRAST TECHNIQUE: Multidetector CT imaging of the  abdomen and pelvis was performed following the standard protocol without IV contrast. COMPARISON:  10/20/2012 FINDINGS: Lower chest: Clear lung bases. Normal heart size without pericardial or pleural effusion. Hepatobiliary: Well-circumscribed hypo attenuating liver lesions are likely cysts and are similar. Hepatomegaly at 19.0 cm craniocaudal. Cholecystectomy, without biliary ductal dilatation. Pancreas: Normal, without mass or ductal dilatation. Spleen: Normal in size, without focal abnormality. Adrenals/Urinary Tract: Normal adrenal glands. No renal calculi or hydronephrosis. Interpolar right renal lesion is too small to characterize. A lower pole left renal lesion has enlarged minimally but is fluid density and likely a cyst. 1.3 cm on image 36/series 2. No hydroureter or ureteric calculi. No bladder calculi. Stomach/Bowel: Normal stomach, without wall thickening. Cecum extends into the central pelvis. Normal terminal ileum. The appendix is normal, including on image 54/series 2. Small bowel is positioned within an area of ventral abdominal wall laxity. No obstruction or other acute complication. Example image 44/series 2. Vascular/Lymphatic: Normal caliber of the aorta and branch vessels. No abdominopelvic adenopathy. Reproductive: Partial hysterectomy.  No adnexal mass. Other: No significant free fluid. Musculoskeletal: No acute osseous abnormality. IMPRESSION: 1.  No urinary tract calculi or hydronephrosis. 2. No other explanation for left-sided pain. 3. Small bowel positioned within an area of ventral abdominal wall laxity. No acute complication. Electronically Signed   By: Abigail Miyamoto M.D.   On: 02/03/2016 20:28    Procedures Procedures (including critical care time)  Medications Ordered in ED Medications  ondansetron (ZOFRAN-ODT) disintegrating tablet 8 mg (8 mg Oral Given 02/03/16 1615)  oxyCODONE-acetaminophen (PERCOCET/ROXICET) 5-325 MG per tablet 1 tablet (1 tablet Oral Given 02/03/16 1615)    cefTRIAXone (ROCEPHIN) 1 g in dextrose 5 % 50 mL IVPB (1 g Intravenous New Bag/Given 02/03/16 2059)  sodium chloride 0.9 % bolus 500 mL (500 mLs Intravenous New Bag/Given 02/03/16 2052)  ketorolac (TORADOL) 15 MG/ML injection 15 mg (15 mg Intravenous Given 02/03/16 2052)  morphine 4 MG/ML injection 6 mg (6 mg Intravenous Given 02/03/16 2052)     Initial Impression /  Assessment and Plan / ED Course  I have reviewed the triage vital signs and the nursing notes.  Pertinent labs & imaging results that were available during my care of the patient were reviewed by me and considered in my medical decision making (see chart for details).  Clinical Course    Patient presents with clinical concern for pyelonephritis versus kidney stone. Urinalysis is infected. CT scan obtained to look for sign of infected kidney stone. CT scan did not show a kidney stone. Rocephin given in oral anabiotic's. No fever or vomiting in the ER.  Results and differential diagnosis were discussed with the patient/parent/guardian. Xrays were independently reviewed by myself.  Close follow up outpatient was discussed, comfortable with the plan.   Medications  ondansetron (ZOFRAN-ODT) disintegrating tablet 8 mg (8 mg Oral Given 02/03/16 1615)  oxyCODONE-acetaminophen (PERCOCET/ROXICET) 5-325 MG per tablet 1 tablet (1 tablet Oral Given 02/03/16 1615)  cefTRIAXone (ROCEPHIN) 1 g in dextrose 5 % 50 mL IVPB (1 g Intravenous New Bag/Given 02/03/16 2059)  sodium chloride 0.9 % bolus 500 mL (500 mLs Intravenous New Bag/Given 02/03/16 2052)  ketorolac (TORADOL) 15 MG/ML injection 15 mg (15 mg Intravenous Given 02/03/16 2052)  morphine 4 MG/ML injection 6 mg (6 mg Intravenous Given 02/03/16 2052)    Vitals:   02/03/16 1555 02/03/16 1921 02/03/16 2115 02/03/16 2130  BP: 133/91 129/87 121/77 129/85  Pulse: 76 63 61 66  Resp: 18 16    Temp: 98.2 F (36.8 C) 98.2 F (36.8 C)    TempSrc: Oral Oral    SpO2: 98% 100% 100% 99%   Weight: 207 lb (93.9 kg)     Height: 5\' 5"  (1.651 m)       Final diagnoses:  Acute left flank pain  Pyelonephritis    Final Clinical Impressions(s) / ED Diagnoses   Final diagnoses:  Acute left flank pain  Pyelonephritis    New Prescriptions New Prescriptions   CIPROFLOXACIN (CIPRO) 500 MG TABLET    Take 1 tablet (500 mg total) by mouth 2 (two) times daily. One po bid x 7 days     Elnora Morrison, MD 02/03/16 2203

## 2016-02-03 NOTE — Discharge Instructions (Signed)
If you were given medicines take as directed.  If you are on coumadin or contraceptives realize their levels and effectiveness is altered by many different medicines.  If you have any reaction (rash, tongues swelling, other) to the medicines stop taking and see a physician.    If your blood pressure was elevated in the ER make sure you follow up for management with a primary doctor or return for chest pain, shortness of breath or stroke symptoms.  Please follow up as directed and return to the ER or see a physician for new or worsening symptoms.  Thank you. Vitals:   02/03/16 1555 02/03/16 1921  BP: 133/91 129/87  Pulse: 76 63  Resp: 18 16  Temp: 98.2 F (36.8 C) 98.2 F (36.8 C)  TempSrc: Oral Oral  SpO2: 98% 100%  Weight: 207 lb (93.9 kg)   Height: 5\' 5"  (1.651 m)

## 2016-02-05 LAB — URINE CULTURE: Culture: <10000 — AB

## 2016-02-06 MED FILL — FLUCONAZOLE 150 MG TABLET: 150 | 1 days supply | Qty: 1 | Fill #0

## 2016-02-06 MED FILL — CIPROFLOXACIN HCL 500 MG TA: 500 | 7 days supply | Qty: 14 | Fill #0

## 2016-02-07 ENCOUNTER — Telehealth: Payer: Self-pay | Admitting: *Deleted

## 2016-02-07 ENCOUNTER — Telehealth: Payer: Self-pay

## 2016-02-07 NOTE — Telephone Encounter (Signed)
Left message for pt to call me back to inform her to switch her UTI antibiotics.

## 2016-02-07 NOTE — Telephone Encounter (Signed)
LM for patient to call back regarding medication change.   Will try calling her back later today or tomorrow. Pallie Swigert,CMA

## 2016-02-07 NOTE — Telephone Encounter (Signed)
-----   Message from [facility], DO sent at 02/06/2016 11:38 PM EST ----- Please call patient to see if she switched antibiotics from [place] to Cipro (given in ER), due to urine culture showed resistance to Macrobid. Please explain to patient. Thank you. Dr. Vanetta Shawl

## 2016-02-07 NOTE — Telephone Encounter (Signed)
-----   Message from [facility], DO sent at 02/06/2016 11:38 PM EST ----- Please call patient to see if she switched antibiotics from [place] to Cipro (given in ER), due to urine culture showed resistance to Macrobid. Please explain to patient. Thank you. Dr. Vanetta Shawl

## 2016-02-15 ENCOUNTER — Encounter: Payer: Self-pay | Admitting: *Deleted

## 2016-02-15 NOTE — Telephone Encounter (Signed)
mychart message sent to patient since never received call back from her. Jazmin Hartsell,CMA

## 2016-02-17 ENCOUNTER — Ambulatory Visit: Payer: Self-pay

## 2016-02-17 ENCOUNTER — Ambulatory Visit: Payer: 59 | Admitting: Obstetrics and Gynecology

## 2016-02-23 ENCOUNTER — Other Ambulatory Visit: Payer: Self-pay | Admitting: Internal Medicine

## 2016-02-23 MED FILL — CONTOUR TEST STRIPS: 90 days supply | Qty: 200 | Fill #0

## 2016-02-23 MED FILL — MICROLET LANCETS: 90 days supply | Qty: 100 | Fill #1

## 2016-02-23 MED FILL — METFORMIN HCL ER 500 MG TAB: 500 | 90 days supply | Qty: 180 | Fill #1

## 2016-02-24 ENCOUNTER — Other Ambulatory Visit: Payer: Self-pay

## 2016-02-24 VITALS — BP 118/78 | HR 74 | Resp 14 | Ht 65.5 in | Wt 208.4 lb

## 2016-02-24 DIAGNOSIS — E1165 Type 2 diabetes mellitus with hyperglycemia: Secondary | ICD-10-CM

## 2016-02-24 NOTE — Patient Instructions (Signed)
1. Plan to eat 30-45 GM (2-3 servings) of carbohydrate a meal and 15 GM for snacks.  Plan to eat snacks with protein. 2. Plan to check blood sugars 1-2 times a day fasting or 1  -2 hours after eating with goals of less than 100 fasting or 140 or less after meals 3. Plan to walk 3 days a week for 30 minutes and plan to join the [facility] and go 3 times a week.  Goal is to 150 minutes a week 4. Plan to see Dr. Cruzita Lederer on 04/06/16 5. Plan to be followed through the [facility] program

## 2016-02-24 NOTE — Patient Outreach (Signed)
[facility]) Care Management   02/24/2016  [name] 01/24/1961 [id number]  [name] is an 55 y.o. female.   Member seen for follow up office visit for Link to Wellness program for self management of Type 2 diabetes  Subjective: Member states that she saw Dr. Cruzita Lederer in September and her hemoglobin A1C was 6.4%.  States that she is to see her in January.  States that she has enrolled in the [employer] program.  States her blood sugars have been good and range from 85-140.  States she is trying to walk at work 3 times a week for 30 minutes.    Objective:   Review of Systems  Genitourinary: Positive for flank pain.    Physical Exam Today's Vitals   02/24/16 1227 02/24/16 1234  BP: 118/78   Pulse: 74   Resp: 14   SpO2: 95%   Weight: 208 lb 6.4 oz (94.5 kg)   Height: 1.664 m (5' 5.5")   PainSc: 0-No pain 0-No pain   Encounter Medications:   Outpatient Encounter Prescriptions as of 02/24/2016  Medication Sig  . Albuterol Sulfate (PROAIR RESPICLICK) 123XX123 (90 Base) MCG/ACT AEPB Inhale 2 puffs into the lungs every 6 (six) hours as needed. (Patient taking differently: Inhale 2 puffs into the lungs every 6 (six) hours as needed. For shortness of breath)  . aspirin-acetaminophen-caffeine (EXCEDRIN MIGRAINE) 250-250-65 MG tablet Take 1 tablet by mouth daily as needed for headache or migraine. Reported on 05/27/2015  . BAYER CONTOUR TEST test strip USE TO TEST TWICE DAILY  . BAYER MICROLET LANCETS lancets Use to test blood sugar once daily as instructed.  Marland Kitchen estradiol (ESTRACE) 1 MG tablet Take 1 tablet (1 mg total) by mouth 2 (two) times daily.  . meloxicam (MOBIC) 7.5 MG tablet Take 7.5 mg by mouth 2 (two) times daily. Reported on 05/27/2015  . metFORMIN (GLUCOPHAGE-XR) 500 MG 24 hr tablet Take 1 tablet (500 mg total) by mouth 2 (two) times daily with a meal.  . Pitavastatin Calcium (LIVALO) 2 MG TABS Take 1 tablet (2 mg total) by mouth daily.  . ciprofloxacin  (CIPRO) 500 MG tablet Take 1 tablet (500 mg total) by mouth 2 (two) times daily. One po bid x 7 days (Patient not taking: Reported on 02/24/2016)  . nystatin-triamcinolone ointment (MYCOLOG) Apply 1 application topically 2 (two) times daily. (Patient not taking: Reported on 02/24/2016)  . oxymetazoline (AFRIN NASAL SPRAY) 0.05 % nasal spray Place 1 spray into both nostrils 2 (two) times daily. For no more than 3 days in a row (Patient not taking: Reported on 02/24/2016)  . pantoprazole (PROTONIX) 40 MG tablet Take 1 tablet (40 mg total) by mouth daily before breakfast. (Patient not taking: Reported on 02/24/2016)  . phenazopyridine (PYRIDIUM) 100 MG tablet Take 1 tablet (100 mg total) by mouth 3 (three) times daily as needed for pain. (Patient not taking: Reported on 02/24/2016)  . valACYclovir (VALTREX) 1000 MG tablet Take 1 tablet (1,000 mg total) by mouth 2 (two) times daily. Take for 10 days (Patient not taking: Reported on 02/24/2016)   No facility-administered encounter medications on file as of 02/24/2016.     Functional Status:   In your present state of health, do you have any difficulty performing the following activities: 02/24/2016 10/28/2015  Hearing? N N  Vision? N N  Difficulty concentrating or making decisions? N N  Walking or climbing stairs? N N  Dressing or bathing? N N  Doing errands, shopping? N  N  Some recent data might be hidden    Fall/Depression Screening:    PHQ 2/9 Scores 02/24/2016 01/11/2016 10/28/2015 08/12/2015 06/30/2015 06/12/2015 05/27/2015  PHQ - 2 Score 0 0 0 0 0 0 0  PHQ- 9 Score - - - - - - -    Assessment:  Member seen for follow up office visit for Link to Wellness program for self management of Type 2 DM. Member with hemoglobin A1C of 6.4% at last endocrinologist visit. Reports adherence with low CHO diet Reports she is walking 3 times  a week for 30- 45 minutes.  Reports CBGs ranging 85-105 fasting. Member to see endocrinologist on 04/06/16.  Member has  enrolled in the [place] program.     Plan:  1. Plan to eat 30-45 GM (2-3 servings) of carbohydrate a meal and 15 GM for snacks.  Plan to eat snacks with protein. 2. Plan to check blood sugars 1-2 times a day fasting or 1  -2 hours after eating with goals of less than 100 fasting or 140 or less after meals 3. Plan to walk 3 days a week for 30 minutes and plan to join the [facility] and go 3 times a week.  Goal is to 150 minutes a week 4. Plan to see Dr. Cruzita Lederer on 04/06/16 5. Plan to be followed through the [facility] program  [facility] CM Care Plan Problem One   Flowsheet Row Most Recent Value  Care Plan Problem One  Potential for elevated blood sugars related to dx of prediabetes  Role Documenting the Problem One  Care Management [place] for Problem One  Active  [facility] Long Term Goal (31-90 days)  Member will maintain hemoglobin A1C at or below 6.4 until next Link to Wellness visit  [place] Term Goal Start Date  02/24/16 [name] hemoglobin A1C 6.4%]  Interventions for Problem One Long Term Goal  Reviewed CHO counting and portion control, Reinforced on importance of regular exercise for glycemic control, Instructed to schedule eye exam for next year, Reinforced to eat protein with her snacks, Given handout on the [facility] program and instructed that she will be followed through the [place] program next year     [name] RN, [facility] Care Management Coordinator-Link to [place] Management [phone]

## 2016-03-02 ENCOUNTER — Ambulatory Visit: Payer: 59 | Admitting: Obstetrics and Gynecology

## 2016-03-02 ENCOUNTER — Telehealth: Payer: Self-pay | Admitting: Obstetrics and Gynecology

## 2016-03-02 NOTE — Telephone Encounter (Signed)
Patient called and cancelled her appointment for "bladder problems" on 03/09/16.  She said, "I don't need this appointment right now, I'm doing better.  I will call back to reschedule if I need to."  FYI only.

## 2016-03-02 NOTE — Telephone Encounter (Signed)
Thank you for the update.  Encounter closed. 

## 2016-03-09 ENCOUNTER — Ambulatory Visit: Payer: 59 | Admitting: Obstetrics and Gynecology

## 2016-03-19 DIAGNOSIS — D332 Benign neoplasm of brain, unspecified: Secondary | ICD-10-CM

## 2016-03-19 HISTORY — DX: Benign neoplasm of brain, unspecified: D33.2

## 2016-03-23 ENCOUNTER — Encounter: Payer: Self-pay | Admitting: Family Medicine

## 2016-03-23 ENCOUNTER — Ambulatory Visit (INDEPENDENT_AMBULATORY_CARE_PROVIDER_SITE_OTHER): Payer: 59 | Admitting: Family Medicine

## 2016-03-23 VITALS — BP 138/86 | HR 57 | Temp 97.9°F | Ht 65.0 in | Wt 211.6 lb

## 2016-03-23 DIAGNOSIS — G43801 Other migraine, not intractable, with status migrainosus: Secondary | ICD-10-CM

## 2016-03-23 MED ORDER — KETOROLAC TROMETHAMINE 30 MG/ML IJ SOLN
30.0000 mg | Freq: Once | INTRAMUSCULAR | Status: AC
  Administered 2016-03-23: 30 mg via INTRAMUSCULAR

## 2016-03-23 MED ORDER — DEXAMETHASONE SODIUM PHOSPHATE 10 MG/ML IJ SOLN
10.0000 mg | Freq: Once | INTRAMUSCULAR | Status: AC
  Administered 2016-03-23: 10 mg via INTRAMUSCULAR

## 2016-03-23 NOTE — Progress Notes (Signed)
   Subjective:    Patient ID: [name] , female   DOB: October 19, 1960 , 56 y.o..   MRN: [id number]  HPI  [name] is here for  Chief Complaint  Patient presents with  . Headache    Migraines  .  HEADACHE  Headache started 4 days ago Pain is throbbing  Severity:  7/10 Location: bilateral front head Medications tried: Excedrin migraine Head trauma: no Sudden onset: no Previous similar headaches: yes, 1 year ago Taking blood thinners: no History of cancer: no  Symptoms Nose congestion stuffiness: None Nausea vomiting: some nausea yesterday Photophobia: Some sensitivity to light Noise sensitivity: Yes Double vision or loss of vision: No Fever: No Neck Stiffness: No Trouble walking or speaking: No  Patient thinks cause of headache might be: migraines Has tried Imitrex in the past and did not like how it made her feel Her last migraine was 1 year ago  Review of Symptoms - see HPI PMH - Smoking status noted.    Medications: reviewed  Social Hx:  reports that she has quit smoking. Her smoking use included Cigarettes. She smoked 0.50 packs per day. She has never used smokeless tobacco.   Objective:   BP 138/86 (BP Location: Left Arm, Patient Position: Sitting, Cuff Size: Normal)   Pulse (!) 57   Temp 97.9 F (36.6 C) (Oral)   Ht 5\' 5"  (1.651 m)   Wt 211 lb 9.6 oz (96 kg)   SpO2 99%   BMI 35.21 kg/m  Physical Exam  Gen: NAD, alert, cooperative with exam, well-appearing HEENT: NCAT, PERRL, clear conjunctiva, oropharynx clear, supple neck Neurological: CN 2-12 intact, normal sensation throughout, 5/5 strength in knee extension and 5/5 grip strength bilaterally, normal gait.  Psych: good insight, normal mood and affect   Assessment & Plan:  Migraine headache Typical migraine headache. No neurological deficits. Last migraine was 1 year ago. Does not like Imitrex when she tried it in the past.  Administrations This Visit    dexamethasone (DECADRON)  injection 10 mg    Admin Date 03/23/2016 Action Given Dose 10 mg Route Intramuscular Administered By Velora Heckler, RN       ketorolac (TORADOL) 30 MG/ML injection 30 mg    Admin Date 03/23/2016 Action Given Dose 30 mg Route Intramuscular Administered By Velora Heckler, RN         - Return to clinic if migraine persists - Red flag symptoms and reasons to go to the hospital discussed - Consider referral to neuro vs prophylactic medication if migraine recurs in the next few months.   Smitty Cords, MD [place], PGY-2

## 2016-03-23 NOTE — Patient Instructions (Signed)
Thank you for coming in today, it was so nice to see you! Today we talked about:    Migraine: We have given you medications to help your migraine. If your migraine gets worse or you develop weakness or numbness, please go to the emergency room  If you have any questions or concerns, please do not hesitate to call the office at [phone]. You can also message me directly via MyChart.   Sincerely,  Smitty Cords, MD   Migraine Headache A migraine headache is an intense, throbbing pain on one side or both sides of the head. Migraines may also cause other symptoms, such as nausea, vomiting, and sensitivity to light and noise. What are the causes? Doing or taking certain things may also trigger migraines, such as:  Alcohol.  Smoking.  Medicines, such as:  Medicine used to treat chest pain (nitroglycerine).  Birth control pills.  Estrogen pills.  Certain blood pressure medicines.  Aged cheeses, chocolate, or caffeine.  Foods or drinks that contain nitrates, glutamate, aspartame, or tyramine.  Physical activity. Other things that may trigger a migraine include:  Menstruation.  Pregnancy.  Hunger.  Stress, lack of sleep, too much sleep, or fatigue.  Weather changes. What increases the risk? The following factors may make you more likely to experience migraine headaches:  Age. Risk increases with age.  Family history of migraine headaches.  Being Caucasian.  Depression and anxiety.  Obesity.  Being a woman.  Having a hole in the heart (patent foramen ovale) or other heart problems. What are the signs or symptoms? The main symptom of this condition is pulsating or throbbing pain. Pain may:  Happen in any area of the head, such as on one side or both sides.  Interfere with daily activities.  Get worse with physical activity.  Get worse with exposure to bright lights or loud noises. Other symptoms may  include:  Nausea.  Vomiting.  Dizziness.  General sensitivity to bright lights, loud noises, or smells. Before you get a migraine, you may get warning signs that a migraine is developing (aura). An aura may include:  Seeing flashing lights or having blind spots.  Seeing bright spots, halos, or zigzag lines.  Having tunnel vision or blurred vision.  Having numbness or a tingling feeling.  Having trouble talking.  Having muscle weakness. How is this diagnosed? A migraine headache can be diagnosed based on:  Your symptoms.  A physical exam.  Tests, such as CT scan or MRI of the head. These imaging tests can help rule out other causes of headaches.  Taking fluid from the spine (lumbar puncture) and analyzing it (cerebrospinal fluid analysis, or CSF analysis). How is this treated? A migraine headache is usually treated with medicines that:  Relieve pain.  Relieve nausea.  Prevent migraines from coming back. Treatment may also include:  Acupuncture.  Lifestyle changes like avoiding foods that trigger migraines. Follow these instructions at home: Medicines  Take over-the-counter and prescription medicines only as told by your health care provider.  Do not drive or use heavy machinery while taking prescription pain medicine.  To prevent or treat constipation while you are taking prescription pain medicine, your health care provider may recommend that you:  Drink enough fluid to keep your urine clear or pale yellow.  Take over-the-counter or prescription medicines.  Eat foods that are high in fiber, such as fresh fruits and vegetables, whole grains, and beans.  Limit foods that are high in fat and processed sugars, such as  fried and sweet foods. Lifestyle  Avoid alcohol use.  Do not use any products that contain nicotine or tobacco, such as cigarettes and e-cigarettes. If you need help quitting, ask your health care provider.  Get at least 8 hours of sleep  every night.  Limit your stress. General instructions  Keep a journal to find out what may trigger your migraine headaches. For example, write down:  What you eat and drink.  How much sleep you get.  Any change to your diet or medicines.  If you have a migraine:  Avoid things that make your symptoms worse, such as bright lights.  It may help to lie down in a dark, quiet room.  Do not drive or use heavy machinery.  Ask your health care provider what activities are safe for you while you are experiencing symptoms.  Keep all follow-up visits as told by your health care provider. This is important. Contact a health care provider if:  You develop symptoms that are different or more severe than your usual migraine symptoms. Get help right away if:  Your migraine becomes severe.  You have a fever.  You have a stiff neck.  You have vision loss.  Your muscles feel weak or like you cannot control them.  You start to lose your balance often.  You develop trouble walking.  You faint. This information is not intended to replace advice given to you by your health care provider. Make sure you discuss any questions you have with your health care provider. Document Released: 03/05/2005 Document Revised: 09/23/2015 Document Reviewed: 08/22/2015 Elsevier Interactive Patient Education  2017 [employer].

## 2016-03-23 NOTE — Assessment & Plan Note (Signed)
Typical migraine headache. No neurological deficits. Last migraine was 1 year ago. Does not like Imitrex when she tried it in the past.  Administrations This Visit    dexamethasone (DECADRON) injection 10 mg    Admin Date 03/23/2016 Action Given Dose 10 mg Route Intramuscular Administered By Velora Heckler, RN       ketorolac (TORADOL) 30 MG/ML injection 30 mg    Admin Date 03/23/2016 Action Given Dose 30 mg Route Intramuscular Administered By Velora Heckler, RN         - Return to clinic if migraine persists - Red flag symptoms and reasons to go to the hospital discussed - Consider referral to neuro vs prophylactic medication if migraine recurs in the next few months.

## 2016-03-26 ENCOUNTER — Telehealth: Payer: Self-pay | Admitting: Family Medicine

## 2016-03-26 NOTE — Telephone Encounter (Signed)
I am off this afternoon. Please put her on my Friday schedule. If symptom worsens advise her to go to the ED. In the mean time have her take Ibuprofen 400mg  prn headache.  [facility] team please contact patient today with information and schedule appointment with me for her. Thanks a lot.

## 2016-03-26 NOTE — Telephone Encounter (Signed)
Spoke with pt and scheduled her an apt for this coming Friday,1/12 with PCP. I offered pt an apt sooner (same day) but pt declined. Pt advised to go to ED if sxs worsen throughout the week.

## 2016-03-26 NOTE — Telephone Encounter (Signed)
Pt is calling because she was seen last week for migraines she was given 2 shots. This has not helped at all and now she has had this migraine for over a week now. Please call her at work. [phone] jw

## 2016-03-27 ENCOUNTER — Encounter: Payer: Self-pay | Admitting: Family Medicine

## 2016-03-27 ENCOUNTER — Ambulatory Visit (INDEPENDENT_AMBULATORY_CARE_PROVIDER_SITE_OTHER): Payer: 59 | Admitting: Family Medicine

## 2016-03-27 DIAGNOSIS — G43801 Other migraine, not intractable, with status migrainosus: Secondary | ICD-10-CM | POA: Diagnosis not present

## 2016-03-27 DIAGNOSIS — R112 Nausea with vomiting, unspecified: Secondary | ICD-10-CM

## 2016-03-27 MED ORDER — PROMETHAZINE HCL 25 MG PO TABS: 25.0000 mg | ORAL_TABLET | Freq: Three times a day (TID) | ORAL | 0 refills | Status: DC | PRN

## 2016-03-27 MED ORDER — BUTALBITAL-APAP-CAFFEINE 50-325-40 MG PO TABS: 1.0000 | ORAL_TABLET | Freq: Four times a day (QID) | ORAL | 0 refills | Status: DC | PRN

## 2016-03-27 MED ORDER — TOPIRAMATE 25 MG PO CPSP: 25.0000 mg | ORAL_CAPSULE | Freq: Two times a day (BID) | ORAL | 1 refills | Status: DC

## 2016-03-27 MED FILL — PROMETHAZINE 25 MG TABLET: 25 | 10 days supply | Qty: 30 | Fill #0

## 2016-03-27 MED FILL — BUTALBITAL/APAP/CAFFEINE TB: 50-325-40 | 5 days supply | Qty: 20 | Fill #0

## 2016-03-27 MED FILL — TOPIRAMATE 25 MG TABLET: 25 | 30 days supply | Qty: 60 | Fill #0

## 2016-03-27 NOTE — Patient Instructions (Signed)
Recurrent Migraine Headache A migraine headache is very bad, throbbing pain on one or both sides of your head. Recurrent migraines keep coming back. Talk to your doctor about what things may bring on (trigger) your migraine headaches. Follow these instructions at home:  Only take medicines as told by your doctor.  Lie down in a dark, quiet room when you have a migraine.  Keep a journal to find out if certain things bring on migraine headaches. For example, write down:  What you eat and drink.  How much sleep you get.  Any change to your diet or medicines.  Lessen how much alcohol you drink.  Quit smoking if you smoke.  Get enough sleep.  Lessen any stress in your life.  Keep lights dim if bright lights bother you or make your migraines worse. Contact a doctor if:  Medicine does not help your migraines.  Your pain keeps coming back.  You have a fever. Get help right away if:  Your migraine becomes really bad.  You have a stiff neck.  You have trouble seeing.  Your muscles are weak, or you lose muscle control.  You lose your balance or have trouble walking.  You feel like you will pass out (faint), or you pass out.  You have really bad symptoms that are different than your first symptoms. This information is not intended to replace advice given to you by your health care provider. Make sure you discuss any questions you have with your health care provider. Document Released: 12/13/2007 Document Revised: 08/11/2015 Document Reviewed: 11/10/2012 Elsevier Interactive Patient Education  2017 [employer].

## 2016-03-27 NOTE — Assessment & Plan Note (Signed)
Likely triggered by her headache. Phenergan prescribed prn N/V. Keep well hydrated. F/U soon if no improvement.

## 2016-03-27 NOTE — Assessment & Plan Note (Signed)
Chronic recurrent. No neurologic deficit. No signs of meningeal irritation. Try Fioricet prn headache. Phenergan prn N/V. Start Topamax prophylaxis. Rest at home and keep well hydrated. Return precaution discussed.

## 2016-03-27 NOTE — Progress Notes (Signed)
Subjective:     Patient ID: [name], female   DOB: Aug 30, 1960, 56 y.o.   MRN: [id number]  Migraine   This is a recurrent (Been having migraine headache for 10-12 years. Last episode was 1 year ago. Most recent started about 1 week ago) problem. The current episode started in the past 7 days. The problem occurs constantly. The problem has been unchanged. The pain is located in the bilateral, frontal and parietal region. The pain does not radiate. The pain quality is similar to prior headaches (Migraine headache for 12 years). The quality of the pain is described as aching. The pain is at a severity of 8/10. The pain is moderate. Associated symptoms include dizziness, nausea, phonophobia, photophobia and vomiting. Pertinent negatives include no anorexia, coughing, drainage, ear pain, eye pain, eye redness, eye watering, facial sweating, fever, hearing loss, loss of balance, numbness, seizures or visual change. Associated symptoms comments: She throws up 1-2 times daily, last episode was last night. Exacerbated by: light and noice and certain smells. She has tried NSAIDs (Excedrine. She used Imitrex in the past which makes it worse) for the symptoms. The treatment provided mild relief. Her past medical history is significant for migraine headaches and migraines in the family. There is no history of hypertension or recent head traumas.  She was seen few days ago here and given steroid shot without any improvement. She had been to the headache specialist in the past, and botulinum injection was recommended hence she did not return to them.  Current Outpatient Prescriptions on File Prior to Visit  Medication Sig Dispense Refill  . estradiol (ESTRACE) 1 MG tablet Take 1 tablet (1 mg total) by mouth 2 (two) times daily. 180 tablet 3  . metFORMIN (GLUCOPHAGE-XR) 500 MG 24 hr tablet Take 1 tablet (500 mg total) by mouth 2 (two) times daily with a meal. 180 tablet 1  . Pitavastatin Calcium (LIVALO) 2 MG  TABS Take 1 tablet (2 mg total) by mouth daily. 30 tablet 2  . valACYclovir (VALTREX) 1000 MG tablet Take 1 tablet (1,000 mg total) by mouth 2 (two) times daily. Take for 10 days 20 tablet 0  . Albuterol Sulfate (PROAIR RESPICLICK) 123XX123 (90 Base) MCG/ACT AEPB Inhale 2 puffs into the lungs every 6 (six) hours as needed. (Patient not taking: Reported on 03/27/2016) 1 each 3  . aspirin-acetaminophen-caffeine (EXCEDRIN MIGRAINE) 250-250-65 MG tablet Take 1 tablet by mouth daily as needed for headache or migraine. Reported on 05/27/2015    . BAYER CONTOUR TEST test strip USE TO TEST TWICE DAILY 100 each 11  . BAYER MICROLET LANCETS lancets Use to test blood sugar once daily as instructed. 100 each 3  . meloxicam (MOBIC) 7.5 MG tablet Take 7.5 mg by mouth 2 (two) times daily. Reported on 05/27/2015    . oxymetazoline (AFRIN NASAL SPRAY) 0.05 % nasal spray Place 1 spray into both nostrils 2 (two) times daily. For no more than 3 days in a row (Patient not taking: Reported on 03/27/2016) 30 mL 0  . pantoprazole (PROTONIX) 40 MG tablet Take 1 tablet (40 mg total) by mouth daily before breakfast. (Patient not taking: Reported on 03/27/2016) 90 tablet 3   No current facility-administered medications on file prior to visit.    Past Medical History:  Diagnosis Date  . Anxiety   . Arthritis   . Depression   . Diabetes mellitus without complication ([place])   . GERD (gastroesophageal reflux disease)   . H/O varicella   .  History of measles, mumps, or rubella   . Hx of ovarian cyst   . Hyperlipidemia   . Migraine headache   . Sleep apnea    pt denies  . Sleep deprivation    loss of sleep  . 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  HENT: Negative for ear pain and hearing loss.   Eyes: Positive for photophobia. Negative for pain and redness.  Respiratory: Negative for cough.   Gastrointestinal:  Positive for nausea and vomiting. Negative for anorexia.  Neurological: Positive for dizziness. Negative for seizures, numbness and loss of balance.   Vitals:   03/27/16 0916  BP: 118/60  Pulse: 81  Temp: 97.4 F (36.3 C)  TempSrc: Oral  SpO2: 98%  Weight: 213 lb 6.4 oz (96.8 kg)       Objective:   Physical Exam  Constitutional: She is oriented to person, place, and time. She appears well-developed. No distress.  HENT:  Head: Normocephalic and atraumatic.    Right Ear: Tympanic membrane, external ear and ear canal normal.  Left Ear: Tympanic membrane, external ear and ear canal normal.  Mouth/Throat: Uvula is midline, oropharynx is clear and moist and mucous membranes are normal. No oropharyngeal exudate.  Eyes: Conjunctivae and EOM are normal. Pupils are equal, round, and reactive to light.  Neck: Normal range of motion. Neck supple. No Brudzinski's sign and no Kernig's sign noted.  Cardiovascular: Normal rate, regular rhythm, normal heart sounds and normal pulses.   No murmur heard. Pulmonary/Chest: Effort normal and breath sounds normal.  Abdominal: Bowel sounds are normal. There is no tenderness.  Musculoskeletal: Normal range of motion. She exhibits no edema.  Neurological: She is alert and oriented to person, place, and time. She has normal strength. She displays abnormal reflex. No cranial nerve deficit or sensory deficit. She displays a negative Romberg sign. GCS eye subscore is 4. GCS verbal subscore is 5. GCS motor subscore is 6.  Nursing note and vitals reviewed.      Assessment:     Migraine: Chronic and recurrent Nausea with vomiting    Plan:     Check problem list.

## 2016-03-28 ENCOUNTER — Encounter: Payer: Self-pay | Admitting: Family Medicine

## 2016-03-28 NOTE — Patient Instructions (Signed)
During her visit yesterday, she requested completion of [employer] form on her behalf for Migraine headache. She dropped to form later yesterday and it was completed today per her request. A copy will be on file.

## 2016-03-30 ENCOUNTER — Ambulatory Visit: Payer: Self-pay | Admitting: Family Medicine

## 2016-04-02 ENCOUNTER — Ambulatory Visit (INDEPENDENT_AMBULATORY_CARE_PROVIDER_SITE_OTHER): Payer: 59 | Admitting: Internal Medicine

## 2016-04-02 ENCOUNTER — Encounter: Payer: Self-pay | Admitting: Internal Medicine

## 2016-04-02 VITALS — BP 132/78 | HR 74 | Temp 98.0°F | Ht 65.0 in | Wt 213.4 lb

## 2016-04-02 DIAGNOSIS — Z23 Encounter for immunization: Secondary | ICD-10-CM

## 2016-04-02 DIAGNOSIS — E119 Type 2 diabetes mellitus without complications: Secondary | ICD-10-CM | POA: Diagnosis not present

## 2016-04-02 DIAGNOSIS — M79671 Pain in right foot: Secondary | ICD-10-CM | POA: Diagnosis not present

## 2016-04-02 NOTE — Patient Instructions (Signed)
[name],  I do believe you have plantar fasciitis. This can takes a few months to improve. If you don't get relief from ibuprofen, icing, and exercises, please call for referral for steroid injection. Heel inserts can help too.  Best, Dr. Ola Spurr   Plantar Fasciitis Plantar fasciitis is a painful foot condition that affects the heel. It occurs when the band of tissue that connects the toes to the heel bone (plantar fascia) becomes irritated. This can happen after exercising too much or doing other repetitive activities (overuse injury). The pain from plantar fasciitis can range from mild irritation to severe pain that makes it difficult for you to walk or move. The pain is usually worse in the morning or after you have been sitting or lying down for a while. CAUSES This condition may be caused by:  Standing for long periods of time.  Wearing shoes that do not fit.  Doing high-impact activities, including running, aerobics, and ballet.  Being overweight.  Having an abnormal way of walking (gait).  Having tight calf muscles.  Having high arches in your feet.  Starting a new athletic activity. SYMPTOMS The main symptom of this condition is heel pain. Other symptoms include:  Pain that gets worse after activity or exercise.  Pain that is worse in the morning or after resting.  Pain that goes away after you walk for a few minutes. DIAGNOSIS This condition may be diagnosed based on your signs and symptoms. Your health care provider will also do a physical exam to check for:  A tender area on the bottom of your foot.  A high arch in your foot.  Pain when you move your foot.  Difficulty moving your foot. You may also need to have imaging studies to confirm the diagnosis. These can include:  X-rays.  Ultrasound.  MRI. TREATMENT  Treatment for plantar fasciitis depends on the severity of the condition. Your treatment may include:  Rest, ice, and over-the-counter  pain medicines to manage your pain.  Exercises to stretch your calves and your plantar fascia.  A splint that holds your foot in a stretched, upward position while you sleep (night splint).  Physical therapy to relieve symptoms and prevent problems in the future.  Cortisone injections to relieve severe pain.  Extracorporeal shock wave therapy (ESWT) to stimulate damaged plantar fascia with electrical impulses. It is often used as a last resort before surgery.  Surgery, if other treatments have not worked after 12 months. HOME CARE INSTRUCTIONS  Take medicines only as directed by your health care provider.  Avoid activities that cause pain.  Roll the bottom of your foot over a bag of ice or a bottle of cold water. Do this for 20 minutes, 3-4 times a day.  Perform simple stretches as directed by your health care provider.  Try wearing athletic shoes with air-sole or gel-sole cushions or soft shoe inserts.  Wear a night splint while sleeping, if directed by your health care provider.  Keep all follow-up appointments with your health care provider. PREVENTION   Do not perform exercises or activities that cause heel pain.  Consider finding low-impact activities if you continue to have problems.  Lose weight if you need to. The best way to prevent plantar fasciitis is to avoid the activities that aggravate your plantar fascia. SEEK MEDICAL CARE IF:  Your symptoms do not go away after treatment with home care measures.  Your pain gets worse.  Your pain affects your ability to move or do  your daily activities. This information is not intended to replace advice given to you by your health care provider. Make sure you discuss any questions you have with your health care provider. Document Released: 11/28/2000 Document Revised: 06/27/2015 Document Reviewed: 01/13/2014 Elsevier Interactive Patient Education  2017 [employer].   Plantar Fasciitis Rehab Ask your health care  provider which exercises are safe for you. Do exercises exactly as told by your health care provider and adjust them as directed. It is normal to feel mild stretching, pulling, tightness, or discomfort as you do these exercises, but you should stop right away if you feel sudden pain or your pain gets worse. Do not begin these exercises until told by your health care provider. Stretching and range of motion exercises These exercises warm up your muscles and joints and improve the movement and flexibility of your foot. These exercises also help to relieve pain. Exercise A: Plantar fascia stretch 1. Sit with your left / right leg crossed over your opposite knee. 2. Hold your heel with one hand with that thumb near your arch. With your other hand, hold your toes and gently pull them back toward the top of your foot. You should feel a stretch on the bottom of your toes or your foot or both. 3. Hold this stretch for__________ seconds. 4. Slowly release your toes and return to the starting position. Repeat __________ times. Complete this exercise __________ times a day. Exercise B: Gastroc, standing 1. Stand with your hands against a wall. 2. Extend your left / right leg behind you, and bend your front knee slightly. 3. Keeping your heels on the floor and keeping your back knee straight, shift your weight toward the wall without arching your back. You should feel a gentle stretch in your left / right calf. 4. Hold this position for __________ seconds. Repeat __________ times. Complete this exercise __________ times a day. Exercise C: Soleus, standing 1. Stand with your hands against a wall. 2. Extend your left / right leg behind you, and bend your front knee slightly. 3. Keeping your heels on the floor, bend your back knee and slightly shift your weight over the back leg. You should feel a gentle stretch deep in your calf. 4. Hold this position for __________ seconds. Repeat __________ times. Complete  this exercise __________ times a day. Exercise D: Gastrocsoleus, standing 1. Stand with the ball of your left / right foot on a step. The ball of your foot is on the walking surface, right under your toes. 2. Keep your other foot firmly on the same step. 3. Hold onto the wall or a railing for balance. 4. Slowly lift your other foot, allowing your body weight to press your heel down over the edge of the step. You should feel a stretch in your left / right calf. 5. Hold this position for __________ seconds. 6. Return both feet to the step. 7. Repeat this exercise with a slight bend in your left / right knee. Repeat __________ times with your left / right knee straight and __________ times with your left / right knee bent. Complete this exercise __________ times a day. Balance exercise This exercise builds your balance and strength control of your arch to help take pressure off your plantar fascia. Exercise E: Single leg stand 1. Without shoes, stand near a railing or in a doorway. You may hold onto the railing or door frame as needed. 2. Stand on your left / right foot. Keep your big toe  down on the floor and try to keep your arch lifted. Do not let your foot roll inward. 3. Hold this position for __________ seconds. 4. If this exercise is too easy, you can try it with your eyes closed or while standing on a pillow. Repeat __________ times. Complete this exercise __________ times a day. This information is not intended to replace advice given to you by your health care provider. Make sure you discuss any questions you have with your health care provider. Document Released: 03/05/2005 Document Revised: 11/08/2015 Document Reviewed: 01/17/2015 Elsevier Interactive Patient Education  2017 [employer].

## 2016-04-02 NOTE — Progress Notes (Signed)
[facility] Progress Note  Subjective:  [name] is a 56 y.o. female who presents for R foot pain.  #R foot pain: - Began over the weekend. Says she was doing laundry and all of a sudden had pain near her heel. - Pain worse with bearing weight and walking. Wearing high heels makes worse. - Pain extends up back of leg to calf when walking but mostly near ball of foot.  - Improves with keeping feet up - Hasn't tried anything yet for pain except wearing flat shoes - Has never had a DVT - No increase in physical activity - Hx of foot paresthesias and R lower back pain after fall in 2013 that has since resolved. Had been evaluated by Neurology and had normal EMG conduction studies, negative for R LE neuropathy or R lumbosacral radiculopathy.  ROS: No SOB, no rashes   # Diabetes: - Due for urine microalbumin - Due for pneumococcal vaccine - Due for foot exam  #Health Maintenance: Says she had TDAP and Hep C screening in 2011  Past Medical History:  Diagnosis Date  . Anxiety   . Arthritis   . Depression   . Diabetes mellitus without complication ([place])   . GERD (gastroesophageal reflux disease)   . H/O varicella   . History of measles, mumps, or rubella   . Hx of ovarian cyst   . Hyperlipidemia   . Migraine headache   . Sleep apnea    pt denies  . Sleep deprivation    loss of sleep  . VAIN I (vaginal intraepithelial neoplasia grade I) 2013   pap and confirmed by colposcopic biopsy   Social: Former smoker  Allergies  Allergen Reactions  . Sulfa Antibiotics Anaphylaxis, Hives and Swelling    Objective: Blood pressure 132/78, pulse 74, temperature 98 F (36.7 C), temperature source Oral, height 5\' 5"  (1.651 m), weight 213 lb 6.4 oz (96.8 kg). Body mass index is 35.51 kg/m. Constitutional: Obese female, in NAD Cardiovascular: RRR, S1, S2, no m/r/g.  Pulmonary/Chest: No respiratory distress.  Musculoskeletal: TTP across dorsum of R foot especially  at anterior calcaneus. Discomfort with dorsi and plantar flexion, as well as inversion and eversion of R foot. No swelling or redness of calves, ankles bilaterally.  Neurological: AOx3. Peripheral sensation intact though could feel monofilament testing better at L great toe than R.  Skin: Skin is warm and dry. No bruising or swelling noted.  Vitals reviewed  Assessment/Plan: Foot pain - Given location of pain, suspect R plantar fasciitis. Though patient also complains of pain at back of leg, unlikely to have DVT as this is mostly around Achilles tendon and no swelling or redness of calf.  - Recommended NSAIDs, trying heel insert in shoe, ice, and provided handouts on stretching exercises - Advised patient symptoms usually last for months - Asked her to call if she would like referral to Sports Medicine for steroid injection if no improvement with conservative management  Diabetes: Ordered urine microalbumin but was not performed.  Health Maintenance: Will request TDAP and Hep C testing records  Follow-up in about 1 month to assess for improvement.  Olene Floss, MD [place], PGY-2

## 2016-04-04 DIAGNOSIS — M79673 Pain in unspecified foot: Secondary | ICD-10-CM | POA: Insufficient documentation

## 2016-04-04 NOTE — Assessment & Plan Note (Addendum)
-   Given location of pain, suspect R plantar fasciitis. Though patient also complains of pain at back of leg, unlikely to have DVT as this is mostly around Achilles tendon and no swelling or redness of calf.  - Recommended NSAIDs, trying heel insert in shoe, ice, and provided handouts on stretching exercises - Advised patient symptoms usually last for months - Asked her to call if she would like referral to Sports Medicine for steroid injection if no improvement with conservative management

## 2016-04-06 ENCOUNTER — Ambulatory Visit (INDEPENDENT_AMBULATORY_CARE_PROVIDER_SITE_OTHER): Payer: 59 | Admitting: Internal Medicine

## 2016-04-06 ENCOUNTER — Encounter: Payer: Self-pay | Admitting: Internal Medicine

## 2016-04-06 VITALS — BP 124/84 | HR 68 | Wt 212.0 lb

## 2016-04-06 DIAGNOSIS — E785 Hyperlipidemia, unspecified: Secondary | ICD-10-CM | POA: Diagnosis not present

## 2016-04-06 DIAGNOSIS — E1165 Type 2 diabetes mellitus with hyperglycemia: Secondary | ICD-10-CM | POA: Diagnosis not present

## 2016-04-06 DIAGNOSIS — E119 Type 2 diabetes mellitus without complications: Secondary | ICD-10-CM | POA: Diagnosis not present

## 2016-04-06 LAB — POCT GLYCOSYLATED HEMOGLOBIN (HGB A1C): Hemoglobin A1C: 5.9

## 2016-04-06 MED ORDER — PITAVASTATIN CALCIUM 2 MG PO TABS: 2.0000 mg | ORAL_TABLET | Freq: Every day | ORAL | 11 refills | Status: DC

## 2016-04-06 MED FILL — LIVALO 2 MG TABLET: 2 | 30 days supply | Qty: 30 | Fill #0

## 2016-04-06 NOTE — Patient Instructions (Addendum)
Patient Instructions  Please continue:  - Metformin ER 500 mg 2x a day - Livalo 2 mg daily  Please return in 4 months with your sugar log.

## 2016-04-06 NOTE — Progress Notes (Signed)
Patient ID: [name], female   DOB: December 24, 1960, 56 y.o.   MRN: [id number]  HPI: [name] is a 56 y.o.-year-old female, returning for f/u for DM2; prediabetes dx early 2000s, controlled, without complications and also hyperlipidemia. Last visit 4 mo ago.  She has plantar fasciitis.   Last hemoglobin A1c was: Lab Results  Component Value Date   HGBA1C 6.4 12/13/2015   HGBA1C 6.3 08/12/2015   HGBA1C 6.3 04/26/2015  She had several steroid inj in last year- last in 04/2014.   She is on: - Metformin ER 500 mg 2x a day  - She had diarrhea/nausea with regular metformin.  Prev. also on: - Tradjenta 5 mg in am, before b'fast  - Invokana 100 mg in am - started 03/2015 >> she had CP, HA, lightheadedness  Pt checks sugars 0-1x a day: - am:70-90 >> 165-180 >> 85-105 >> 74-95 >> 85-95 >> 95-105 >> 80-90s - 2h after b'fast: n/c >> 78-95 n/c >> 120-145 >> n/c - lunch: 95-101 >> n/c >> 135-155 >> 77-95 >> 66, 85-95 >> 95-100 >> 95-100 - 2h after lunch: 190 >> 135-155 >> 87-95, 110  >> n/c >> 120-145 >> 95-100 - before dinner (snacking): 170s >> 80-100 >> n/c - 2h after dinner:  220-230 >> 90-114 >> 90s-100 >> 120-145 >> n/c - bedtime: 85-98 >> 85-100 No lows. Lowest sugar was 69 >> 85 >> 80s; ? she has hypoglycemia awareness.  Highest: 190 >> 230 >> 114 >> 100 >> 145 >> 145.  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   - no CKD, last BUN/creatinine:  Lab Results  Component Value Date   BUN 9 02/03/2016   CREATININE 0.97 02/03/2016   - last eye exam was in 04/2015. No DR. - no numbness and tingling in feet.  HL: Reviewed last 3 sets of lipids. She is on Livalo.  She is compliant with this.   Lab Results  Component Value Date    CHOL 179 04/26/2015   CHOL 266 (H) 02/03/2015   CHOL 197 02/05/2014   Lab Results  Component Value Date   HDL 42.60 04/26/2015   HDL 44.40 02/03/2015   HDL 45 02/05/2014   Lab Results  Component Value Date   LDLCALC 123 (H) 04/26/2015   LDLCALC 196 (H) 02/03/2015   LDLCALC 117 (H) 02/05/2014   Lab Results  Component Value Date   TRIG 66.0 04/26/2015   TRIG 128.0 02/03/2015   TRIG 177 (H) 02/05/2014   Lab Results  Component Value Date   CHOLHDL 4 04/26/2015   CHOLHDL 6 02/03/2015   CHOLHDL 4.4 02/05/2014   Her last LFTs are normal: Lab Results  Component Value Date   ALT 11 (L) 02/03/2016   AST 15 02/03/2016   ALKPHOS 81 02/03/2016   BILITOT 0.3 02/03/2016   Has FH of HL in sister.  I reviewed pt's medications, allergies, PMH, social hx, family hx, and changes were documented in the history of present illness. Otherwise, unchanged from my initial visit note.  ROS: Constitutional: no weight gain/loss, no fatigue, + hot flushes Eyes: no blurry vision, no xerophthalmia ENT: no sore throat, no nodules palpated in throat, no dysphagia/odynophagia Cardiovascular: no CP/SOB/palpitations/no leg swelling Respiratory: no cough/no  SOB/wheezing Gastrointestinal: no N/V/D/C/heartburn Musculoskeletal: no muscle/no joint aches, no back pain; + poot pain Skin: no rashes Neurological: no tremors/numbness/tingling/dizziness, + HA  I reviewed pt's medications, allergies, PMH, social hx, family hx, and changes were documented in the history of present illness. Otherwise, unchanged from my initial visit note.  PE: BP 124/84 (BP Location: Left Arm, Patient Position: Sitting)   Pulse 68   Wt 212 lb (96.2 kg)   SpO2 98%   BMI 35.28 kg/m  Body mass index is 35.28 kg/m. Wt Readings from Last 3 Encounters:  04/06/16 212 lb (96.2 kg)  04/02/16 213 lb 6.4 oz (96.8 kg)  03/27/16 213 lb 6.4 oz (96.8 kg)   Constitutional: overweight, in NAD Eyes: PERRLA, EOMI, no  exophthalmos ENT: moist mucous membranes, no thyromegaly, no cervical lymphadenopathy Cardiovascular: RRR, No MRG Respiratory: CTA B Gastrointestinal: abdomen soft, NT, ND, BS+ Musculoskeletal: no deformities, strength intact in all 4 Skin: moist, warm, no rashes Neurological: no tremor with outstretched hands, DTR normal in all 4  ASSESSMENT: 1. DM2, non-insulin dependent, without complications, controlled  2. HL  PLAN:  1. Patient with DM2, on metformin ER,previously also on Tradjenta and Invokana, but sugars improved >> now only on Metformin low dose Sugars continue to stay great! No lows. Will continue current regimen. - I suggested to: Patient Instructions  Please continue:  - Metformin ER 500 mg 2x a day - Livalo 2 mg daily  Please return in 4 months with your sugar log  - continue checking sugars at different times of the day - check 1x day, rotating checks - UTD with yearly eye exams >> UTD - check HbA1c today >> 5.9% (great!) - Return to clinic in 4 mo with sugar log  2. HL - Pt with high LDL >> improved after switching to Livalo (better influence on her sugars than Pravastatin) and changed her diet  - reviewed latest Lipid panel with the pt >> improved - will continue Livalo - will order a new Lipid panel >> she will come back fasting for this  Orders Placed This Encounter  Procedures  . Lipid Panel   Philemon Kingdom, MD PhD [facility]

## 2016-04-06 NOTE — Addendum Note (Signed)
Addended by: Caprice Beaver T on: 04/06/2016 01:45 PM   Modules accepted: Orders

## 2016-04-19 ENCOUNTER — Other Ambulatory Visit (INDEPENDENT_AMBULATORY_CARE_PROVIDER_SITE_OTHER): Payer: 59

## 2016-04-19 DIAGNOSIS — E1165 Type 2 diabetes mellitus with hyperglycemia: Secondary | ICD-10-CM

## 2016-04-19 LAB — LIPID PANEL
Cholesterol: 167 mg/dL (ref 0–200)
HDL: 44.5 mg/dL (ref >39.00)
LDL Cholesterol: 102 mg/dL — ABNORMAL HIGH (ref 0–99)
NonHDL: 122.14
Total CHOL/HDL Ratio: 4
Triglycerides: 101 mg/dL (ref 0.0–149.0)
VLDL: 20.2 mg/dL (ref 0.0–40.0)

## 2016-05-07 ENCOUNTER — Telehealth: Payer: Self-pay | Admitting: Internal Medicine

## 2016-05-07 ENCOUNTER — Other Ambulatory Visit: Payer: Self-pay

## 2016-05-07 MED ORDER — GLUCOSE BLOOD VI STRP: ORAL_STRIP | 5 refills | Status: DC

## 2016-05-07 NOTE — Telephone Encounter (Signed)
Pt needs some Accu Check test strips testing 2x daily sent into the [facility].

## 2016-05-07 NOTE — Telephone Encounter (Signed)
RX submitted.

## 2016-05-10 ENCOUNTER — Telehealth: Payer: Self-pay | Admitting: Family Medicine

## 2016-05-10 DIAGNOSIS — R911 Solitary pulmonary nodule: Secondary | ICD-10-CM

## 2016-05-10 NOTE — Telephone Encounter (Signed)
I called patient to follow up on CT scan she had done Feb 2017 warranting 1 yr repeat.  Risk factors for lung cancer in patient includes hx of tobacco smoking. She quit about 8 yrs ago. Occasional second hand smoke exposure. She denies Fx of lung cancer.  CT chest ordered for pulmonary nodule follow up. She is otherwise doing well.    IMPRESSION: 4 mm right middle lobe pulmonary nodule.If the patient is at high risk for bronchogenic carcinoma, follow-up chest CT at 1 year is recommended. If the patient is at low risk, no follow-up is needed. This recommendation follows the consensus statement: Guidelines for Management of Small Pulmonary Nodules Detected on CT Scans: A Statement from the [employer] as published in Radiology 2005; [id number]

## 2016-05-14 ENCOUNTER — Telehealth: Payer: Self-pay | Admitting: Family Medicine

## 2016-05-14 ENCOUNTER — Other Ambulatory Visit: Payer: Self-pay | Admitting: Family Medicine

## 2016-05-14 DIAGNOSIS — E118 Type 2 diabetes mellitus with unspecified complications: Secondary | ICD-10-CM

## 2016-05-14 NOTE — Telephone Encounter (Signed)
LMOVM for patient to return call. Chest CT has been scheduled for 05/23/2016 at 3:00PM at [facility]. Arrival 2:45PM. Patient will need labs done prior to this appt. Dr. Gwendlyn Deutscher has been advised of this.

## 2016-05-14 NOTE — Telephone Encounter (Signed)
Spoke with patient and she is aware of need for labs ([facility]) and also her appt date and time.  Patient will just her labs drawn at the hospital this week.  Jazmin Hartsell,CMA

## 2016-05-14 NOTE — Telephone Encounter (Signed)
[facility] Blue team,  BMET ordered for patient. Please schedule lab appointment prior to CT scan appointment. Thanks.

## 2016-05-15 ENCOUNTER — Other Ambulatory Visit (HOSPITAL_COMMUNITY)
Admission: RE | Admit: 2016-05-15 | Discharge: 2016-05-15 | Disposition: A | Discharge status: Home / Self Care | Payer: 59 | Source: Ambulatory Visit | Attending: Family Medicine | Admitting: Family Medicine

## 2016-05-15 DIAGNOSIS — E118 Type 2 diabetes mellitus with unspecified complications: Secondary | ICD-10-CM | POA: Diagnosis not present

## 2016-05-15 LAB — COMPREHENSIVE METABOLIC PANEL
ALT: 16 U/L (ref 14–54)
AST: 20 U/L (ref 15–41)
Albumin: 3.8 g/dL (ref 3.5–5.0)
Alkaline Phosphatase: 79 U/L (ref 38–126)
Anion gap: 8 (ref 5–15)
BUN: 10 mg/dL (ref 6–20)
CO2: 25 mmol/L (ref 22–32)
Calcium: 9.3 mg/dL (ref 8.9–10.3)
Chloride: 106 mmol/L (ref 101–111)
Creatinine, Ser: 0.97 mg/dL (ref 0.44–1.00)
GFR calc Af Amer: >60 mL/min (ref >60)
GFR calc non Af Amer: >60 mL/min (ref >60)
Glucose, Bld: 99 mg/dL (ref 65–99)
Potassium: 3.7 mmol/L (ref 3.5–5.1)
Sodium: 139 mmol/L (ref 135–145)
Total Bilirubin: <0.1 mg/dL — ABNORMAL LOW (ref 0.3–1.2)
Total Protein: 7.3 g/dL (ref 6.5–8.1)

## 2016-05-23 ENCOUNTER — Ambulatory Visit (HOSPITAL_COMMUNITY)
Admission: RE | Admit: 2016-05-23 | Discharge: 2016-05-23 | Disposition: A | Discharge status: Home / Self Care | Payer: 59 | Source: Ambulatory Visit | Attending: Family Medicine | Admitting: Family Medicine

## 2016-05-23 DIAGNOSIS — H5203 Hypermetropia, bilateral: Secondary | ICD-10-CM | POA: Diagnosis not present

## 2016-05-23 DIAGNOSIS — Z87891 Personal history of nicotine dependence: Secondary | ICD-10-CM | POA: Diagnosis not present

## 2016-05-23 DIAGNOSIS — R0602 Shortness of breath: Secondary | ICD-10-CM | POA: Diagnosis not present

## 2016-05-23 DIAGNOSIS — R911 Solitary pulmonary nodule: Secondary | ICD-10-CM | POA: Diagnosis not present

## 2016-05-24 ENCOUNTER — Telehealth: Payer: Self-pay | Admitting: Family Medicine

## 2016-05-24 NOTE — Telephone Encounter (Signed)
CT chest result discussed with patient. All questions were answered.  She mentioned she is having pain beneath her shoulder blade which worsens with deep breath. This could be pleurisy vs MSK pain. Unfortunately I am unable to give good recommendation without seeing her. In the mean time I recommended Ibuprofen prn and clinic or ED visit if symptoms worsens. She verbalized understanding and agreed with plan.

## 2016-05-25 LAB — HM DIABETES EYE EXAM

## 2016-06-04 MED FILL — PANTOPRAZOLE SOD DR 40 MG T: 40 | 90 days supply | Qty: 90 | Fill #1

## 2016-06-04 MED FILL — LIVALO 2 MG TABLET: 2 | 30 days supply | Qty: 30 | Fill #1

## 2016-06-08 ENCOUNTER — Ambulatory Visit (INDEPENDENT_AMBULATORY_CARE_PROVIDER_SITE_OTHER): Payer: 59 | Admitting: Internal Medicine

## 2016-06-08 ENCOUNTER — Encounter: Payer: Self-pay | Admitting: Internal Medicine

## 2016-06-08 VITALS — BP 114/78 | HR 70 | Temp 98.0°F | Ht 65.0 in | Wt 210.8 lb

## 2016-06-08 DIAGNOSIS — R5383 Other fatigue: Secondary | ICD-10-CM | POA: Diagnosis not present

## 2016-06-08 DIAGNOSIS — G4719 Other hypersomnia: Secondary | ICD-10-CM | POA: Diagnosis not present

## 2016-06-08 DIAGNOSIS — R1013 Epigastric pain: Secondary | ICD-10-CM | POA: Insufficient documentation

## 2016-06-08 NOTE — Assessment & Plan Note (Signed)
Possibly related to GERD vs hiatal hernia. Is taking Protonix BID as prescribed by GI. No red flags, including weight loss, decreased appetite, melena, hematochezia, NSAID use, burning pain. Mild TTP on exam.  - Recommend GI f/u. As patient seen within the past year does not need new referral.

## 2016-06-08 NOTE — Progress Notes (Signed)
   Subjective:   Patient: [name]       Birthdate: 01/22/1961       MRN: [id number]      HPI  ARRYN TERRONES is a 56 y.o. female presenting for abdominal pain and fatigue.   Mid epigastric pain Not a new problem. Has been increasing in frequency, which is why patient scheduled appt. Pain is located in mid epigastric region. Patient denies any true abdominal pain. Describes pain as sharp. Occurs 2-3 times per week. Cannot identify any precipitating factors. Sometimes occurs when eating, but is not always associated with eating. Is taking Protonix BID for prior diagnosis of GERD but this does not seem to help. Has been seen by GI in the past (most recently about a year ago) and told she has a hiatal hernia but was told at that time that no intervention was necessary and to return PRN. Denies nausea or vomiting. Denies melema, hematochezia. Denies any type of burning epigastric pain. Denies decreased appetite. Does not take NSAIDs. Pain resolves spontaneously or if she reclines at a certain angle in a reclining chair.   Fatigue Also not a new issue, but patient feels is worsening. Reports falling asleep at work and even at a stoplight recently. Receives 7 hours of sleep at night, but says sleep is restless and she does not feel refreshed when she wakes up in the morning. Started taking Vitamin B12 as recommended by PCP Dr. Gwendlyn Deutscher but says this has not made much of a difference. Does get restful sleep if she takes a medication that makes her drowsy but she does not like taking medication so does not want any sleep meds. Patient was referred for sleep study by Dr. Gwendlyn Deutscher at last visit but has not had this performed yet.   Smoking status reviewed. Patient is former smoker.   Review of Systems See HPI.     Objective:  Physical Exam  Constitutional: She is oriented to person, place, and time and well-developed, well-nourished, and in no distress.  HENT:  Head: Normocephalic and atraumatic.    Eyes: Conjunctivae and EOM are normal. Right eye exhibits no discharge. Left eye exhibits no discharge.  Pulmonary/Chest: Effort normal. No respiratory distress.  Abdominal:  Tenderness to deep palpation in mid epigastric region. Abd soft, non-distended, +BS.   Neurological: She is alert and oriented to person, place, and time.  Skin: Skin is warm and dry.  Psychiatric: Affect and judgment normal.      Assessment & Plan:  Fatigue Likely related to poor sleep, however will obtain TSH and CBC today to rule out other causes. No obvious signs of bleeding and patient with normal Hgb in 01/2016, but will obtain today to be sure. Encouraged patient to follow up with PCP Dr. Gwendlyn Deutscher if problem worsens to continue and to follow through with sleep study as recommended by Dr. Gwendlyn Deutscher.   Midepigastric pain Possibly related to GERD vs hiatal hernia. Is taking Protonix BID as prescribed by GI. No red flags, including weight loss, decreased appetite, melena, hematochezia, NSAID use, burning pain. Mild TTP on exam.  - Recommend GI f/u. As patient seen within the past year does not need new referral.    Adin Hector, MD, MPH PGY-2 [facility] Pager [phone]

## 2016-06-08 NOTE — Patient Instructions (Addendum)
It was nice meeting you today [name]!  Please call your GI doctor to try to get an appointment at your earliest convenience. In the meantime, continue to take Protonix twice a day as you have been.   If there are any abnormalities with your bloodwork, we will let you know.   Please schedule a follow-up appointment with Dr. Gwendlyn Deutscher to continue to investigate your tiredness.   If you have any questions or concerns, please feel free to call the clinic.   Be well,  Dr. Avon Gully

## 2016-06-08 NOTE — Assessment & Plan Note (Signed)
Likely related to poor sleep, however will obtain TSH and CBC today to rule out other causes. No obvious signs of bleeding and patient with normal Hgb in 01/2016, but will obtain today to be sure. Encouraged patient to follow up with PCP Dr. Gwendlyn Deutscher if problem worsens to continue and to follow through with sleep study as recommended by Dr. Gwendlyn Deutscher.

## 2016-06-09 LAB — CBC
Hematocrit: 36.9 % (ref 34.0–46.6)
Hemoglobin: 12.3 g/dL (ref 11.1–15.9)
MCH: 28 pg (ref 26.6–33.0)
MCHC: 33.3 g/dL (ref 31.5–35.7)
MCV: 84 fL (ref 79–97)
Platelets: 354 10*3/uL (ref 150–379)
RBC: 4.4 x10E6/uL (ref 3.77–5.28)
RDW: 14.3 % (ref 12.3–15.4)
WBC: 6.8 10*3/uL (ref 3.4–10.8)

## 2016-06-09 LAB — TSH: TSH: 0.43 u[IU]/mL — ABNORMAL LOW (ref 0.450–4.500)

## 2016-06-29 ENCOUNTER — Ambulatory Visit: Payer: Self-pay | Admitting: Physician Assistant

## 2016-07-06 ENCOUNTER — Other Ambulatory Visit: Payer: Self-pay | Admitting: Internal Medicine

## 2016-07-06 MED FILL — ACCU-CHEK FASTCLIX LANCETS: 90 days supply | Qty: 102 | Fill #0

## 2016-07-06 MED FILL — ACCU-CHEK GUIDE TEST STRIP: 90 days supply | Qty: 100 | Fill #0

## 2016-07-06 MED FILL — LIVALO 2 MG TABLET: 2 | 30 days supply | Qty: 30 | Fill #2

## 2016-07-06 MED FILL — ESTRADIOL 1 MG TABLET: 1 | 90 days supply | Qty: 180 | Fill #1

## 2016-07-06 MED FILL — METFORMIN HCL ER 500 MG TAB: 500 | 90 days supply | Qty: 180 | Fill #0

## 2016-07-20 ENCOUNTER — Ambulatory Visit (INDEPENDENT_AMBULATORY_CARE_PROVIDER_SITE_OTHER): Payer: 59 | Admitting: Family Medicine

## 2016-07-20 ENCOUNTER — Encounter: Payer: Self-pay | Admitting: Family Medicine

## 2016-07-20 VITALS — BP 118/84 | HR 81 | Temp 99.0°F | Wt 210.0 lb

## 2016-07-20 DIAGNOSIS — B9789 Other viral agents as the cause of diseases classified elsewhere: Secondary | ICD-10-CM

## 2016-07-20 DIAGNOSIS — J069 Acute upper respiratory infection, unspecified: Secondary | ICD-10-CM | POA: Diagnosis not present

## 2016-07-20 NOTE — Progress Notes (Signed)
   Subjective:   [name] is a 56 y.o. female with a history of VIN I, T2 DM, migraine headaches, HLD here for same day appt for  Chief Complaint  Patient presents with  . Cough  . Headache  . Generalized Body Aches     Patient reports nonproductive cough, sore throat, myalgias, headache, nasal congestion, fever, chills for 3 days. Tmax 101.4. She denies nausea, vomiting, shortness of breath, chest pain, decreased by mouth intake. Her nephew is sick with similar symptoms over the weekend. She tried taking Zyrtec and Excedrin Migraine which helped minimally.  Review of Systems:  Per HPI.   Social History: Former smoker  Objective:  BP 118/84   Pulse 81   Temp 99 F (37.2 C) (Oral)   Wt 210 lb (95.3 kg)   BMI 34.95 kg/m   Gen:  56 y.o. female in NAD HEENT: NCAT, MMM, EOMI, PERRL, anicteric sclerae, OP clear, TMs clear bilaterally CV: RRR, no MRG Resp: Non-labored, CTAB, no wheezes noted Ext: WWP, no edema MSK: No obvious deformities, gait intact Neuro: Alert and oriented, speech normal     Assessment & Plan:     [name] is a 56 y.o. female here for   1. Viral URI with cough --history consistent with viral URI -No evidence of bacterial infection such as pneumonia, otitis media, strep pharyngitis on exam -Discussed symptomatic management, natural course, return precautions   Virginia Crews, MD MPH PGY-3,  [facility] 07/20/2016  11:19 AM

## 2016-07-20 NOTE — Patient Instructions (Signed)
Upper Respiratory Infection, Adult Most upper respiratory infections (URIs) are a viral infection of the air passages leading to the lungs. A URI affects the nose, throat, and upper air passages. The most common type of URI is nasopharyngitis and is typically referred to as "the common cold." URIs run their course and usually go away on their own. Most of the time, a URI does not require medical attention, but sometimes a bacterial infection in the upper airways can follow a viral infection. This is called a secondary infection. Sinus and middle ear infections are common types of secondary upper respiratory infections. Bacterial pneumonia can also complicate a URI. A URI can worsen asthma and chronic obstructive pulmonary disease (COPD). Sometimes, these complications can require emergency medical care and may be life threatening. What are the causes? Almost all URIs are caused by viruses. A virus is a type of germ and can spread from one person to another. What increases the risk? You may be at risk for a URI if:  You smoke.  You have chronic heart or lung disease.  You have a weakened defense (immune) system.  You are very young or very old.  You have nasal allergies or asthma.  You work in crowded or poorly ventilated areas.  You work in health care facilities or schools.  What are the signs or symptoms? Symptoms typically develop 2-3 days after you come in contact with a cold virus. Most viral URIs last 7-10 days. However, viral URIs from the influenza virus (flu virus) can last 14-18 days and are typically more severe. Symptoms may include:  Runny or stuffy (congested) nose.  Sneezing.  Cough.  Sore throat.  Headache.  Fatigue.  Fever.  Loss of appetite.  Pain in your forehead, behind your eyes, and over your cheekbones (sinus pain).  Muscle aches.  How is this diagnosed? Your health care provider may diagnose a URI by:  Physical exam.  Tests to check that your  symptoms are not due to another condition such as: ? Strep throat. ? Sinusitis. ? Pneumonia. ? Asthma.  How is this treated? A URI goes away on its own with time. It cannot be cured with medicines, but medicines may be prescribed or recommended to relieve symptoms. Medicines may help:  Reduce your fever.  Reduce your cough.  Relieve nasal congestion.  Follow these instructions at home:  Take medicines only as directed by your health care provider.  Gargle warm saltwater or take cough drops to comfort your throat as directed by your health care provider.  Use a warm mist humidifier or inhale steam from a shower to increase air moisture. This may make it easier to breathe.  Drink enough fluid to keep your urine clear or pale yellow.  Eat soups and other clear broths and maintain good nutrition.  Rest as needed.  Return to work when your temperature has returned to normal or as your health care provider advises. You may need to stay home longer to avoid infecting others. You can also use a face mask and careful hand washing to prevent spread of the virus.  Increase the usage of your inhaler if you have asthma.  Do not use any tobacco products, including cigarettes, chewing tobacco, or electronic cigarettes. If you need help quitting, ask your health care provider. How is this prevented? The best way to protect yourself from getting a cold is to practice good hygiene.  Avoid oral or hand contact with people with cold symptoms.  Wash your   hands often if contact occurs.  There is no clear evidence that vitamin C, vitamin E, echinacea, or exercise reduces the chance of developing a cold. However, it is always recommended to get plenty of rest, exercise, and practice good nutrition. Contact a health care provider if:  You are getting worse rather than better.  Your symptoms are not controlled by medicine.  You have chills.  You have worsening shortness of breath.  You have  brown or red mucus.  You have yellow or brown nasal discharge.  You have pain in your face, especially when you bend forward.  You have a fever.  You have swollen neck glands.  You have pain while swallowing.  You have white areas in the back of your throat. Get help right away if:  You have severe or persistent: ? Headache. ? Ear pain. ? Sinus pain. ? Chest pain.  You have chronic lung disease and any of the following: ? Wheezing. ? Prolonged cough. ? Coughing up blood. ? A change in your usual mucus.  You have a stiff neck.  You have changes in your: ? Vision. ? Hearing. ? Thinking. ? Mood. This information is not intended to replace advice given to you by your health care provider. Make sure you discuss any questions you have with your health care provider. Document Released: 08/29/2000 Document Revised: 11/06/2015 Document Reviewed: 06/10/2013 Elsevier Interactive Patient Education  2017 [employer] Inc.  

## 2016-07-26 ENCOUNTER — Telehealth: Payer: Self-pay | Admitting: Family Medicine

## 2016-07-26 NOTE — Telephone Encounter (Signed)
Pt is no better.  She is coughing a lot and is still congested, mostly in her head.  She would like something for the coughing and antibotic.  [facility] outpt ph[facility].  Please call pt when RX are called in

## 2016-07-26 NOTE — Telephone Encounter (Signed)
Attempted to call pt and inform her of pcp and baci plan. Pt did not answer, a vm was left advising her to call the office. When pt calls back please schedule a fu apt with bacigalupo or pcp.

## 2016-07-26 NOTE — Telephone Encounter (Signed)
Pt was advised. Appointment scheduled for 07-27-16 ep

## 2016-07-26 NOTE — Telephone Encounter (Signed)
Pt saw Bacigalupo on 5/4. Please advise.

## 2016-07-26 NOTE — Telephone Encounter (Signed)
She was seen for viral URI on 5/4.  If symptoms have worsened or failed to improve or she thinks she needs an antibiotic, she will need a follow-up appt.  Virginia Crews, MD, MPH PGY-3,  [facility] 07/26/2016 11:14 AM

## 2016-07-26 NOTE — Telephone Encounter (Signed)
Please have her follow up soon for reassessment.

## 2016-07-27 ENCOUNTER — Ambulatory Visit: Payer: Self-pay | Admitting: Family Medicine

## 2016-07-29 NOTE — Progress Notes (Signed)
    Subjective:  [name] is a 56 y.o. female who presents to the [facility] today with nasal congestion.  HPI:  Seen in [facility] on 07/20/16 for viral URI, was advised to continue with symptomatic management. Patient states she has continued cough that is now productive with yellow sputum, nasal congestion, chills, headaches, sore throat that has now persisted for the past 2 weeks. No n/v/d, no fevers, no vision changes, no lightheadedness/dizziness.    ROS: Per HPI  Objective:  Physical Exam: BP 110/80   Pulse 84   Temp 98.4 F (36.9 C) (Oral)   Wt 207 lb (93.9 kg)   BMI 34.45 kg/m   Gen: NAD, resting comfortably HEENT: [place], AT. PERRL. No maxillary tenderness. L TM dull and bulging membrane with small white round plastic object sitting on TM. R TM is pearly with good light reflex. Oropharynx nonerythematous. CV: RRR with no murmurs appreciated Pulm: NWOB, CTAB with no crackles, wheezes, or rhonchi GI: Normal bowel sounds present. Soft, Nontender, Nondistended. MSK: no edema, cyanosis, or clubbing noted Skin: warm, dry Neuro: grossly normal, moves all extremities Psych: Normal affect and thought content   Assessment/Plan:  L otitis media with foreign body on TM - amoxicillin for 10 days given the length of illness and physical exam findings - foreign body visualized, per patient likely from ear plugs she uses, see procedure note below  Ear irrigation for foreign body removal Informed consent signed and in chart. Performed foreign body removal by irrigation w/ H2O and curette. No trauma or complications noted. TM clear bilaterally and w/out perforation. Pt reports hearing intact.  Informed to return to clinic if has new or worsening symptoms such as persistent fevers, persistent vomiting, decreased PO. Expressed understanding of and agreement with plan and all questions answered.            Bufford Lope, DO PGY-1, [facility] 07/29/2016 9:59 PM

## 2016-07-30 ENCOUNTER — Encounter: Payer: Self-pay | Admitting: Family Medicine

## 2016-07-30 ENCOUNTER — Ambulatory Visit (INDEPENDENT_AMBULATORY_CARE_PROVIDER_SITE_OTHER): Payer: 59 | Admitting: Family Medicine

## 2016-07-30 VITALS — BP 110/80 | HR 84 | Temp 98.4°F | Wt 207.0 lb

## 2016-07-30 DIAGNOSIS — T162XXA Foreign body in left ear, initial encounter: Secondary | ICD-10-CM

## 2016-07-30 DIAGNOSIS — H6592 Unspecified nonsuppurative otitis media, left ear: Secondary | ICD-10-CM

## 2016-07-30 MED ORDER — AMOXICILLIN 500 MG PO CAPS: 500.0000 mg | ORAL_CAPSULE | Freq: Two times a day (BID) | ORAL | 0 refills | Status: AC

## 2016-07-30 MED FILL — AMOXICILLIN 500 MG CAPSULE: 500 | 10 days supply | Qty: 20 | Fill #0

## 2016-07-30 NOTE — Patient Instructions (Signed)
It was great to see you again!  For your ear infection,  - Please start antibiotic amoxicillin, twice a day for 10 days.  Take care and seek immediate care sooner if you develop any concerns.   Dr. Bufford Lope, [place]

## 2016-08-01 ENCOUNTER — Ambulatory Visit (INDEPENDENT_AMBULATORY_CARE_PROVIDER_SITE_OTHER): Payer: 59 | Admitting: Internal Medicine

## 2016-08-01 ENCOUNTER — Encounter: Payer: Self-pay | Admitting: Internal Medicine

## 2016-08-01 VITALS — BP 110/70 | HR 84 | Ht 65.0 in | Wt 209.6 lb

## 2016-08-01 DIAGNOSIS — K219 Gastro-esophageal reflux disease without esophagitis: Secondary | ICD-10-CM

## 2016-08-01 DIAGNOSIS — R1310 Dysphagia, unspecified: Secondary | ICD-10-CM

## 2016-08-01 DIAGNOSIS — K222 Esophageal obstruction: Secondary | ICD-10-CM

## 2016-08-01 DIAGNOSIS — R1319 Other dysphagia: Secondary | ICD-10-CM

## 2016-08-01 MED ORDER — PANTOPRAZOLE SODIUM 40 MG PO TBEC: 40.0000 mg | DELAYED_RELEASE_TABLET | Freq: Two times a day (BID) | ORAL | 3 refills | Status: DC

## 2016-08-01 NOTE — Progress Notes (Signed)
   [name] 56 y.o. Dec 24, 1960 [id number]  Assessment & Plan:   Encounter Diagnoses  Name Primary?  . GERD with stricture Yes  . Esophageal dysphagia    Bid PPI instead of qd Has DM but do not think has gastroparesis but keep in mind  EGD w/ dilation The risks and benefits as well as alternatives of endoscopic procedure(s) have been discussed and reviewed. All questions answered. The patient agrees to proceed.   Also encouraged exercise like yoga for back pain  JW:LKHVFM, Phill Myron, MD   Subjective:   Chief Complaint: dysphagia  HPI Here for f/u had good results w/ esophageal dilatopn 1 year ago but in past few mos recurent solid dysphagia.Sometimes taking pantoprazole bid No early satiety or regurgitation or vomiting'Food sitting in chest when she eats  Wt Readings from Last 3 Encounters:  08/01/16 209 lb 9.6 oz (95.1 kg)  07/30/16 207 lb (93.9 kg)  07/20/16 210 lb (95.3 kg)   Medications, allergies, past medical history, past surgical history, family history and social history are reviewed and updated in the EMR.  Review of Systems Back pain Try yoga  Objective:   Physical Exam BP 110/70   Pulse 84   Ht 5\' 5"  (1.651 m)   Wt 209 lb 9.6 oz (95.1 kg)   BMI 34.88 kg/m  NAD Lungs cta Cor s1s2 no rmg abd soft and nontender but is tender in xiphoid area Appropriate mood and affect

## 2016-08-01 NOTE — Patient Instructions (Addendum)
We have sent the following medications to your pharmacy for you to pick up at your convenience: 1. Pantoprazole Sodium 40 mg take 1 tablet twice daily.  You have been scheduled for an endoscopy. Please follow written instructions given to you at your visit today. If you use inhalers (even only as needed), please bring them with you on the day of your procedure. Your physician has requested that you go to www.startemmi.com and enter the access code given to you at your visit today. This web site gives a general overview about your procedure. However, you should still follow specific instructions given to you by our office regarding your preparation for the procedure.   I appreciate the opportunity to care for you. Silvano Rusk, MD, [facility]

## 2016-08-03 ENCOUNTER — Ambulatory Visit (INDEPENDENT_AMBULATORY_CARE_PROVIDER_SITE_OTHER): Payer: 59 | Admitting: Internal Medicine

## 2016-08-03 ENCOUNTER — Encounter: Payer: Self-pay | Admitting: Internal Medicine

## 2016-08-03 VITALS — BP 122/80 | HR 86 | Ht 64.5 in | Wt 211.0 lb

## 2016-08-03 DIAGNOSIS — E785 Hyperlipidemia, unspecified: Secondary | ICD-10-CM | POA: Diagnosis not present

## 2016-08-03 DIAGNOSIS — R946 Abnormal results of thyroid function studies: Secondary | ICD-10-CM | POA: Diagnosis not present

## 2016-08-03 DIAGNOSIS — E1165 Type 2 diabetes mellitus with hyperglycemia: Secondary | ICD-10-CM | POA: Diagnosis not present

## 2016-08-03 DIAGNOSIS — R5383 Other fatigue: Secondary | ICD-10-CM

## 2016-08-03 DIAGNOSIS — R7989 Other specified abnormal findings of blood chemistry: Secondary | ICD-10-CM

## 2016-08-03 LAB — TSH: TSH: 0.34 u[IU]/mL — ABNORMAL LOW (ref 0.35–4.50)

## 2016-08-03 LAB — POCT GLYCOSYLATED HEMOGLOBIN (HGB A1C): Hemoglobin A1C: 6.2

## 2016-08-03 LAB — VITAMIN B12: Vitamin B-12: 315 pg/mL (ref 211–911)

## 2016-08-03 LAB — T3, FREE: T3, Free: 3.6 pg/mL (ref 2.3–4.2)

## 2016-08-03 LAB — VITAMIN D 25 HYDROXY (VIT D DEFICIENCY, FRACTURES): VITD: 17.98 ng/mL — ABNORMAL LOW (ref 30.00–100.00)

## 2016-08-03 LAB — T4, FREE: Free T4: 0.79 ng/dL (ref 0.60–1.60)

## 2016-08-03 MED ORDER — PITAVASTATIN CALCIUM 2 MG PO TABS: 2.0000 mg | ORAL_TABLET | Freq: Every day | ORAL | 3 refills | Status: DC

## 2016-08-03 MED FILL — LIVALO 2 MG TABLET: 2 | 90 days supply | Qty: 90 | Fill #0

## 2016-08-03 NOTE — Progress Notes (Signed)
Patient ID: [name], female   DOB: 12/11/1960, 56 y.o.   MRN: [id number]  HPI: [name] is a 56 y.o.-year-old female, returning for f/u for DM2; prediabetes dx early 2000s, controlled, without complications and also hyperlipidemia. Last visit 4 mo ago.  She has a URI >> on Amoxycilin.  Her food gets stuck in her chest >> She will need her esophagus stretched again.  Last hemoglobin A1c was: Lab Results  Component Value Date   HGBA1C 5.9 04/06/2016   HGBA1C 6.4 12/13/2015   HGBA1C 6.3 08/12/2015  She had several steroid inj in last year- last in 04/2014.   She is on: - Metformin ER 500 mg 2x a day   She was previously on: - Tradjenta 5 mg in am, before b'fast  - Invokana 100 mg in am - started 03/2015 >> she had CP, HA, lightheadedness - regular metformin >> diarrhea/nausea with regular metformin.  Pt checks sugars 0-1x a day: - am:70-90 >> 165-180 >> 85-105 >> 74-95 >> 85-95 >> 95-105 >> 80-90s >> 95-98 - 2h after b'fast: n/c >> 78-95 n/c >> 120-145 >> n/c >> 102 - lunch: 95-101 >> n/c >> 135-155 >> 77-95 >> 66, 85-95 >> 95-100 >> 95-100 >> 90s - 2h after lunch: 190 >> 135-155 >> 87-95, 110  >> n/c >> 120-145 >> 95-100 >> n/c - before dinner (snacking): 170s >> 80-100 >> n/c - 2h after dinner:  220-230 >> 90-114 >> 90s-100 >> 120-145 >> n/c >> 96-100s, 135 x1 - bedtime: 85-98 >> 85-100 No lows. Lowest sugar was 80s >> 90s; ? she has hypoglycemia awareness.  Highest: 145 >> 135.  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  - No history of CKD, last BUN/creatinine:  Lab Results  Component Value Date   BUN 10 05/15/2016   CREATININE 0.97 05/15/2016   - last eye exam was in 05/25/2016. No DR. - She denies numbness  and tingling in her feet  HL: Reviewed last 3 sets of lipids. She is doing great on Livalo.   Lab Results  Component Value Date   CHOL 167 04/19/2016   CHOL 179 04/26/2015   CHOL 266 (H) 02/03/2015   Lab Results  Component Value Date   HDL 44.50 04/19/2016   HDL 42.60 04/26/2015   HDL 44.40 02/03/2015   Lab Results  Component Value Date   LDLCALC 102 (H) 04/19/2016   LDLCALC 123 (H) 04/26/2015   LDLCALC 196 (H) 02/03/2015   Lab Results  Component Value Date   TRIG 101.0 04/19/2016   TRIG 66.0 04/26/2015   TRIG 128.0 02/03/2015   Lab Results  Component Value Date   CHOLHDL 4 04/19/2016   CHOLHDL 4 04/26/2015   CHOLHDL 6 02/03/2015   Normal LFTs: Lab Results  Component Value Date   ALT 16 05/15/2016   AST 20 05/15/2016   ALKPHOS 79 05/15/2016   BILITOT <0.1 (L) 05/15/2016   She c/o fatigue.  ROS: Constitutional: + weight gain, + fatigue, + subjective hyperthermia Eyes: no blurry vision, no xerophthalmia ENT: + sore throat, no nodules palpated in throat, no dysphagia, no odynophagia, no hoarseness Cardiovascular: no CP/no SOB/no palpitations/no leg swelling Respiratory: no cough/no SOB/no wheezing Gastrointestinal: + N/no V/no D/no C/+  acid reflux Musculoskeletal: no muscle aches/no joint aches Skin: no rashes, no hair loss Neurological: no tremors/no numbness/no tingling/no dizziness  I reviewed pt's medications, allergies, PMH, social hx, family hx, and changes were documented in the history of present illness. Otherwise, unchanged from my initial visit note.  PE: BP 122/80 (BP Location: Left Arm, Patient Position: Sitting)   Pulse 86   Ht 5' 4.5" (1.638 m)   Wt 211 lb (95.7 kg)   SpO2 96%   BMI 35.66 kg/m  Body mass index is 35.66 kg/m. Wt Readings from Last 3 Encounters:  08/03/16 211 lb (95.7 kg)  08/01/16 209 lb 9.6 oz (95.1 kg)  07/30/16 207 lb (93.9 kg)   Constitutional: overweight, in NAD Eyes: PERRLA, EOMI, no exophthalmos ENT: moist  mucous membranes, no thyromegaly, no cervical lymphadenopathy Cardiovascular: RRR, No MRG Respiratory: CTA B Gastrointestinal: abdomen soft, NT, ND, BS+ Musculoskeletal: no deformities, strength intact in all 4 Skin: moist, warm, no rashes Neurological: no tremor with outstretched hands, DTR normal in all 4  ASSESSMENT: 1. DM2, non-insulin dependent, without complications, controlled  2. HL  3. Low TSH  4. Fatigue  PLAN:  1. Patient with DM2, on metformin ER, with good control. No reason to change her regimen for now. - I suggested to: Patient Instructions  Please continue:  - Metformin ER 500 mg 2x a day - Livalo 2 mg daily  Please stop at the lab.  Please return in 4 months with your sugar log  - today, HbA1c is 6.2% (slightly higher) - continue checking sugars at different times of the day - check 1x a day, rotating checks - advised for yearly eye exams >> she is UTD - Return to clinic in 4 mo with sugar log   2. HL - Pt with high LDL >> much better now on Livalo (per review of last 3 sets of lipids) - refilled Livalo  3. Low TSH - had a recent low TSH - will check TFTs today - no Biotin  4. Fatigue - will check vitamin D and B12  Component     Latest Ref Rng & Units 08/03/2016  TSH     0.35 - 4.50 uIU/mL 0.34 (L)  Hemoglobin A1C      6.2  Vitamin B12     211 - 911 pg/mL 315  T4,Free(Direct)     0.60 - 1.60 ng/dL 0.79  Triiodothyronine,Free,Serum     2.3 - 4.2 pg/mL 3.6  VITD     30.00 - 100.00 ng/mL 17.98 (L)   Vitamin D is very low, so I would suggest to start 4000 units daily.  Vitamin B12 is also low, so I would suggest to start 5000 units daily. She has subclinical thyrotoxicosis, but the TSH is still mildly low, that we can just follow it for now. I will repeat her tests when she comes back.  Philemon Kingdom, MD PhD [facility]

## 2016-08-03 NOTE — Patient Instructions (Addendum)
Please continue:  - Metformin ER 500 mg 2x a day - Livalo 2 mg daily  Please stop at the lab.  Please return in 4 months with your sugar log

## 2016-08-08 ENCOUNTER — Encounter: Payer: Self-pay | Admitting: Family Medicine

## 2016-08-08 ENCOUNTER — Ambulatory Visit (INDEPENDENT_AMBULATORY_CARE_PROVIDER_SITE_OTHER): Payer: 59 | Admitting: Family Medicine

## 2016-08-08 DIAGNOSIS — A084 Viral intestinal infection, unspecified: Secondary | ICD-10-CM

## 2016-08-08 DIAGNOSIS — E1165 Type 2 diabetes mellitus with hyperglycemia: Secondary | ICD-10-CM

## 2016-08-08 MED ORDER — ONDANSETRON 4 MG PO TBDP: 4.0000 mg | ORAL_TABLET | Freq: Three times a day (TID) | ORAL | 0 refills | Status: DC | PRN

## 2016-08-08 MED FILL — ONDANSETRON ODT 4 MG TABLET: 4 | 4 days supply | Qty: 12 | Fill #0

## 2016-08-08 NOTE — Patient Instructions (Signed)
Please pick up the zofran tonight.  I want you to get the nausea medicine in your system and then get some fluids in you.   The nausea and vomiting should clear quickly.  Sometimes it takes several days for the diarrhea to go away. No metformin until you are eating and drinking regularly. If you are still sick on Monday, see [place] to be checked for the antibiotic related diarrhea. Sorry about your brother-in-law.

## 2016-08-09 ENCOUNTER — Ambulatory Visit: Payer: Self-pay | Admitting: Family Medicine

## 2016-08-09 NOTE — Assessment & Plan Note (Signed)
Hold metformin while sick.

## 2016-08-09 NOTE — Assessment & Plan Note (Signed)
Viral GE is far and away most likely.  Will treat with zofran and supportive care.   Watch for bleeding.  Since just coming off antibiotic, small possibility of C diff exists.

## 2016-08-09 NOTE — Progress Notes (Signed)
   Subjective:    Patient ID: [name], female    DOB: 1961/02/12, 56 y.o.   MRN: [id number]  HPI Abrupt onset of nausea, vomiting, diarrhea and low grade fever <24 hours ago.  Still with all sx.  Does have some cramping but otherwise not abd pain.  No blood.  Just finishing course of amox.  Patient exposed to young grandchildren in diapers - none are ill. Diabetic.  Brother in law died unexpectated (sudden cardiac death syndrome.)  Funeral in 3 days.    Review of Systems     Objective:   Physical ExamAbd diffuse queeziness when palpating but no abd pain.           Assessment & Plan:

## 2016-08-15 ENCOUNTER — Telehealth: Payer: Self-pay

## 2016-08-15 NOTE — Telephone Encounter (Signed)
She can decrease the dose to 1 tab with dinner. If still bothering her >> she needs to stop but keep a close eye on her sugars and let me know if they increase.

## 2016-08-15 NOTE — Telephone Encounter (Signed)
Called and advised patient

## 2016-08-15 NOTE — Telephone Encounter (Signed)
Patient came into the office today, explained that every time she takes the metformin it makes her have GI upset. She would like to know if we need to change this or if we can decrease?    Call at [facility], or Cell with information.

## 2016-08-15 NOTE — Telephone Encounter (Signed)
Called patient and advised of Dr.Gherghe's note.

## 2016-08-27 ENCOUNTER — Encounter: Payer: Self-pay | Admitting: Student

## 2016-08-27 ENCOUNTER — Ambulatory Visit (INDEPENDENT_AMBULATORY_CARE_PROVIDER_SITE_OTHER): Payer: 59 | Admitting: Student

## 2016-08-27 VITALS — BP 110/68 | HR 65 | Temp 98.1°F | Ht 65.0 in | Wt 208.4 lb

## 2016-08-27 DIAGNOSIS — M25511 Pain in right shoulder: Secondary | ICD-10-CM

## 2016-08-27 MED ORDER — MELOXICAM 15 MG PO TABS: 15.0000 mg | ORAL_TABLET | Freq: Every day | ORAL | 0 refills | Status: DC

## 2016-08-27 MED ORDER — METHYLPREDNISOLONE ACETATE 40 MG/ML IJ SUSP
40.0000 mg | Freq: Once | INTRAMUSCULAR | Status: AC
  Administered 2016-08-27: 40 mg via INTRAMUSCULAR

## 2016-08-27 NOTE — Patient Instructions (Addendum)
It was great seeing you today! We have addressed the following issues today 1. Right shoulder pain: We injected your right shoulder today. If no improvement in the pain over the next 1-2 weeks, you can call the number we gave you to schedule a follow-up appointment with sports medicine. Please let us know if you have fever or other symptoms concerning to you.   If we did any lab work today, and the results require attention, either me or my nurse will get in touch with you. If everything is normal, you will get a letter in mail and a message via . If you don't hear from [place] in two weeks, please give [place] a call. Otherwise, we look forward to seeing you again at your next visit. If you have any questions or concerns before then, please call the clinic at [phone].  Please bring all your medications to every doctors visit  Sign up for My Chart to have easy access to your labs results, and communication with your Primary care physician.    Please check-out at the front desk before leaving the clinic.    Take Care,   Dr. Cyndia Skeeters

## 2016-08-28 MED FILL — MELOXICAM 15 MG TABLET: 15 | 30 days supply | Qty: 30 | Fill #0

## 2016-08-29 ENCOUNTER — Encounter: Payer: Self-pay | Admitting: Internal Medicine

## 2016-08-29 DIAGNOSIS — M25512 Pain in left shoulder: Secondary | ICD-10-CM | POA: Insufficient documentation

## 2016-08-29 DIAGNOSIS — M25511 Pain in right shoulder: Secondary | ICD-10-CM | POA: Insufficient documentation

## 2016-08-29 HISTORY — DX: Pain in left shoulder: M25.512

## 2016-08-29 NOTE — Progress Notes (Signed)
Subjective:    [name] is a 56 y.o. old female here for right shoulder pain  HPI Right shoulder pain: for two weeks. She is right handed. She works at diabetic nutrition clinic. She denies lifting heavy weight or labor job recently. Pain location is from elbow to her shoulder posterolaterally. Pain is achy and throbbing in nature. No history of trauma or injury. She never had similar pain. Never had procedure in that arm or shoulder. It hurts to move, raise her arm at her shoulder. She denies swelling, skin rash, numbness, tingling, fever, chills, unintentional or weight loss.   Denies smoking or recreational drug use.  PMH/Problem List: has Migraine headache; Fatigue; Nausea with vomiting; Hyperlipidemia; VAIN I (vaginal intraepithelial neoplasia grade I); Depression with anxiety; Urine, incontinence, stress female; Type 2 diabetes mellitus with hyperglycemia, without long-term current use of insulin ([place]); GERD (gastroesophageal reflux disease); Chest pain; Chest pain with moderate risk for cardiac etiology; Sleep apnea; Solitary pulmonary nodule; Hot flashes; Hypersomnia; Lung nodule; Cough; URI (upper respiratory infection); Sinusitis; Foot pain; Midepigastric pain; Viral gastroenteritis; and Right shoulder pain on her problem list.   has a past medical history of Anxiety; Arthritis; Depression; Diabetes mellitus without complication ([place]); GERD (gastroesophageal reflux disease); H/O varicella; History of measles, mumps, or rubella; ovarian cyst; Hyperlipidemia; Migraine headache; Sleep apnea; Sleep deprivation; and VAIN I (vaginal intraepithelial neoplasia grade I) (2013).  FH:  Family History  Problem Relation Age of Onset  . Hypertension Mother   . Hypotension Mother   . Anemia Mother        low iron  . Heart disease Sister   . Sickle cell trait Other   . Alcohol abuse Brother   . Alcohol abuse Brother   . Drug abuse Brother   . Colon cancer Neg Hx     SH Social History    Substance Use Topics  . Smoking status: Former Smoker    Packs/day: 0.50    Types: Cigarettes  . Smokeless tobacco: Never Used  . Alcohol use 0.0 oz/week     Comment: rarely    Review of Systems Review of systems negative except for pertinent positives and negatives in history of present illness above.     Objective:     Vitals:   08/27/16 1632  BP: 110/68  Pulse: 65  Temp: 98.1 F (36.7 C)  TempSrc: Oral  SpO2: 99%  Weight: 208 lb 6.4 oz (94.5 kg)  Height: 5\' 5"  (1.651 m)    Physical Exam GEN: appears well, no apparent distress. CVS: RRR, nl S1&S2, no murmurs, no edema RESP: no IWOB, good air movement bilaterally MSK:  Right shoulder and arm   Inspection reveals no abnormalities, atrophy or asymmetry. She sits with her right arm close to her body.  Palpation is normal with no tenderness over AC joint or bicipital groove or trapezius muscles.  ROM is limited in her shoulder due to pain. Pain with minimal abduction, extension and internal rotation. Minimal pain with external rotation and flexion  Neuro: C5-T1 intact although motor exam is limited due to pain.   CVS: radial and ulnar pulses intact.   SKIN: no apparent skin lesion PSYCH: euthymic mood with congruent affect  Shoulder Injection:  Consent Given by: Patient Site marked: the procedure site was marked  Timeout: prior to procedure the correct patient, procedure, and site was verified  Indications: Pain and diagnostic evaluation Location: Right shoulder  Site: Rt subacromial area Prep: patient was prepped with alcohol swab Needle Size: 18  G Needle Length: 1.5 inches Approach: Posterior Medications: 3 mL lidocaine 1 % and  40 mg methylPREDNISolone acetate 40 MG/ML Aspiration Attempted: No  Patient tolerance: Patient tolerated the procedure well with no immediate complications    Assessment and Plan:  Right shoulder pain Exam suggestive for rotator cuff syndrome. She has severe pain  with abduction, extension and internal rotation of her her right shoulder. No red flags.  -Right shoulder injection as above.  -Discussed about home exercise regimen -Patient to call and schedule follow up in [facility] if no improvement in her pain over the next 1-2 weeks. She was given the phone number to sport medicine clinic.    Return if symptoms worsen or fail to improve.  Mercy Riding, MD 08/29/16 Pager: [phone]   Precepted patient with Dr. Nori Riis, who was also available for shoulder injection.

## 2016-08-29 NOTE — Assessment & Plan Note (Signed)
Exam suggestive for rotator cuff syndrome. She has severe pain with abduction, extension and internal rotation of her her right shoulder. No red flags.  -Right shoulder injection as above.  -Discussed about home exercise regimen -Patient to call and schedule follow up in [facility] if no improvement in her pain over the next 1-2 weeks. She was given the phone number to sport medicine clinic.

## 2016-09-07 ENCOUNTER — Encounter: Payer: Self-pay | Admitting: Internal Medicine

## 2016-09-12 ENCOUNTER — Ambulatory Visit (HOSPITAL_COMMUNITY)
Admission: RE | Admit: 2016-09-12 | Discharge: 2016-09-12 | Disposition: A | Discharge status: Home / Self Care | Payer: 59 | Source: Ambulatory Visit | Attending: Internal Medicine | Admitting: Internal Medicine

## 2016-09-12 ENCOUNTER — Ambulatory Visit (AMBULATORY_SURGERY_CENTER): Payer: 59 | Admitting: Internal Medicine

## 2016-09-12 ENCOUNTER — Telehealth: Payer: Self-pay

## 2016-09-12 ENCOUNTER — Encounter: Payer: Self-pay | Admitting: Internal Medicine

## 2016-09-12 VITALS — BP 120/84 | HR 54 | Temp 97.5°F | Resp 18 | Ht 65.0 in | Wt 208.0 lb

## 2016-09-12 DIAGNOSIS — R0789 Other chest pain: Secondary | ICD-10-CM | POA: Diagnosis not present

## 2016-09-12 DIAGNOSIS — K222 Esophageal obstruction: Secondary | ICD-10-CM

## 2016-09-12 DIAGNOSIS — R079 Chest pain, unspecified: Secondary | ICD-10-CM | POA: Diagnosis not present

## 2016-09-12 DIAGNOSIS — R1310 Dysphagia, unspecified: Secondary | ICD-10-CM

## 2016-09-12 DIAGNOSIS — R1319 Other dysphagia: Secondary | ICD-10-CM

## 2016-09-12 MED ORDER — IOPAMIDOL (ISOVUE-300) INJECTION 61%
INTRAVENOUS | Status: AC
  Administered 2016-09-12: 100 mL via OROMUCOSAL
  Filled 2016-09-12: qty 150

## 2016-09-12 MED ORDER — SODIUM CHLORIDE 0.9 % IV SOLN: 500.0000 mL | INTRAVENOUS | Status: DC

## 2016-09-12 NOTE — Op Note (Signed)
[facility] Patient Name: [name] Procedure Date: 09/12/2016 10:37 AM MRN: [id number] Endoscopist: Gatha Mayer , MD Age: 56 Referring MD:  Date of Birth: 09/08/1960 Gender: Female Account #: [id number] Procedure:                Upper GI endoscopy Indications:              Dysphagia Medicines:                Propofol per Anesthesia, Monitored Anesthesia Care Procedure:                Pre-Anesthesia Assessment:                           - Prior to the procedure, a History and Physical                            was performed, and patient medications and                            allergies were reviewed. The patient's tolerance of                            previous anesthesia was also reviewed. The risks                            and benefits of the procedure and the sedation                            options and risks were discussed with the patient.                            All questions were answered, and informed consent                            was obtained. Prior Anticoagulants: The patient has                            taken no previous anticoagulant or antiplatelet                            agents. ASA Grade Assessment: II - A patient with                            mild systemic disease. After reviewing the risks                            and benefits, the patient was deemed in                            satisfactory condition to undergo the procedure.                           After obtaining informed consent, the endoscope was  passed under direct vision. Throughout the                            procedure, the patient's blood pressure, pulse, and                            oxygen saturations were monitored continuously. The                            Endoscope was introduced through the mouth, and                            advanced to the second part of duodenum. The upper                            GI endoscopy was  accomplished without difficulty.                            The patient tolerated the procedure well. Scope In: Scope Out: Findings:                 A moderate Schatzki ring (acquired) was found at                            the gastroesophageal junction. The scope was                            withdrawn. Dilation was performed with a Maloney                            dilator with mild resistance at 100 Fr. The dilation                            site was examined following endoscope reinsertion                            and showed moderate improvement in luminal                            narrowing. Estimated blood loss was minimal.                           A 2 cm hiatal hernia was present.                           The exam was otherwise without abnormality.                           The cardia and gastric fundus were normal on                            retroflexion. Complications:            No immediate complications. Estimated Blood Loss:     Estimated blood loss was minimal. Impression:               -  Moderate Schatzki ring. Dilated.                           - 2 cm hiatal hernia.                           - The examination was otherwise normal.                           - No specimens collected. Recommendation:           - Patient has a contact number available for                            emergencies. The signs and symptoms of potential                            delayed complications were discussed with the                            patient. Return to normal activities tomorrow.                            Written discharge instructions were provided to the                            patient.                           - Clear liquids x 1 hour then soft foods rest of                            day. Start prior diet tomorrow.                           - Continue present medications.                           - Follow an antireflux regimen.                           - On  July 9 try to reduce to one pantoprazole in AM                           - Return to my office PRN. Gatha Mayer, MD 09/12/2016 11:02:25 AM This report has been signed electronically.

## 2016-09-12 NOTE — Progress Notes (Signed)
Once awakening in [facility] patient complained of throat discomfort. Denies chest or back discomfort. No subq air palpated in neck, chest or jaw.

## 2016-09-12 NOTE — Progress Notes (Signed)
Patient continues to complain of discomfort in lower throat, especially with movement and deep breathing. Dr. Carlean Purl in to check the patient. Patient will go for Gastrografin swallow.

## 2016-09-12 NOTE — Telephone Encounter (Signed)
Per Dr Carlean Purl ordered stat DG Esophagus with water soluable contrast to be done now at [facility] radiology and they will call us a report.

## 2016-09-12 NOTE — Progress Notes (Signed)
Spoke with radiologist Patient aware

## 2016-09-12 NOTE — Progress Notes (Signed)
Dental advisory given to [place] and oriented x3, pleased with MAC, report to RN

## 2016-09-12 NOTE — Progress Notes (Signed)
Patient to [facility] per private car. Patient stating "she is just not right". Discomfort 4.5/10.

## 2016-09-12 NOTE — Progress Notes (Signed)
Called to room to assist during endoscopic procedure.  Patient ID and intended procedure confirmed with present staff. Received instructions for my participation in the procedure from the performing physician.  

## 2016-09-12 NOTE — Patient Instructions (Addendum)
I found a ring - narrow spot in the esophagus. I dilated it and you should swallow better now.  Please see if you can reduce the pantoprazole to once a day before breakfast staring on July 9.  Follow a GERD diet. See handout.  If you continue to have problems after this come back to see me.  I appreciate the opportunity to care for you. Gatha Mayer, MD, [facility]  Handouts given: Post Dilatation Diet and Antireflux  YOU HAD AN ENDOSCOPIC PROCEDURE TODAY AT [place]:   Refer to the procedure report that was given to you for any specific questions about what was found during the examination.  If the procedure report does not answer your questions, please call your gastroenterologist to clarify.  If you requested that your care partner not be given the details of your procedure findings, then the procedure report has been included in a sealed envelope for you to review at your convenience later.  YOU SHOULD EXPECT: Some feelings of bloating in the abdomen. Passage of more gas than usual.  Walking can help get rid of the air that was put into your GI tract during the procedure and reduce the bloating. If you had a lower endoscopy (such as a colonoscopy or flexible sigmoidoscopy) you may notice spotting of blood in your stool or on the toilet paper. If you underwent a bowel prep for your procedure, you may not have a normal bowel movement for a few days.  Please Note:  You might notice some irritation and congestion in your nose or some drainage.  This is from the oxygen used during your procedure.  There is no need for concern and it should clear up in a day or so.  SYMPTOMS TO REPORT IMMEDIATELY:   Following upper endoscopy (EGD)  Vomiting of blood or coffee ground material  New chest pain or pain under the shoulder blades  Painful or persistently difficult swallowing  New shortness of breath  Fever of 100F or higher  Black, tarry-looking stools  For urgent or  emergent issues, a gastroenterologist can be reached at any hour by calling [phone].   DIET: clear liquids until 12:00 today. Soft foods only after 12:00 until tomorrow morning. Resume your regular diet in the am  ACTIVITY:  You should plan to take it easy for the rest of today and you should NOT DRIVE or use heavy machinery until tomorrow (because of the sedation medicines used during the test).    FOLLOW UP: Our staff will call the number listed on your records the next business day following your procedure to check on you and address any questions or concerns that you may have regarding the information given to you following your procedure. If we do not reach you, we will leave a message.  However, if you are feeling well and you are not experiencing any problems, there is no need to return our call.  We will assume that you have returned to your regular daily activities without incident.  If any biopsies were taken you will be contacted by phone or by letter within the next 1-3 weeks.  Please call us at [phone] if you have not heard about the biopsies in 3 weeks.    SIGNATURES/CONFIDENTIALITY: You and/or your care partner have signed paperwork which will be entered into your electronic medical record.  These signatures attest to the fact that that the information above on your After Visit Summary has been reviewed  and is understood.  Full responsibility of the confidentiality of this discharge information lies with you and/or your care-partner. 

## 2016-09-13 ENCOUNTER — Telehealth: Payer: Self-pay | Admitting: *Deleted

## 2016-09-13 NOTE — Telephone Encounter (Signed)
  Follow up Call-  Call back number 09/12/2016 07/18/2015  Post procedure Call Back phone  # [phone]  Permission to leave phone message Yes Yes  Some recent data might be hidden     Patient questions:  Do you have a fever, pain , or abdominal swelling? yes Pain Score  1  Have you tolerated food without any problems? Yes.    Have you been able to return to your normal activities? Yes.    Do you have any questions about your discharge instructions: Diet   No. Medications  No. Follow up visit  Yes.    Do you have questions or concerns about your Care? Yes.  Patient reached this afternoon for call back. She reports that she feels like she has a small"knot" in her throat that's a little tender, 1-10 on the the pain scale. She has not c/o dyspnea, coffee ground emesis or difficulty swallowing. She is going to eat lunch and was told to report if she has difficulty. Sm  Actions: * If pain score is 4 or above: No action needed, pain <4.

## 2016-09-13 NOTE — Telephone Encounter (Signed)
No answer left message and will attempt to call back later this afternoon. SM

## 2016-10-03 ENCOUNTER — Ambulatory Visit (INDEPENDENT_AMBULATORY_CARE_PROVIDER_SITE_OTHER): Payer: 59 | Admitting: Family Medicine

## 2016-10-03 ENCOUNTER — Encounter: Payer: Self-pay | Admitting: Family Medicine

## 2016-10-03 DIAGNOSIS — G2589 Other specified extrapyramidal and movement disorders: Secondary | ICD-10-CM | POA: Diagnosis not present

## 2016-10-03 DIAGNOSIS — M25511 Pain in right shoulder: Secondary | ICD-10-CM

## 2016-10-03 HISTORY — DX: Other specified extrapyramidal and movement disorders: G25.89

## 2016-10-03 NOTE — Progress Notes (Signed)
HPI  CC: Rt Shoulder pain Going on for a month. No injury/fall.   Traumatic: no  Location: rt shoulder; lateral and posterior aspect; occasional paresthesias down the right hand and fingers  Quality: throbbing/aching (type of pain)  Duration: 1 month  Timing: constant  Improving/Worsening: worse than 2-4 weeks ago  Makes better: nothing  Makes worse: lifting objects  Associated symptoms: some paresthesias on the Rt hand/fingers   Previous Interventions Tried: mobic, heat, ice, aleve, opiates, and shoulder injection   Past Injuries: none  Past Surgeries: hysterectomy  Smoking: prior smoker; quit ~35yrs ago  Family Hx: sister and 2 brothers w/ RC repairs  ROS: Per HPI; in addition no fever, no rash, no additional weakness, no additional numbness, no additional paresthesias, and no additional falls/injury.   Objective: BP 122/78   Ht 5\' 5"  (1.651 m)   Wt 208 lb (94.3 kg)   BMI 34.61 kg/m  Gen: Right-Hand Dominant. NAD, well groomed, a/o x3, normal affect.  CV: Well-perfused. Warm.  Resp: Non-labored.  Neuro: Sensation intact throughout. No gross coordination deficits.  Gait: Nonpathologic posture, unremarkable stride without signs of limp or balance issues. Neck: ROM intact in all directions. Spurling's was positive for "similar" symptoms with chin towards the right shoulder.  Shoulder, Right: No evidence of bony deformity, asymmetry, or muscle atrophy; No tenderness over long head of biceps (bicipital groove). No TTP at [facility] joint. Full passive range of motion but active ROM reduced 2/2 pain, Thumb to L4 without significant tenderness. Strength 4+/5 in ER and empty can test with obvious signs of guarding from pain, otherwise 5/5. Abnormal scapular function observed. Sensation intact. Peripheral pulses intact. DTRs +2 bilaterally  Special Tests:   - Crossarm test: Positive for pain in the posterior aspect of the glenoid   - Empty can: Positive for pain, negative for  weakness   - Hawkins: Unable to be examined due to pain   - Neer test: Positive   - Obrien's test: Positive   - Yergason's: NEG   - Speeds test: NEG  Assessment and plan:  Right shoulder pain Patient is here with complaints of right-sided shoulder pain, signs symptoms and physical exam findings were consistent with rotator cuff tendinitis and scapular dyskinesia. Weakness noted on exam was global making this likely secondary to pain. Cannot rule out impingement at this time. Patient had no improvement with steroid injection which may suggest continued irritation with persistent impingement. Less likely to have any rotator cuff tears without significant injury/trauma.   Patient also complained of some paresthesias in the affected arm. Spurling's was positive for reproducing "similar" but not identical symptoms. No additional evidence of radiculopathy on exam but I am unable to ruled out at this time. - I discussed treatment options at length with patient. I informed her of my recommendation for formal physical therapy, and rotator cuff strengthening exercises.  - Patient did not immediately refused this treatment but was unsure of whether she wanted to move forward with physical therapy. The decision was made to have patient discuss this with her husband and contact our office if she decided she wanted a referral to physical therapy.  - Patient encouraged to continue RICE therapy - I discussed with patient the possibility of getting radiographs of the C-spine to assess for degenerative disc disease that may warrant more thorough imaging.  - Patient was unsure whether she wanted to move forward with this option as well. The decision was made to have patient contact our office if  she and her husband decided to move forward with radiographs.  Scapular dyskinesis Patient refused physical therapy and home exercises at this time. - See plan above   30 minutes spent with patient. >50% of this time  spent in face-to-face consultation with this patient discussing diagnosis and treatment options.   Elberta Leatherwood, MD,MS [facility] 10/03/2016 5:18 PM

## 2016-10-03 NOTE — Assessment & Plan Note (Signed)
Patient refused physical therapy and home exercises at this time. - See plan above

## 2016-10-03 NOTE — Assessment & Plan Note (Addendum)
Patient is here with complaints of right-sided shoulder pain, signs symptoms and physical exam findings were consistent with rotator cuff tendinitis and scapular dyskinesia. Weakness noted on exam was global making this likely secondary to pain. Cannot rule out impingement at this time. Patient had no improvement with steroid injection which may suggest continued irritation with persistent impingement. Less likely to have any rotator cuff tears without significant injury/trauma.   Patient also complained of some paresthesias in the affected arm. Spurling's was positive for reproducing "similar" but not identical symptoms. No additional evidence of radiculopathy on exam but I am unable to ruled out at this time. - I discussed treatment options at length with patient. I informed her of my recommendation for formal physical therapy, and rotator cuff strengthening exercises.  - Patient did not immediately refused this treatment but was unsure of whether she wanted to move forward with physical therapy. The decision was made to have patient discuss this with her husband and contact our office if she decided she wanted a referral to physical therapy.  - Patient encouraged to continue RICE therapy - I discussed with patient the possibility of getting radiographs of the C-spine to assess for degenerative disc disease that may warrant more thorough imaging.  - Patient was unsure whether she wanted to move forward with this option as well. The decision was made to have patient contact our office if she and her husband decided to move forward with radiographs.

## 2016-10-04 NOTE — Addendum Note (Signed)
Addended by: Cyd Silence on: 10/04/2016 05:10 PM   Modules accepted: Orders

## 2016-10-08 ENCOUNTER — Ambulatory Visit (INDEPENDENT_AMBULATORY_CARE_PROVIDER_SITE_OTHER): Payer: 59 | Admitting: Internal Medicine

## 2016-10-08 ENCOUNTER — Encounter: Payer: Self-pay | Admitting: Internal Medicine

## 2016-10-08 DIAGNOSIS — M25511 Pain in right shoulder: Secondary | ICD-10-CM

## 2016-10-08 MED ORDER — TRAMADOL HCL 50 MG PO TABS: 50.0000 mg | ORAL_TABLET | Freq: Four times a day (QID) | ORAL | 0 refills | Status: DC | PRN

## 2016-10-08 MED FILL — traMADol HCL 50 MG TABS: 50 | 5 days supply | Qty: 20 | Fill #0

## 2016-10-08 NOTE — Patient Instructions (Signed)

## 2016-10-08 NOTE — Progress Notes (Signed)
   [facility] Phone: [phone]   Date of Visit: 10/08/2016   HPI:  Right Shoulder Pain and Neck Pain:  - symptoms for about 1 month  - pain in the right shoulder with radiation down her arm. She also has neck pain mainly with rotation to the right side.  - no specific event that she can think of.  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 She was offered PT and home exercises but patient declined at that time - she has also tried Meloxixam, Alleve, and shoulder injection without relief.  - symptoms have been worsening  - she reports she has notices weakness of her right hand like she was about to drop something she was holding.  - she also reports of paresthesias of the right arm - reports she had x-rays ordered by sports medicine but this has not been completed yet.   ROS: See HPI.  [place]:  PMH: Migraine Hot Flashes OSA GERD DM2 HLD VING1 Depression with anxiety Solitary pulmonary nodule   PHYSICAL EXAM: BP 128/78   Pulse 60   Temp 98.3 F (36.8 C) (Oral)   Ht 5\' 5"  (1.651 m)   Wt 209 lb 9.6 oz (95.1 kg)   SpO2 100%   BMI 34.88 kg/m  GEN: NAD MSK: no c-spine or t spine tenderness. Spurling test positive on both sides.  Some soreness to palpation of the right upper border of her trapezius. Soreness to palpation of the right rhomboid muscle area. No asymmetry of the shoulders. Tenderness to palpation of the [facility] joint area but otherwise no bony tenderness. No tenderness of the bicipital groove. ROM is limited due to pain (flexion and abduction) but I am able to passively move to almost full range. Norma internal rotation. Normal strength of upper extremities bilaterally. Pain with empty can test but no weakness. Pain with internal and external rotation against resistance but no weakness  noted. Normal radial and ulnar pulses bilaterally.  PSYCH: Mood and affect euthymic, normal rate and volume of speech NEURO: Awake, alert, no focal deficits grossly, normal speech   ASSESSMENT/PLAN:  Right shoulder pain Symptoms seem consistent with rotator cuff tendinopathy. No significant weakness noted on the exam. Patient would like to wait until x-rays are completed to make referral to PT, but is willing to do home ROM exercises which were provided. Since the pain medication she has tried is not helping, will prescribe Tramadol 50mg  q 6 PRN #20. She can also try ice/heat.    Smiley Houseman, MD PGY [place]

## 2016-10-08 NOTE — Assessment & Plan Note (Signed)
Symptoms seem consistent with rotator cuff tendinopathy. No significant weakness noted on the exam. Patient would like to wait until x-rays are completed to make referral to PT, but is willing to do home ROM exercises which were provided. Since the pain medication she has tried is not helping, will prescribe Tramadol 50mg  q 6 PRN #20. She can also try ice/heat.

## 2016-10-09 ENCOUNTER — Ambulatory Visit
Admission: RE | Admit: 2016-10-09 | Discharge: 2016-10-09 | Disposition: A | Discharge status: Home / Self Care | Payer: 59 | Source: Ambulatory Visit | Attending: Family Medicine | Admitting: Family Medicine

## 2016-10-09 DIAGNOSIS — M25511 Pain in right shoulder: Secondary | ICD-10-CM | POA: Diagnosis not present

## 2016-10-09 DIAGNOSIS — M50323 Other cervical disc degeneration at C6-C7 level: Secondary | ICD-10-CM | POA: Diagnosis not present

## 2016-10-09 DIAGNOSIS — M50322 Other cervical disc degeneration at C5-C6 level: Secondary | ICD-10-CM | POA: Diagnosis not present

## 2016-10-11 ENCOUNTER — Other Ambulatory Visit: Payer: Self-pay

## 2016-10-11 DIAGNOSIS — M25511 Pain in right shoulder: Secondary | ICD-10-CM

## 2016-10-17 DIAGNOSIS — M25511 Pain in right shoulder: Secondary | ICD-10-CM | POA: Diagnosis not present

## 2016-10-17 DIAGNOSIS — M5412 Radiculopathy, cervical region: Secondary | ICD-10-CM | POA: Diagnosis not present

## 2016-10-17 MED FILL — predniSONE 10 MG TABS: 10 | 6 days supply | Qty: 21 | Fill #0

## 2016-10-19 MED FILL — ACCU-CHEK GUIDE TEST STRIP: 90 days supply | Qty: 100 | Fill #1

## 2016-10-24 ENCOUNTER — Other Ambulatory Visit: Payer: Self-pay | Admitting: Student

## 2016-10-25 ENCOUNTER — Other Ambulatory Visit: Payer: Self-pay | Admitting: Obstetrics and Gynecology

## 2016-10-25 DIAGNOSIS — Z1231 Encounter for screening mammogram for malignant neoplasm of breast: Secondary | ICD-10-CM

## 2016-11-09 ENCOUNTER — Ambulatory Visit: Payer: 59 | Admitting: Obstetrics and Gynecology

## 2016-11-09 NOTE — Progress Notes (Deleted)
56 y.o. [id number] Married [place] American female here for annual exam.    PCP:     No LMP recorded. Patient has had a hysterectomy.           Sexually active: {yes no:314532}  The current method of family planning is status post hysterectomy.    Exercising: {yes no:314532}  {types:19826} Smoker:  no  Health Maintenance: Pap: 10-14-13 Neg:Neg HR HPV History of abnormal Pap:  Yes,  09/2011 LGSIL MMG: 12-09-15 Density C/Neg/BiRads1:TBC--**Appt.12-11-16** Colonoscopy: 2015 Normal - repeat 10years BMD:   n/a  Result  n/a TDaP:  2014 Gardasil:   {YES NO:22349} HIV: 01-22-14 NR Hep C: *** Screening Labs:  Hb today: ***, Urine today: ***   reports that she has quit smoking. Her smoking use included Cigarettes. She smoked 0.50 packs per day. She has never used smokeless tobacco. She reports that she drinks alcohol. She reports that she does not use drugs.  Past Medical History:  Diagnosis Date  . Anxiety   . Arthritis   . Depression   . Diabetes mellitus without complication ([place])   . GERD (gastroesophageal reflux disease)   . H/O varicella   . History of measles, mumps, or rubella   . Hx of ovarian cyst   . Hyperlipidemia   . Migraine headache   . Sleep apnea    pt denies  . Sleep deprivation    loss of sleep  . VAIN I (vaginal intraepithelial neoplasia grade I) 2013   pap and confirmed by colposcopic biopsy    Past Surgical History:  Procedure Laterality Date  . ABDOMINAL HYSTERECTOMY    . CHOLECYSTECTOMY  11/13/10  . COLONOSCOPY  Multiple  . DILATION AND CURETTAGE OF UTERUS    . ESOPHAGOGASTRODUODENOSCOPY    . OOPHORECTOMY  2009   bilateral salpingo-oophorectomy.  . TUBAL LIGATION      Current Outpatient Prescriptions  Medication Sig Dispense Refill  . aspirin-acetaminophen-caffeine (EXCEDRIN MIGRAINE) 250-250-65 MG tablet Take 1 tablet by mouth daily as needed for headache or migraine. Reported on 05/27/2015    . butalbital-acetaminophen-caffeine (FIORICET, ESGIC)  50-325-40 MG tablet Take 1 tablet by mouth every 6 (six) hours as needed for headache or migraine. (Patient not taking: Reported on 10/08/2016) 20 tablet 0  . estradiol (ESTRACE) 1 MG tablet Take 1 tablet (1 mg total) by mouth 2 (two) times daily. 180 tablet 3  . glucose blood (ACCU-CHEK AVIVA) test strip Use as instructed to check sugar one time daily 100 each 5  . meloxicam (MOBIC) 15 MG tablet Take 1 tablet (15 mg total) by mouth daily. Reported on 05/27/2015 30 tablet 0  . metFORMIN (GLUCOPHAGE-XR) 500 MG 24 hr tablet TAKE 1 TABLET BY MOUTH 2 TIMES DAILY WITH A MEAL. 180 tablet 1  . ondansetron (ZOFRAN-ODT) 4 MG disintegrating tablet Take 1 tablet (4 mg total) by mouth every 8 (eight) hours as needed for nausea or vomiting. (Patient not taking: Reported on 10/08/2016) 12 tablet 0  . pantoprazole (PROTONIX) 40 MG tablet Take 1 tablet (40 mg total) by mouth 2 (two) times daily before a meal. (Patient taking differently: Take 40 mg by mouth daily. ) 180 tablet 3  . Pitavastatin Calcium (LIVALO) 2 MG TABS Take 1 tablet (2 mg total) by mouth daily. 90 tablet 3  . traMADol (ULTRAM) 50 MG tablet Take 1 tablet (50 mg total) by mouth every 6 (six) hours as needed. 20 tablet 0   No current facility-administered medications for this visit.     Family  History  Problem Relation Age of Onset  . Hypertension Mother   . Hypotension Mother   . Anemia Mother        low iron  . Heart disease Sister   . Sickle cell trait Other   . Alcohol abuse Brother   . Alcohol abuse Brother   . Drug abuse Brother   . Colon cancer Neg Hx   . Esophageal cancer Neg Hx   . Stomach cancer Neg Hx     ROS:  Pertinent items are noted in HPI.  Otherwise, a comprehensive ROS was negative.  Exam:   There were no vitals taken for this visit.    General appearance: alert, cooperative and appears stated age Head: Normocephalic, without obvious abnormality, atraumatic Neck: no adenopathy, supple, symmetrical, trachea midline  and thyroid normal to inspection and palpation Lungs: clear to auscultation bilaterally Breasts: normal appearance, no masses or tenderness, No nipple retraction or dimpling, No nipple discharge or bleeding, No axillary or supraclavicular adenopathy Heart: regular rate and rhythm Abdomen: soft, non-tender; no masses, no organomegaly Extremities: extremities normal, atraumatic, no cyanosis or edema Skin: Skin color, texture, turgor normal. No rashes or lesions Lymph nodes: Cervical, supraclavicular, and axillary nodes normal. No abnormal inguinal nodes palpated Neurologic: Grossly normal  Pelvic: External genitalia:  no lesions              Urethra:  normal appearing urethra with no masses, tenderness or lesions              Bartholins and Skenes: normal                 Vagina: normal appearing vagina with normal color and discharge, no lesions              Cervix: no lesions              Pap taken: {yes no:314532} Bimanual Exam:  Uterus:  normal size, contour, position, consistency, mobility, non-tender              Adnexa: no mass, fullness, tenderness              Rectal exam: {yes no:314532}.  Confirms.              Anus:  normal sphincter tone, no lesions  Chaperone was present for exam.  Assessment:   Well woman visit with normal exam.   Plan: Mammogram screening discussed. Recommended self breast awareness. Pap and HR HPV as above. Guidelines for Calcium, Vitamin D, regular exercise program including cardiovascular and weight bearing exercise.   Follow up annually and prn.   Additional counseling given.  {yes [id number]. _______ minutes face to face time of which over 50% was spent in counseling.    After visit summary provided.

## 2016-11-09 NOTE — Progress Notes (Addendum)
56 y.o. [id number] Married [place] American female here for annual exam.    Having right pain.  Needs an MRI.   Decreased energy.  Hard to get up in the morning.  Declines doing sleep study.  Husband states she does have apnea symptoms.   Feels a painful vaginal lump.  Hx HSV 2.    Bladder symptoms are improved.  Drinking a lot of water.   PCP: Andrena Mews, MD    No LMP recorded. Patient has had a hysterectomy.           Sexually active: Yes.   female The current method of family planning is status post Tubal/hysterectomy.    Exercising: No.   Smoker:  no  Health Maintenance: Pap: 10-14-13 Neg:Neg HR HPV, 10-05-11 LSIL(colpo:CIN I History of abnormal Pap:  Yes, 09/2011 hx LGSIL,colposcopy revealing CIN I. MMG: 12-09-15 Density C/Neg/BiRads1:TBC Colonoscopy: 12-10-12 normal with Dr.Ganem;next due 11/2022. BMD:   n/a  Result  n/a TDaP:  2014 Gardasil:   no HIV: 01-22-14 NR Hep C: Unsure Screening Labs:  Hb today: PCP/Endocrinologist, Urine today: not done   reports that she has quit smoking. Her smoking use included Cigarettes. She smoked 0.50 packs per day. She has never used smokeless tobacco. She reports that she does not drink alcohol or use drugs.  Past Medical History:  Diagnosis Date  . Anxiety   . Arthritis   . Depression   . Diabetes mellitus without complication ([place])   . GERD (gastroesophageal reflux disease)   . H/O varicella   . History of measles, mumps, or rubella   . HSV-2 (herpes simplex virus 2) infection   . Hx of ovarian cyst   . Hyperlipidemia   . Migraine headache   . Sleep apnea    pt denies  . Sleep deprivation    loss of sleep  . VAIN I (vaginal intraepithelial neoplasia grade I) 2013   pap and confirmed by colposcopic biopsy    Past Surgical History:  Procedure Laterality Date  . ABDOMINAL HYSTERECTOMY    . CHOLECYSTECTOMY  11/13/10  . COLONOSCOPY  Multiple  . DILATION AND CURETTAGE OF UTERUS    . ESOPHAGOGASTRODUODENOSCOPY    .  OOPHORECTOMY  2009   bilateral salpingo-oophorectomy.  . TUBAL LIGATION      Current Outpatient Prescriptions  Medication Sig Dispense Refill  . aspirin-acetaminophen-caffeine (EXCEDRIN MIGRAINE) 250-250-65 MG tablet Take 1 tablet by mouth daily as needed for headache or migraine. Reported on 05/27/2015    . butalbital-acetaminophen-caffeine (FIORICET, ESGIC) 50-325-40 MG tablet Take 1 tablet by mouth every 6 (six) hours as needed for headache or migraine. 20 tablet 0  . estradiol (ESTRACE) 1 MG tablet Take 1 tablet (1 mg total) by mouth 2 (two) times daily. 180 tablet 3  . glucose blood (ACCU-CHEK AVIVA) test strip Use as instructed to check sugar one time daily 100 each 5  . meloxicam (MOBIC) 15 MG tablet Take 1 tablet (15 mg total) by mouth daily. Reported on 05/27/2015 30 tablet 0  . metFORMIN (GLUCOPHAGE-XR) 500 MG 24 hr tablet TAKE 1 TABLET BY MOUTH 2 TIMES DAILY WITH A MEAL. 180 tablet 1  . ondansetron (ZOFRAN-ODT) 4 MG disintegrating tablet Take 1 tablet (4 mg total) by mouth every 8 (eight) hours as needed for nausea or vomiting. 12 tablet 0  . pantoprazole (PROTONIX) 40 MG tablet Take 1 tablet (40 mg total) by mouth 2 (two) times daily before a meal. (Patient taking differently: Take 40 mg by mouth daily. )  180 tablet 3  . Pitavastatin Calcium (LIVALO) 2 MG TABS Take 1 tablet (2 mg total) by mouth daily. 90 tablet 3  . traMADol (ULTRAM) 50 MG tablet Take 1 tablet (50 mg total) by mouth every 6 (six) hours as needed. 20 tablet 0  . phentermine 37.5 MG capsule Take 1 capsule (37.5 mg total) by mouth every morning. 30 capsule 0  . valACYclovir (VALTREX) 500 MG tablet Take 1 tablet (500 mg total) by mouth 2 (two) times daily. Take for 3 days as needed. 30 tablet 1   No current facility-administered medications for this visit.     Family History  Problem Relation Age of Onset  . Hypertension Mother   . Hypotension Mother   . Anemia Mother        low iron  . Heart disease Sister   .  Sickle cell trait Other   . Alcohol abuse Brother   . Alcohol abuse Brother   . Drug abuse Brother   . Colon cancer Neg Hx   . Esophageal cancer Neg Hx   . Stomach cancer Neg Hx     ROS:  Pertinent items are noted in HPI.  Otherwise, a comprehensive ROS was negative.  Exam:   BP 130/82 (BP Location: Right Arm, Patient Position: Sitting, Cuff Size: Large)   Pulse 100   Resp 18   Ht 5\' 4"  (1.626 m)   Wt 203 lb 9.6 oz (92.4 kg)   BMI 34.95 kg/m     General appearance: alert, cooperative and appears stated age Head: Normocephalic, without obvious abnormality, atraumatic Neck: no adenopathy, supple, symmetrical, trachea midline and thyroid normal to inspection and palpation Lungs: clear to auscultation bilaterally Breasts: normal appearance, no masses or tenderness, No nipple retraction or dimpling, No nipple discharge or bleeding, No axillary or supraclavicular adenopathy Heart: regular rate and rhythm Abdomen: soft, non-tender; no masses, no organomegaly Extremities: extremities normal, atraumatic, no cyanosis or edema Skin: Skin color, texture, turgor normal. No rashes or lesions Lymph nodes: Cervical, supraclavicular, and axillary nodes normal. No abnormal inguinal nodes palpated Neurologic: Grossly normal  Pelvic: External genitalia:   right vulvar ulcer - painful.              Urethra:  normal appearing urethra with no masses, tenderness or lesions              Bartholins and Skenes: normal                 Vagina: normal appearing vagina with normal color and discharge, no lesions              Cervix:  Absent.              Pap taken: Yes.   Bimanual Exam:  Uterus:   Absent.              Adnexa: no mass, fullness, tenderness              Rectal exam: Yes.  .  Confirms.              Anus:  normal sphincter tone, no lesions  Chaperone was present for exam.  Assessment:   Well woman visit with normal exam. Status post hysterectomy.  Hx VAIN I. Vulvar ulcer. Hx HSV 2.   On ERT.  Desire for weight loss.   Plan: Mammogram screening discussed. Recommended self breast awareness. Pap and HR HPV as above. Guidelines for Calcium, Vitamin D, regular exercise program including cardiovascular  and weight bearing exercise. HSV culture not needed. Valtrex 500 mg po bid x 3 days.  Refill of ERT Estrace 1 mg daily.  I discussed potential risks of DVT, PE, stroke, and possible breast cancer.  Phentermine 37.5 mg daily.  I discussed potential risks and side effects.  Follow up in one month. Follow up annually and prn.    After visit summary provided.

## 2016-11-12 ENCOUNTER — Ambulatory Visit (INDEPENDENT_AMBULATORY_CARE_PROVIDER_SITE_OTHER): Payer: 59 | Admitting: Obstetrics and Gynecology

## 2016-11-12 ENCOUNTER — Other Ambulatory Visit (HOSPITAL_COMMUNITY)
Admission: RE | Admit: 2016-11-12 | Discharge: 2016-11-12 | Disposition: A | Discharge status: Home / Self Care | Payer: 59 | Source: Ambulatory Visit | Attending: Obstetrics and Gynecology | Admitting: Obstetrics and Gynecology

## 2016-11-12 ENCOUNTER — Encounter: Payer: Self-pay | Admitting: Obstetrics and Gynecology

## 2016-11-12 VITALS — BP 130/82 | HR 100 | Resp 18 | Ht 64.0 in | Wt 203.6 lb

## 2016-11-12 DIAGNOSIS — Z01419 Encounter for gynecological examination (general) (routine) without abnormal findings: Secondary | ICD-10-CM

## 2016-11-12 DIAGNOSIS — N766 Ulceration of vulva: Secondary | ICD-10-CM | POA: Diagnosis not present

## 2016-11-12 MED ORDER — ESTRADIOL 1 MG PO TABS: 1.0000 mg | ORAL_TABLET | Freq: Two times a day (BID) | ORAL | 3 refills | Status: DC

## 2016-11-12 MED ORDER — VALACYCLOVIR HCL 500 MG PO TABS: 500.0000 mg | ORAL_TABLET | Freq: Two times a day (BID) | ORAL | 1 refills | Status: DC

## 2016-11-12 MED ORDER — PHENTERMINE HCL 37.5 MG PO CAPS: 37.5000 mg | ORAL_CAPSULE | ORAL | 0 refills | Status: DC

## 2016-11-12 MED FILL — ESTRADIOL 1 MG TABLET: 1 | 90 days supply | Qty: 180 | Fill #0

## 2016-11-12 MED FILL — VALACYCLOVIR HCL 500 MG TAB: 500 | 15 days supply | Qty: 30 | Fill #0

## 2016-11-12 MED FILL — PHENTERMINE 37.5 MG TABLET: 37.5 | 30 days supply | Qty: 30 | Fill #0

## 2016-11-12 NOTE — Patient Instructions (Signed)
EXERCISE AND DIET:  We recommended that you start or continue a regular exercise program for good health. Regular exercise means any activity that makes your heart beat faster and makes you sweat.  We recommend exercising at least 30 minutes per day at least 3 days a week, preferably 4 or 5.  We also recommend a diet low in fat and sugar.  Inactivity, poor dietary choices and obesity can cause diabetes, heart attack, stroke, and kidney damage, among others.    ALCOHOL AND SMOKING:  Women should limit their alcohol intake to no more than 7 drinks/beers/glasses of wine (combined, not each!) per week. Moderation of alcohol intake to this level decreases your risk of breast cancer and liver damage. And of course, no recreational drugs are part of a healthy lifestyle.  And absolutely no smoking or even second hand smoke. Most people know smoking can cause heart and lung diseases, but did you know it also contributes to weakening of your bones? Aging of your skin?  Yellowing of your teeth and nails?  CALCIUM AND VITAMIN D:  Adequate intake of calcium and Vitamin D are recommended.  The recommendations for exact amounts of these supplements seem to change often, but generally speaking 600 mg of calcium (either carbonate or citrate) and 800 units of Vitamin D per day seems prudent. Certain women may benefit from higher intake of Vitamin D.  If you are among these women, your doctor will have told you during your visit.    PAP SMEARS:  Pap smears, to check for cervical cancer or precancers,  have traditionally been done yearly, although recent scientific advances have shown that most women can have pap smears less often.  However, every woman still should have a physical exam from her gynecologist every year. It will include a breast check, inspection of the vulva and vagina to check for abnormal growths or skin changes, a visual exam of the cervix, and then an exam to evaluate the size and shape of the uterus and  ovaries.  And after 55 years of age, a rectal exam is indicated to check for rectal cancers. We will also provide age appropriate advice regarding health maintenance, like when you should have certain vaccines, screening for sexually transmitted diseases, bone density testing, colonoscopy, mammograms, etc.   MAMMOGRAMS:  All women over 55 years old should have a yearly mammogram. Many facilities now offer a "3D" mammogram, which may cost around $50 extra out of pocket. If possible,  we recommend you accept the option to have the 3D mammogram performed.  It both reduces the number of women who will be called back for extra views which then turn out to be normal, and it is better than the routine mammogram at detecting truly abnormal areas.    COLONOSCOPY:  Colonoscopy to screen for colon cancer is recommended for all women at age 12.  We know, you hate the idea of the prep.  We agree, BUT, having colon cancer and not knowing it is worse!!  Colon cancer so often starts as a polyp that can be seen and removed at colonscopy, which can quite literally save your life!  And if your first colonoscopy is normal and you have no family history of colon cancer, most women don't have to have it again for 10 years.  Once every ten years, you can do something that may end up saving your life, right?  We will be happy to help you get it scheduled when you are ready.  Be sure to check your insurance coverage so you understand how much it will cost.  It may be covered as a preventative service at no cost, but you should check your particular policy.     Phentermine tablets or capsules What is this medicine? PHENTERMINE (FEN ter meen) decreases your appetite. It is used with a reduced calorie diet and exercise to help you lose weight. This medicine may be used for other purposes; ask your health care provider or pharmacist if you have questions. COMMON BRAND NAME(S): Adipex-P, Atti-Plex P, Atti-Plex P Spansule, Fastin,  Lomaira, Pro-Fast, Tara-8 What should I tell my health care provider before I take this medicine? They need to know if you have any of these conditions: -agitation -glaucoma -heart disease -high blood pressure -history of substance abuse -lung disease called Primary Pulmonary Hypertension (PPH) -taken an MAOI like Carbex, Eldepryl, Marplan, Nardil, or Parnate in last 14 days -thyroid disease -an unusual or allergic reaction to phentermine, other medicines, foods, dyes, or preservatives -pregnant or trying to get pregnant -breast-feeding How should I use this medicine? Take this medicine by mouth with a glass of water. Follow the directions on the prescription label. The instructions for use may differ based on the product and dose you are taking. Avoid taking this medicine in the evening. It may interfere with sleep. Take your doses at regular intervals. Do not take your medicine more often than directed. Talk to your pediatrician regarding the use of this medicine in children. While this drug may be prescribed for children 17 years or older for selected conditions, precautions do apply. Overdosage: If you think you have taken too much of this medicine contact a poison control center or emergency room at once. NOTE: This medicine is only for you. Do not share this medicine with others. What if I miss a dose? If you miss a dose, take it as soon as you can. If it is almost time for your next dose, take only that dose. Do not take double or extra doses. What may interact with this medicine? Do not take this medicine with any of the following medications: -duloxetine -MAOIs like Carbex, Eldepryl, Marplan, Nardil, and Parnate -medicines for colds or breathing difficulties like pseudoephedrine or phenylephrine -procarbazine -sibutramine -SSRIs like citalopram, escitalopram, fluoxetine, fluvoxamine, paroxetine, and sertraline -stimulants like dexmethylphenidate, methylphenidate or  modafinil -venlafaxine This medicine may also interact with the following medications: -medicines for diabetes This list may not describe all possible interactions. Give your health care provider a list of all the medicines, herbs, non-prescription drugs, or dietary supplements you use. Also tell them if you smoke, drink alcohol, or use illegal drugs. Some items may interact with your medicine. What should I watch for while using this medicine? Notify your physician immediately if you become short of breath while doing your normal activities. Do not take this medicine within 6 hours of bedtime. It can keep you from getting to sleep. Avoid drinks that contain caffeine and try to stick to a regular bedtime every night. This medicine was intended to be used in addition to a healthy diet and exercise. The best results are achieved this way. This medicine is only indicated for short-term use. Eventually your weight loss may level out. At that point, the drug will only help you maintain your new weight. Do not increase or in any way change your dose without consulting your doctor. You may get drowsy or dizzy. Do not drive, use machinery, or do anything that needs mental alertness  until you know how this medicine affects you. Do not stand or sit up quickly, especially if you are an older patient. This reduces the risk of dizzy or fainting spells. Alcohol may increase dizziness and drowsiness. Avoid alcoholic drinks. What side effects may I notice from receiving this medicine? Side effects that you should report to your doctor or health care professional as soon as possible: -chest pain, palpitations -depression or severe changes in mood -increased blood pressure -irritability -nervousness or restlessness -severe dizziness -shortness of breath -problems urinating -unusual swelling of the legs -vomiting Side effects that usually do not require medical attention (report to your doctor or health care  professional if they continue or are bothersome): -blurred vision or other eye problems -changes in sexual ability or desire -constipation or diarrhea -difficulty sleeping -dry mouth or unpleasant taste -headache -nausea This list may not describe all possible side effects. Call your doctor for medical advice about side effects. You may report side effects to FDA at 1-800-FDA-1088. Where should I keep my medicine? Keep out of the reach of children. This medicine can be abused. Keep your medicine in a safe place to protect it from theft. Do not share this medicine with anyone. Selling or giving away this medicine is dangerous and against the law. This medicine may cause accidental overdose and death if taken by other adults, children, or pets. Mix any unused medicine with a substance like cat litter or coffee grounds. Then throw the medicine away in a sealed container like a sealed bag or a coffee can with a lid. Do not use the medicine after the expiration date. Store at room temperature between 20 and 25 degrees C (68 and 77 degrees F). Keep container tightly closed. NOTE: This sheet is a summary. It may not cover all possible information. If you have questions about this medicine, talk to your doctor, pharmacist, or health care provider.  2018 Elsevier/Gold Standard (2014-12-10 12:53:15)

## 2016-11-14 LAB — CYTOLOGY - PAP: Diagnosis: NEGATIVE

## 2016-12-03 ENCOUNTER — Other Ambulatory Visit: Payer: Self-pay | Admitting: Internal Medicine

## 2016-12-04 ENCOUNTER — Ambulatory Visit (INDEPENDENT_AMBULATORY_CARE_PROVIDER_SITE_OTHER): Payer: 59 | Admitting: Internal Medicine

## 2016-12-04 ENCOUNTER — Encounter: Payer: Self-pay | Admitting: Internal Medicine

## 2016-12-04 VITALS — BP 120/82 | HR 97 | Ht 64.0 in | Wt 201.0 lb

## 2016-12-04 DIAGNOSIS — E785 Hyperlipidemia, unspecified: Secondary | ICD-10-CM | POA: Diagnosis not present

## 2016-12-04 DIAGNOSIS — E538 Deficiency of other specified B group vitamins: Secondary | ICD-10-CM | POA: Diagnosis not present

## 2016-12-04 DIAGNOSIS — E1165 Type 2 diabetes mellitus with hyperglycemia: Secondary | ICD-10-CM

## 2016-12-04 DIAGNOSIS — R946 Abnormal results of thyroid function studies: Secondary | ICD-10-CM

## 2016-12-04 DIAGNOSIS — E559 Vitamin D deficiency, unspecified: Secondary | ICD-10-CM | POA: Diagnosis not present

## 2016-12-04 DIAGNOSIS — R7989 Other specified abnormal findings of blood chemistry: Secondary | ICD-10-CM | POA: Insufficient documentation

## 2016-12-04 DIAGNOSIS — E569 Vitamin deficiency, unspecified: Secondary | ICD-10-CM | POA: Insufficient documentation

## 2016-12-04 LAB — TSH: TSH: 0.97 u[IU]/mL (ref 0.35–4.50)

## 2016-12-04 LAB — T3, FREE: T3, Free: 3.4 pg/mL (ref 2.3–4.2)

## 2016-12-04 LAB — T4, FREE: Free T4: 0.77 ng/dL (ref 0.60–1.60)

## 2016-12-04 LAB — POCT GLYCOSYLATED HEMOGLOBIN (HGB A1C): Hemoglobin A1C: 6.2

## 2016-12-04 MED ORDER — VITAMIN D (ERGOCALCIFEROL) 1.25 MG (50000 UNIT) PO CAPS: 50000.0000 [IU] | ORAL_CAPSULE | ORAL | 1 refills | Status: DC

## 2016-12-04 MED FILL — VIT D2 1.25 MG (50,000 UNIT: 1.25 MG | 84 days supply | Qty: 12 | Fill #0

## 2016-12-04 NOTE — Patient Instructions (Addendum)
Please start Ergocalciferol 50,000 units weekly.  Let's continue with B12 monthly injections.  Please stop at the lab.  Please continue: - Metformin 500 mg once a day with dinner.  Please return in 4 months with your sugar log.

## 2016-12-04 NOTE — Progress Notes (Signed)
Patient ID: [name], female   DOB: 1960-08-17, 56 y.o.   MRN: [id number]  HPI: [name] is a 56 y.o.-year-old female, returning for f/u for DM2; prediabetes dx early 2000s, controlled, without long term complications, hyperlipidemia, subclinical thyrotoxicosis. Also, vitamin D deficiency and low vitamin B12.Last visit 4 mo ago.  She lost 10 lbs since last visit >> started Phentermine 3 weeks ago.  Last hemoglobin A1c was: Lab Results  Component Value Date   HGBA1C 6.2 08/03/2016   HGBA1C 5.9 04/06/2016   HGBA1C 6.4 12/13/2015  She had several steroid inj in last year- last in 04/2014.   She is on: - Metformin ER 500 mg 2x a day >> we had to decrease to once a day 2/2 GI discomfort. She was previously on: - Tradjenta 5 mg in am, before b'fast  - Invokana 100 mg in am - started 03/2015 >> she had CP, HA, lightheadedness - regular metformin >> diarrhea/nausea with regular metformin.  Pt checks sugars 0-1x a day: - am: 95-105 >> 80-90s >> 95-98 >> 95-103 - 2h after b'fast: 120-145 >> n/c >> 102 >> n/c - lunch: 95-100 >> 95-100 >> 90s >> n/c - 2h after lunch:120-145 >> 95-100 >> n/c - before dinner (snacking):80-100 >> n/c - 2h after dinner:  n/c >> 96-100s, 135 x1 >> up to 120 - bedtime: 85-98 >> 85-100 >> n/c No Lows. Lowest sugar was 80s >> 90s >> 95; ? if she has hypoglycemia awareness.  Highest: 145 >> 135 >> 120.  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  - No CKD, last BUN/creatinine:  Lab Results  Component Value Date   BUN 10 05/15/2016   CREATININE 0.97 05/15/2016   - last eye exam was in 05/2016 >> No DR. - she denies numbness and tingling in her feet  HL: Reviewed last 3 Lipid sets: - she is taking Livalo 2 mg daily >> Lipids  improved: Lab Results  Component Value Date   CHOL 167 04/19/2016   CHOL 179 04/26/2015   CHOL 266 (H) 02/03/2015   Lab Results  Component Value Date   HDL 44.50 04/19/2016   HDL 42.60 04/26/2015   HDL 44.40 02/03/2015   Lab Results  Component Value Date   LDLCALC 102 (H) 04/19/2016   LDLCALC 123 (H) 04/26/2015   LDLCALC 196 (H) 02/03/2015   Lab Results  Component Value Date   TRIG 101.0 04/19/2016   TRIG 66.0 04/26/2015   TRIG 128.0 02/03/2015   Lab Results  Component Value Date   CHOLHDL 4 04/19/2016   CHOLHDL 4 04/26/2015   CHOLHDL 6 02/03/2015   Normal LFTs: Lab Results  Component Value Date   ALT 16 05/15/2016   AST 20 05/15/2016   ALKPHOS 79 05/15/2016   BILITOT <0.1 (L) 05/15/2016   At last OV, she c/o fatigue. We checked her vitamin B12 and D and they were low: Lab Results  Component Value Date   VD25OH 17.98 (L) 08/03/2016   VD25OH 9 (L) 05/13/2015   Lab Results  Component Value Date   VITAMINB12 315 08/03/2016   VITAMINB12 428 05/13/2015   VITAMINB12 496 01/22/2014   I advised her to start:  Vitamin D 4000 units daily >> started but stopped  Vitamin B12 5000  units daily >> started but stopped She wants to limit pill burden.  She has subclinical thyrotoxicosis - in last year - we have been following this expectantly: Lab Results  Component Value Date   TSH 0.34 (L) 08/03/2016   TSH 0.430 (L) 06/08/2016   TSH 0.77 05/13/2015   TSH 0.619 01/22/2014   TSH 0.730 10/14/2013   TSH 0.492 09/01/2012   TSH 0.745 10/05/2011   Lab Results  Component Value Date   FREET4 0.79 08/03/2016   T3FREE 3.6 08/03/2016   She has dysphagia >> has Es stretching.  ROS: Constitutional: + weight loss, no fatigue, no subjective hyperthermia, no subjective hypothermia Eyes: no blurry vision, no xerophthalmia ENT: no sore throat, no nodules palpated in throat, no dysphagia, no odynophagia, no hoarseness Cardiovascular: no CP/no SOB/no palpitations/no leg  swelling Respiratory: no cough/no SOB/no wheezing Gastrointestinal: no N/no V/no D/no C/no acid reflux Musculoskeletal: no muscle aches/no joint aches Skin: no rashes, no hair loss Neurological: no tremors/no numbness/no tingling/no dizziness  I reviewed pt's medications, allergies, PMH, social hx, family hx, and changes were documented in the history of present illness. Otherwise, unchanged from my initial visit note.  PE: BP 120/82 (BP Location: Left Arm, Patient Position: Sitting)   Pulse 97   Ht 5\' 4"  (1.626 m)   Wt 201 lb (91.2 kg)   SpO2 98%   BMI 34.50 kg/m  Body mass index is 34.5 kg/m. Wt Readings from Last 3 Encounters:  12/04/16 201 lb (91.2 kg)  11/12/16 203 lb 9.6 oz (92.4 kg)  10/08/16 209 lb 9.6 oz (95.1 kg)   Constitutional: overweight, in NAD Eyes: PERRLA, EOMI, no exophthalmos ENT: moist mucous membranes, no thyromegaly, no cervical lymphadenopathy Cardiovascular: tachycardia (just started phentermine), RR, No MRG Respiratory: CTA B Gastrointestinal: abdomen soft, NT, ND, BS+ Musculoskeletal: no deformities, strength intact in all 4 Skin: moist, warm, no rashes Neurological: no tremor with outstretched hands, DTR normal in all 4  ASSESSMENT: 1. DM2, non-insulin dependent, without complications, controlled  2. HL  3. Low TSH  4. Fatigue  PLAN:  1. Patient with DM2, on low dose metformin ER, with good control.  - she will check if Freestyle Libre CGM is covered - I suggested to: Patient Instructions  Please start Ergocalciferol 50,000 units weekly.  Let's continue with B12 monthly injections.  Please stop at the lab.  Please continue: - Metformin 500 mg once a day with dinner.  Please return in 4 months with your sugar log.   - today, HbA1c is 6.2% (stable) - continue checking sugars at different times of the day - check 1x a day, rotating checks - advised for yearly eye exams >> she is UTD - Return to clinic in 4 mo with sugar log    2.  HL - doing well on Livalo >> LDL at last check 04/2016 was better - continue same Livalo dose  3. Low TSH - subclinical thyrotoxicosis, managed expectantly - she has tachycardia and wt loss  - but on phentermine - will check TFTs today  4. Low vitamin D and B12 - she would not want to use a daily supplement >> will start monthly B12 inj (gave one today), vit D 50,000 units weekly  Component     Latest Ref Rng & Units 12/04/2016  TSH     0.35 - 4.50 uIU/mL 0.97  T4,Free(Direct)     0.60 - 1.60 ng/dL 0.77  Triiodothyronine,Free,Serum     2.3 - 4.2 pg/mL 3.4  Normal TFTs. Will continue to monitor, but no intervention is needed for now.  Philemon Kingdom, MD PhD [facility] Endocrinology

## 2016-12-07 ENCOUNTER — Ambulatory Visit: Payer: 59 | Admitting: Obstetrics and Gynecology

## 2016-12-07 ENCOUNTER — Ambulatory Visit: Payer: Self-pay | Admitting: Family Medicine

## 2016-12-10 ENCOUNTER — Encounter: Payer: Self-pay | Admitting: Family Medicine

## 2016-12-11 ENCOUNTER — Ambulatory Visit (INDEPENDENT_AMBULATORY_CARE_PROVIDER_SITE_OTHER): Payer: 59 | Admitting: Family Medicine

## 2016-12-11 ENCOUNTER — Ambulatory Visit
Admission: RE | Admit: 2016-12-11 | Discharge: 2016-12-11 | Disposition: A | Discharge status: Home / Self Care | Payer: 59 | Source: Ambulatory Visit | Attending: Obstetrics and Gynecology | Admitting: Obstetrics and Gynecology

## 2016-12-11 ENCOUNTER — Encounter: Payer: Self-pay | Admitting: Family Medicine

## 2016-12-11 VITALS — BP 118/60 | HR 74 | Temp 98.3°F | Ht 64.0 in | Wt 202.0 lb

## 2016-12-11 DIAGNOSIS — E1165 Type 2 diabetes mellitus with hyperglycemia: Secondary | ICD-10-CM

## 2016-12-11 DIAGNOSIS — Z1231 Encounter for screening mammogram for malignant neoplasm of breast: Secondary | ICD-10-CM | POA: Diagnosis not present

## 2016-12-11 DIAGNOSIS — M25511 Pain in right shoulder: Secondary | ICD-10-CM

## 2016-12-11 DIAGNOSIS — Z23 Encounter for immunization: Secondary | ICD-10-CM | POA: Diagnosis not present

## 2016-12-11 DIAGNOSIS — M542 Cervicalgia: Secondary | ICD-10-CM | POA: Diagnosis not present

## 2016-12-11 DIAGNOSIS — Z1159 Encounter for screening for other viral diseases: Secondary | ICD-10-CM

## 2016-12-11 DIAGNOSIS — M5412 Radiculopathy, cervical region: Secondary | ICD-10-CM | POA: Insufficient documentation

## 2016-12-11 DIAGNOSIS — M5010 Cervical disc disorder with radiculopathy, unspecified cervical region: Secondary | ICD-10-CM | POA: Diagnosis not present

## 2016-12-11 DIAGNOSIS — G8929 Other chronic pain: Secondary | ICD-10-CM | POA: Diagnosis not present

## 2016-12-11 MED ORDER — NAPROXEN 500 MG PO TBEC: 500.0000 mg | DELAYED_RELEASE_TABLET | Freq: Two times a day (BID) | ORAL | 1 refills | Status: DC | PRN

## 2016-12-11 MED FILL — NAPROXEN DR 500 MG TABLET: 500 | 30 days supply | Qty: 60 | Fill #0

## 2016-12-11 NOTE — Assessment & Plan Note (Signed)
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 Xray of her shoulder and neck reviewed and discussed with her. MRI C-spine done in 2013 also reviewed and discussed with her. Plan to repeat C-spine MRI and shoulder MRI ordered. I also referred her for Nerve stimulation testing. PT referral done. Naproxen ordered prn pain. F/U as needed.

## 2016-12-11 NOTE — Patient Instructions (Signed)
Cervical Radiculopathy  Cervical radiculopathy means that a nerve in the neck is pinched or bruised. This can cause pain or loss of feeling (numbness) that runs from your neck to your arm and fingers.  Follow these instructions at home:  Managing pain  ? Take over-the-counter and prescription medicines only as told by your doctor.  ? If directed, put ice on the injured or painful area.  ? Put ice in a plastic bag.  ? Place a towel between your skin and the bag.  ? Leave the ice on for 20 minutes, 2?3 times per day.  ? If ice does not help, you can try using heat. Take a warm shower or warm bath, or use a heat pack as told by your doctor.  ? You may try a gentle neck and shoulder massage.  Activity  ? Rest as needed. Follow instructions from your doctor about any activities to avoid.  ? Do exercises as told by your doctor or physical therapist.  General instructions  ? If you were given a soft collar, wear it as told by your doctor.  ? Use a flat pillow when you sleep.  ? Keep all follow-up visits as told by your doctor. This is important.  Contact a doctor if:  ? Your condition does not improve with treatment.  Get help right away if:  ? Your pain gets worse and is not controlled with medicine.  ? You lose feeling or feel weak in your hand, arm, face, or leg.  ? You have a fever.  ? You have a stiff neck.  ? You cannot control when you poop or pee (have incontinence).  ? You have trouble with walking, balance, or talking.  This information is not intended to replace advice given to you by your health care provider. Make sure you discuss any questions you have with your health care provider.  Document Released: 02/22/2011 Document Revised: 08/11/2015 Document Reviewed: 04/29/2014  Elsevier Interactive Patient Education ? 2018 Elsevier Inc.

## 2016-12-11 NOTE — Progress Notes (Signed)
Subjective:     Patient ID: [name], female   DOB: 1960-10-20, 56 y.o.   MRN: [id number]  Shoulder Pain   The pain is present in the right shoulder (Pain radiates to her right elbow, and right hand. She has started droping things now.). This is a chronic (Right shoulder pain x 3 months.) problem. Episode onset: 3 months. There has been no history of extremity trauma. The problem occurs constantly. The problem has been gradually worsening. The quality of the pain is described as aching and sharp. The pain is at a severity of 9/10. The pain is moderate. Pertinent negatives include no fever, joint locking, numbness or stiffness. The symptoms are aggravated by activity. She has tried NSAIDS for the symptoms. The treatment provided no relief. Family history does not include gout. Her past medical history is significant for diabetes.  DM2: She follow-up regularly with her endocrinologist and she is compliant with meds, no concern today. DV:VOHY Hep C screening and vaccination update. She had mammogram this morning.   Current Outpatient Prescriptions on File Prior to Visit  Medication Sig Dispense Refill  . aspirin-acetaminophen-caffeine (EXCEDRIN MIGRAINE) 250-250-65 MG tablet Take 1 tablet by mouth daily as needed for headache or migraine. Reported on 05/27/2015    . butalbital-acetaminophen-caffeine (FIORICET, ESGIC) 50-325-40 MG tablet Take 1 tablet by mouth every 6 (six) hours as needed for headache or migraine. 20 tablet 0  . estradiol (ESTRACE) 1 MG tablet Take 1 tablet (1 mg total) by mouth 2 (two) times daily. 180 tablet 3  . glucose blood (ACCU-CHEK AVIVA) test strip Use as instructed to check sugar one time daily 100 each 5  . meloxicam (MOBIC) 15 MG tablet Take 1 tablet (15 mg total) by mouth daily. Reported on 05/27/2015 30 tablet 0  . metFORMIN (GLUCOPHAGE-XR) 500 MG 24 hr tablet TAKE 1 TABLET BY MOUTH 2 TIMES DAILY WITH A MEAL. 180 tablet 1  . ondansetron (ZOFRAN-ODT) 4 MG  disintegrating tablet Take 1 tablet (4 mg total) by mouth every 8 (eight) hours as needed for nausea or vomiting. 12 tablet 0  . pantoprazole (PROTONIX) 40 MG tablet Take 1 tablet (40 mg total) by mouth 2 (two) times daily before a meal. (Patient taking differently: Take 40 mg by mouth daily. ) 180 tablet 3  . phentermine 37.5 MG capsule Take 1 capsule (37.5 mg total) by mouth every morning. 30 capsule 0  . Pitavastatin Calcium (LIVALO) 2 MG TABS Take 1 tablet (2 mg total) by mouth daily. 90 tablet 3  . traMADol (ULTRAM) 50 MG tablet Take 1 tablet (50 mg total) by mouth every 6 (six) hours as needed. 20 tablet 0  . valACYclovir (VALTREX) 500 MG tablet Take 1 tablet (500 mg total) by mouth 2 (two) times daily. Take for 3 days as needed. 30 tablet 1  . Vitamin D, Ergocalciferol, (DRISDOL) 50000 units CAPS capsule Take 1 capsule (50,000 Units total) by mouth every 7 (seven) days. 12 capsule 1   No current facility-administered medications on file prior to visit.    Past Medical History:  Diagnosis Date  . Anxiety   . Arthritis   . Chest pain 05/15/2015  . Depression   . Diabetes mellitus without complication ([place])   . GERD (gastroesophageal reflux disease)   . H/O varicella   . History of measles, mumps, or rubella   . HSV-2 (herpes simplex virus 2) infection   . Hx of ovarian cyst   . Hyperlipidemia   . Migraine headache   .  Sleep apnea    pt denies  . Sleep deprivation    loss of sleep  . VAIN I (vaginal intraepithelial neoplasia grade I) 2013   pap and confirmed by colposcopic biopsy      Review of Systems  Constitutional: Negative for fever.  Respiratory: Negative.   Cardiovascular: Negative.   Gastrointestinal: Negative.   Musculoskeletal: Positive for arthralgias. Negative for joint swelling and stiffness.       Shoulder pain  Neurological: Negative for numbness.  All other systems reviewed and are negative.      Objective:   Physical Exam  Constitutional: She  appears well-developed. No distress.  Cardiovascular: Normal rate, regular rhythm and normal heart sounds.   No murmur heard. Pulmonary/Chest: Effort normal and breath sounds normal. No respiratory distress. She has no wheezes.  Abdominal: Soft. Bowel sounds are normal. She exhibits no distension and no mass. There is no tenderness.  Musculoskeletal: She exhibits no edema.       Right shoulder: She exhibits decreased range of motion and tenderness. She exhibits no bony tenderness, no swelling and no crepitus.       Left shoulder: Normal.       Right elbow: She exhibits normal range of motion.       Right wrist: Normal.       Cervical back: She exhibits decreased range of motion and tenderness. She exhibits no swelling and no deformity.  Nursing note and vitals reviewed.      Assessment:     Right shoulder pain and neck pain: Cervical radiculopathy DM2 Health maintenance    Plan:     Check problem list.  For HM: Flu shot given today. She had mammogram screening done today.               Tdap offered, she stated she already got it.                Hep C screening done.                I will contact her with results.

## 2016-12-11 NOTE — Assessment & Plan Note (Signed)
She is compliance with Metformin. She is compliance with Endocrine f/u. Urine microalbumin checked today. I will call her with test result.

## 2016-12-12 ENCOUNTER — Telehealth: Payer: Self-pay | Admitting: *Deleted

## 2016-12-12 LAB — HEPATITIS C ANTIBODY: Hep C Virus Ab: <0.1 s/co ratio (ref 0.0–0.9)

## 2016-12-12 LAB — MICROALBUMIN / CREATININE URINE RATIO
Creatinine, Urine: 53.3 mg/dL
Microalb/Creat Ratio: <5.6 mg/g creat (ref 0.0–30.0)
Microalbumin, Urine: <3 ug/mL

## 2016-12-12 NOTE — Telephone Encounter (Signed)
Patient called stating the cost of MRI at [facility] would be $648 out of pocket. Patient stated that is to much. She is requesting to schedule an appointment with [facility]. Please give her a call on her cell phone. Number on file is correct.  Derl Barrow, RN

## 2016-12-13 ENCOUNTER — Encounter: Payer: Self-pay | Admitting: Family Medicine

## 2016-12-13 NOTE — Telephone Encounter (Signed)
Changed MRI to 12-21-16 at 12pm at [facility].  Patient will need to arrive at 11:45am to get registered.  Tried calling but our phone doesn't work for outgoing at this time.  Will try again at lunch.  Jazmin Hartsell,CMA

## 2016-12-13 NOTE — Telephone Encounter (Signed)
Patient is aware of appointment date and time. Jazmin Hartsell,CMA

## 2016-12-21 ENCOUNTER — Ambulatory Visit (HOSPITAL_COMMUNITY): Payer: 59

## 2016-12-23 ENCOUNTER — Other Ambulatory Visit: Payer: Self-pay

## 2016-12-24 ENCOUNTER — Ambulatory Visit (HOSPITAL_COMMUNITY)
Admission: RE | Admit: 2016-12-24 | Discharge: 2016-12-24 | Disposition: A | Discharge status: Home / Self Care | Payer: 59 | Source: Ambulatory Visit | Attending: Family Medicine | Admitting: Family Medicine

## 2016-12-24 DIAGNOSIS — G8929 Other chronic pain: Secondary | ICD-10-CM | POA: Insufficient documentation

## 2016-12-24 DIAGNOSIS — M542 Cervicalgia: Secondary | ICD-10-CM

## 2016-12-24 DIAGNOSIS — M4802 Spinal stenosis, cervical region: Secondary | ICD-10-CM | POA: Insufficient documentation

## 2016-12-24 DIAGNOSIS — M7551 Bursitis of right shoulder: Secondary | ICD-10-CM | POA: Diagnosis not present

## 2016-12-24 DIAGNOSIS — R9089 Other abnormal findings on diagnostic imaging of central nervous system: Secondary | ICD-10-CM | POA: Diagnosis not present

## 2016-12-24 DIAGNOSIS — M5010 Cervical disc disorder with radiculopathy, unspecified cervical region: Secondary | ICD-10-CM | POA: Diagnosis present

## 2016-12-24 DIAGNOSIS — M19011 Primary osteoarthritis, right shoulder: Secondary | ICD-10-CM | POA: Diagnosis not present

## 2016-12-24 DIAGNOSIS — M25511 Pain in right shoulder: Secondary | ICD-10-CM

## 2016-12-24 DIAGNOSIS — M50122 Cervical disc disorder at C5-C6 level with radiculopathy: Secondary | ICD-10-CM | POA: Diagnosis not present

## 2016-12-24 DIAGNOSIS — M75101 Unspecified rotator cuff tear or rupture of right shoulder, not specified as traumatic: Secondary | ICD-10-CM | POA: Insufficient documentation

## 2016-12-24 LAB — POCT I-STAT CREATININE: Creatinine, Ser: 0.9 mg/dL (ref 0.44–1.00)

## 2016-12-24 MED ORDER — GADOBENATE DIMEGLUMINE 529 MG/ML IV SOLN
20.0000 mL | Freq: Once | INTRAVENOUS | Status: AC | PRN
  Administered 2016-12-24: 20 mL via INTRAVENOUS

## 2016-12-25 ENCOUNTER — Telehealth: Payer: Self-pay | Admitting: Family Medicine

## 2016-12-25 ENCOUNTER — Telehealth: Payer: Self-pay | Admitting: *Deleted

## 2016-12-25 DIAGNOSIS — C719 Malignant neoplasm of brain, unspecified: Secondary | ICD-10-CM

## 2016-12-25 DIAGNOSIS — S46812S Strain of other muscles, fascia and tendons at shoulder and upper arm level, left arm, sequela: Principal | ICD-10-CM

## 2016-12-25 DIAGNOSIS — M4802 Spinal stenosis, cervical region: Secondary | ICD-10-CM

## 2016-12-25 DIAGNOSIS — M19011 Primary osteoarthritis, right shoulder: Secondary | ICD-10-CM

## 2016-12-25 DIAGNOSIS — M7550 Bursitis of unspecified shoulder: Secondary | ICD-10-CM

## 2016-12-25 DIAGNOSIS — IMO0001 Reserved for inherently not codable concepts without codable children: Secondary | ICD-10-CM

## 2016-12-25 NOTE — Telephone Encounter (Signed)
I called and discussed cervical spine and right shoulder MRI result with patient. She later came in with her husband Roselie Awkward) and I went over the result with her. All questions were answered.  Plan: orthopedic referral for right shoulder pain and Cervical spine stenosis. She requested referral to [facility] orthopedic. MRI brain to r/o brain tumor. F/U as needed.

## 2016-12-25 NOTE — Telephone Encounter (Signed)
Patient informed of appointment for MRI on Saturday 12-29-16. Jazmin Hartsell,CMA

## 2016-12-25 NOTE — Telephone Encounter (Signed)
Opal Sidles from [facility]iology called report. Main concern from MRI of Cervical Spine is Impression #1:  Evidence of increased indistinct and enhancing soft tissue along the caudal midline cerebellum since 2015. 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  Call with questions [phone].  Derl Barrow, RN

## 2016-12-28 ENCOUNTER — Ambulatory Visit: Payer: Self-pay | Admitting: Family Medicine

## 2016-12-29 ENCOUNTER — Ambulatory Visit (HOSPITAL_COMMUNITY)
Admission: RE | Admit: 2016-12-29 | Discharge: 2016-12-29 | Disposition: A | Discharge status: Home / Self Care | Payer: 59 | Source: Ambulatory Visit | Attending: Family Medicine | Admitting: Family Medicine

## 2016-12-29 DIAGNOSIS — R930 Abnormal findings on diagnostic imaging of skull and head, not elsewhere classified: Secondary | ICD-10-CM | POA: Diagnosis not present

## 2016-12-29 DIAGNOSIS — C719 Malignant neoplasm of brain, unspecified: Secondary | ICD-10-CM | POA: Insufficient documentation

## 2016-12-29 MED ORDER — GADOBENATE DIMEGLUMINE 529 MG/ML IV SOLN
20.0000 mL | Freq: Once | INTRAVENOUS | Status: AC
  Administered 2016-12-29: 20 mL via INTRAVENOUS

## 2016-12-31 ENCOUNTER — Telehealth: Payer: Self-pay | Admitting: Family Medicine

## 2016-12-31 DIAGNOSIS — G8929 Other chronic pain: Secondary | ICD-10-CM

## 2016-12-31 DIAGNOSIS — R51 Headache: Secondary | ICD-10-CM

## 2016-12-31 DIAGNOSIS — R519 Headache, unspecified: Secondary | ICD-10-CM

## 2016-12-31 DIAGNOSIS — C719 Malignant neoplasm of brain, unspecified: Secondary | ICD-10-CM

## 2016-12-31 DIAGNOSIS — M25511 Pain in right shoulder: Secondary | ICD-10-CM | POA: Diagnosis not present

## 2016-12-31 NOTE — Telephone Encounter (Signed)
MRI result discussed with patient. Benign nodular brain tumor remains stable since 2015. May watch. However, she is concern about worsening headache which might be related to the tumor. She will like a second opinion from the neurosurg. She requested referral to Dr. Ashok Pall, MD . Referral order placed.

## 2017-01-01 ENCOUNTER — Telehealth: Payer: Self-pay | Admitting: Obstetrics and Gynecology

## 2017-01-01 NOTE — Telephone Encounter (Signed)
Patient canceled her upcoming 4 week recheck appointment 01/04/17. Patient is having "other health issues that need to be addressed". Patient said that she would send Dr.Silva a MyChart message with details of her current health status. Patient mentioned that she she is expecting to have surgery.

## 2017-01-01 NOTE — Telephone Encounter (Signed)
Thank you for the update.  I have closed the encounter.  

## 2017-01-03 ENCOUNTER — Other Ambulatory Visit: Payer: Self-pay | Admitting: Obstetrics and Gynecology

## 2017-01-03 NOTE — Telephone Encounter (Signed)
Medication refill request: phentermine  Last AEX:  11/12/16 Dr. Quincy Simmonds  Next AEX: 11/29/17  Last MMG (if hormonal medication request): 12/11/16 BIRADS1:neg  Refill authorized: 11/12/16 #[id number]. Today please advise.

## 2017-01-04 ENCOUNTER — Encounter: Payer: Self-pay | Admitting: Obstetrics and Gynecology

## 2017-01-04 ENCOUNTER — Ambulatory Visit: Payer: 59 | Admitting: Obstetrics and Gynecology

## 2017-01-04 ENCOUNTER — Ambulatory Visit (INDEPENDENT_AMBULATORY_CARE_PROVIDER_SITE_OTHER): Payer: 59 | Admitting: Obstetrics and Gynecology

## 2017-01-04 VITALS — BP 138/80 | HR 76 | Ht 64.0 in | Wt 201.4 lb

## 2017-01-04 DIAGNOSIS — M5010 Cervical disc disorder with radiculopathy, unspecified cervical region: Secondary | ICD-10-CM | POA: Diagnosis not present

## 2017-01-04 DIAGNOSIS — Z7689 Persons encountering health services in other specified circumstances: Secondary | ICD-10-CM | POA: Diagnosis not present

## 2017-01-04 NOTE — Progress Notes (Signed)
GYNECOLOGY  VISIT   HPI: 56 y.o.   Married  [place] American  female   [phone] with No LMP recorded. Patient has had a hysterectomy.   here for 4 week follow up.   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 Has appointment to see neurosurgeon, Dr. Christella Noa,  in 4 days.   Has headache, dizziness, and nausea.   Did loose weight with Phentermine.  GYNECOLOGIC HISTORY: No LMP recorded. Patient has had a hysterectomy. Contraception:  Tubal/hysterectomy Menopausal hormone therapy:  n/a Last mammogram: 12-11-16 Density C/Neg/BiRads1:TBC Last pap smear: 11-12-16 Neg,  10-14-13 Neg:Neg HR HPV, 10-05-11 LSIL(colpo:CIN I        OB History    Gravida Para Term Preterm AB Living   3 2 2   1 2    SAB TAB Ectopic Multiple Live Births           2         Patient Active Problem List   Diagnosis Date Noted  . Cervical disc disorder with radiculopathy of cervical region 12/11/2016  . Low vitamin B12 level 12/04/2016  . Vitamin D deficiency 12/04/2016  . Low TSH level 12/04/2016  . Scapular dyskinesis 10/03/2016  . Right shoulder pain 08/29/2016  . Viral gastroenteritis 08/08/2016  . Midepigastric pain 06/08/2016  . Sinusitis 01/11/2016  . URI (upper respiratory infection) 08/12/2015  . Hypersomnia 07/19/2015  . Lung nodule 07/19/2015  . Sleep apnea 05/27/2015  . Solitary pulmonary nodule 05/27/2015  . Hot flashes 05/27/2015  . GERD (gastroesophageal reflux disease) 05/13/2015  . Diabetes mellitus, type 2 ([place]) 04/12/2015  . Urine, incontinence, stress female 06/18/2014  . Depression with anxiety 01/22/2014  . VAIN I (vaginal intraepithelial neoplasia grade I)   . Hyperlipidemia 10/22/2013  . Fatigue   . Nausea with vomiting   . Migraine headache 10/04/2008    Past Medical History:  Diagnosis Date  . Anxiety   . Arthritis   . Brain tumor (benign) ([place]) 2018  . Chest pain 05/15/2015  . Depression   . Diabetes mellitus without complication ([place])   . GERD  (gastroesophageal reflux disease)   . H/O varicella   . History of measles, mumps, or rubella   . HSV-2 (herpes simplex virus 2) infection   . Hx of ovarian cyst   . Hyperlipidemia   . Migraine headache   . Sleep apnea    pt denies  . Sleep deprivation    loss of sleep  . VAIN I (vaginal intraepithelial neoplasia grade I) 2013   pap and confirmed by colposcopic biopsy    Past Surgical History:  Procedure Laterality Date  . ABDOMINAL HYSTERECTOMY    . CHOLECYSTECTOMY  11/13/10  . COLONOSCOPY  Multiple  . DILATION AND CURETTAGE OF UTERUS    . ESOPHAGOGASTRODUODENOSCOPY    . OOPHORECTOMY  2009   bilateral salpingo-oophorectomy.  . TUBAL LIGATION      Current Outpatient Prescriptions  Medication Sig Dispense Refill  . aspirin-acetaminophen-caffeine (EXCEDRIN MIGRAINE) 250-250-65 MG tablet Take 1 tablet by mouth daily as needed for headache or migraine. Reported on 05/27/2015    . estradiol (ESTRACE) 1 MG tablet Take 1 tablet (1 mg total) by mouth 2 (two) times daily. 180 tablet 3  . glucose blood (ACCU-CHEK AVIVA) test strip Use as instructed to check sugar one time daily 100 each 5  . meloxicam (MOBIC) 15 MG tablet Take 1 tablet (15 mg total) by mouth daily. Reported on 05/27/2015 30 tablet 0  . metFORMIN (GLUCOPHAGE-XR)  500 MG 24 hr tablet TAKE 1 TABLET BY MOUTH 2 TIMES DAILY WITH A MEAL. 180 tablet 1  . naproxen (EC NAPROSYN) 500 MG EC tablet Take 1 tablet (500 mg total) by mouth 2 (two) times daily as needed. 60 tablet 1  . ondansetron (ZOFRAN-ODT) 4 MG disintegrating tablet Take 1 tablet (4 mg total) by mouth every 8 (eight) hours as needed for nausea or vomiting. 12 tablet 0  . pantoprazole (PROTONIX) 40 MG tablet Take 1 tablet (40 mg total) by mouth 2 (two) times daily before a meal. (Patient taking differently: Take 40 mg by mouth daily. ) 180 tablet 3  . phentermine 37.5 MG capsule Take 1 capsule (37.5 mg total) by mouth every morning. 30 capsule 0  . Pitavastatin Calcium  (LIVALO) 2 MG TABS Take 1 tablet (2 mg total) by mouth daily. 90 tablet 3  . traMADol (ULTRAM) 50 MG tablet Take 1 tablet (50 mg total) by mouth every 6 (six) hours as needed. 20 tablet 0  . Vitamin D, Ergocalciferol, (DRISDOL) 50000 units CAPS capsule Take 1 capsule (50,000 Units total) by mouth every 7 (seven) days. 12 capsule 1   No current facility-administered medications for this visit.      ALLERGIES: Sulfa antibiotics  Family History  Problem Relation Age of Onset  . Hypertension Mother   . Hypotension Mother   . Anemia Mother        low iron  . Heart disease Sister   . Sickle cell trait Other   . Alcohol abuse Brother   . Alcohol abuse Brother   . Drug abuse Brother   . Colon cancer Neg Hx   . Esophageal cancer Neg Hx   . Stomach cancer Neg Hx     Social History   Social History  . Marital status: Married    Spouse name: N/A  . Number of children: N/A  . Years of education: N/A   Occupational History  . Not on file.   Social History Main Topics  . Smoking status: Former Smoker    Packs/day: 0.50    Types: Cigarettes  . Smokeless tobacco: Never Used  . Alcohol use No  . Drug use: No  . Sexual activity: Yes    Partners: Male    Birth control/ protection: Surgical     Comment: Hysterectomy   Other Topics Concern  . Not on file   Social History Narrative   Patient lives at home with her husband [name]). 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 Two children. Both boys    ROS:  Pertinent items are noted in HPI.  PHYSICAL EXAMINATION:    BP 138/80 (BP Location: Left Arm, Patient Position: Sitting, Cuff Size: Large)   Pulse 76   Ht 5\' 4"  (1.626 m)   Wt 201 lb 6.4 oz (91.4 kg)   BMI 34.57 kg/m     General appearance: alert, cooperative and appears stated age  ASSESSMENT  Weight management.  Status post Phentermine. New dx of subependymoma.  PLAN  I recommend weight  loss through diet and exercise.  I do not recommend any further Phentermine.  Up to Date literature search on subependymoma.  Information printed for the patient.  Emotional support given.  Follow up prn.   An After Visit Summary was printed and given to the patient.  __15____ minutes face to face time of which over 50% was spent in counseling.

## 2017-01-08 DIAGNOSIS — R030 Elevated blood-pressure reading, without diagnosis of hypertension: Secondary | ICD-10-CM | POA: Diagnosis not present

## 2017-01-08 DIAGNOSIS — D496 Neoplasm of unspecified behavior of brain: Secondary | ICD-10-CM | POA: Diagnosis not present

## 2017-01-08 DIAGNOSIS — Z6833 Body mass index (BMI) 33.0-33.9, adult: Secondary | ICD-10-CM | POA: Diagnosis not present

## 2017-01-11 DIAGNOSIS — M50822 Other cervical disc disorders at C5-C6 level: Secondary | ICD-10-CM | POA: Diagnosis not present

## 2017-01-11 DIAGNOSIS — G8929 Other chronic pain: Secondary | ICD-10-CM | POA: Diagnosis not present

## 2017-01-11 DIAGNOSIS — G894 Chronic pain syndrome: Secondary | ICD-10-CM | POA: Diagnosis not present

## 2017-01-11 DIAGNOSIS — M25511 Pain in right shoulder: Secondary | ICD-10-CM | POA: Diagnosis not present

## 2017-01-16 ENCOUNTER — Other Ambulatory Visit: Payer: Self-pay | Admitting: Obstetrics and Gynecology

## 2017-01-16 ENCOUNTER — Telehealth: Payer: Self-pay

## 2017-01-16 ENCOUNTER — Encounter: Payer: Self-pay | Admitting: Obstetrics and Gynecology

## 2017-01-16 MED ORDER — PHENTERMINE HCL 37.5 MG PO CAPS: 37.5000 mg | ORAL_CAPSULE | ORAL | 0 refills | Status: DC

## 2017-01-16 NOTE — Telephone Encounter (Signed)
Please see telephone call created with today's date.

## 2017-01-16 NOTE — Telephone Encounter (Signed)
Spoke with patient. Advised of message as seen below from [place]. Patient verbalizes understanding. 1 month recheck scheduled for 02/15/2017 at 10:30 am with Dr.Silva. Patient is agreeable to date and time. Encounter closed.

## 2017-01-16 NOTE — Telephone Encounter (Signed)
I refilled the Phentermine 37.5 mg for one month.  I already sent this to the pharmacy. She will need a recheck in one month.

## 2017-01-16 NOTE — Telephone Encounter (Signed)
Non-Urgent Medical Question  Message [id number]  From [place], MD Sent 01/16/2017 10:19 AM  Good morning, just wanted to let you know the visit with the neuro surgeon went well. He said at this time we will just monitor the condition and see me back in two years.   So with that being said , would you reconsider a refill for weight loss meds.   Thank you for your support   P.Heinbaugh   Responsible Party   Pool - [facility] Clinical Pool No one has taken responsibility for this message.  No actions have been taken on this message.   Routing to [place] for review and advise. Patient was previously on Phentermine.

## 2017-01-17 ENCOUNTER — Telehealth: Payer: Self-pay

## 2017-01-17 MED FILL — PHENTERMINE 37.5 MG TABLET: 37.5 | 30 days supply | Qty: 30 | Fill #0

## 2017-01-17 NOTE — Telephone Encounter (Signed)
Rx for Phentermine 37.5mg  faxed to [facility] [phone].

## 2017-01-18 DIAGNOSIS — M25511 Pain in right shoulder: Secondary | ICD-10-CM | POA: Diagnosis not present

## 2017-01-18 DIAGNOSIS — M25611 Stiffness of right shoulder, not elsewhere classified: Secondary | ICD-10-CM | POA: Diagnosis not present

## 2017-01-18 DIAGNOSIS — G8929 Other chronic pain: Secondary | ICD-10-CM | POA: Diagnosis not present

## 2017-01-18 DIAGNOSIS — M75111 Incomplete rotator cuff tear or rupture of right shoulder, not specified as traumatic: Secondary | ICD-10-CM | POA: Diagnosis not present

## 2017-01-23 DIAGNOSIS — Z6833 Body mass index (BMI) 33.0-33.9, adult: Secondary | ICD-10-CM | POA: Diagnosis not present

## 2017-01-23 DIAGNOSIS — R030 Elevated blood-pressure reading, without diagnosis of hypertension: Secondary | ICD-10-CM | POA: Diagnosis not present

## 2017-01-23 DIAGNOSIS — D496 Neoplasm of unspecified behavior of brain: Secondary | ICD-10-CM | POA: Diagnosis not present

## 2017-01-23 MED FILL — INDOMETHACIN 25 MG CAPSULE: 25 | 30 days supply | Qty: 90 | Fill #0

## 2017-02-01 ENCOUNTER — Ambulatory Visit: Payer: Self-pay | Admitting: Family Medicine

## 2017-02-15 ENCOUNTER — Ambulatory Visit: Payer: 59 | Admitting: Obstetrics and Gynecology

## 2017-02-18 ENCOUNTER — Other Ambulatory Visit: Payer: Self-pay

## 2017-02-18 NOTE — Patient Outreach (Signed)
[facility]) Care Management  02/18/2017  [name] 1960/07/30 [id number]   Case closed in [place].  Member is being followed by the [employer] program Member enrolled in an external program. [name] RN, [facility] Care Management Coordinator-Link to [place] Management [phone]

## 2017-02-21 DIAGNOSIS — M25511 Pain in right shoulder: Secondary | ICD-10-CM | POA: Diagnosis not present

## 2017-02-21 DIAGNOSIS — G8929 Other chronic pain: Secondary | ICD-10-CM | POA: Diagnosis not present

## 2017-03-01 ENCOUNTER — Ambulatory Visit: Payer: 59 | Admitting: Obstetrics and Gynecology

## 2017-03-13 ENCOUNTER — Telehealth: Payer: Self-pay | Admitting: Obstetrics & Gynecology

## 2017-03-13 NOTE — Telephone Encounter (Signed)
Patient canceled her upcoming appointment for phentermine check 03/15/17. Patient will call later to reschedule.

## 2017-03-15 ENCOUNTER — Ambulatory Visit: Payer: Self-pay | Admitting: Obstetrics & Gynecology

## 2017-03-26 ENCOUNTER — Other Ambulatory Visit: Payer: Self-pay | Admitting: Family Medicine

## 2017-03-26 ENCOUNTER — Telehealth: Payer: Self-pay | Admitting: Family Medicine

## 2017-03-26 DIAGNOSIS — E118 Type 2 diabetes mellitus with unspecified complications: Secondary | ICD-10-CM

## 2017-03-26 NOTE — Telephone Encounter (Signed)
Pt has appt 04-05-17 with [facility].  She needs a referral.

## 2017-03-26 NOTE — Telephone Encounter (Signed)
Done

## 2017-03-26 NOTE — Progress Notes (Signed)
She requested referral to Endo. Seeing them for DM2

## 2017-03-26 NOTE — Telephone Encounter (Signed)
Will forward to Dr. Gwendlyn Deutscher to place referral. Johnney Ou

## 2017-04-01 MED FILL — LIVALO 2 MG TABLET: 2 | 90 days supply | Qty: 90 | Fill #1

## 2017-04-01 MED FILL — ACCU-CHEK FASTCLIX LANCETS: 90 days supply | Qty: 102 | Fill #1

## 2017-04-01 MED FILL — ACCU-CHEK GUIDE TEST STRIP: 90 days supply | Qty: 100 | Fill #2

## 2017-04-01 MED FILL — METFORMIN HCL ER 500 MG TAB: 500 | 90 days supply | Qty: 180 | Fill #1

## 2017-04-02 ENCOUNTER — Encounter: Payer: Self-pay | Admitting: Family Medicine

## 2017-04-02 ENCOUNTER — Ambulatory Visit (INDEPENDENT_AMBULATORY_CARE_PROVIDER_SITE_OTHER): Payer: No Typology Code available for payment source | Admitting: Family Medicine

## 2017-04-02 ENCOUNTER — Other Ambulatory Visit: Payer: Self-pay

## 2017-04-02 DIAGNOSIS — M25511 Pain in right shoulder: Secondary | ICD-10-CM | POA: Diagnosis not present

## 2017-04-02 DIAGNOSIS — E559 Vitamin D deficiency, unspecified: Secondary | ICD-10-CM | POA: Diagnosis not present

## 2017-04-02 DIAGNOSIS — R911 Solitary pulmonary nodule: Secondary | ICD-10-CM | POA: Diagnosis not present

## 2017-04-02 DIAGNOSIS — L309 Dermatitis, unspecified: Secondary | ICD-10-CM

## 2017-04-02 DIAGNOSIS — G8929 Other chronic pain: Secondary | ICD-10-CM | POA: Diagnosis not present

## 2017-04-02 MED ORDER — BETAMETHASONE VALERATE 0.1 % EX CREA: TOPICAL_CREAM | Freq: Two times a day (BID) | CUTANEOUS | 0 refills | Status: DC

## 2017-04-02 MED FILL — BETAMETHASONE VA 0.1% CREAM: 0.1 | 30 days supply | Qty: 45 | Fill #0

## 2017-04-02 NOTE — Patient Instructions (Signed)
Vitamin D Deficiency Vitamin D deficiency is when your body does not have enough vitamin D. Vitamin D is important because:  It helps your body use other minerals that your body needs.  It helps keep your bones strong and healthy.  It may help to prevent some diseases.  It helps your heart and other muscles work well.  You can get vitamin D by:  Eating foods with vitamin D in them.  Drinking or eating milk or other foods that have had vitamin D added to them.  Taking a vitamin D supplement.  Being in the sun.  Not getting enough vitamin D can make your bones become soft. It can also cause other health problems. Follow these instructions at home:  Take medicines and supplements only as told by your doctor.  Eat foods that have vitamin D. These include: ? Dairy products, cereals, or juices with added vitamin D. Check the label for vitamin D. ? Fatty fish like salmon or trout. ? Eggs. ? Oysters.  Do not use tanning beds.  Stay at a healthy weight. Lose weight, if needed.  Keep all follow-up visits as told by your doctor. This is important. Contact a doctor if:  Your symptoms do not go away.  You feel sick to your stomach (nauseous).  Youthrow up (vomit).  You poop less often than usual or you have trouble pooping (constipation). This information is not intended to replace advice given to you by your health care provider. Make sure you discuss any questions you have with your health care provider. Document Released: 02/22/2011 Document Revised: 08/11/2015 Document Reviewed: 07/21/2014 Elsevier Interactive Patient Education  2018 Elsevier Inc.  

## 2017-04-02 NOTE — Assessment & Plan Note (Signed)
Asymptomatic. Not currently a smoker. Repeat CT chest as recommended in 2018 was benign. May stop monitoring unless patient is concern. Option given. She prefers to stop monitoring for now.  CLINICAL DATA:  57 year old female with left posterior chest pain and shortness of breath  EXAM: CT CHEST WITHOUT CONTRAST  TECHNIQUE: Multidetector CT imaging of the chest was performed following the standard protocol without IV contrast.  COMPARISON:  Cardiac CT 05/17/2015 ; prior CT scan abdomen and pelvis 02/03/2016  FINDINGS: Cardiovascular: Limited evaluation in the absence of intravenous contrast. The heart is normal in size. No evidence of aortic aneurysm. Small pericardial effusion which remains unchanged compared to May 17, 2015.  Mediastinum/Nodes: Unremarkable CT appearance of the thyroid gland. No suspicious mediastinal or hilar adenopathy. No soft tissue mediastinal mass. The thoracic esophagus is unremarkable.  Lungs/Pleura: Mild biapical pleuroparenchymal scarring. Stable 4 mm right middle lobe pulmonary nodule. Calcified subpleural granuloma measuring 2 mm again noted in the periphery of the left lower lobe. Otherwise, the lungs are clear.  Upper Abdomen: Stable low-attenuation hepatic lesions which most likely represent simple cysts. The gallbladder is surgically absent.  Musculoskeletal: No acute fracture or aggressive appearing lytic or blastic osseous lesion.  IMPRESSION: 1. No acute cardiopulmonary process. 2. Stable right middle lobe 4 mm pulmonary nodule. One year stability is consistent with benignity.   Electronically Signed   By: Jacqulynn Cadet M.D.   On: 05/24/2016 07:54

## 2017-04-02 NOTE — Assessment & Plan Note (Signed)
I recommended recheck today. She stated since she sees her endocrinologist in the next few 2 weeks she will get it done with other lab check. May use OTC Vit D for now and Vit D rich diet.

## 2017-04-02 NOTE — Assessment & Plan Note (Signed)
Likely contact dermatitis of her lower back. Topical steroid cream prescribed. Consider biopsy if no improvement with treatment.

## 2017-04-02 NOTE — Assessment & Plan Note (Signed)
Rotator cuff disease vs Cervical radiculopathy. No neurologic deficit. Continue current pain regimen. Referral to orthopedic completed. PT recommended but she declined at this time. Continue home exercise.

## 2017-04-02 NOTE — Progress Notes (Signed)
Subjective:     Patient ID: [name], female   DOB: 09-22-1960, 57 y.o.   MRN: [id number]  HPI Shoulder pain:Here for follow-up. No change in her symptoms, feels a bit better. [name] is needing Orthopedic referral for this year. Dr. Nelva Bush gave injection few weeks ago which helped some.Did not go for her PT. Pulm nodules:Denies pulm concern. Hypovitamin D: Off meds, here for follow-up. Rash: C/O itchy, dark,dry rash on her lumbar area for which [name] uses neosporin without any improvement. Feels like the rash is spreading. HM: here for routine health care.  Current Outpatient Medications on File Prior to Visit  Medication Sig Dispense Refill  . estradiol (ESTRACE) 1 MG tablet Take 1 tablet (1 mg total) by mouth 2 (two) times daily. 180 tablet 3  . metFORMIN (GLUCOPHAGE-XR) 500 MG 24 hr tablet TAKE 1 TABLET BY MOUTH 2 TIMES DAILY WITH A MEAL. (Patient taking differently: TAKE 1 TABLET BY MOUTH DAILY WITH A MEAL.) 180 tablet 1  . naproxen (EC NAPROSYN) 500 MG EC tablet Take 1 tablet (500 mg total) by mouth 2 (two) times daily as needed. 60 tablet 1  . phentermine 37.5 MG capsule Take 1 capsule (37.5 mg total) by mouth every morning. 30 capsule 0  . Pitavastatin Calcium (LIVALO) 2 MG TABS Take 1 tablet (2 mg total) by mouth daily. 90 tablet 3  . Vitamin D, Ergocalciferol, (DRISDOL) 50000 units CAPS capsule Take 1 capsule (50,000 Units total) by mouth every 7 (seven) days. 12 capsule 1  . aspirin-acetaminophen-caffeine (EXCEDRIN MIGRAINE) 250-250-65 MG tablet Take 1 tablet by mouth daily as needed for headache or migraine. Reported on 05/27/2015    . glucose blood (ACCU-CHEK AVIVA) test strip Use as instructed to check sugar one time daily 100 each 5  . meloxicam (MOBIC) 15 MG tablet Take 1 tablet (15 mg total) by mouth daily. Reported on 05/27/2015 (Patient not taking: Reported on 04/02/2017) 30 tablet 0  . pantoprazole (PROTONIX) 40 MG tablet Take 1 tablet (40 mg total) by mouth 2 (two) times daily  before a meal. (Patient not taking: Reported on 04/02/2017) 180 tablet 3  . traMADol (ULTRAM) 50 MG tablet Take 1 tablet (50 mg total) by mouth every 6 (six) hours as needed. (Patient not taking: Reported on 04/02/2017) 20 tablet 0   No current facility-administered medications on file prior to visit.    Past Medical History:  Diagnosis Date  . Anxiety   . Arthritis   . Brain tumor (benign) ([place]) 2018  . Chest pain 05/15/2015  . Depression   . Diabetes mellitus without complication ([place])   . GERD (gastroesophageal reflux disease)   . H/O varicella   . History of measles, mumps, or rubella   . HSV-2 (herpes simplex virus 2) infection   . Hx of ovarian cyst   . Hyperlipidemia   . Migraine headache   . Sleep apnea    pt denies  . Sleep deprivation    loss of sleep  . VAIN I (vaginal intraepithelial neoplasia grade I) 2013   pap and confirmed by colposcopic biopsy     Review of Systems  Respiratory: Negative.   Cardiovascular: Negative.   Gastrointestinal: Negative.   Musculoskeletal: Positive for arthralgias.       Right shoulder pain  Skin: Positive for rash.  All other systems reviewed and are negative.      Objective:   Physical Exam  Constitutional: [name] is oriented to person, place, and time. [name] appears well-developed. No  distress.  Cardiovascular: Normal rate, regular rhythm and normal heart sounds.  No murmur heard. Pulmonary/Chest: Effort normal and breath sounds normal. No respiratory distress. [name] has no wheezes.  Abdominal: Soft. Bowel sounds are normal. [name] exhibits no distension and no mass. There is no tenderness.  Musculoskeletal:       Right shoulder: [name] exhibits decreased range of motion and tenderness.  Neurological: [name] is alert and oriented to person, place, and time. No cranial nerve deficit.  Skin:  Dry, hyperpigmented patch on her lower back, midline. Measures approximately 4 cm by 5 cm  Nursing note and vitals reviewed.      Assessment:      Right shoulder pain Pulm nodule Vit D deficiency Dermatitis Health maintenance    Plan:     Check problem list.  Note: [name] asked for refill of her weight loss meds which [name] gets from her OB/GYN. I recommend that [name] continue to get refill of this med from her OB/GNY since it is something I don't prescribe often due to side effects. Diet and exercise for weight loss discussed. [name] agreed with plan.  Tdap offered. [name] will return in the future for this.

## 2017-04-05 ENCOUNTER — Ambulatory Visit: Payer: No Typology Code available for payment source | Admitting: Internal Medicine

## 2017-04-05 ENCOUNTER — Encounter: Payer: Self-pay | Admitting: Internal Medicine

## 2017-04-05 VITALS — BP 138/80 | HR 105 | Ht 64.0 in | Wt 197.6 lb

## 2017-04-05 DIAGNOSIS — E785 Hyperlipidemia, unspecified: Secondary | ICD-10-CM

## 2017-04-05 DIAGNOSIS — E538 Deficiency of other specified B group vitamins: Secondary | ICD-10-CM

## 2017-04-05 DIAGNOSIS — E559 Vitamin D deficiency, unspecified: Secondary | ICD-10-CM

## 2017-04-05 DIAGNOSIS — R7989 Other specified abnormal findings of blood chemistry: Secondary | ICD-10-CM

## 2017-04-05 DIAGNOSIS — E119 Type 2 diabetes mellitus without complications: Secondary | ICD-10-CM | POA: Diagnosis not present

## 2017-04-05 LAB — POCT GLYCOSYLATED HEMOGLOBIN (HGB A1C): Hemoglobin A1C: 6

## 2017-04-05 NOTE — Patient Instructions (Signed)
Please continue Ergocalciferol 50,000 units weekly.  Please start B12 5000 units daily.  Please come back for labs in 2 mo, fasting. at the lab.  Please continue: - Metformin 500 mg once a day with dinner.  Please return in 4 months with your sugar log.

## 2017-04-05 NOTE — Addendum Note (Signed)
Addended by: Drucilla Schmidt on: 04/05/2017 11:55 AM   Modules accepted: Orders

## 2017-04-05 NOTE — Progress Notes (Signed)
Patient ID: [name], female   DOB: 12-Jan-1961, 57 y.o.   MRN: [id number]  HPI: [name] is a 57 y.o.-year-old female, returning for f/u for DM2; prediabetes dx early 2000s, controlled, without long term complications, hyperlipidemia, subclinical thyrotoxicosis. Also, vitamin D deficiency and low vitamin B12.Last visit 4 mo ago.  She had a shoulder steroid inj. Since last visit.  Last hemoglobin A1c was: Lab Results  Component Value Date   HGBA1C 6.2 12/04/2016   HGBA1C 6.2 08/03/2016   HGBA1C 5.9 04/06/2016  She had several steroid inj in the past- last in 04/2014.  She is on: - Metformin ER 500 mg 2x a day >> we had to decrease to once a day 2/2 GI discomfort >> currently on 500 mg with dinner. She was previously on: - Tradjenta 5 mg in am, before b'fast  - Invokana 100 mg in am - started 03/2015 >> she had CP, HA, lightheadedness - regular metformin >> diarrhea/nausea with regular metformin.  Pt checks sugars 0 to once a day: - am: 95-105 >> 80-90s >> 95-98 >> 95-103 >> 77, 85-95 - 2h after b'fast: 120-145 >> n/c >> 102 >> n/c >> 90-125 - lunch: 95-100 >> 95-100 >> 90s >> n/c - 2h after lunch:120-145 >> 95-100 >> n/c - before dinner (snacking):80-100 >> n/c - 2h after dinner:  n/c >> 96-100s, 135 x1 >> up to 120 >> 120-127 - bedtime: 85-98 >> 85-100 >> n/c Lowest sugar was 95 >> 77; unclear if she has hypoglycemia awareness Highest:  120 >> 127.  Meter: AccuChek >> [employer]: - b'fast: oatmeal + cinnamon + raisons; egg + toast + bacon - lunch: grilled chicken salad or sandwich + chips - snack: fruit, crackers + cheese - dinner: chicken, green beans, potato  She works in the [employer].  -  no CKD, last BUN/creatinine:  Lab Results  Component Value Date   BUN 10 05/15/2016   CREATININE 0.90 12/24/2016   - last eye exam was in 05/19/2016: No DR -She denies numbness and tingling in her feet  HL: Reviewed 3 lipid sets: -  Lipids improved on Livalo 2 mg daily: Lab Results  Component Value Date   CHOL 167 04/19/2016   CHOL 179 04/26/2015   CHOL 266 (H) 02/03/2015   Lab Results  Component Value Date   HDL 44.50 04/19/2016   HDL 42.60 04/26/2015   HDL 44.40 02/03/2015   Lab Results  Component Value Date   LDLCALC 102 (H) 04/19/2016   LDLCALC 123 (H) 04/26/2015   LDLCALC 196 (H) 02/03/2015   Lab Results  Component Value Date   TRIG 101.0 04/19/2016   TRIG 66.0 04/26/2015   TRIG 128.0 02/03/2015   Lab Results  Component Value Date   CHOLHDL 4 04/19/2016   CHOLHDL 4 04/26/2015   CHOLHDL 6 02/03/2015   Normal LFTs: Lab Results  Component Value Date   ALT 16 05/15/2016   AST 20 05/15/2016   ALKPHOS 79 05/15/2016   BILITOT <0.1 (L) 05/15/2016   In spring/2018, as she was complaining of fatigue, we checked vitamin levels and vitamin B12 and D were low: Lab Results  Component Value Date   VD25OH 17.98 (L) 08/03/2016   VD25OH 9 (L) 05/13/2015   Lab Results  Component Value Date   VITAMINB12 315 08/03/2016   VITAMINB12 428 05/13/2015   VITAMINB12 496 01/22/2014   I advised her to start:  Vitamin D 4000 units daily >> started but  stopped  Vitamin B12 5000 units daily >> started but stopped   However, she wanted to limit pill burden and preferred to start:  Vitamin D (ergocalciferol) 50,000 units weekly - takes consistently  Vitamin B-12 IM injections monthly - but last in 11/2016  Subclinical thyrotoxicosis  - She had to slightly low TSH levels with normal free thyroid hormones at the beginning of last year, but TFTs normalized at last check 4 months ago: Lab Results  Component Value Date   TSH 0.97 12/04/2016   TSH 0.34 (L) 08/03/2016   TSH 0.430 (L) 06/08/2016   TSH 0.77 05/13/2015   TSH 0.619 01/22/2014   TSH 0.730 10/14/2013   TSH 0.492 09/01/2012   TSH 0.745 10/05/2011   Lab Results  Component Value Date   FREET4 0.77 12/04/2016   FREET4 0.79 08/03/2016   T3FREE  3.4 12/04/2016   T3FREE 3.6 08/03/2016   She has dysphagia >> had esophageal stretching.  ROS: Constitutional:+ weight loss, no fatigue, no subjective hyperthermia, no subjective hypothermia Eyes: no blurry vision, no xerophthalmia ENT: no sore throat, no nodules palpated in throat, no dysphagia, no odynophagia, no hoarseness Cardiovascular: no CP/no SOB/no palpitations/no leg swelling Respiratory: no cough/no SOB/no wheezing Gastrointestinal: no N/no V/no D/no C/no acid reflux Musculoskeletal: no muscle aches/no joint aches Skin: no rashes, no hair loss Neurological: no tremors/no numbness/no tingling/no dizziness  I reviewed pt's medications, allergies, PMH, social hx, family hx, and changes were documented in the history of present illness. Otherwise, unchanged from my initial visit note.  PE: BP 138/80   Pulse (!) 105   Ht 5\' 4"  (1.626 m)   Wt 197 lb 9.6 oz (89.6 kg)   SpO2 99%   BMI 33.92 kg/m  Body mass index is 33.92 kg/m. Wt Readings from Last 3 Encounters:  04/05/17 197 lb 9.6 oz (89.6 kg)  04/02/17 196 lb (88.9 kg)  01/04/17 201 lb 6.4 oz (91.4 kg)   Constitutional: overweight, in NAD Eyes: PERRLA, EOMI, no exophthalmos ENT: moist mucous membranes, no thyromegaly, no cervical lymphadenopathy Cardiovascular: tachycardia, RR, No MRG Respiratory: CTA B Gastrointestinal: abdomen soft, NT, ND, BS+ Musculoskeletal: no deformities, strength intact in all 4 Skin: moist, warm, no rashes Neurological: no tremor with outstretched hands, DTR normal in all 4  ASSESSMENT: 1. DM2, non-insulin dependent, without complications, controlled  2. HL  3. Low TSH  4.  Low vitamin D and B12  PLAN:  1. Patient with controlled type 2 diabetes, on low-dose metformin no side effects.  - She is now doing the Quillian Quince fast and has seen improvement in her blood sugars - no change in regimen needed - I suggested to: Patient Instructions  Please continue Ergocalciferol 50,000 units  weekly.  Please start B12 5000 units daily.  Please come back for labs in 2 mo, fasting. at the lab.  Please continue: - Metformin 500 mg once a day with dinner.  Please return in 4 months with your sugar log.   - today, HbA1c is 6% (better) - continue checking sugars at different times of the day - check 1x a day, rotating checks - advised for yearly eye exams >> she is UTD - check CMP and ACR in 2 mo - Return to clinic in 4 mo with sugar log   2. HL - Reviewed lipid panel from 04/2016: LDL was improved - Continue Livalo - no side effects - She is due for another lipid panel >> will check in 2 mo,  fasting - Her  improvement in diet will also help  3. Low TSH - She has a history of subclinical thyrotoxicosis, which we managed expectantly - Reviewed TFTs obtained at last visit and these were all normal - We will repeat these in 2 mo  4. Low vitamin D and B12 - At last visit, we started monthly B12 injections and vitamin D 50,000 units weekly, per her request, since she did not want to use daily supplements. However, she did not come for B12 inj >>  Will start 5000cg po B12 daily. Continue Ergocalciferol weekly - plan to repeat levels in 2 mo   Philemon Kingdom, MD PhD [facility]

## 2017-04-12 DIAGNOSIS — M5412 Radiculopathy, cervical region: Secondary | ICD-10-CM | POA: Insufficient documentation

## 2017-04-17 ENCOUNTER — Telehealth: Payer: Self-pay | Admitting: Internal Medicine

## 2017-04-17 NOTE — Telephone Encounter (Signed)
Pt is needing labs done. The orders are in and we were going to set up appt for her to come in but  Pt would like to know if this is a fasting lab. And if she will need to come in a few days later for appt with dr before she will schedule.  Pt mentioned that she was wanting something in April. And would like to make sure that is okay to wait that long.   Please advise Thanks.[name]

## 2017-04-17 NOTE — Telephone Encounter (Signed)
Appointment made for 07/26/17 and it is fasting blood work

## 2017-05-03 ENCOUNTER — Ambulatory Visit: Payer: Self-pay | Admitting: Obstetrics and Gynecology

## 2017-05-07 ENCOUNTER — Telehealth: Payer: Self-pay | Admitting: Family Medicine

## 2017-05-07 DIAGNOSIS — Z7689 Persons encountering health services in other specified circumstances: Secondary | ICD-10-CM

## 2017-05-07 NOTE — Telephone Encounter (Signed)
Pt has an appointment for this Friday with her GYN doctor. [facility], She needs a referral placed so that she can go there. jw

## 2017-05-08 ENCOUNTER — Telehealth: Payer: Self-pay | Admitting: Obstetrics and Gynecology

## 2017-05-08 NOTE — Telephone Encounter (Signed)
Patient canceled her phentermine check appointment 05/10/17. Patient to call later to rescheduled.

## 2017-05-08 NOTE — Telephone Encounter (Signed)
Thank you for the update.  Encounter closed. 

## 2017-05-09 NOTE — Telephone Encounter (Signed)
Referral entered Waunita Sandstrom J Ladarian Bonczek, MD  

## 2017-05-10 ENCOUNTER — Ambulatory Visit: Payer: Self-pay | Admitting: Obstetrics and Gynecology

## 2017-05-24 LAB — HM DIABETES EYE EXAM

## 2017-06-04 ENCOUNTER — Encounter: Payer: Self-pay | Admitting: Family Medicine

## 2017-06-20 ENCOUNTER — Encounter: Payer: Self-pay | Admitting: Family Medicine

## 2017-06-20 ENCOUNTER — Other Ambulatory Visit: Payer: Self-pay | Admitting: Family Medicine

## 2017-06-20 ENCOUNTER — Ambulatory Visit (INDEPENDENT_AMBULATORY_CARE_PROVIDER_SITE_OTHER): Payer: No Typology Code available for payment source | Admitting: Family Medicine

## 2017-06-20 ENCOUNTER — Other Ambulatory Visit: Payer: Self-pay

## 2017-06-20 VITALS — BP 108/72 | HR 96 | Temp 98.6°F | Ht 64.0 in | Wt 197.4 lb

## 2017-06-20 DIAGNOSIS — R6889 Other general symptoms and signs: Secondary | ICD-10-CM | POA: Diagnosis not present

## 2017-06-20 LAB — INFLUENZA A AND B
Influenza A Ag, EIA: NEGATIVE
Influenza B Ag, EIA: NEGATIVE

## 2017-06-20 LAB — PLEASE NOTE:

## 2017-06-20 MED ORDER — OSELTAMIVIR PHOSPHATE 75 MG PO CAPS: 75.0000 mg | ORAL_CAPSULE | Freq: Two times a day (BID) | ORAL | 0 refills | Status: AC

## 2017-06-20 MED FILL — OSELTAMIVIR PHOSPHATE 75 MG: 75 | 5 days supply | Qty: 10 | Fill #0

## 2017-06-20 NOTE — Assessment & Plan Note (Signed)
Flu like symptoms that started on Tuesday, within 48h of onset. Treat empirically with tamiflu 75mg  BID for 5 days. Flu swab obtained today. Patient given return precautions.

## 2017-06-20 NOTE — Progress Notes (Signed)
    Subjective:  [name] is a 57 y.o. female who presents to the [facility] today with a chief complaint of flu like symptoms.   HPI:  Started on Tuesday. Been having body aches,  Fevers T max at home 101.24F, chills. Feels awful and tired.  Maybe had sick positive contacts. Her father died and many people came to the funeral to pay respects. Also has headaches, runny nose and stuffy nose, nonproductive cough. No nausea or vomiting.   ROS: Per HPI  Objective:  Physical Exam: BP 108/72   Pulse 96   Temp 98.6 F (37 C) (Oral)   Ht 5\' 4"  (1.626 m)   Wt 197 lb 6.4 oz (89.5 kg)   SpO2 96%   BMI 33.88 kg/m   Gen: NAD, nontoxic appearing HEENT: [place], AT. TMs pearly with good light reflex. Nasal mucosa erythematous and boggy. Oropharynx mildly erythematous without exudates. CV: RRR with no murmurs appreciated Pulm: NWOB, CTAB with no crackles, wheezes, or rhonchi GI: Normal bowel sounds present. Soft, Nontender, Nondistended. MSK: no edema, cyanosis, or clubbing noted Skin: warm, dry Neuro: grossly normal, moves all extremities Psych: Normal affect and thought content   Assessment/Plan:  Flu-like symptoms Flu like symptoms that started on Tuesday, within 48h of onset. Treat empirically with tamiflu 75mg  BID for 5 days. Flu swab obtained today. Patient given return precautions.    Bufford Lope, DO PGY-2, [facility] 06/20/2017 11:48 AM

## 2017-06-20 NOTE — Patient Instructions (Signed)
We will check a flu swab today.  Start tamiflu twice a day for a total of 5 days.   Influenza, Adult Influenza, more commonly known as "the flu," is a viral infection that primarily affects the respiratory tract. The respiratory tract includes organs that help you breathe, such as the lungs, nose, and throat. The flu causes many common cold symptoms, as well as a high fever and body aches. The flu spreads easily from person to person (is contagious). Getting a flu shot (influenza vaccination) every year is the best way to prevent influenza. What are the causes? Influenza is caused by a virus. You can catch the virus by:  Breathing in droplets from an infected person's cough or sneeze.  Touching something that was recently contaminated with the virus and then touching your mouth, nose, or eyes.  What increases the risk? The following factors may make you more likely to get the flu:  Not cleaning your hands frequently with soap and water or alcohol-based hand sanitizer.  Having close contact with many people during cold and flu season.  Touching your mouth, eyes, or nose without washing or sanitizing your hands first.  Not drinking enough fluids or not eating a healthy diet.  Not getting enough sleep or exercise.  Being under a high amount of stress.  Not getting a yearly (annual) flu shot.  You may be at a higher risk of complications from the flu, such as a severe lung infection (pneumonia), if you:  Are over the age of 21.  Are pregnant.  Have a weakened disease-fighting system (immune system). You may have a weakened immune system if you: ? Have HIV or AIDS. ? Are undergoing chemotherapy. ? Aretaking medicines that reduce the activity of (suppress) the immune system.  Have a long-term (chronic) illness, such as heart disease, kidney disease, diabetes, or lung disease.  Have a liver disorder.  Are obese.  Have anemia.  What are the signs or symptoms? Symptoms of  this condition typically last 4-10 days and may include:  Fever.  Chills.  Headache, body aches, or muscle aches.  Sore throat.  Cough.  Runny or congested nose.  Chest discomfort and cough.  Poor appetite.  Weakness or tiredness (fatigue).  Dizziness.  Nausea or vomiting.  How is this diagnosed? This condition may be diagnosed based on your medical history and a physical exam. Your health care provider may do a nose or throat swab test to confirm the diagnosis. How is this treated? If influenza is detected early, you can be treated with antiviral medicine that can reduce the length of your illness and the severity of your symptoms. This medicine may be given by mouth (orally) or through an IV tube that is inserted in one of your veins. The goal of treatment is to relieve symptoms by taking care of yourself at home. This may include taking over-the-counter medicines, drinking plenty of fluids, and adding humidity to the air in your home. In some cases, influenza goes away on its own. Severe influenza or complications from influenza may be treated in a hospital. Follow these instructions at home:  Take over-the-counter and prescription medicines only as told by your health care provider.  Use a cool mist humidifier to add humidity to the air in your home. This can make breathing easier.  Rest as needed.  Drink enough fluid to keep your urine clear or pale yellow.  Cover your mouth and nose when you cough or sneeze.  Wash your hands  with soap and water often, especially after you cough or sneeze. If soap and water are not available, use hand sanitizer.  Stay home from work or school as told by your health care provider. Unless you are visiting your health care provider, try to avoid leaving home until your fever has been gone for 24 hours without the use of medicine.  Keep all follow-up visits as told by your health care provider. This is important. How is this  prevented?  Getting an annual flu shot is the best way to avoid getting the flu. You may get the flu shot in late summer, fall, or winter. Ask your health care provider when you should get your flu shot.  Wash your hands often or use hand sanitizer often.  Avoid contact with people who are sick during cold and flu season.  Eat a healthy diet, drink plenty of fluids, get enough sleep, and exercise regularly. Contact a health care provider if:  You develop new symptoms.  You have: ? Chest pain. ? Diarrhea. ? A fever.  Your cough gets worse.  You produce more mucus.  You feel nauseous or you vomit. Get help right away if:  You develop shortness of breath or difficulty breathing.  Your skin or nails turn a bluish color.  You have severe pain or stiffness in your neck.  You develop a sudden headache or sudden pain in your face or ear.  You cannot stop vomiting. This information is not intended to replace advice given to you by your health care provider. Make sure you discuss any questions you have with your health care provider. Document Released: 03/02/2000 Document Revised: 08/11/2015 Document Reviewed: 12/28/2014 Elsevier Interactive Patient Education  2017 [employer].

## 2017-06-21 ENCOUNTER — Telehealth: Payer: Self-pay

## 2017-06-21 NOTE — Telephone Encounter (Signed)
Pt called for flu results. Advised they have not resulted yet, will call her when we receive her results. Wallace Cullens, RN

## 2017-06-24 ENCOUNTER — Ambulatory Visit (INDEPENDENT_AMBULATORY_CARE_PROVIDER_SITE_OTHER): Payer: No Typology Code available for payment source | Admitting: Internal Medicine

## 2017-06-24 ENCOUNTER — Telehealth: Payer: Self-pay | Admitting: Family Medicine

## 2017-06-24 ENCOUNTER — Ambulatory Visit
Admission: RE | Admit: 2017-06-24 | Discharge: 2017-06-24 | Disposition: A | Discharge status: Home / Self Care | Payer: No Typology Code available for payment source | Source: Ambulatory Visit | Attending: Family Medicine | Admitting: Family Medicine

## 2017-06-24 ENCOUNTER — Encounter: Payer: Self-pay | Admitting: Internal Medicine

## 2017-06-24 ENCOUNTER — Other Ambulatory Visit: Payer: Self-pay

## 2017-06-24 VITALS — BP 104/72 | HR 89 | Temp 98.3°F | Ht 64.0 in | Wt 198.0 lb

## 2017-06-24 DIAGNOSIS — R05 Cough: Secondary | ICD-10-CM

## 2017-06-24 DIAGNOSIS — R059 Cough, unspecified: Secondary | ICD-10-CM

## 2017-06-24 MED ORDER — HYDROCODONE-HOMATROPINE 5-1.5 MG/5ML PO SYRP: 5.0000 mL | ORAL_SOLUTION | Freq: Four times a day (QID) | ORAL | 0 refills | Status: DC | PRN

## 2017-06-24 MED ORDER — BENZONATATE 100 MG PO CAPS: 100.0000 mg | ORAL_CAPSULE | Freq: Three times a day (TID) | ORAL | 0 refills | Status: DC | PRN

## 2017-06-24 MED FILL — HYDROCODONE-HOMATROPINE SYR: 5-1.5 | 6 days supply | Qty: 120 | Fill #0

## 2017-06-24 NOTE — Telephone Encounter (Signed)
Results are negative. I'm working on getting the result to drop in [place].

## 2017-06-24 NOTE — Progress Notes (Signed)
   Subjective:    [name] - 57 y.o. female MRN [id number]  Date of birth: January 13, 1961  HPI  [name] is here for follow up of cough. Was seen on 4/4 for flu like symptoms and given Rx for Tamiflu. Per telephone note from [facility], patient was Influenza A and B negative; results have yet to be uploaded to Epic. She reports that she has continued to have significant dry cough. Also reports fevers until 2 days ago with Tmax of 101.53F. Endorses rhinorrhea, nasal congestion, fatigue, and chills. No hemoptysis or chest pain. Multiple sick contacts. Has been taking Tylenol.    -  reports that she has quit smoking. Her smoking use included cigarettes. She smoked 0.50 packs per day. She has never used smokeless tobacco. - Review of Systems: Per HPI. - Past Medical History: Patient Active Problem List   Diagnosis Date Noted  . Flu-like symptoms 06/20/2017  . Dermatitis 04/02/2017  . Cervical disc disorder with radiculopathy of cervical region 12/11/2016  . Low vitamin B12 level 12/04/2016  . Vitamin D deficiency 12/04/2016  . Low TSH level 12/04/2016  . Scapular dyskinesis 10/03/2016  . Right shoulder pain 08/29/2016  . Hypersomnia 07/19/2015  . Sleep apnea 05/27/2015  . Solitary pulmonary nodule 05/27/2015  . Hot flashes 05/27/2015  . GERD (gastroesophageal reflux disease) 05/13/2015  . Diabetes mellitus, type 2 ([place]) 04/12/2015  . Urine, incontinence, stress female 06/18/2014  . Depression with anxiety 01/22/2014  . VAIN I (vaginal intraepithelial neoplasia grade I)   . Hyperlipidemia 10/22/2013  . Migraine headache 10/04/2008   - Medications: reviewed and updated   Objective:   Physical Exam BP 104/72   Pulse 89   Temp 98.3 F (36.8 C) (Oral)   Ht 5\' 4"  (1.626 m)   Wt 198 lb (89.8 kg)   SpO2 95%   BMI 33.99 kg/m  Gen: NAD, alert, cooperative with exam, non-toxic  HEENT: NCAT, PERRL, clear conjunctiva, oropharynx mildly erythematous without tonsillar  hypertrophy or exudates, TMs normal bilaterally CV: RRR, good S1/S2, no murmur, no edema, capillary refill brisk   Resp: normal WOB, faint coarse breath sounds in the bases bilaterally L >R otherwise clear without wheezes  Neuro: no gross deficits.  Psych: good insight, alert and oriented    Assessment & Plan:   1. Cough Recent negative influenza testing. Suspect symptoms most likely related to viral bronchitis. However, given cough with fever and questionable lung exam findings will obtain CXR to evaluate for pneumonia. Well's score of 0 so low suspicion for PE and symptoms more consistent with infectious etiology. Have prescribed supportive therapies for presumed bronchitis. Will treat with antibiotics if CXR concerning for pneumonia. Strict return precautions discussed.  - DG Chest 2 View; Future - benzonatate (TESSALON) 100 MG capsule; Take 1 capsule (100 mg total) by mouth 3 (three) times daily as needed for cough.  Dispense: 30 capsule; Refill: 0 - HYDROcodone-homatropine (HYCODAN) 5-1.5 MG/5ML syrup; Take 5 mLs by mouth every 6 (six) hours as needed for cough.  Dispense: 120 mL; Refill: 0   Phill Myron, D.O. 06/24/2017, 1:56 PM PGY-3, [place]

## 2017-06-24 NOTE — Telephone Encounter (Signed)
Pt would like to know her results from her flu test. Pt said she was told by her dr on 4/4 she would be contacted within the day about the results and she still has not heard back. Pt also needs a dr's note from that appt as well. Please advise

## 2017-06-24 NOTE — Telephone Encounter (Signed)
Will forward to lab to check on this. Jazmin Hartsell,CMA

## 2017-06-24 NOTE — Telephone Encounter (Signed)
Patient informed but states that she hasn't had any improvement in her symptoms.  She is due to return to work tomorrow and made an appt today to follow up before that and also to get a doctors note for her last visit. Jazmin Hartsell,CMA

## 2017-06-24 NOTE — Patient Instructions (Signed)
I think you likely have a viral bronchitis. However, given the fevers I would like to check a chest x-ray to rule out pneumonia. We will call you with these results and let you know if you need an antibiotic. I have sent in tessalon for your cough as well as Hycodan cough syrup. I recommend only taking the cough syrup at nighttime or when you are able to be at home. You should not drive while taking this medication.

## 2017-06-25 LAB — INFLUENZA A AND B
Influenza A Ag, EIA: NEGATIVE
Influenza B Ag, EIA: NEGATIVE

## 2017-06-25 LAB — PLEASE NOTE:

## 2017-07-02 ENCOUNTER — Telehealth: Payer: Self-pay | Admitting: Obstetrics and Gynecology

## 2017-07-02 NOTE — Telephone Encounter (Signed)
Spoke with patient. Patient states that she is having urinary leakage. Patient is at work and cannot discuss in detail. Denies any problem emptying bladder or any pain. Patient would like to be seen with Dr.Silva for evaluation. Appointment scheduled for 07/12/2017 at 12 pm with Dr.Silva. Patient declines earlier appointment date.  Routing to provider for final review. Patient agreeable to disposition. Will close encounter.

## 2017-07-02 NOTE — Telephone Encounter (Signed)
Left message to call Aoki Wedemeyer at [phone]. 

## 2017-07-02 NOTE — Telephone Encounter (Signed)
Left message returning call to [facility].

## 2017-07-02 NOTE — Telephone Encounter (Signed)
Patient is having bladder leakage and would like to be seen this week.

## 2017-07-04 ENCOUNTER — Other Ambulatory Visit (INDEPENDENT_AMBULATORY_CARE_PROVIDER_SITE_OTHER): Payer: No Typology Code available for payment source

## 2017-07-04 DIAGNOSIS — E785 Hyperlipidemia, unspecified: Secondary | ICD-10-CM

## 2017-07-04 DIAGNOSIS — R7989 Other specified abnormal findings of blood chemistry: Secondary | ICD-10-CM

## 2017-07-04 DIAGNOSIS — E538 Deficiency of other specified B group vitamins: Secondary | ICD-10-CM

## 2017-07-04 DIAGNOSIS — E559 Vitamin D deficiency, unspecified: Secondary | ICD-10-CM | POA: Diagnosis not present

## 2017-07-04 DIAGNOSIS — E119 Type 2 diabetes mellitus without complications: Secondary | ICD-10-CM

## 2017-07-04 LAB — T3, FREE: T3, Free: 3.8 pg/mL (ref 2.3–4.2)

## 2017-07-04 LAB — COMPLETE METABOLIC PANEL WITH GFR
AG Ratio: 1.4 (calc) (ref 1.0–2.5)
ALT: 10 U/L (ref 6–29)
AST: 12 U/L (ref 10–35)
Albumin: 4 g/dL (ref 3.6–5.1)
Alkaline phosphatase (APISO): 80 U/L (ref 33–130)
BUN: 14 mg/dL (ref 7–25)
CO2: 28 mmol/L (ref 20–32)
Calcium: 9.6 mg/dL (ref 8.6–10.4)
Chloride: 106 mmol/L (ref 98–110)
Creat: 0.92 mg/dL (ref 0.50–1.05)
GFR, Est African American: 81 mL/min/{1.73_m2} (ref >=60)
GFR, Est Non African American: 70 mL/min/{1.73_m2} (ref >=60)
Globulin: 2.8 g/dL (calc) (ref 1.9–3.7)
Glucose, Bld: 97 mg/dL (ref 65–99)
Potassium: 4.6 mmol/L (ref 3.5–5.3)
Sodium: 141 mmol/L (ref 135–146)
Total Bilirubin: 0.3 mg/dL (ref 0.2–1.2)
Total Protein: 6.8 g/dL (ref 6.1–8.1)

## 2017-07-04 LAB — T4, FREE: Free T4: 0.7 ng/dL (ref 0.60–1.60)

## 2017-07-04 LAB — MICROALBUMIN / CREATININE URINE RATIO
Creatinine,U: 85.8 mg/dL
Microalb Creat Ratio: 0.8 mg/g (ref 0.0–30.0)
Microalb, Ur: <0.7 mg/dL (ref 0.0–1.9)

## 2017-07-04 LAB — LIPID PANEL
Cholesterol: 255 mg/dL — ABNORMAL HIGH (ref 0–200)
HDL: 42.2 mg/dL (ref >39.00)
LDL Cholesterol: 196 mg/dL — ABNORMAL HIGH (ref 0–99)
NonHDL: 212.5
Total CHOL/HDL Ratio: 6
Triglycerides: 84 mg/dL (ref 0.0–149.0)
VLDL: 16.8 mg/dL (ref 0.0–40.0)

## 2017-07-04 LAB — TSH: TSH: 0.42 u[IU]/mL (ref 0.35–4.50)

## 2017-07-04 LAB — VITAMIN D 25 HYDROXY (VIT D DEFICIENCY, FRACTURES): VITD: 23.26 ng/mL — ABNORMAL LOW (ref 30.00–100.00)

## 2017-07-04 LAB — VITAMIN B12: Vitamin B-12: 368 pg/mL (ref 211–911)

## 2017-07-12 ENCOUNTER — Ambulatory Visit (INDEPENDENT_AMBULATORY_CARE_PROVIDER_SITE_OTHER): Payer: No Typology Code available for payment source | Admitting: Obstetrics and Gynecology

## 2017-07-12 ENCOUNTER — Encounter: Payer: Self-pay | Admitting: Obstetrics and Gynecology

## 2017-07-12 ENCOUNTER — Other Ambulatory Visit: Payer: Self-pay

## 2017-07-12 VITALS — BP 106/72 | HR 72 | Resp 14 | Ht 65.0 in | Wt 198.0 lb

## 2017-07-12 DIAGNOSIS — R635 Abnormal weight gain: Secondary | ICD-10-CM | POA: Diagnosis not present

## 2017-07-12 DIAGNOSIS — N3946 Mixed incontinence: Secondary | ICD-10-CM

## 2017-07-12 DIAGNOSIS — R32 Unspecified urinary incontinence: Secondary | ICD-10-CM

## 2017-07-12 LAB — POCT URINALYSIS DIPSTICK
Bilirubin, UA: NEGATIVE
Blood, UA: NEGATIVE
Glucose, UA: NEGATIVE
Ketones, UA: NEGATIVE
Leukocytes, UA: NEGATIVE
Nitrite, UA: NEGATIVE
Protein, UA: NEGATIVE
Urobilinogen, UA: 0.2 E.U./dL
pH, UA: 7 (ref 5.0–8.0)

## 2017-07-12 MED ORDER — PHENTERMINE HCL 37.5 MG PO CAPS: 37.5000 mg | ORAL_CAPSULE | ORAL | 0 refills | Status: DC

## 2017-07-12 MED ORDER — OXYBUTYNIN CHLORIDE ER 10 MG PO TB24: 10.0000 mg | ORAL_TABLET | Freq: Every day | ORAL | 2 refills | Status: DC

## 2017-07-12 MED FILL — PHENTERMINE 37.5 MG TABLET: 37.5 | 30 days supply | Qty: 30 | Fill #0

## 2017-07-12 MED FILL — OXYBUTYNIN CL ER 10 MG TAB: 10 | 30 days supply | Qty: 30 | Fill #0

## 2017-07-12 NOTE — Patient Instructions (Signed)
Oxybutynin extended-release tablets What is this medicine? OXYBUTYNIN (ox i BYOO ti nin) is used to treat overactive bladder. This medicine reduces the amount of bathroom visits. It may also help to control wetting accidents. This medicine may be used for other purposes; ask your health care provider or pharmacist if you have questions. COMMON BRAND NAME(S): Ditropan XL What should I tell my health care provider before I take this medicine? They need to know if you have any of these conditions: -autonomic neuropathy -dementia -difficulty passing urine -glaucoma -intestinal obstruction -kidney disease -liver disease -myasthenia gravis -Parkinson's disease -an unusual or allergic reaction to oxybutynin, other medicines, foods, dyes, or preservatives -pregnant or trying to get pregnant -breast-feeding How should I use this medicine? Take this medicine by mouth with a glass of water. Swallow whole, do not crush, cut, or chew. Follow the directions on the prescription label. You can take this medicine with or without food. Take your doses at regular intervals. Do not take your medicine more often than directed. Talk to your pediatrician regarding the use of this medicine in children. Special care may be needed. While this drug may be prescribed for children as young as 6 years for selected conditions, precautions do apply. Overdosage: If you think you have taken too much of this medicine contact a poison control center or emergency room at once. NOTE: This medicine is only for you. Do not share this medicine with others. What if I miss a dose? If you miss a dose, take it as soon as you can. If it is almost time for your next dose, take only that dose. Do not take double or extra doses. What may interact with this medicine? -antihistamines for allergy, cough and cold -atropine -certain medicines for bladder problems like oxybutynin, tolterodine -certain medicines for Parkinson's disease like  benztropine, trihexyphenidyl -certain medicines for stomach problems like dicyclomine, hyoscyamine -certain medicines for travel sickness like scopolamine -clarithromycin -erythromycin -ipratropium -medicines for fungal infections, like fluconazole, itraconazole, ketoconazole or voriconazole This list may not describe all possible interactions. Give your health care provider a list of all the medicines, herbs, non-prescription drugs, or dietary supplements you use. Also tell them if you smoke, drink alcohol, or use illegal drugs. Some items may interact with your medicine. What should I watch for while using this medicine? It may take a few weeks to notice the full benefit from this medicine. You may need to limit your intake tea, coffee, caffeinated sodas, and alcohol. These drinks may make your symptoms worse. You may get drowsy or dizzy. Do not drive, use machinery, or do anything that needs mental alertness until you know how this medicine affects you. Do not stand or sit up quickly, especially if you are an older patient. This reduces the risk of dizzy or fainting spells. Alcohol may interfere with the effect of this medicine. Avoid alcoholic drinks. Your mouth may get dry. Chewing sugarless gum or sucking hard candy, and drinking plenty of water may help. Contact your doctor if the problem does not go away or is severe. This medicine may cause dry eyes and blurred vision. If you wear contact lenses, you may feel some discomfort. Lubricating drops may help. See your eyecare professional if the problem does not go away or is severe. You may notice the shells of the tablets in your stool from time to time. This is normal. Avoid extreme heat. This medicine can cause you to sweat less than normal. Your body temperature could increase to dangerous   levels, which may lead to heat stroke. What side effects may I notice from receiving this medicine? Side effects that you should report to your doctor or  health care professional as soon as possible: -allergic reactions like skin rash, itching or hives, swelling of the face, lips, or tongue -agitation -breathing problems -confusion -fever -flushing (reddening of the skin) -hallucinations -memory loss -pain or difficulty passing urine -palpitations -unusually weak or tired Side effects that usually do not require medical attention (report to your doctor or health care professional if they continue or are bothersome): -constipation -headache -sexual difficulties (impotence) This list may not describe all possible side effects. Call your doctor for medical advice about side effects. You may report side effects to FDA at 1-800-FDA-1088. Where should I keep my medicine? Keep out of the reach of children. Store at room temperature between 15 and 30 degrees C (59 and 86 degrees F). Protect from moisture and humidity. Throw away any unused medicine after the expiration date. NOTE: This sheet is a summary. It may not cover all possible information. If you have questions about this medicine, talk to your doctor, pharmacist, or health care provider.  2018 Elsevier/Gold Standard (2013-05-21 10:57:06)  

## 2017-07-12 NOTE — Progress Notes (Signed)
GYNECOLOGY  VISIT   HPI: 57 y.o.   Married  [place] American  female   [phone] with No LMP recorded. Patient has had a hysterectomy.   here for   Urinary leakage.  Panty liners are wet.  DF - every hour. NF - twice a night.   No dysuria or blood in the urine.   Stopped Myrbetriq because she was not doing any better.   Leaks while singing at church.  Had flu and leaked urine with coughing.   Caffeine - Coke, coffee.   No narrow angle glaucoma or cardiac arrhythmia.  Wants a refill of her Phentermine.  States she lost but then gained back.   Lost her mother in law and father in law; both passed in a short amount of time.   Urine dip - negative .  GYNECOLOGIC HISTORY: No LMP recorded. Patient has had a hysterectomy. Contraception:  Tubal/hysterectomy Menopausal hormone therapy:  n/a Last mammogram:  12/11/16 Density C/Neg/BiRads1:TBC Last pap smear:   11/12/16 Pap smear negative        OB History    Gravida  3   Para  2   Term  2   Preterm      AB  1   Living  2     SAB      TAB      Ectopic      Multiple      Live Births  2              Patient Active Problem List   Diagnosis Date Noted  . Flu-like symptoms 06/20/2017  . Dermatitis 04/02/2017  . Cervical disc disorder with radiculopathy of cervical region 12/11/2016  . Low vitamin B12 level 12/04/2016  . Vitamin D deficiency 12/04/2016  . Low TSH level 12/04/2016  . Scapular dyskinesis 10/03/2016  . Right shoulder pain 08/29/2016  . Hypersomnia 07/19/2015  . Sleep apnea 05/27/2015  . Solitary pulmonary nodule 05/27/2015  . Hot flashes 05/27/2015  . GERD (gastroesophageal reflux disease) 05/13/2015  . Diabetes mellitus, type 2 ([place]) 04/12/2015  . Urine, incontinence, stress female 06/18/2014  . Depression with anxiety 01/22/2014  . VAIN I (vaginal intraepithelial neoplasia grade I)   . Hyperlipidemia 10/22/2013  . Migraine headache 10/04/2008    Past Medical History:  Diagnosis Date   . Anxiety   . Arthritis   . Brain tumor (benign) ([place]) 2018  . Chest pain 05/15/2015  . Depression   . Diabetes mellitus without complication ([place])   . GERD (gastroesophageal reflux disease)   . H/O varicella   . History of measles, mumps, or rubella   . HSV-2 (herpes simplex virus 2) infection   . Hx of ovarian cyst   . Hyperlipidemia   . Migraine headache   . Sleep apnea    pt denies  . Sleep deprivation    loss of sleep  . VAIN I (vaginal intraepithelial neoplasia grade I) 2013   pap and confirmed by colposcopic biopsy    Past Surgical History:  Procedure Laterality Date  . ABDOMINAL HYSTERECTOMY    . CHOLECYSTECTOMY  11/13/10  . COLONOSCOPY  Multiple  . DILATION AND CURETTAGE OF UTERUS    . ESOPHAGOGASTRODUODENOSCOPY    . OOPHORECTOMY  2009   bilateral salpingo-oophorectomy.  . TUBAL LIGATION      Current Outpatient Medications  Medication Sig Dispense Refill  . betamethasone valerate (VALISONE) 0.1 % cream Apply topically 2 (two) times daily. 30 g 0  . estradiol (ESTRACE)  1 MG tablet estradiol 1 mg tablet  TAKE ONE TABLET BY MOUTH TWICE DAILY    . glucose blood (ACCU-CHEK AVIVA) test strip Use as instructed to check sugar one time daily 100 each 5  . indomethacin (INDOCIN) 25 MG capsule indomethacin 25 mg capsule  TAKE 1 CAPSULE BY MOUTH 3 TIMES DAILY WITH FOOD FOR HEMICRANIA CONTINUA    . meloxicam (MOBIC) 15 MG tablet Take 1 tablet (15 mg total) by mouth daily. Reported on 05/27/2015 30 tablet 0  . metFORMIN (GLUCOPHAGE-XR) 500 MG 24 hr tablet TAKE 1 TABLET BY MOUTH 2 TIMES DAILY WITH A MEAL. (Patient taking differently: TAKE 1 TABLET BY MOUTH DAILY WITH A MEAL.) 180 tablet 1  . naproxen (EC NAPROSYN) 500 MG EC tablet Take 1 tablet (500 mg total) by mouth 2 (two) times daily as needed. 60 tablet 1  . Pitavastatin Calcium (LIVALO) 2 MG TABS Take 1 tablet (2 mg total) by mouth daily. 90 tablet 3   No current facility-administered medications for this visit.       ALLERGIES: Sulfa antibiotics  Family History  Problem Relation Age of Onset  . Hypertension Mother   . Hypotension Mother   . Anemia Mother        low iron  . Heart disease Sister   . Sickle cell trait Other   . Alcohol abuse Brother   . Alcohol abuse Brother   . Drug abuse Brother   . Colon cancer Neg Hx   . Esophageal cancer Neg Hx   . Stomach cancer Neg Hx     Social History   Socioeconomic History  . Marital status: Married    Spouse name: Not on file  . Number of children: Not on file  . Years of education: Not on file  . Highest education level: Not on file  Occupational History  . Not on file  Social Needs  . Financial resource strain: Not on file  . Food insecurity:    Worry: Not on file    Inability: Not on file  . Transportation needs:    Medical: Not on file    Non-medical: Not on file  Tobacco Use  . Smoking status: Former Smoker    Packs/day: 0.50    Types: Cigarettes  . Smokeless tobacco: Never Used  Substance and Sexual Activity  . Alcohol use: No    Alcohol/week: 0.0 oz  . Drug use: No  . Sexual activity: Yes    Partners: Male    Birth control/protection: Surgical    Comment: Hysterectomy  Lifestyle  . Physical activity:    Days per week: Not on file    Minutes per session: Not on file  . Stress: Not on file  Relationships  . Social connections:    Talks on phone: Not on file    Gets together: Not on file    Attends religious service: Not on file    Active member of club or organization: Not on file    Attends meetings of clubs or organizations: Not on file    Relationship status: Not on file  . Intimate partner violence:    Fear of current or ex partner: Not on file    Emotionally abused: Not on file    Physically abused: Not on file    Forced sexual activity: Not on file  Other Topics Concern  . Not on file  Social History Narrative   Patient lives at home with her husband [name]). 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 Two children. Both boys    ROS:  Pertinent items are noted in HPI.  PHYSICAL EXAMINATION:    BP 106/72 (BP Location: Right Arm, Patient Position: Sitting, Cuff Size: Normal)   Pulse 72   Resp 14   Ht 5\' 5"  (1.651 m)   Wt 198 lb (89.8 kg)   BMI 32.95 kg/m     General appearance: alert, cooperative and appears stated age   ASSESSMENT  Weight gain.  Overactive bladder.  Stress incontinence.  Elevated cholesterol.  PLAN  Phentermine 37.5 mg.  Ditropan XL 10 mg. Discussed side effects. Avoid bladder irritants.  FU in one month.    An After Visit Summary was printed and given to the patient.  ___15___ minutes face to face time of which over 50% was spent in counseling.

## 2017-07-22 ENCOUNTER — Telehealth: Payer: Self-pay | Admitting: Obstetrics and Gynecology

## 2017-07-22 MED ORDER — FESOTERODINE FUMARATE ER 4 MG PO TB24: 4.0000 mg | ORAL_TABLET | Freq: Every day | ORAL | 1 refills | Status: DC

## 2017-07-22 NOTE — Telephone Encounter (Signed)
Patient called complaining of dry mouth taking Ditropan XL. She said, "This dry mouth is more than I can take."

## 2017-07-22 NOTE — Telephone Encounter (Signed)
Ok for [employer] 4 mg by mouth daily.  Dispense: 30, RF: one.  FU in about 6 weeks.

## 2017-07-22 NOTE — Telephone Encounter (Signed)
Spoke with patient. Advised of message as seen below from [place]. Rx for Toviaz 4 mg daily #30 1RF sent to pharmacy on file. Patient is agreeable. Patient is returning for a follow up on 5/24 with Dr.Silva and will discuss how she is doing at that time. Will schedule another follow up if needed at that appointment.  Routing to provider for final review. Patient agreeable to disposition. Will close encounter.

## 2017-07-22 NOTE — Telephone Encounter (Signed)
Spoke with patient. Patient states that she is having extremely dry mouth with Ditropan XL. Unable to tolerate. Has taken Mybetriq in the past without symptom relief. Asking for an alternative medication. Advised will review with Dr.Silva and return call.

## 2017-07-22 NOTE — Telephone Encounter (Signed)
Called patient and she states she's checking in a patient and will have to call me back.

## 2017-07-24 ENCOUNTER — Telehealth: Payer: Self-pay | Admitting: Obstetrics and Gynecology

## 2017-07-24 MED ORDER — TOLTERODINE TARTRATE ER 4 MG PO CP24: 4.0000 mg | ORAL_CAPSULE | Freq: Every day | ORAL | 0 refills | Status: DC

## 2017-07-24 MED FILL — TOLTERODINE TART ER 4 MG CA: 4 | 30 days supply | Qty: 30 | Fill #0

## 2017-07-24 NOTE — Telephone Encounter (Signed)
[facility] outpatient pharmacy calling to follow up on a prescription for patient. Maryanne said something was faxed over yesterday. Pharmacy number is [phone].

## 2017-07-24 NOTE — Telephone Encounter (Signed)
Faxed paper from the pharmacy to Dr.Silva to review regarding medication alternative.

## 2017-07-24 NOTE — Telephone Encounter (Signed)
Ok for [employer] LA 4 mg daily.  Dispense:  30, RF one.

## 2017-07-24 NOTE — Telephone Encounter (Signed)
Rx for [employer]rol LA 4 mg daily #30 oRF sent to pharmacy on file. Spoke with patient who is agreeable and verbalizes understanding. Encounter closed.

## 2017-07-26 ENCOUNTER — Ambulatory Visit: Payer: Self-pay | Admitting: Internal Medicine

## 2017-08-05 ENCOUNTER — Other Ambulatory Visit: Payer: Self-pay

## 2017-08-05 ENCOUNTER — Encounter: Payer: Self-pay | Admitting: Internal Medicine

## 2017-08-05 ENCOUNTER — Ambulatory Visit (INDEPENDENT_AMBULATORY_CARE_PROVIDER_SITE_OTHER): Payer: No Typology Code available for payment source | Admitting: Internal Medicine

## 2017-08-05 DIAGNOSIS — M542 Cervicalgia: Secondary | ICD-10-CM | POA: Diagnosis not present

## 2017-08-05 MED ORDER — CYCLOBENZAPRINE HCL 10 MG PO TABS: 10.0000 mg | ORAL_TABLET | Freq: Three times a day (TID) | ORAL | 0 refills | Status: DC | PRN

## 2017-08-05 MED FILL — CYCLOBENZAPRINE 10 MG TAB: 10 | 5 days supply | Qty: 15 | Fill #0

## 2017-08-05 NOTE — Progress Notes (Signed)
   Subjective:   Patient: [name]       Birthdate: 1960-10-30       MRN: [id number]      HPI  DOROTEA HAND is a 57 y.o. female presenting for same day appt for neck pain.   Neck pain On L. Started with pain below her ear about one week ago, then three days ago pain more located lower in neck towards L shoulder. Pain is worse with movement, particularly when looking toward L. Describes pain as nagging. Has taken Alevel which didn't help much. Has not tried heating pads. Denies fevers, photophobia, vomiting. No pain in ear itself, no hearing changes, no ear drainage.   Smoking status reviewed. Patient is never smoker.   Review of Systems See HPI.     Objective:  Physical Exam  Constitutional: She is oriented to person, place, and time. She appears well-developed and well-nourished. No distress.  HENT:  Head: Normocephalic and atraumatic.  Right Ear: External ear normal.  Left Ear: External ear normal.  Neck: Neck supple.  TTP of L side of neck beginning below ear and extending to shoulder. ROM limited due to pain when looking to L and when putting chin to chest. Full ROM looking up and looking to R.   Neurological: She is alert and oriented to person, place, and time.  Skin: Skin is warm and dry.  Psychiatric: She has a normal mood and affect. Her behavior is normal.   Assessment & Plan:  Neck pain Most consistent with MSK etiology, given reproducibility with palpation as well as worsening with certain movements. No sx of meningitis, and location of neck pain not consistent with typical pain associated with meningitis. Will treat symptomatically with Flexeril PRN, as well as scheduled ibuprofen for next few days. Also encouraged heating pad use and provided printout of stretching exercises. Discussed that MSK pain can take time to fully resolve, but encouraged patient to call if no improvement by next week.    Adin Hector, MD, MPH PGY-3 [facility] Pager [phone]

## 2017-08-05 NOTE — Patient Instructions (Addendum)
It was nice meeting you today [name]!  For neck pain, you can take one Flexeril tablet up to every 8 hours as needed. Begin taking two ibuprofen tablets every 8 hours for the next 5 days or so to help improve inflammation and next pain. You can use a heating pad to help with the pain as well. Gentle stretching exercises will also help improve your pain (see attached).  If your pain has not started to improve in about a week, please let us know.    If you have any questions or concerns, please feel free to call the clinic.   Be well,  Dr. Avon Gully

## 2017-08-05 NOTE — Assessment & Plan Note (Signed)
Most consistent with MSK etiology, given reproducibility with palpation as well as worsening with certain movements. No sx of meningitis, and location of neck pain not consistent with typical pain associated with meningitis. Will treat symptomatically with Flexeril PRN, as well as scheduled ibuprofen for next few days. Also encouraged heating pad use and provided printout of stretching exercises. Discussed that MSK pain can take time to fully resolve, but encouraged patient to call if no improvement by next week.

## 2017-08-06 ENCOUNTER — Ambulatory Visit: Payer: Self-pay | Admitting: Family Medicine

## 2017-08-09 ENCOUNTER — Ambulatory Visit: Payer: No Typology Code available for payment source | Admitting: Obstetrics and Gynecology

## 2017-08-15 ENCOUNTER — Encounter: Payer: Self-pay | Admitting: Internal Medicine

## 2017-08-15 ENCOUNTER — Ambulatory Visit: Payer: No Typology Code available for payment source | Admitting: Internal Medicine

## 2017-08-15 VITALS — BP 118/78 | HR 86 | Ht 65.0 in | Wt 195.8 lb

## 2017-08-15 DIAGNOSIS — E785 Hyperlipidemia, unspecified: Secondary | ICD-10-CM | POA: Diagnosis not present

## 2017-08-15 DIAGNOSIS — E559 Vitamin D deficiency, unspecified: Secondary | ICD-10-CM

## 2017-08-15 DIAGNOSIS — E1165 Type 2 diabetes mellitus with hyperglycemia: Secondary | ICD-10-CM | POA: Diagnosis not present

## 2017-08-15 DIAGNOSIS — E538 Deficiency of other specified B group vitamins: Secondary | ICD-10-CM | POA: Diagnosis not present

## 2017-08-15 LAB — POCT GLYCOSYLATED HEMOGLOBIN (HGB A1C): Hemoglobin A1C: 6.3 % — AB (ref 4.0–5.6)

## 2017-08-15 MED ORDER — PITAVASTATIN CALCIUM 2 MG PO TABS: ORAL_TABLET | ORAL | 3 refills | Status: DC

## 2017-08-15 MED ORDER — CYANOCOBALAMIN 1000 MCG/ML IJ SOLN
1000.0000 ug | Freq: Once | INTRAMUSCULAR | Status: AC
  Administered 2017-08-15: 1000 ug via INTRAMUSCULAR

## 2017-08-15 MED ORDER — METFORMIN HCL ER 500 MG PO TB24: ORAL_TABLET | ORAL | 3 refills | Status: DC

## 2017-08-15 MED ORDER — VITAMIN D (ERGOCALCIFEROL) 1.25 MG (50000 UNIT) PO CAPS: 50000.0000 [IU] | ORAL_CAPSULE | ORAL | 3 refills | Status: DC

## 2017-08-15 MED FILL — LIVALO 2 MG TABLET: 2 | 90 days supply | Qty: 90 | Fill #0

## 2017-08-15 MED FILL — VIT D2 1.25 MG (50,000 UNIT: 1.25 MG | 84 days supply | Qty: 12 | Fill #0

## 2017-08-15 NOTE — Progress Notes (Signed)
Patient ID: [name], female   DOB: 23-Apr-1960, 57 y.o.   MRN: [id number]  HPI: [name] is a 57 y.o.-year-old female, returning for f/u for DM2; prediabetes dx early 2000s, controlled, without long term complications, hyperlipidemia, subclinical thyrotoxicosis. Also, vitamin D deficiency and low vitamin B12.Last visit 4 mo ago.  She had multiple deaths in her family >> stressed >> dietary indiscretions: icecream at night  She is treated for urinary incontinence.  She stopped smoking.  Last hemoglobin A1c was: Lab Results  Component Value Date   HGBA1C 6 04/05/2017   HGBA1C 6.2 12/04/2016   HGBA1C 6.2 08/03/2016  She had several steroid inj in the past- last in 04/2014.  She is on: - Metformin ER 500 mg 2x a day >> we had to decrease to once a day 2/2 GI discomfort >> currently on 500 mg with dinner. She was previously on: - Tradjenta 5 mg in am, before b'fast  - Invokana 100 mg in am - started 03/2015 >> she had CP, HA, lightheadedness - regular metformin >> diarrhea/nausea with regular metformin.  Pt checks sugars 0-1x a day: - am:  95-98 >> 95-103 >> 77, 85-95 >> 98-103, 118 - 2h after b'fast:  102 >> n/c >> 90-125 - lunch: 95-100 >> 95-100 >> 90s >> n/c - 2h after lunch:120-145 >> 95-100 >> n/c - before dinner (snacking):80-100 >> n/c - 2h after dinner:   up to 120 >> 120-127 >> up to 135 - bedtime: 85-98 >> 85-100 >> n/c Lowest sugar was 95 >> 77 >> 66; it is unclear at which level she has hypoglycemia. Highest:  120 >> 127 >> 135.  Meter: AccuChek >>  [employer]: - b'fast: oatmeal + cinnamon + raisons; egg + toast + bacon - lunch: grilled chicken salad or sandwich + chips - snack: fruit, crackers + cheese - dinner: chicken, green beans, potato  She works in the [employer].  -  nNo CKD, last BUN/creatinine:  Lab Results  Component Value Date   BUN 14 07/04/2017   CREATININE 0.92 07/04/2017   - last eye exam was in  05/2017: No DR - no numbness and tingling in her feet  HL: Reviewed last 3 lipid sets: - Lipids MUCH worse at last check - dietary indiscretions and was forgetting Livalo >> now back on this: Lab Results  Component Value Date   CHOL 255 (H) 07/04/2017   CHOL 167 04/19/2016   CHOL 179 04/26/2015   Lab Results  Component Value Date   HDL 42.20 07/04/2017   HDL 44.50 04/19/2016   HDL 42.60 04/26/2015   Lab Results  Component Value Date   LDLCALC 196 (H) 07/04/2017   LDLCALC 102 (H) 04/19/2016   LDLCALC 123 (H) 04/26/2015   Lab Results  Component Value Date   TRIG 84.0 07/04/2017   TRIG 101.0 04/19/2016   TRIG 66.0 04/26/2015   Lab Results  Component Value Date   CHOLHDL 6 07/04/2017   CHOLHDL 4 04/19/2016   CHOLHDL 4 04/26/2015   Normal LFTs: Lab Results  Component Value Date   ALT 10 07/04/2017   AST 12 07/04/2017   ALKPHOS 79 05/15/2016   BILITOT 0.3 07/04/2017   In spring/2018, as she was complaining of fatigue, we checked vitamin levels and vitamin B12 and D were low: Lab Results  Component Value Date   VD25OH 23.26 (L) 07/04/2017   VD25OH 17.98 (L) 08/03/2016   VD25OH 9 (L) 05/13/2015   Lab  Results  Component Value Date   VITAMINB12 368 07/04/2017   VITAMINB12 315 08/03/2016   VITAMINB12 428 05/13/2015   VITAMINB12 496 01/22/2014   She is on: - B12 po 5000 mcg daily >> ran out - Ergocalciferol 50,000 units weekly >> ran out  Subclinical thyrotoxicosis  - she has a h/o slightly low TSH levels with normal free thyroid hh, but this normalized in last year: Lab Results  Component Value Date   TSH 0.42 07/04/2017   TSH 0.97 12/04/2016   TSH 0.34 (L) 08/03/2016   TSH 0.430 (L) 06/08/2016   TSH 0.77 05/13/2015   TSH 0.619 01/22/2014   TSH 0.730 10/14/2013   TSH 0.492 09/01/2012   TSH 0.745 10/05/2011   Lab Results  Component Value Date   FREET4 0.70 07/04/2017   FREET4 0.77 12/04/2016   FREET4 0.79 08/03/2016   T3FREE 3.8 07/04/2017    T3FREE 3.4 12/04/2016   T3FREE 3.6 08/03/2016   She has dysphagia >> had esophageal stretching.  ROS: Constitutional: + weight gain/no weight loss, + fatigue, no subjective hyperthermia, no subjective hypothermia Eyes: no blurry vision, no xerophthalmia ENT: no sore throat, no nodules palpated in throat, no dysphagia, no odynophagia, no hoarseness Cardiovascular: no CP/no SOB/no palpitations/no leg swelling Respiratory: no cough/no SOB/no wheezing Gastrointestinal: + N/no V/no D/no C/no acid reflux Musculoskeletal: no muscle aches/no joint aches Skin: no rashes, no hair loss Neurological: no tremors/no numbness/no tingling/no dizziness  I reviewed pt's medications, allergies, PMH, social hx, family hx, and changes were documented in the history of present illness. Otherwise, unchanged from my initial visit note.  Past Medical History:  Diagnosis Date  . Anxiety   . Arthritis   . Brain tumor (benign) ([place]) 2018  . Chest pain 05/15/2015  . Depression   . Diabetes mellitus without complication ([place])   . GERD (gastroesophageal reflux disease)   . H/O varicella   . History of measles, mumps, or rubella   . HSV-2 (herpes simplex virus 2) infection   . Hx of ovarian cyst   . Hyperlipidemia   . Migraine headache   . Sleep apnea    pt denies  . Sleep deprivation    loss of sleep  . 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 ABDOMINAL HYSTERECTOMY    . CHOLECYSTECTOMY  11/13/10  . COLONOSCOPY  Multiple  . DILATION AND CURETTAGE OF UTERUS    . ESOPHAGOGASTRODUODENOSCOPY    . OOPHORECTOMY  2009   bilateral salpingo-oophorectomy.  . TUBAL LIGATION     Social History   Socioeconomic History  . Marital status: Married    Spouse name: Not on file  . Number of children: Not on file  . Years of education: Not on file  . Highest education level: Not on file  Occupational  History  . Not on file  Social Needs  . Financial resource strain: Not on file  . Food insecurity:    Worry: Not on file    Inability: Not on file  . Transportation needs:    Medical: Not on file    Non-medical: Not on file  Tobacco Use  . Smoking status: Former Smoker    Packs/day: 0.50    Types: Cigarettes  . Smokeless tobacco: Never Used  Substance and Sexual Activity  . Alcohol use: No    Alcohol/week: 0.0 oz  . Drug use: No  .  Sexual activity: Yes    Partners: Male    Birth control/protection: Surgical    Comment: Hysterectomy  Lifestyle  . Physical activity:    Days per week: Not on file    Minutes per session: Not on file  . Stress: Not on file  Relationships  . Social connections:    Talks on phone: Not on file    Gets together: Not on file    Attends religious service: Not on file    Active member of club or organization: Not on file    Attends meetings of clubs or organizations: Not on file    Relationship status: Not on file  . Intimate partner violence:    Fear of current or ex partner: Not on file    Emotionally abused: Not on file    Physically abused: Not on file    Forced sexual activity: Not on file  Other Topics Concern  . Not on file  Social History Narrative   Patient lives at home with her husband [name]). 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 Two children. Both boys   Current Outpatient Medications on File Prior to Visit  Medication Sig Dispense Refill  . betamethasone valerate (VALISONE) 0.1 % cream Apply topically 2 (two) times daily. 30 g 0  . cyclobenzaprine (FLEXERIL) 10 MG tablet Take 1 tablet (10 mg total) by mouth 3 (three) times daily as needed for muscle spasms. 15 tablet 0  . estradiol (ESTRACE) 1 MG tablet estradiol 1 mg tablet  TAKE ONE TABLET BY MOUTH TWICE DAILY    . glucose blood (ACCU-CHEK AVIVA) test strip Use as instructed to check sugar one time  daily 100 each 5  . indomethacin (INDOCIN) 25 MG capsule indomethacin 25 mg capsule  TAKE 1 CAPSULE BY MOUTH 3 TIMES DAILY WITH FOOD FOR HEMICRANIA CONTINUA    . phentermine 37.5 MG capsule Take 1 capsule (37.5 mg total) by mouth every morning. 30 capsule 0  . tolterodine (DETROL LA) 4 MG 24 hr capsule Take 1 capsule (4 mg total) by mouth daily. 30 capsule 0   No current facility-administered medications on file prior to visit.    Allergies  Allergen Reactions  . Sulfa Antibiotics Anaphylaxis, Hives and Swelling   Family History  Problem Relation Age of Onset  . Hypertension Mother   . Hypotension Mother   . Anemia Mother        low iron  . Heart disease Sister   . Sickle cell trait Other   . Alcohol abuse Brother   . Alcohol abuse Brother   . Drug abuse Brother   . Colon cancer Neg Hx   . Esophageal cancer Neg Hx   . Stomach cancer Neg Hx     PE: BP 118/78   Pulse 86   Ht 5\' 5"  (1.651 m)   Wt 195 lb 12.8 oz (88.8 kg)   SpO2 99%   BMI 32.58 kg/m  Body mass index is 32.58 kg/m. Wt Readings from Last 3 Encounters:  08/05/17 197 lb 12.8 oz (89.7 kg)  07/12/17 198 lb (89.8 kg)  06/24/17 198 lb (89.8 kg)   Constitutional: overweight, in NAD Eyes: PERRLA, EOMI, no exophthalmos ENT: moist mucous membranes, no thyromegaly, no cervical lymphadenopathy Cardiovascular: RRR, No MRG Respiratory: CTA B Gastrointestinal: abdomen soft, NT, ND, BS+ Musculoskeletal: no deformities, strength intact in all 4 Skin: moist, warm, no rashes Neurological: no tremor  with outstretched hands, DTR normal in all 4  ASSESSMENT: 1. DM2, non-insulin dependent, without complications, controlled  2. HL  3. Low TSH  4.  Low vitamin D and B12  PLAN:  1. Patient with controlled type 2 diabetes, on low-dose metformin without side effects.  At last visit, she had improvement in her blood sugars while doing a Daniel fast.  We did not change her regimen at that time. Since then, she had many fam.  mb's dying >> stressed >> ate more sweets >> sugars a little higher - sugars are still at goal >> no changes needed in her regimen - I suggested to: Patient Instructions  Please continue Ergocalciferol 50,000 units weekly.  Please start B12 injections  Please restart Livalo.  Please continue: - Metformin ER 500 mg once a day with dinner.  Please return in 6 months with your sugar log.    - today, HbA1c is 6.3% (higher) - continue checking sugars at different times of the day - check 1x a day, rotating checks - advised for yearly eye exams >> she is UTD - Return to clinic in 6 mo with sugar log   2. HL - Reviewed latest lipid panel from 06/2017: very high LDL! 2/2 dietary indiscretions and being off Livalo (forgot to take it) - will restart Livalo now >> refilled. No side effects.  3. Low TSH - resolved per latest TFTs from 06/2017  4. Low vitamin D and B12 - we started B12 inj, but did not come for monthly inj's >> at last visit I suggested B12 po 5000 mcg daily >> not taking this >> would like to go back to inj's >> will start today - we continued Ergocalciferol 50,000 units weekly >> ran out >> refilled now - levels from 06/2017 >> B12 better, normal; vit D better but still low   Philemon Kingdom, MD PhD [facility] Endocrinology

## 2017-08-15 NOTE — Addendum Note (Signed)
Addended by: Drucilla Schmidt on: 08/15/2017 02:20 PM   Modules accepted: Orders

## 2017-08-15 NOTE — Patient Instructions (Addendum)
Please continue Ergocalciferol 50,000 units weekly.  Please start B12 injections  Please restart Livalo.  Please continue: - Metformin ER 500 mg once a day with dinner.  Please return in 6 months with your sugar log.

## 2017-08-28 NOTE — Progress Notes (Signed)
GYNECOLOGY  VISIT   HPI: 57 y.o.   Married  [place] American  female   [phone] with No LMP recorded (lmp unknown). Patient has had a hysterectomy.   here for 1 month recheck.  Had been on Myrbetriq, which did not help.  Detrol knocks her out if she takes it too late.  Wants to continue this. Still changing panty line twice a day.  Not certain if she is sweating too much.   Feels like she is voiding all the time and not really emptying much.   Rash on her back which is irritated at night.  Raised region.  Using betamethasone from dermatology.  Lots of itching.   Inlaws passed away and nephew and cousin all passed recently.  Has been stress eating.  Wants to continue the Phentermine.   GYNECOLOGIC HISTORY: No LMP recorded (lmp unknown). Patient has had a hysterectomy. Contraception:  Tubal/hysterectomy Menopausal hormone therapy:  n/a Last mammogram:  12/11/16 Density C/Neg/BiRads1:TBC Last pap smear:   11/12/16 Pap smear negative        OB History    Gravida  3   Para  2   Term  2   Preterm      AB  1   Living  2     SAB      TAB      Ectopic      Multiple      Live Births  2              Patient Active Problem List   Diagnosis Date Noted  . Neck pain 08/05/2017  . Flu-like symptoms 06/20/2017  . Dermatitis 04/02/2017  . Cervical disc disorder with radiculopathy of cervical region 12/11/2016  . Low vitamin B12 level 12/04/2016  . Vitamin D deficiency 12/04/2016  . Low TSH level 12/04/2016  . Scapular dyskinesis 10/03/2016  . Right shoulder pain 08/29/2016  . Hypersomnia 07/19/2015  . Sleep apnea 05/27/2015  . Solitary pulmonary nodule 05/27/2015  . Hot flashes 05/27/2015  . GERD (gastroesophageal reflux disease) 05/13/2015  . Diabetes mellitus, type 2 ([place]) 04/12/2015  . Urine, incontinence, stress female 06/18/2014  . Depression with anxiety 01/22/2014  . VAIN I (vaginal intraepithelial neoplasia grade I)   . Hyperlipidemia 10/22/2013  .  Migraine headache 10/04/2008    Past Medical History:  Diagnosis Date  . Anxiety   . Arthritis   . Brain tumor (benign) ([place]) 2018  . Chest pain 05/15/2015  . Depression   . Diabetes mellitus without complication ([place])   . GERD (gastroesophageal reflux disease)   . H/O varicella   . History of measles, mumps, or rubella   . HSV-2 (herpes simplex virus 2) infection   . Hx of ovarian cyst   . Hyperlipidemia   . Migraine headache   . Sleep apnea    pt denies  . Sleep deprivation    loss of sleep  . VAIN I (vaginal intraepithelial neoplasia grade I) 2013   pap and confirmed by colposcopic biopsy    Past Surgical History:  Procedure Laterality Date  . ABDOMINAL HYSTERECTOMY    . CHOLECYSTECTOMY  11/13/10  . COLONOSCOPY  Multiple  . DILATION AND CURETTAGE OF UTERUS    . ESOPHAGOGASTRODUODENOSCOPY    . OOPHORECTOMY  2009   bilateral salpingo-oophorectomy.  . TUBAL LIGATION      Current Outpatient Medications  Medication Sig Dispense Refill  . betamethasone valerate (VALISONE) 0.1 % cream Apply topically 2 (two) times daily. 30 g  0  . cyclobenzaprine (FLEXERIL) 10 MG tablet Take 1 tablet (10 mg total) by mouth 3 (three) times daily as needed for muscle spasms. 15 tablet 0  . estradiol (ESTRACE) 1 MG tablet estradiol 1 mg tablet  TAKE ONE TABLET BY MOUTH TWICE DAILY    . glucose blood (ACCU-CHEK AVIVA) test strip Use as instructed to check sugar one time daily 100 each 5  . indomethacin (INDOCIN) 25 MG capsule indomethacin 25 mg capsule  TAKE 1 CAPSULE BY MOUTH 3 TIMES DAILY WITH FOOD FOR HEMICRANIA CONTINUA    . metFORMIN (GLUCOPHAGE-XR) 500 MG 24 hr tablet TAKE 1 TABLET BY MOUTH DAILY WITH A MEAL. 180 tablet 3  . phentermine 37.5 MG capsule Take 1 capsule (37.5 mg total) by mouth every morning. 30 capsule 0  . Pitavastatin Calcium (LIVALO) 2 MG TABS Use 1x a day 90 tablet 3  . tolterodine (DETROL LA) 4 MG 24 hr capsule Take 1 capsule (4 mg total) by mouth daily. 30 capsule 0   . Vitamin D, Ergocalciferol, (DRISDOL) 50000 units CAPS capsule Take 1 capsule (50,000 Units total) by mouth every 7 (seven) days. 12 capsule 3   No current facility-administered medications for this visit.      ALLERGIES: Sulfa antibiotics  Family History  Problem Relation Age of Onset  . Hypertension Mother   . Hypotension Mother   . Anemia Mother        low iron  . Heart disease Sister   . Sickle cell trait Other   . Alcohol abuse Brother   . Alcohol abuse Brother   . Drug abuse Brother   . Colon cancer Neg Hx   . Esophageal cancer Neg Hx   . Stomach cancer Neg Hx     Social History   Socioeconomic History  . Marital status: Married    Spouse name: Not on file  . Number of children: Not on file  . Years of education: Not on file  . Highest education level: Not on file  Occupational History  . Not on file  Social Needs  . Financial resource strain: Not on file  . Food insecurity:    Worry: Not on file    Inability: Not on file  . Transportation needs:    Medical: Not on file    Non-medical: Not on file  Tobacco Use  . Smoking status: Former Smoker    Packs/day: 0.50    Types: Cigarettes  . Smokeless tobacco: Never Used  Substance and Sexual Activity  . Alcohol use: No    Alcohol/week: 0.0 oz  . Drug use: No  . Sexual activity: Yes    Partners: Male    Birth control/protection: Surgical    Comment: Hysterectomy  Lifestyle  . Physical activity:    Days per week: Not on file    Minutes per session: Not on file  . Stress: Not on file  Relationships  . Social connections:    Talks on phone: Not on file    Gets together: Not on file    Attends religious service: Not on file    Active member of club or organization: Not on file    Attends meetings of clubs or organizations: Not on file    Relationship status: Not on file  . Intimate partner violence:    Fear of current or ex partner: Not on file    Emotionally abused: Not on file    Physically  abused: Not on file    Forced  sexual activity: Not on file  Other Topics Concern  . Not on file  Social History Narrative   Patient lives at home with her husband [name]). 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 Two children. Both boys    Review of Systems  Constitutional: Negative.   HENT: Negative.   Eyes: Negative.   Respiratory: Negative.   Cardiovascular: Negative.   Gastrointestinal: Negative.   Endocrine: Negative.   Genitourinary: Negative.   Musculoskeletal: Negative.   Skin: Positive for rash.  Allergic/Immunologic: Negative.   Neurological: Negative.   Hematological: Negative.   Psychiatric/Behavioral: Negative.     PHYSICAL EXAMINATION:    BP 128/80 (BP Location: Right Arm, Patient Position: Sitting, Cuff Size: Large)   Pulse 88   Resp 16   Ht 5\' 5"  (1.651 m)   Wt 197 lb (89.4 kg)   LMP  (LMP Unknown)   BMI 32.78 kg/m     General appearance: alert, cooperative and appears stated age  Back:  Raised vertical rash on mid back of coalescing areas - 5 x 15 cm.   ASSESSMENT  Overactive bladder.  On Detrol LA. Difficulty loosing weight.  On Phentermine.  Pruritic rash.   PLAN  Continue Detrol LA.  Rx for 6 months. Can have refills if up to date for annual exam. Phentermine 37.5 mg x 1 month.  I recommended she consider a comprehensive weight management program like [facility].  She prefers to not receive her weight management care through the [facility] at this time.  She will follow up with dermatology.  I suggested she ask for a biopsy of the area if it is not responding to the steroid she is taking.    An After Visit Summary was printed and given to the patient.  ___15___ minutes face to face time of which over 50% was spent in counseling.

## 2017-08-29 ENCOUNTER — Other Ambulatory Visit: Payer: Self-pay

## 2017-08-29 ENCOUNTER — Ambulatory Visit (INDEPENDENT_AMBULATORY_CARE_PROVIDER_SITE_OTHER): Payer: No Typology Code available for payment source | Admitting: Obstetrics and Gynecology

## 2017-08-29 ENCOUNTER — Encounter: Payer: Self-pay | Admitting: Obstetrics and Gynecology

## 2017-08-29 VITALS — BP 128/80 | HR 88 | Resp 16 | Ht 65.0 in | Wt 197.0 lb

## 2017-08-29 DIAGNOSIS — Z6832 Body mass index (BMI) 32.0-32.9, adult: Secondary | ICD-10-CM

## 2017-08-29 DIAGNOSIS — N3281 Overactive bladder: Secondary | ICD-10-CM | POA: Diagnosis not present

## 2017-08-29 DIAGNOSIS — R21 Rash and other nonspecific skin eruption: Secondary | ICD-10-CM

## 2017-08-29 MED ORDER — TOLTERODINE TARTRATE ER 4 MG PO CP24: 4.0000 mg | ORAL_CAPSULE | Freq: Every day | ORAL | 5 refills | Status: DC

## 2017-08-29 MED ORDER — PHENTERMINE HCL 37.5 MG PO CAPS: 37.5000 mg | ORAL_CAPSULE | ORAL | 0 refills | Status: DC

## 2017-08-29 MED FILL — PHENTERMINE 37.5 MG TABLET: 37.5 | 30 days supply | Qty: 30 | Fill #0

## 2017-08-29 MED FILL — TOLTERODINE TART ER 4 MG CA: 4 | 30 days supply | Qty: 30 | Fill #0

## 2017-08-29 NOTE — Patient Instructions (Signed)
Consider [facility] program at [facility].

## 2017-08-30 ENCOUNTER — Ambulatory Visit: Payer: No Typology Code available for payment source | Admitting: Obstetrics and Gynecology

## 2017-09-10 ENCOUNTER — Other Ambulatory Visit: Payer: Self-pay

## 2017-09-10 ENCOUNTER — Encounter: Payer: Self-pay | Admitting: Family Medicine

## 2017-09-10 ENCOUNTER — Ambulatory Visit (INDEPENDENT_AMBULATORY_CARE_PROVIDER_SITE_OTHER): Payer: No Typology Code available for payment source | Admitting: Family Medicine

## 2017-09-10 DIAGNOSIS — M5010 Cervical disc disorder with radiculopathy, unspecified cervical region: Secondary | ICD-10-CM | POA: Diagnosis not present

## 2017-09-10 DIAGNOSIS — M25579 Pain in unspecified ankle and joints of unspecified foot: Secondary | ICD-10-CM

## 2017-09-10 DIAGNOSIS — M25571 Pain in right ankle and joints of right foot: Secondary | ICD-10-CM

## 2017-09-10 DIAGNOSIS — R21 Rash and other nonspecific skin eruption: Secondary | ICD-10-CM | POA: Insufficient documentation

## 2017-09-10 NOTE — Assessment & Plan Note (Signed)
??   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 Hold topical steroid cream till after biopsy. Will decide treatment option per biopsy report. Schedule derm appointment on her way out today. She agreed with plan.

## 2017-09-10 NOTE — Patient Instructions (Signed)
Neck Exercises Neck exercises can be important for many reasons:  They can help you to improve and maintain flexibility in your neck. This can be especially important as you age.  They can help to make your neck stronger. This can make movement easier.  They can reduce or prevent neck pain.  They may help your upper back.  Ask your health care provider which neck exercises would be best for you. Exercises Neck Press Repeat this exercise 10 times. Do it first thing in the morning and right before bed or as told by your health care provider. 1. Lie on your back on a firm bed or on the floor with a pillow under your head. 2. Use your neck muscles to push your head down on the pillow and straighten your spine. 3. Hold the position as well as you can. Keep your head facing up and your chin tucked. 4. Slowly count to 5 while holding this position. 5. Relax for a few seconds. Then repeat.  Isometric Strengthening Do a full set of these exercises 2 times a day or as told by your health care provider. 1. Sit in a supportive chair and place your hand on your forehead. 2. Push forward with your head and neck while pushing back with your hand. Hold for 10 seconds. 3. Relax. Then repeat the exercise 3 times. 4. Next, do thesequence again, this time putting your hand against the back of your head. Use your head and neck to push backward against the hand pressure. 5. Finally, do the same exercise on either side of your head, pushing sideways against the pressure of your hand.  Prone Head Lifts Repeat this exercise 5 times. Do this 2 times a day or as told by your health care provider. 1. Lie face-down, resting on your elbows so that your chest and upper back are raised. 2. Start with your head facing downward, near your chest. Position your chin either on or near your chest. 3. Slowly lift your head upward. Lift until you are looking straight ahead. Then continue lifting your head as far back as  you can stretch. 4. Hold your head up for 5 seconds. Then slowly lower it to your starting position.  Supine Head Lifts Repeat this exercise 8-10 times. Do this 2 times a day or as told by your health care provider. 1. Lie on your back, bending your knees to point to the ceiling and keeping your feet flat on the floor. 2. Lift your head slowly off the floor, raising your chin toward your chest. 3. Hold for 5 seconds. 4. Relax and repeat.  Scapular Retraction Repeat this exercise 5 times. Do this 2 times a day or as told by your health care provider. 1. Stand with your arms at your sides. Look straight ahead. 2. Slowly pull both shoulders backward and downward until you feel a stretch between your shoulder blades in your upper back. 3. Hold for 10-30 seconds. 4. Relax and repeat.  Contact a health care provider if:  Your neck pain or discomfort gets much worse when you do an exercise.  Your neck pain or discomfort does not improve within 2 hours after you exercise. If you have any of these problems, stop exercising right away. Do not do the exercises again unless your health care provider says that you can. Get help right away if:  You develop sudden, severe neck pain. If this happens, stop exercising right away. Do not do the exercises again unless your   health care provider says that you can. Exercises Neck Stretch  Repeat this exercise 3-5 times. 1. Do this exercise while standing or while sitting in a chair. 2. Place your feet flat on the floor, shoulder-width apart. 3. Slowly turn your head to the right. Turn it all the way to the right so you can look over your right shoulder. Do not tilt or tip your head. 4. Hold this position for 10-30 seconds. 5. Slowly turn your head to the left, to look over your left shoulder. 6. Hold this position for 10-30 seconds.  Neck Retraction Repeat this exercise 8-10 times. Do this 3-4 times a day or as told by your health care  provider. 1. Do this exercise while standing or while sitting in a sturdy chair. 2. Look straight ahead. Do not bend your neck. 3. Use your fingers to push your chin backward. Do not bend your neck for this movement. Continue to face straight ahead. If you are doing the exercise properly, you will feel a slight sensation in your throat and a stretch at the back of your neck. 4. Hold the stretch for 1-2 seconds. Relax and repeat.  This information is not intended to replace advice given to you by your health care provider. Make sure you discuss any questions you have with your health care provider. Document Released: 02/14/2015 Document Revised: 08/11/2015 Document Reviewed: 09/13/2014 Elsevier Interactive Patient Education  2018 [employer]  

## 2017-09-10 NOTE — Assessment & Plan Note (Signed)
Symptomatic and worsening. MRI cervical spine reviewed again and discussed with her. I will refer to spine specialist. Continue NSAID as needed. Home neck exercise recommended.

## 2017-09-10 NOTE — Progress Notes (Signed)
Subjective:     Patient ID: [name], female   DOB: 05-02-1960, 57 y.o.   MRN: [id number]  Neck Pain   This is a chronic problem. The current episode started more than 1 month ago. The problem occurs constantly. Progression since onset: Pain on the left side of her neck up to the left side of her head. She also feels a knot in her neck. The pain is associated with an unknown factor. The pain is present in the left side. The quality of the pain is described as aching. The pain is at a severity of 7/10. The pain is moderate. The symptoms are aggravated by position and twisting. The pain is same all the time. Stiffness is present: No neck stiffness. Associated symptoms include headaches and a visual change. Pertinent negatives include no chest pain, fever, leg pain, pain with swallowing, trouble swallowing or weakness. Associated symptoms comments: Blurry vision, last seen by ophthalmologist Feb 2019 and was told exam was fine. She has tried NSAIDs and heat (Aleeve for the neck pain and Indocin for the headache) for the symptoms. The treatment provided moderate relief.  Rash  This is a new problem. Episode onset: Couple of months. The problem has been gradually worsening since onset. Location: Lower and mid back centrally. The rash is characterized by dryness and itchiness. She was exposed to nothing. Pertinent negatives include no fever. Treatments tried: Betamethasone. The treatment provided no relief.  Foot pain: Right ankle pain, started 3 weeks ago, no injury. Pain is about 6-7/10 in severity. Improve with rest and aggravated by weight bearing. Some ankle swelling on and off.  Current Outpatient Medications on File Prior to Visit  Medication Sig Dispense Refill  . betamethasone valerate (VALISONE) 0.1 % cream Apply topically 2 (two) times daily. 30 g 0  . cyclobenzaprine (FLEXERIL) 10 MG tablet Take 1 tablet (10 mg total) by mouth 3 (three) times daily as needed for muscle spasms. 15 tablet 0   . estradiol (ESTRACE) 1 MG tablet estradiol 1 mg tablet  TAKE ONE TABLET BY MOUTH TWICE DAILY    . glucose blood (ACCU-CHEK AVIVA) test strip Use as instructed to check sugar one time daily 100 each 5  . indomethacin (INDOCIN) 25 MG capsule indomethacin 25 mg capsule  TAKE 1 CAPSULE BY MOUTH 3 TIMES DAILY WITH FOOD FOR HEMICRANIA CONTINUA    . metFORMIN (GLUCOPHAGE-XR) 500 MG 24 hr tablet TAKE 1 TABLET BY MOUTH DAILY WITH A MEAL. 180 tablet 3  . phentermine 37.5 MG capsule Take 1 capsule (37.5 mg total) by mouth every morning. 30 capsule 0  . Pitavastatin Calcium (LIVALO) 2 MG TABS Use 1x a day 90 tablet 3  . tolterodine (DETROL LA) 4 MG 24 hr capsule Take 1 capsule (4 mg total) by mouth daily. 30 capsule 5  . Vitamin D, Ergocalciferol, (DRISDOL) 50000 units CAPS capsule Take 1 capsule (50,000 Units total) by mouth every 7 (seven) days. 12 capsule 3   No current facility-administered medications on file prior to visit.    Past Medical History:  Diagnosis Date  . Anxiety   . Arthritis   . Brain tumor (benign) ([place]) 2018  . Chest pain 05/15/2015  . Depression   . Diabetes mellitus without complication ([place])   . GERD (gastroesophageal reflux disease)   . H/O varicella   . History of measles, mumps, or rubella   . HSV-2 (herpes simplex virus 2) infection   . Hx of ovarian cyst   . Hyperlipidemia   .  Migraine headache   . Sleep apnea    pt denies  . Sleep deprivation    loss of sleep  . VAIN I (vaginal intraepithelial neoplasia grade I) 2013   pap and confirmed by colposcopic biopsy   Vitals:   09/10/17 0834  BP: 118/68  Pulse: 70  Temp: 98.5 F (36.9 C)  TempSrc: Oral  SpO2: 99%  Weight: 196 lb (88.9 kg)  Height: 5\' 5"  (1.651 m)    Review of Systems  Constitutional: Negative.  Negative for fever.  HENT: Negative for trouble swallowing.   Respiratory: Negative.   Cardiovascular: Negative.  Negative for chest pain.  Gastrointestinal: Negative.   Musculoskeletal:  Positive for neck pain.       Neck pain  Skin: Positive for rash.  Neurological: Positive for headaches. Negative for weakness.       Objective:   Physical Exam  Constitutional: She is oriented to person, place, and time. She appears well-developed. No distress.  Neck: Normal range of motion. No neck rigidity. No edema present. No thyroid mass present.    Cardiovascular: Normal rate, regular rhythm and normal heart sounds.  No murmur heard. Pulmonary/Chest: Effort normal. No respiratory distress.  Abdominal: Soft. Bowel sounds are normal. She exhibits no distension and no mass. There is no tenderness.  Neurological: She is alert and oriented to person, place, and time. She has normal strength. No cranial nerve deficit or sensory deficit. She displays a negative Romberg sign.  Skin:     Nursing note and vitals reviewed.        Assessment:     Cervicalgia Skin rash Right ankle pain    Plan:     Check problem list.

## 2017-09-10 NOTE — Assessment & Plan Note (Signed)
Likely simple sprain. Use NSAID as needed. Monitor for improvement.

## 2017-09-12 ENCOUNTER — Ambulatory Visit (INDEPENDENT_AMBULATORY_CARE_PROVIDER_SITE_OTHER): Payer: No Typology Code available for payment source

## 2017-09-12 DIAGNOSIS — E538 Deficiency of other specified B group vitamins: Secondary | ICD-10-CM | POA: Diagnosis not present

## 2017-09-12 MED ORDER — CYANOCOBALAMIN 1000 MCG/ML IJ SOLN
1000.0000 ug | Freq: Once | INTRAMUSCULAR | Status: AC
Start: 2017-09-12 — End: 2017-09-12
  Administered 2017-09-12: 1000 ug via INTRAMUSCULAR

## 2017-09-12 NOTE — Progress Notes (Signed)
Per orders of Dr Cruzita Lederer injection of B12 given today by Lolita Rieger RMA . Patient tolerated injection well.

## 2017-09-17 ENCOUNTER — Ambulatory Visit (INDEPENDENT_AMBULATORY_CARE_PROVIDER_SITE_OTHER): Payer: No Typology Code available for payment source | Admitting: Family Medicine

## 2017-09-17 ENCOUNTER — Encounter: Payer: Self-pay | Admitting: Family Medicine

## 2017-09-17 ENCOUNTER — Other Ambulatory Visit: Payer: Self-pay

## 2017-09-17 ENCOUNTER — Ambulatory Visit: Payer: Self-pay

## 2017-09-17 VITALS — BP 118/80 | HR 93 | Temp 98.2°F | Wt 198.0 lb

## 2017-09-17 DIAGNOSIS — L989 Disorder of the skin and subcutaneous tissue, unspecified: Secondary | ICD-10-CM | POA: Diagnosis not present

## 2017-09-17 DIAGNOSIS — H538 Other visual disturbances: Secondary | ICD-10-CM

## 2017-09-17 NOTE — Progress Notes (Signed)
Patient ID: [name], female   DOB: [birth year], 57 y.o.   MRN: [id number] Punch Biopsy Procedure Note  Pre-operative Diagnosis: Skin lesion on her back  Post-operative Diagnosis: same  Locations:Lower  lumbar region  Indications: Chronic skin lesion  Anesthesia: Lidocaine 1% with epinephrine without added sodium bicarbonate  Procedure Details  History of allergy to iodine: no Patient informed of the risks (including bleeding and infection) and benefits of the  procedure and both verbal and written informed consent informed consent obtained.  The lesion and surrounding area was given a sterile prep using Alcohol and betadine swab and draped in the usual sterile fashion. The skin was then stretched perpendicular to the skin tension lines and the lesion removed using the 68mm punch.Antibiotic ointment and a sterile dressing applied. The specimen was sent for pathologic examination. The patient tolerated the procedure well.  EBL: <1 ml  Findings: Pending pathology  Condition: Stable  Complications: none.  Plan: 1. Instructed to keep the wound dry and covered for 24-48h and clean thereafter. 2. Warning signs of infection were reviewed.   3. Recommended that the patient use OTC ibuprofen as needed for pain.  4. I will call her with the result.  Of note, she mentioned occasional blurry vision and floaters. Blurry vision occurs with or without headache. However, she will only get floaters with headaches. Currently asymptomatic. I recommended that she contacts her Ophthalmology office today for an appointment. ED visit if persistent or worsening. She agreed with the plan.

## 2017-09-17 NOTE — Patient Instructions (Addendum)

## 2017-09-23 ENCOUNTER — Telehealth: Payer: Self-pay | Admitting: Family Medicine

## 2017-09-23 MED ORDER — HALOBETASOL PROPIONATE 0.05 % EX OINT: TOPICAL_OINTMENT | Freq: Two times a day (BID) | CUTANEOUS | 1 refills | Status: DC

## 2017-09-23 MED FILL — HALOBETASOL PROP 0.05% OINT: 0.05 | 30 days supply | Qty: 50 | Fill #0

## 2017-09-23 NOTE — Telephone Encounter (Signed)
Pt called nurse line returning a call from PCP.

## 2017-09-23 NOTE — Telephone Encounter (Signed)
Biopsy result and causes discussed with patient. High potency steroid recommended since she failed treatment with medium potency Betamethasone.Concern about high CBG with topical steroid raised, I advised her that this is less likely, however, if it happens to let me know.  All other questions were answered.  She agreed with plan and recommendation.

## 2017-09-23 NOTE — Addendum Note (Signed)
Addended by: Andrena Mews T on: 09/23/2017 09:56 AM   Modules accepted: Orders

## 2017-09-23 NOTE — Telephone Encounter (Signed)
HIPAA compliant call back message left.  Will discuss biopsy result with her when she returns my call.

## 2017-10-08 ENCOUNTER — Other Ambulatory Visit (HOSPITAL_COMMUNITY): Payer: Self-pay | Admitting: Neurosurgery

## 2017-10-08 DIAGNOSIS — M4802 Spinal stenosis, cervical region: Secondary | ICD-10-CM

## 2017-10-10 ENCOUNTER — Ambulatory Visit (INDEPENDENT_AMBULATORY_CARE_PROVIDER_SITE_OTHER): Payer: No Typology Code available for payment source

## 2017-10-10 DIAGNOSIS — E538 Deficiency of other specified B group vitamins: Secondary | ICD-10-CM

## 2017-10-10 MED ORDER — CYANOCOBALAMIN 1000 MCG/ML IJ SOLN
1000.0000 ug | Freq: Once | INTRAMUSCULAR | Status: AC
  Administered 2017-10-10: 1000 ug via INTRAMUSCULAR

## 2017-10-10 NOTE — Progress Notes (Signed)
Per orders of Dr. Cruzita Lederer injection of B12 given today by Lolita Rieger RMA . Patient tolerated injection well.

## 2017-10-12 ENCOUNTER — Ambulatory Visit (HOSPITAL_COMMUNITY)
Admission: RE | Admit: 2017-10-12 | Discharge: 2017-10-12 | Disposition: A | Discharge status: Home / Self Care | Payer: No Typology Code available for payment source | Source: Ambulatory Visit | Attending: Neurosurgery | Admitting: Neurosurgery

## 2017-10-12 DIAGNOSIS — M4802 Spinal stenosis, cervical region: Secondary | ICD-10-CM | POA: Insufficient documentation

## 2017-10-12 DIAGNOSIS — M50322 Other cervical disc degeneration at C5-C6 level: Secondary | ICD-10-CM | POA: Insufficient documentation

## 2017-10-16 ENCOUNTER — Other Ambulatory Visit: Payer: Self-pay | Admitting: Family Medicine

## 2017-10-16 ENCOUNTER — Telehealth: Payer: Self-pay | Admitting: Family Medicine

## 2017-10-16 DIAGNOSIS — E118 Type 2 diabetes mellitus with unspecified complications: Secondary | ICD-10-CM

## 2017-10-16 NOTE — Telephone Encounter (Signed)
Pt called and because she needs a referral to be see. opthalmology . jw

## 2017-10-16 NOTE — Progress Notes (Signed)
Referral placed.  Dorna Bloom, CMA routed conversation to You 17 minutes ago (11:59 AM)    [name] routed conversation to [employer] 21 minutes ago (11:55 AM)    [name] 24 minutes ago (11:52 AM)      Pt called and because she needs a referral to be see. opthalmology . jw       Documentation     [name], [name] "Pam" [phone]  [name]

## 2017-10-16 NOTE — Telephone Encounter (Signed)
Referral order placed.

## 2017-11-19 ENCOUNTER — Other Ambulatory Visit: Payer: Self-pay | Admitting: Obstetrics and Gynecology

## 2017-11-19 DIAGNOSIS — Z1231 Encounter for screening mammogram for malignant neoplasm of breast: Secondary | ICD-10-CM

## 2017-11-29 ENCOUNTER — Encounter (INDEPENDENT_AMBULATORY_CARE_PROVIDER_SITE_OTHER): Payer: No Typology Code available for payment source | Admitting: Neurology

## 2017-11-29 ENCOUNTER — Encounter: Payer: Self-pay | Admitting: Family Medicine

## 2017-11-29 ENCOUNTER — Telehealth: Payer: Self-pay | Admitting: Obstetrics and Gynecology

## 2017-11-29 ENCOUNTER — Encounter: Payer: Self-pay | Admitting: Obstetrics and Gynecology

## 2017-11-29 ENCOUNTER — Ambulatory Visit (INDEPENDENT_AMBULATORY_CARE_PROVIDER_SITE_OTHER): Payer: No Typology Code available for payment source | Admitting: Neurology

## 2017-11-29 ENCOUNTER — Ambulatory Visit: Payer: Self-pay | Admitting: Obstetrics and Gynecology

## 2017-11-29 DIAGNOSIS — M542 Cervicalgia: Secondary | ICD-10-CM

## 2017-11-29 DIAGNOSIS — G8929 Other chronic pain: Secondary | ICD-10-CM

## 2017-11-29 DIAGNOSIS — M25511 Pain in right shoulder: Secondary | ICD-10-CM

## 2017-11-29 DIAGNOSIS — Z0289 Encounter for other administrative examinations: Secondary | ICD-10-CM

## 2017-11-29 DIAGNOSIS — M5010 Cervical disc disorder with radiculopathy, unspecified cervical region: Secondary | ICD-10-CM

## 2017-11-29 NOTE — Progress Notes (Deleted)
57 y.o. [id number] Married [place] American female here for annual exam.    PCP:     No LMP recorded (lmp unknown). Patient has had a hysterectomy.           Sexually active: {yes no:314532}  The current method of family planning is {contraception:315051}.    Exercising: {yes no:314532}  {types:19826} Smoker:  {YES [id number]  Health Maintenance: Pap:  11/12/2016 normal History of abnormal Pap:  {YES NO:22349} MMG:  12/11/2016 BI-RADS CATEGORY  1: Negative. Colonoscopy:  2014 BMD:   n/a  Result  n/a TD 2011 Gardasil:   {YES NO:22349} HIV: Hep C: Screening Labs:  Hb today: ***, Urine today: ***   reports that she has quit smoking. Her smoking use included cigarettes. She smoked 0.50 packs per day. She has never used smokeless tobacco. She reports that she does not drink alcohol or use drugs.  Past Medical History:  Diagnosis Date  . Anxiety   . Arthritis   . Brain tumor (benign) ([place]) 2018  . Chest pain 05/15/2015  . Depression   . Diabetes mellitus without complication ([place])   . GERD (gastroesophageal reflux disease)   . H/O varicella   . History of measles, mumps, or rubella   . HSV-2 (herpes simplex virus 2) infection   . Hx of ovarian cyst   . Hyperlipidemia   . Migraine headache   . Sleep apnea    pt denies  . Sleep deprivation    loss of sleep  . VAIN I (vaginal intraepithelial neoplasia grade I) 2013   pap and confirmed by colposcopic biopsy    Past Surgical History:  Procedure Laterality Date  . ABDOMINAL HYSTERECTOMY    . CHOLECYSTECTOMY  11/13/10  . COLONOSCOPY  Multiple  . DILATION AND CURETTAGE OF UTERUS    . ESOPHAGOGASTRODUODENOSCOPY    . OOPHORECTOMY  2009   bilateral salpingo-oophorectomy.  . TUBAL LIGATION      Current Outpatient Medications  Medication Sig Dispense Refill  . betamethasone valerate (VALISONE) 0.1 % cream Apply topically 2 (two) times daily. (Patient not taking: Reported on 09/10/2017) 30 g 0  . cyclobenzaprine (FLEXERIL) 10 MG  tablet Take 1 tablet (10 mg total) by mouth 3 (three) times daily as needed for muscle spasms. (Patient not taking: Reported on 09/10/2017) 15 tablet 0  . estradiol (ESTRACE) 1 MG tablet estradiol 1 mg tablet  TAKE ONE TABLET BY MOUTH TWICE DAILY    . glucose blood (ACCU-CHEK AVIVA) test strip Use as instructed to check sugar one time daily 100 each 5  . halobetasol (ULTRAVATE) 0.05 % ointment Apply topically 2 (two) times daily. 1 Tube 1  . indomethacin (INDOCIN) 25 MG capsule indomethacin 25 mg capsule  TAKE 1 CAPSULE BY MOUTH 3 TIMES DAILY  AS NEEDED WITH FOOD FOR HEMICRANIA CONTINUA    . metFORMIN (GLUCOPHAGE-XR) 500 MG 24 hr tablet TAKE 1 TABLET BY MOUTH DAILY WITH A MEAL. 180 tablet 3  . phentermine 37.5 MG capsule Take 1 capsule (37.5 mg total) by mouth every morning. 30 capsule 0  . Pitavastatin Calcium (LIVALO) 2 MG TABS Use 1x a day 90 tablet 3  . tolterodine (DETROL LA) 4 MG 24 hr capsule Take 1 capsule (4 mg total) by mouth daily. 30 capsule 5  . Vitamin D, Ergocalciferol, (DRISDOL) 50000 units CAPS capsule Take 1 capsule (50,000 Units total) by mouth every 7 (seven) days. 12 capsule 3   No current facility-administered medications for this visit.     Family  History  Problem Relation Age of Onset  . Hypertension Mother   . Hypotension Mother   . Anemia Mother        low iron  . Heart disease Sister   . Sickle cell trait Other   . Alcohol abuse Brother   . Alcohol abuse Brother   . Drug abuse Brother   . Colon cancer Neg Hx   . Esophageal cancer Neg Hx   . Stomach cancer Neg Hx     Review of Systems  Constitutional: Negative.   HENT: Negative.   Eyes: Negative.   Respiratory: Negative.   Cardiovascular: Negative.   Gastrointestinal: Negative.   Endocrine: Negative.   Genitourinary: Negative.   Musculoskeletal: Negative.   Skin: Negative.   Allergic/Immunologic: Negative.   Neurological: Negative.   Hematological: Negative.   Psychiatric/Behavioral: Negative.    All other systems reviewed and are negative.   Exam:   LMP  (LMP Unknown)     General appearance: alert, cooperative and appears stated age Head: Normocephalic, without obvious abnormality, atraumatic Neck: no adenopathy, supple, symmetrical, trachea midline and thyroid normal to inspection and palpation Lungs: clear to auscultation bilaterally Breasts: normal appearance, no masses or tenderness, No nipple retraction or dimpling, No nipple discharge or bleeding, No axillary or supraclavicular adenopathy Heart: regular rate and rhythm Abdomen: soft, non-tender; no masses, no organomegaly Extremities: extremities normal, atraumatic, no cyanosis or edema Skin: Skin color, texture, turgor normal. No rashes or lesions Lymph nodes: Cervical, supraclavicular, and axillary nodes normal. No abnormal inguinal nodes palpated Neurologic: Grossly normal  Pelvic: External genitalia:  no lesions              Urethra:  normal appearing urethra with no masses, tenderness or lesions              Bartholins and Skenes: normal                 Vagina: normal appearing vagina with normal color and discharge, no lesions              Cervix: no lesions              Pap taken: {yes no:314532} Bimanual Exam:  Uterus:  normal size, contour, position, consistency, mobility, non-tender              Adnexa: no mass, fullness, tenderness              Rectal exam: {yes no:314532}.  Confirms.              Anus:  normal sphincter tone, no lesions  Chaperone was present for exam.  Assessment:   Well woman visit with normal exam.   Plan: Mammogram screening. Recommended self breast awareness. Pap and HR HPV as above. Guidelines for Calcium, Vitamin D, regular exercise program including cardiovascular and weight bearing exercise.   Follow up annually and prn.   Additional counseling given.  {yes [id number]. _______ minutes face to face time of which over 50% was spent in counseling.    After visit  summary provided.

## 2017-11-29 NOTE — Telephone Encounter (Signed)
Thank you for the update!

## 2017-11-29 NOTE — Telephone Encounter (Signed)
Patient rescheduled today's 8:30 appointment to 12/20/2017. Could not make it to the appointment this morning due to being stuck in traffic.

## 2017-11-29 NOTE — Procedures (Signed)
Full Name: Sharon Michna" [name] Gender: Female MRN #: [id number] Date of Birth: 1961-01-07    Visit Date: 11/29/2017 10:56 Age: 57 Years 6 Months Old Examining Physician: Marcial Pacas, MD  Referring Physician: Loreli Dollar, MD History: 57 year old female, presented with few months history of imbalance sensation.  Also complains of right arm shoulder pain  Summary of the tests:  Nerve conduction study: Right sural, superficial peroneal, median, ulnar sensory responses were normal.  Right median, ulnar, tibial, peroneal to EDB motor responses were normal.  Electromyography: Selective needle examination of right upper and lower extremity muscles were normal.  Conclusion: This is a normal study.  There is no electrodiagnostic evidence of large fiber peripheral neuropathy, right upper or lower extremity negative.    ------------------------------- Catalina Pizza.D.PhD.  [facility] [place], [place] [address] Tel: [phone] Fax: [phone]        [facility]    Nerve / Sites Muscle Latency Ref. Amplitude Ref. Rel Amp Segments Distance Velocity Ref. Area    ms ms mV mV %  cm m/s m/s mVms  R Median - APB     Wrist APB 3.8 ?4.4 4.5 ?4.0 100 Wrist - APB 7   14.5     Upper arm APB 7.6  4.3  96.6 Upper arm - Wrist 20 53 ?49 14.9  R Ulnar - ADM     Wrist ADM 2.4 ?3.3 8.7 ?6.0 100 Wrist - ADM 7   23.9     B.Elbow ADM 5.4  7.5  86.7 B.Elbow - Wrist 18 62 ?49 21.5     A.Elbow ADM 7.0  8.1  107 A.Elbow - B.Elbow 10 62 ?49 23.9         A.Elbow - Wrist      R Peroneal - EDB     Ankle EDB 3.9 ?6.5 3.9 ?2.0 100 Ankle - EDB 9   13.2     Fib head EDB 9.9  3.6  92.3 Fib head - Ankle 32 53 ?44 11.9     Pop fossa EDB 12.0  3.7  104 Pop fossa - Fib head 10 49 ?44 12.8         Pop fossa - Ankle      R Tibial - AH     Ankle AH 4.6 ?5.8 6.0 ?4.0 100 Ankle - AH 9   12.5     Pop fossa AH 12.6  3.9  65.6 Pop fossa - Ankle 38 47 ?41 12.1             SNC    Nerve /  Sites Rec. Site Peak Lat Ref.  Amp Ref. Segments Distance    ms ms V V  cm  R Sural - Ankle (Calf)     Calf Ankle 4.0 ?4.4 13 ?6 Calf - Ankle 14  R Superficial peroneal - Ankle     Lat leg Ankle 3.6 ?4.4 8 ?6 Lat leg - Ankle 14  R Median - Orthodromic (Dig II, Mid palm)     Dig II Wrist 3.1 ?3.4 13 ?10 Dig II - Wrist 13  R Ulnar - Orthodromic, (Dig V, Mid palm)     Dig V Wrist 2.7 ?3.1 5 ?5 Dig V - Wrist 58              F  Wave    Nerve F Lat Ref.   ms ms  R Tibial - AH 48.6 ?56.0  R Ulnar -  ADM 26.3 ?32.0         EMG full       EMG Summary Table    Spontaneous MUAP Recruitment  Muscle IA Fib PSW Fasc Other Amp Dur. Poly Pattern  R. Tibialis anterior Normal None None None _______ Normal Normal Normal Normal  R. Tibialis posterior Normal None None None _______ Normal Normal Normal Normal  R. Gastrocnemius (Medial head) Normal None None None _______ Normal Normal Normal Normal  R. Peroneus longus Normal None None None _______ Normal Normal Normal Normal  R. Vastus lateralis Normal None None None _______ Normal Normal Normal Normal  R. Biceps femoris (long head) Normal None None None _______ Normal Normal Normal Normal  R. First dorsal interosseous Normal None None None _______ Normal Normal Normal Normal  R. Pronator teres Normal None None None _______ Normal Normal Normal Normal  R. Biceps brachii Normal None None None _______ Normal Normal Normal Normal  R. Deltoid Normal None None None _______ Normal Normal Normal Normal  R. Triceps brachii Normal None None None _______ Normal Normal Normal Normal

## 2017-12-13 ENCOUNTER — Ambulatory Visit
Admission: RE | Admit: 2017-12-13 | Discharge: 2017-12-13 | Disposition: A | Discharge status: Home / Self Care | Payer: No Typology Code available for payment source | Source: Ambulatory Visit | Attending: Obstetrics and Gynecology | Admitting: Obstetrics and Gynecology

## 2017-12-13 DIAGNOSIS — Z1231 Encounter for screening mammogram for malignant neoplasm of breast: Secondary | ICD-10-CM

## 2017-12-17 NOTE — Progress Notes (Signed)
57 y.o. [id number] Married [place] American female here for annual exam.    Developed some shooting pain in the right breast following her mammogram done 12/13/17.  Had a flu shot in the right arm.   Taking Detrol LA and her urinary control is better.  She wants to continue. Dry mouth.  Myrbetriq did not work as well and it was very expensive.  A1C is 6.3.  Wants weight loss medication.  Did go to [facility].   PCP:   Dr. Gwendlyn Deutscher  No LMP recorded (lmp unknown). Patient has had a hysterectomy.     Period Cycle (Days): (hysterectomy)     Sexually active: Yes.    The current method of family planning is status post hysterectomy.    Exercising: Yes.    Home exercise routine includes walking.   Smoker:  no  Health Maintenance: Pap:  11/12/2016 normal History of abnormal Pap:  Years ago per patient MMG:  12/13/2017 BI-RADS CATEGORY  1: Negative. Colonoscopy:  2014 [facility], follow up in 10 years BMD:   n/a  Result  n/a TD 2011 Gardasil:   no HIV: Not Detected 2015 Hep C: Negative 11/2016 Screening Labs:  ----   reports that she has quit smoking. Her smoking use included cigarettes. She smoked 0.50 packs per day. She has never used smokeless tobacco. She reports that she does not drink alcohol or use drugs.  Past Medical History:  Diagnosis Date  . Anxiety   . Arthritis   . Brain tumor (benign) ([place]) 2018  . Chest pain 05/15/2015  . Depression   . Diabetes mellitus without complication ([place])   . GERD (gastroesophageal reflux disease)   . H/O varicella   . History of measles, mumps, or rubella   . HSV-2 (herpes simplex virus 2) infection   . Hx of ovarian cyst   . Hyperlipidemia   . Migraine headache   . Plantar fasciitis   . Sleep apnea    pt denies  . Sleep deprivation    loss of sleep  . VAIN I (vaginal intraepithelial neoplasia grade I) 2013   pap and confirmed by colposcopic biopsy    Past Surgical History:  Procedure Laterality Date  .  ABDOMINAL HYSTERECTOMY    . CHOLECYSTECTOMY  11/13/10  . COLONOSCOPY  Multiple  . DILATION AND CURETTAGE OF UTERUS    . ESOPHAGOGASTRODUODENOSCOPY    . OOPHORECTOMY  2009   bilateral salpingo-oophorectomy.  . TUBAL LIGATION      Current Outpatient Medications  Medication Sig Dispense Refill  . cyclobenzaprine (FLEXERIL) 10 MG tablet Take 1 tablet (10 mg total) by mouth 3 (three) times daily as needed for muscle spasms. 15 tablet 0  . estradiol (ESTRACE) 1 MG tablet estradiol 1 mg tablet  TAKE ONE TABLET BY MOUTH TWICE DAILY    . glucose blood (ACCU-CHEK AVIVA) test strip Use as instructed to check sugar one time daily 100 each 5  . indomethacin (INDOCIN) 25 MG capsule indomethacin 25 mg capsule  TAKE 1 CAPSULE BY MOUTH 3 TIMES DAILY  AS NEEDED WITH FOOD FOR HEMICRANIA CONTINUA    . metFORMIN (GLUCOPHAGE-XR) 500 MG 24 hr tablet TAKE 1 TABLET BY MOUTH DAILY WITH A MEAL. 180 tablet 3  . phentermine 37.5 MG capsule Take 1 capsule (37.5 mg total) by mouth every morning. 30 capsule 0  . Pitavastatin Calcium (LIVALO) 2 MG TABS Use 1x a day 90 tablet 3  . tolterodine (DETROL LA) 4 MG 24 hr capsule Take  1 capsule (4 mg total) by mouth daily. 30 capsule 5  . Vitamin D, Ergocalciferol, (DRISDOL) 50000 units CAPS capsule Take 1 capsule (50,000 Units total) by mouth every 7 (seven) days. 12 capsule 3   No current facility-administered medications for this visit.     Family History  Problem Relation Age of Onset  . Hypertension Mother   . Hypotension Mother   . Anemia Mother        low iron  . Heart disease Sister   . Sickle cell trait Other   . Alcohol abuse Brother   . Alcohol abuse Brother   . Drug abuse Brother   . Colon cancer Neg Hx   . Esophageal cancer Neg Hx   . Stomach cancer Neg Hx   . Breast cancer Neg Hx     Review of Systems  Constitutional: Negative.   HENT: Negative.   Eyes: Negative.   Respiratory: Negative.   Cardiovascular: Negative.   Gastrointestinal: Negative.    Endocrine: Negative.   Genitourinary: Negative.   Musculoskeletal: Negative.   Skin: Negative.   Allergic/Immunologic: Negative.   Neurological: Negative.   Hematological: Negative.   Psychiatric/Behavioral: Negative.   All other systems reviewed and are negative.   Exam:   BP 118/78   Pulse 90   Ht 5\' 4"  (1.626 m)   Wt 201 lb (91.2 kg)   LMP  (LMP Unknown)   SpO2 97%   BMI 34.50 kg/m     General appearance: alert, cooperative and appears stated age Head: Normocephalic, without obvious abnormality, atraumatic Neck: no adenopathy, supple, symmetrical, trachea midline and thyroid normal to inspection and palpation Lungs: clear to auscultation bilaterally Breasts: normal appearance, no masses or tenderness, No nipple retraction or dimpling, No nipple discharge or bleeding, No axillary or supraclavicular adenopathy Heart: regular rate and rhythm Abdomen: soft, non-tender; no masses, no organomegaly Extremities: extremities normal, atraumatic, no cyanosis or edema Skin: Skin color, texture, turgor normal. No rashes or lesions Lymph nodes: Cervical, supraclavicular, and axillary nodes normal. No abnormal inguinal nodes palpated Neurologic: Grossly normal  Pelvic: External genitalia:  no lesions              Urethra:  normal appearing urethra with no masses, tenderness or lesions              Bartholins and Skenes: normal                 Vagina: normal appearing vagina with normal color and discharge, no lesions              Cervix:  absent              Pap taken: No. Bimanual Exam:  Uterus:   absent              Adnexa: no mass, fullness, tenderness              Rectal exam: Yes.  .  Confirms.              Anus:  normal sphincter tone, no lesions  Chaperone was present for exam.  Assessment:   Well woman visit with normal exam. Status post hysterectomy.  Status post BSO.  Hx VAIN I. Hx HSV 2.  On ERT.  BMI 34.5. Desire for weight loss.  Overactive bladder.   Reassuring breast exam.   Plan: Mammogram screening. Recommended self breast awareness. Pap and HR HPV as above. Guidelines for Calcium, Vitamin D, regular exercise program including cardiovascular  and weight bearing exercise. Estrace 1 mg po bid.  She will try to wean down to 1 mg daily.  We discusssed WHI and increased risk of stroke, PE, and DVT.  Refill of Phentermine.  [facility]. Detrol LA 4 mg daily.  Return if breast pain is persistent. Follow up annually and prn.     After visit summary provided.

## 2017-12-18 ENCOUNTER — Ambulatory Visit: Payer: No Typology Code available for payment source | Admitting: Obstetrics and Gynecology

## 2017-12-19 ENCOUNTER — Encounter

## 2017-12-19 ENCOUNTER — Institutional Professional Consult (permissible substitution): Payer: No Typology Code available for payment source | Admitting: Neurology

## 2017-12-20 ENCOUNTER — Encounter: Payer: Self-pay | Admitting: Obstetrics and Gynecology

## 2017-12-20 ENCOUNTER — Other Ambulatory Visit: Payer: Self-pay | Admitting: Internal Medicine

## 2017-12-20 ENCOUNTER — Ambulatory Visit (INDEPENDENT_AMBULATORY_CARE_PROVIDER_SITE_OTHER): Payer: No Typology Code available for payment source | Admitting: Obstetrics and Gynecology

## 2017-12-20 VITALS — BP 118/78 | HR 90 | Ht 64.0 in | Wt 201.0 lb

## 2017-12-20 DIAGNOSIS — Z01419 Encounter for gynecological examination (general) (routine) without abnormal findings: Secondary | ICD-10-CM

## 2017-12-20 MED ORDER — ESTRADIOL 1 MG PO TABS: ORAL_TABLET | ORAL | 11 refills | Status: DC

## 2017-12-20 MED ORDER — TOLTERODINE TARTRATE ER 4 MG PO CP24: 4.0000 mg | ORAL_CAPSULE | Freq: Every day | ORAL | 11 refills | Status: DC

## 2017-12-20 MED ORDER — PHENTERMINE HCL 37.5 MG PO CAPS: 37.5000 mg | ORAL_CAPSULE | ORAL | 0 refills | Status: DC

## 2017-12-20 MED FILL — TOLTERODINE TART ER 4 MG CA: 4 | 30 days supply | Qty: 30 | Fill #0

## 2017-12-20 MED FILL — PHENTERMINE 37.5 MG TABLET: 37.5 | 30 days supply | Qty: 30 | Fill #0

## 2017-12-20 MED FILL — ESTRADIOL 1 MG TABLET: 1 | 30 days supply | Qty: 60 | Fill #0

## 2017-12-20 MED FILL — ACCU-CHEK FASTCLIX LANCETS: 90 days supply | Qty: 102 | Fill #0

## 2017-12-20 MED FILL — ACCU-CHEK GUIDE STRP: 90 days supply | Qty: 100 | Fill #0

## 2017-12-20 NOTE — Patient Instructions (Signed)

## 2017-12-26 MED FILL — OFLOXACIN 0.3% EYE DROPS: 0.3 | 8 days supply | Qty: 5 | Fill #0

## 2017-12-30 ENCOUNTER — Ambulatory Visit (INDEPENDENT_AMBULATORY_CARE_PROVIDER_SITE_OTHER): Payer: No Typology Code available for payment source | Admitting: Internal Medicine

## 2017-12-30 ENCOUNTER — Encounter: Payer: Self-pay | Admitting: Internal Medicine

## 2017-12-30 VITALS — BP 110/72 | HR 95 | Ht 64.0 in | Wt 198.0 lb

## 2017-12-30 DIAGNOSIS — E1165 Type 2 diabetes mellitus with hyperglycemia: Secondary | ICD-10-CM

## 2017-12-30 DIAGNOSIS — E559 Vitamin D deficiency, unspecified: Secondary | ICD-10-CM

## 2017-12-30 DIAGNOSIS — E538 Deficiency of other specified B group vitamins: Secondary | ICD-10-CM | POA: Diagnosis not present

## 2017-12-30 DIAGNOSIS — E785 Hyperlipidemia, unspecified: Secondary | ICD-10-CM

## 2017-12-30 DIAGNOSIS — R7989 Other specified abnormal findings of blood chemistry: Secondary | ICD-10-CM

## 2017-12-30 LAB — POCT GLYCOSYLATED HEMOGLOBIN (HGB A1C): Hemoglobin A1C: 6.2 % — AB (ref 4.0–5.6)

## 2017-12-30 LAB — LIPID PANEL
Cholesterol: 231 mg/dL — ABNORMAL HIGH (ref 0–200)
HDL: 47.6 mg/dL (ref >39.00)
LDL Cholesterol: 164 mg/dL — ABNORMAL HIGH (ref 0–99)
NonHDL: 183.77
Total CHOL/HDL Ratio: 5
Triglycerides: 101 mg/dL (ref 0.0–149.0)
VLDL: 20.2 mg/dL (ref 0.0–40.0)

## 2017-12-30 LAB — VITAMIN B12: Vitamin B-12: 390 pg/mL (ref 211–911)

## 2017-12-30 LAB — VITAMIN D 25 HYDROXY (VIT D DEFICIENCY, FRACTURES): VITD: 32.81 ng/mL (ref 30.00–100.00)

## 2017-12-30 MED ORDER — METFORMIN HCL ER 500 MG PO TB24: ORAL_TABLET | ORAL | 3 refills | Status: DC

## 2017-12-30 MED ORDER — CYANOCOBALAMIN 1000 MCG/ML IJ SOLN
1000.0000 ug | Freq: Once | INTRAMUSCULAR | Status: AC
  Administered 2017-12-30: 1000 ug via INTRAMUSCULAR

## 2017-12-30 MED FILL — METFORMIN HCL ER 500 MG TAB: 500 | 90 days supply | Qty: 90 | Fill #0

## 2017-12-30 NOTE — Progress Notes (Signed)
Patient ID: [name], female   DOB: 12-25-1960, 57 y.o.   MRN: [id number]  HPI: [name] is a 57 y.o.-year-old female, returning for f/u for DM2; prediabetes dx early 2000s, controlled, without long term complications, hyperlipidemia, subclinical thyrotoxicosis. Also, vitamin D deficiency and low vitamin B12.Last visit 4.5 mo ago.  Last hemoglobin A1c was: Lab Results  Component Value Date   HGBA1C 6.3 (A) 08/15/2017   HGBA1C 6 04/05/2017   HGBA1C 6.2 12/04/2016  She had several steroid inj in the past- last in 04/2014.  She is on: - Metformin ER 500 mg 2x a day >> we had to decrease to once a day 2/2 GI discomfort >> 500 mg with dinner She was previously on: - Tradjenta 5 mg in am, before b'fast  - Invokana 100 mg in am - started 03/2015 >> she had CP, HA, lightheadedness - regular metformin >> diarrhea/nausea with regular metformin.  Pt checks sugars 8x a day: - am:77, 85-95 >> 98-103, 118 >> 88-131 - 2h after b'fast:  102 >> n/c >> 90-125 >> 86-127 - lunch: 95-100 >> 95-100 >> 90s >> n/c >> 87-120 - 2h after lunch:120-145 >> 95-100 >> n/c >> 86-118 - before dinner (snacking):80-100 >> n/c >> 80-127 - 2h after dinner: 120-127 >> up to 135 >> 90-120 - bedtime: 85-98 >> 85-100 >> n/c >> 97-121 - nighttime: 89-116 Lowest sugar was  66 >> 80; it is unclear at which level she has hypoglycemia awareness. Highest: 135 >> 131.  Meter: AccuChek >>  [employer]: - b'fast: oatmeal + cinnamon + raisons; egg + toast + bacon - lunch: grilled chicken salad or sandwich + chips - snack: fruit, crackers + cheese - dinner: chicken, green beans, potato  She reports in the nutrition center upstairs.  -No CKD, last BUN/creatinine:  Lab Results  Component Value Date   BUN 14 07/04/2017   CREATININE 0.92 07/04/2017   - last eye exam was in 05/2017: No DR. Let[facility] K[facility]. Saw Dr Claudean Kinds few days ago >> corneal ulcer. -No numbness and tingling in her  feet  HL: Reviewed the last 3 sets: -Lipids were much worse at last check due to dietary indiscretions and forgetting.  She is now back on this. Lab Results  Component Value Date   CHOL 255 (H) 07/04/2017   CHOL 167 04/19/2016   CHOL 179 04/26/2015   Lab Results  Component Value Date   HDL 42.20 07/04/2017   HDL 44.50 04/19/2016   HDL 42.60 04/26/2015   Lab Results  Component Value Date   LDLCALC 196 (H) 07/04/2017   LDLCALC 102 (H) 04/19/2016   LDLCALC 123 (H) 04/26/2015   Lab Results  Component Value Date   TRIG 84.0 07/04/2017   TRIG 101.0 04/19/2016   TRIG 66.0 04/26/2015   Lab Results  Component Value Date   CHOLHDL 6 07/04/2017   CHOLHDL 4 04/19/2016   CHOLHDL 4 04/26/2015   She has normal LFTs: Lab Results  Component Value Date   ALT 10 07/04/2017   AST 12 07/04/2017   ALKPHOS 79 05/15/2016   BILITOT 0.3 07/04/2017   In spring 2018 that she was complaining of fatigue, we checked her vitamin levels and vitamin B12 and D were low: Lab Results  Component Value Date   VD25OH 23.26 (L) 07/04/2017   VD25OH 17.98 (L) 08/03/2016   VD25OH 9 (L) 05/13/2015   Lab Results  Component Value Date   VITAMINB12 368 07/04/2017   VITAMINB12  315 08/03/2016   VITAMINB12 428 05/13/2015   VITAMINB12 496 01/22/2014   She is on - B12 po 5000 mcg daily >> currently on B12 injections monthly. - Ergocalciferol 50,000 units weekly  Subclinical thyrotoxicosis  -She has a history of slightly low TSH levels, with normal free thyroid hormones, but this normalized in the last year: Lab Results  Component Value Date   TSH 0.42 07/04/2017   TSH 0.97 12/04/2016   TSH 0.34 (L) 08/03/2016   TSH 0.430 (L) 06/08/2016   TSH 0.77 05/13/2015   TSH 0.619 01/22/2014   TSH 0.730 10/14/2013   TSH 0.492 09/01/2012   TSH 0.745 10/05/2011   Lab Results  Component Value Date   FREET4 0.70 07/04/2017   FREET4 0.77 12/04/2016   FREET4 0.79 08/03/2016   T3FREE 3.8 07/04/2017   T3FREE  3.4 12/04/2016   T3FREE 3.6 08/03/2016   She has dysphagia >> had esophageal stretching.  ROS: Constitutional: no weight gain/no weight loss, no fatigue, no subjective hyperthermia, no subjective hypothermia Eyes: no blurry vision, no xerophthalmia ENT: no sore throat, no nodules palpated in throat, no dysphagia, no odynophagia, no hoarseness Cardiovascular: no CP/no SOB/no palpitations/no leg swelling Respiratory: no cough/no SOB/no wheezing Gastrointestinal: no N/no V/no D/no C/no acid reflux Musculoskeletal: no muscle aches/no joint aches Skin: no rashes, no hair loss Neurological: no tremors/no numbness/no tingling/no dizziness  I reviewed pt's medications, allergies, PMH, social hx, family hx, and changes were documented in the history of present illness. Otherwise, unchanged from my initial visit note.  Past Medical History:  Diagnosis Date  . Anxiety   . Arthritis   . Brain tumor (benign) ([place]) 2018  . Chest pain 05/15/2015  . Depression   . Diabetes mellitus without complication ([place])   . GERD (gastroesophageal reflux disease)   . H/O varicella   . History of measles, mumps, or rubella   . HSV-2 (herpes simplex virus 2) infection   . Hx of ovarian cyst   . Hyperlipidemia   . Migraine headache   . Plantar fasciitis   . Sleep apnea    pt denies  . Sleep deprivation    loss of sleep  . 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 ABDOMINAL HYSTERECTOMY    . CHOLECYSTECTOMY  11/13/10  . COLONOSCOPY  Multiple  . DILATION AND CURETTAGE OF UTERUS    . ESOPHAGOGASTRODUODENOSCOPY    . OOPHORECTOMY  2009   bilateral salpingo-oophorectomy.  . TUBAL LIGATION     Social History   Socioeconomic History  . Marital status: Married    Spouse name: Not on file  . Number of children: Not on file  . Years of education: Not on file  . Highest education level: Not on  file  Occupational History  . Not on file  Social Needs  . Financial resource strain: Not on file  . Food insecurity:    Worry: Not on file    Inability: Not on file  . Transportation needs:    Medical: Not on file    Non-medical: Not on file  Tobacco Use  . Smoking status: Former Smoker    Packs/day: 0.50    Types: Cigarettes  . Smokeless tobacco: Never Used  Substance and Sexual Activity  . Alcohol use: No    Alcohol/week: 0.0 standard drinks  . Drug use: No  . Sexual activity: Yes  Partners: Male    Birth control/protection: Surgical    Comment: Hysterectomy  Lifestyle  . Physical activity:    Days per week: Not on file    Minutes per session: Not on file  . Stress: Not on file  Relationships  . Social connections:    Talks on phone: Not on file    Gets together: Not on file    Attends religious service: Not on file    Active member of club or organization: Not on file    Attends meetings of clubs or organizations: Not on file    Relationship status: Not on file  . Intimate partner violence:    Fear of current or ex partner: Not on file    Emotionally abused: Not on file    Physically abused: Not on file    Forced sexual activity: Not on file  Other Topics Concern  . Not on file  Social History Narrative   Patient lives at home with her husband [name]). 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 Two children. Both boys   Current Outpatient Medications on File Prior to Visit  Medication Sig Dispense Refill  . ACCU-CHEK FASTCLIX LANCETS MISC USE AS DIRECTED TO CHECK BLOOD SUGAR ONCE DAILY 102 each 5  . ACCU-CHEK GUIDE test strip USE AS DIRECTED TO CHECK BLOOD SUGAR ONCE DAILY 100 each 5  . cyclobenzaprine (FLEXERIL) 10 MG tablet Take 1 tablet (10 mg total) by mouth 3 (three) times daily as needed for muscle spasms. 15 tablet 0  . estradiol (ESTRACE) 1 MG tablet TAKE ONE TABLET (1 mg) BY  MOUTH TWICE DAILY 60 tablet 11  . indomethacin (INDOCIN) 25 MG capsule indomethacin 25 mg capsule  TAKE 1 CAPSULE BY MOUTH 3 TIMES DAILY  AS NEEDED WITH FOOD FOR HEMICRANIA CONTINUA    . metFORMIN (GLUCOPHAGE-XR) 500 MG 24 hr tablet TAKE 1 TABLET BY MOUTH DAILY WITH A MEAL. 180 tablet 3  . phentermine 37.5 MG capsule Take 1 capsule (37.5 mg total) by mouth every morning. 30 capsule 0  . Pitavastatin Calcium (LIVALO) 2 MG TABS Use 1x a day 90 tablet 3  . tolterodine (DETROL LA) 4 MG 24 hr capsule Take 1 capsule (4 mg total) by mouth daily. 30 capsule 11  . Vitamin D, Ergocalciferol, (DRISDOL) 50000 units CAPS capsule Take 1 capsule (50,000 Units total) by mouth every 7 (seven) days. 12 capsule 3   No current facility-administered medications on file prior to visit.    Allergies  Allergen Reactions  . Sulfa Antibiotics Anaphylaxis, Hives and Swelling   Family History  Problem Relation Age of Onset  . Hypertension Mother   . Hypotension Mother   . Anemia Mother        low iron  . Heart disease Sister   . Sickle cell trait Other   . Alcohol abuse Brother   . Alcohol abuse Brother   . Drug abuse Brother   . Colon cancer Neg Hx   . Esophageal cancer Neg Hx   . Stomach cancer Neg Hx   . Breast cancer Neg Hx     PE: BP 110/72   Pulse 95   Ht 5\' 4"  (1.626 m) Comment: measured  Wt 198 lb (89.8 kg)   LMP  (LMP Unknown)   SpO2 97%   BMI 33.99 kg/m  Body mass index is 33.99 kg/m. Wt Readings from Last 3 Encounters:  12/30/17  198 lb (89.8 kg)  12/20/17 201 lb (91.2 kg)  09/17/17 198 lb (89.8 kg)   Constitutional: overweight, in NAD Eyes: PERRLA, EOMI, no exophthalmos ENT: moist mucous membranes, no thyromegaly, no cervical lymphadenopathy Cardiovascular: tachycardia, RR, No MRG Respiratory: CTA B Gastrointestinal: abdomen soft, NT, ND, BS+ Musculoskeletal: no deformities, strength intact in all 4 Skin: moist, warm, no rashes Neurological: no tremor with outstretched hands,  DTR normal in all 4  ASSESSMENT: 1. DM2, non-insulin dependent, without complications, controlled  2. HL  3. H/o Low TSH  4.  Low vitamin D and 5. Low B12  PLAN:  1. Patient with history of controlled type 2 diabetes, only on low-dose metformin, without side effects.  She had significant improvement in her blood sugars before while doing the [employer].  However, at last visit, sugars were a little higher due to stress at home and dietary indiscretions.  Since they were still at goal, though, and an HbA1c was 6.3%, we did not change the regimen at that time. - at this visit, sugars are great throughout the day (she is checking frequently) but slightly higher in am (still at goal, though) - no need to change her regimen for now - refilled Metformin - I suggested to: Patient Instructions  Please continue: - Ergocalciferol 50,000 units weekly - B12 injections monthly - Livalo 2 mg daily - Metformin ER 500 mg with dinner  Please return in 6 months with your sugar log.   - today, HbA1c is 6.2% (better) - continue checking sugars at different times of the day - check 1x a day, rotating checks - advised for yearly eye exams >> she is UTD - Return to clinic in 6 mo with sugar log    2. HL - Reviewed latest lipid panel from 06/2017: LDL VERY high 2/2 dietary indiscretions and being off Livalo >> we restarted this since then Lab Results  Component Value Date   CHOL 255 (H) 07/04/2017   HDL 42.20 07/04/2017   LDLCALC 196 (H) 07/04/2017   TRIG 84.0 07/04/2017   CHOLHDL 6 07/04/2017  - Continues the statin without side effects. - check lipids tday  3. Low TSH - resolved per review of the last set of TFTs from 042019  4. Low vitamin D and 5. Low B12 - on B12 inj but not completely compliant. She was on po B12 5000 mcg daily but was forgetting doses >> preferred to get back to im inj's - will give her another inj today - on Ergocalciferol 50,000 units weekly - latest vit D was  still low in 06/2017, but improved - recheck today  Component     Latest Ref Rng & Units 12/30/2017  Cholesterol     0 - 200 mg/dL 231 (H)  Triglycerides     0.0 - 149.0 mg/dL 101.0  HDL Cholesterol     >39.00 mg/dL 47.60  VLDL     0.0 - 40.0 mg/dL 20.2  LDL (calc)     0 - 99 mg/dL 164 (H)  Total CHOL/HDL Ratio      5  NonHDL      183.77  Hemoglobin A1C     4.0 - 5.6 % 6.2 (A)  Vitamin B12     211 - 911 pg/mL 390  VITD     30.00 - 100.00 ng/mL 32.81   B12 and D vitamins normal. LDL better, but still very high >> we can try to add Zetia. Will check with pt. Philemon Kingdom, MD  PhD [facility] Endocr[facility]ology

## 2017-12-30 NOTE — Patient Instructions (Signed)
Please continue: - Ergocalciferol 50,000 units weekly - B12 injections monthly - Livalo 2 mg daily - Metformin ER 500 mg with dinner  Please return in 6 months with your sugar log.

## 2018-01-01 ENCOUNTER — Telehealth: Payer: Self-pay | Admitting: Internal Medicine

## 2018-01-02 ENCOUNTER — Encounter: Payer: Self-pay | Admitting: Internal Medicine

## 2018-01-02 ENCOUNTER — Other Ambulatory Visit: Payer: Self-pay | Admitting: Internal Medicine

## 2018-01-02 MED ORDER — EZETIMIBE 10 MG PO TABS: 10.0000 mg | ORAL_TABLET | Freq: Every day | ORAL | 3 refills | Status: DC

## 2018-01-02 MED FILL — EZETIMIBE 10 MG TABLET: 10 | 90 days supply | Qty: 90 | Fill #0

## 2018-01-07 NOTE — Telephone Encounter (Signed)
error 

## 2018-02-04 ENCOUNTER — Ambulatory Visit: Payer: No Typology Code available for payment source | Admitting: Family Medicine

## 2018-02-06 ENCOUNTER — Encounter: Payer: Self-pay | Admitting: Family Medicine

## 2018-02-07 ENCOUNTER — Ambulatory Visit (HOSPITAL_COMMUNITY)
Admission: RE | Admit: 2018-02-07 | Discharge: 2018-02-07 | Disposition: A | Discharge status: Home / Self Care | Payer: No Typology Code available for payment source | Source: Ambulatory Visit | Attending: Family Medicine | Admitting: Family Medicine

## 2018-02-07 ENCOUNTER — Ambulatory Visit: Payer: Self-pay | Admitting: Internal Medicine

## 2018-02-07 ENCOUNTER — Other Ambulatory Visit: Payer: Self-pay

## 2018-02-07 ENCOUNTER — Ambulatory Visit (INDEPENDENT_AMBULATORY_CARE_PROVIDER_SITE_OTHER): Payer: No Typology Code available for payment source | Admitting: Family Medicine

## 2018-02-07 ENCOUNTER — Ambulatory Visit: Payer: No Typology Code available for payment source | Admitting: Family Medicine

## 2018-02-07 ENCOUNTER — Encounter: Payer: Self-pay | Admitting: Family Medicine

## 2018-02-07 VITALS — BP 108/70 | HR 113 | Temp 98.8°F | Wt 197.0 lb

## 2018-02-07 DIAGNOSIS — G8929 Other chronic pain: Secondary | ICD-10-CM | POA: Diagnosis present

## 2018-02-07 DIAGNOSIS — M25562 Pain in left knee: Secondary | ICD-10-CM

## 2018-02-07 DIAGNOSIS — M25561 Pain in right knee: Secondary | ICD-10-CM

## 2018-02-07 DIAGNOSIS — E538 Deficiency of other specified B group vitamins: Secondary | ICD-10-CM

## 2018-02-07 DIAGNOSIS — E559 Vitamin D deficiency, unspecified: Secondary | ICD-10-CM | POA: Diagnosis not present

## 2018-02-07 DIAGNOSIS — E1165 Type 2 diabetes mellitus with hyperglycemia: Secondary | ICD-10-CM | POA: Diagnosis not present

## 2018-02-07 DIAGNOSIS — D332 Benign neoplasm of brain, unspecified: Secondary | ICD-10-CM

## 2018-02-07 MED ORDER — IBUPROFEN 600 MG PO TABS: 600.0000 mg | ORAL_TABLET | Freq: Three times a day (TID) | ORAL | 0 refills | Status: DC | PRN

## 2018-02-07 MED FILL — IBUPROFEN 600 MG TABLET: 600 | 30 days supply | Qty: 90 | Fill #0

## 2018-02-07 NOTE — Assessment & Plan Note (Signed)
No acute change

## 2018-02-07 NOTE — Assessment & Plan Note (Signed)
Likely arthritis. Ibuprofen 600 mg prn pain prescribed. Xray of both knees ordered. She was instructed to go to radiology today for xray. I referred her to orthopedic at her request.

## 2018-02-07 NOTE — Patient Instructions (Signed)

## 2018-02-07 NOTE — Assessment & Plan Note (Signed)
Lab checked today. 

## 2018-02-07 NOTE — Progress Notes (Signed)
Subjective:     Patient ID: [name], female   DOB: 1960/12/08, 57 y.o.   MRN: [id number]  Knee Pain   Incident onset: More than 2 months ago. The injury mechanism was a twisting injury (Twisted her knees in 2013 ). The pain is present in the right knee and left knee (Pain is more on the left). The quality of the pain is described as aching. The pain is at a severity of 5/10 (The worse could be up to 10/10 in severity). The pain is moderate. The pain has been constant since onset. Associated symptoms include an inability to bear weight. Pertinent negatives include no loss of sensation, muscle weakness, numbness or tingling. Associated symptoms comments: Weight bearing affects her. The symptoms are aggravated by movement, weight bearing and palpation (Stretching). Treatments tried: Aleeve prn. The treatment provided moderate relief.  DM2:She is compliant with her Metformin as well as with endocrinologist's f/u. No concern today. Brain Tumor: Denies any headache, no change in gait or vision. She stated that neuro informed her that no f/u is indicated. Vitamin def: On Vit D supplement and Vitamin B. Here for f/u.  Current Outpatient Medications on File Prior to Visit  Medication Sig Dispense Refill  . ACCU-CHEK FASTCLIX LANCETS MISC USE AS DIRECTED TO CHECK BLOOD SUGAR ONCE DAILY 102 each 5  . ACCU-CHEK GUIDE test strip USE AS DIRECTED TO CHECK BLOOD SUGAR ONCE DAILY 100 each 5  . cyclobenzaprine (FLEXERIL) 10 MG tablet Take 1 tablet (10 mg total) by mouth 3 (three) times daily as needed for muscle spasms. 15 tablet 0  . estradiol (ESTRACE) 1 MG tablet TAKE ONE TABLET (1 mg) BY MOUTH TWICE DAILY 60 tablet 11  . ezetimibe (ZETIA) 10 MG tablet Take 1 tablet (10 mg total) by mouth daily. 90 tablet 3  . indomethacin (INDOCIN) 25 MG capsule indomethacin 25 mg capsule  TAKE 1 CAPSULE BY MOUTH 3 TIMES DAILY  AS NEEDED WITH FOOD FOR HEMICRANIA CONTINUA    . metFORMIN (GLUCOPHAGE-XR) 500 MG 24 hr  tablet TAKE 1 TABLET BY MOUTH DAILY WITH A MEAL. 180 tablet 3  . phentermine 37.5 MG capsule Take 1 capsule (37.5 mg total) by mouth every morning. 30 capsule 0  . Pitavastatin Calcium (LIVALO) 2 MG TABS Use 1x a day 90 tablet 3  . tolterodine (DETROL LA) 4 MG 24 hr capsule Take 1 capsule (4 mg total) by mouth daily. 30 capsule 11  . Vitamin D, Ergocalciferol, (DRISDOL) 50000 units CAPS capsule Take 1 capsule (50,000 Units total) by mouth every 7 (seven) days. 12 capsule 3   No current facility-administered medications on file prior to visit.    Past Medical History:  Diagnosis Date  . Anxiety   . Arthritis   . Brain tumor (benign) ([place]) 2018  . Chest pain 05/15/2015  . Depression   . GERD (gastroesophageal reflux disease)   . H/O varicella   . History of measles, mumps, or rubella   . HSV-2 (herpes simplex virus 2) infection   . Hx of ovarian cyst   . Hyperlipidemia   . Migraine headache   . Plantar fasciitis   . Sleep apnea    pt denies  . Sleep deprivation    loss of sleep  . VAIN I (vaginal intraepithelial neoplasia grade I) 2013   pap and confirmed by colposcopic biopsy      Review of Systems  Respiratory: Negative.   Cardiovascular: Negative.   Gastrointestinal: Negative.   Musculoskeletal: Positive  for arthralgias and joint swelling.       Knee pain  Neurological: Negative.  Negative for tingling and numbness.  All other systems reviewed and are negative.      Objective:   Physical Exam  Constitutional: She appears well-developed. No distress.  Cardiovascular: Normal rate, regular rhythm, normal heart sounds and intact distal pulses.  No murmur heard. Pulmonary/Chest: Effort normal and breath sounds normal. No stridor. No respiratory distress. She has no wheezes.  Abdominal: Soft. Bowel sounds are normal. She exhibits no distension and no mass. There is no tenderness.  Musculoskeletal: Normal range of motion. She exhibits no edema.       Right knee: Normal.         Left knee: She exhibits swelling. She exhibits normal range of motion, no effusion, no deformity and no erythema.       Legs: Nursing note and vitals reviewed.      Assessment:     Knee pain B/L DM Benign brain tumor Vitamin D deficiency Vit B12 deficiency     Plan:     Check problem list.

## 2018-02-07 NOTE — Assessment & Plan Note (Signed)
Recent A1C checked at her Endo visit looks fine. Continue f/u as planned.

## 2018-02-08 LAB — VITAMIN D 25 HYDROXY (VIT D DEFICIENCY, FRACTURES): Vit D, 25-Hydroxy: 16.8 ng/mL — ABNORMAL LOW (ref 30.0–100.0)

## 2018-02-08 LAB — VITAMIN B12: Vitamin B-12: 613 pg/mL (ref 232–1245)

## 2018-02-10 ENCOUNTER — Telehealth: Payer: Self-pay | Admitting: Family Medicine

## 2018-02-10 MED FILL — VIT D2 1.25 MG (50,000 UNIT: 1.25 MG | 84 days supply | Qty: 12 | Fill #1

## 2018-02-10 NOTE — Telephone Encounter (Signed)
Xray and Vit D result discussed with her. F/U with ortho as planned. Continue Vitamin D supplement. She has refill at the pharmacy. No question from her and she understood result discussed with her.

## 2018-03-13 MED FILL — OFLOXACIN 0.3% EYE DROPS: 0.3 | 15 days supply | Qty: 5 | Fill #0

## 2018-04-01 IMAGING — MR MR HEAD WO/W CM
11 of 13 series · 34 of 48 positions shown · IV contrast (Yes   MULTIHANCE)
Comparison: 02/09/2014

CLINICAL DATA: Possible subependymoma seen on previous MRI. Initial
workup.

EXAM:
MRI HEAD WITHOUT AND WITH CONTRAST
TECHNIQUE: Multiplanar, multiecho pulse sequences of the brain and surrounding
structures were obtained without and with intravenous contrast.
CONTRAST:  20 cc MultiHance intravenous

[Series 6: T1 · sagittal · 5.0mm · 0.47mm/px · 2 of 23 slices shown]
[im 1/23]
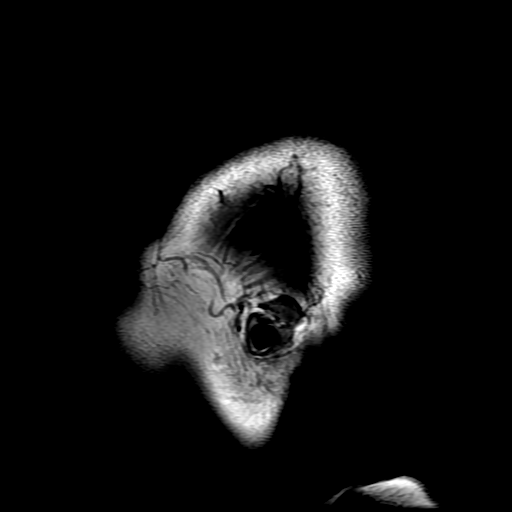
[im 23/23]
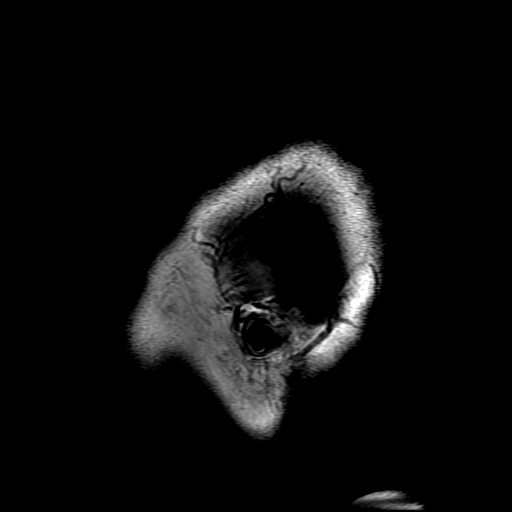

[Series 7: DWI · axial · 3.0mm · 1.09mm/px · z∈[-108,+30]mm · 8 of 94 slices shown (1 of 4)]
[im 1/94]
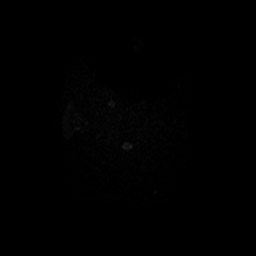
[im 14/94]
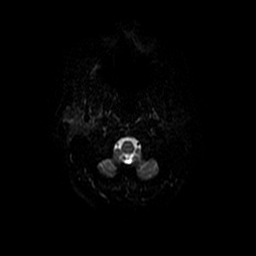
[im 27/94]
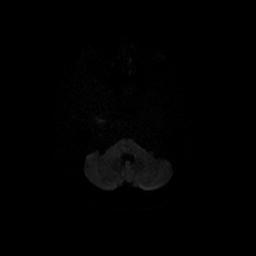
[im 40/94]
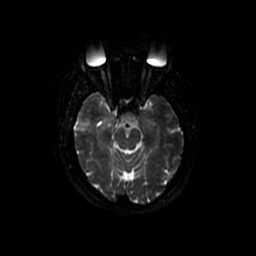
[im 54/94]
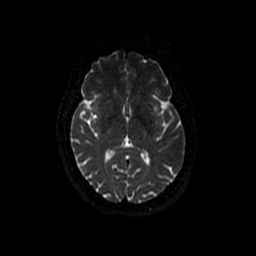
[im 67/94]
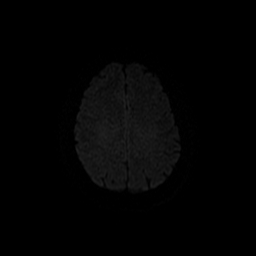
[im 80/94]
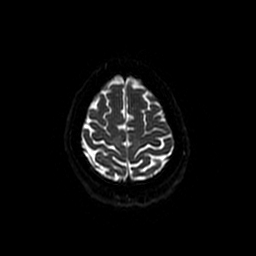
[im 94/94]
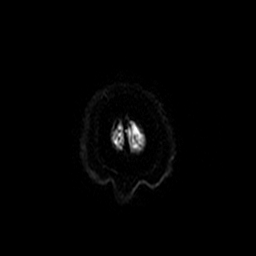

[Series 8: T2 · axial · 5.0mm · 0.43mm/px · z∈[-116,+27]mm · 2 of 25 slices shown]
[im 1/25]
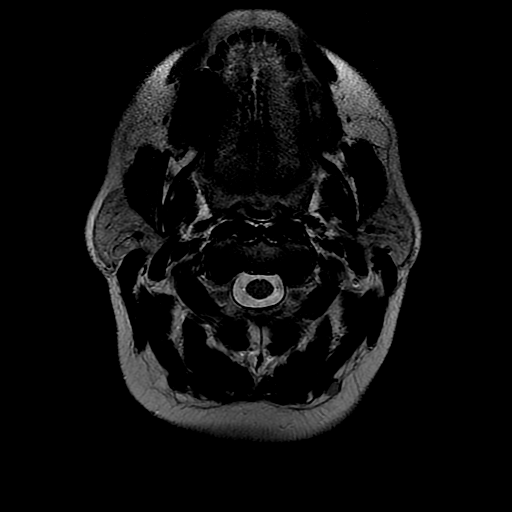
[im 25/25]
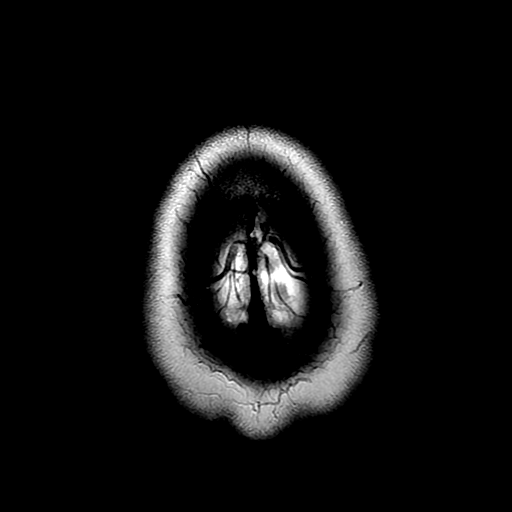

[Series 9: DWI · coronal · 5.0mm · 1.09mm/px · 6 of 76 slices shown (2 of 4)]
[im 1/76]
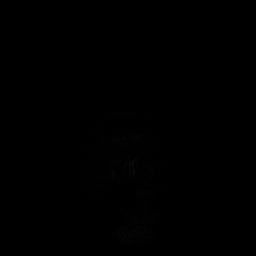
[im 16/76]
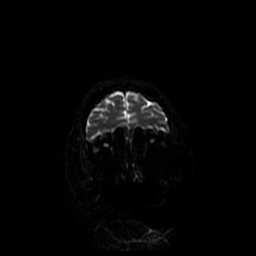
[im 31/76]
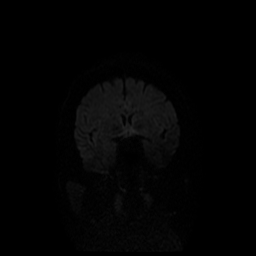
[im 46/76]
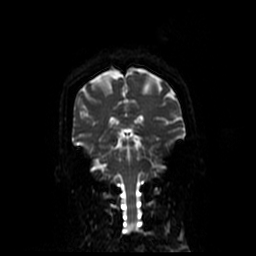
[im 61/76]
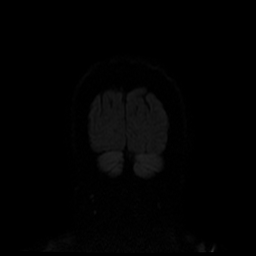
[im 76/76]
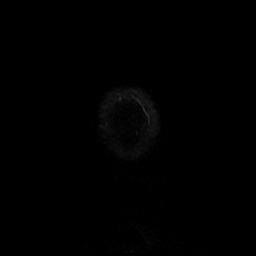

[Series 10: FLAIR · axial · 5.0mm · 0.43mm/px · z∈[-116,+27]mm · 2 of 25 slices shown]
[im 1/25]
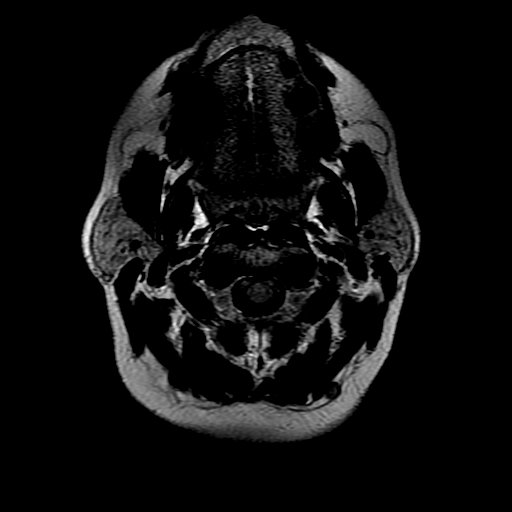
[im 25/25]
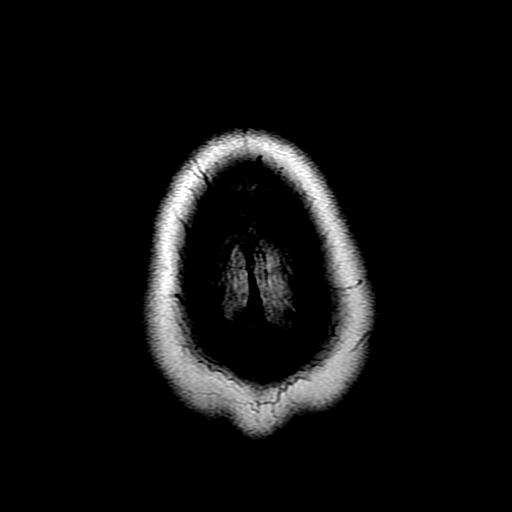

[Series 11: ax mpgr · axial · 5.0mm · 0.43mm/px · 1 of 22 slices shown]
[im 1/22]
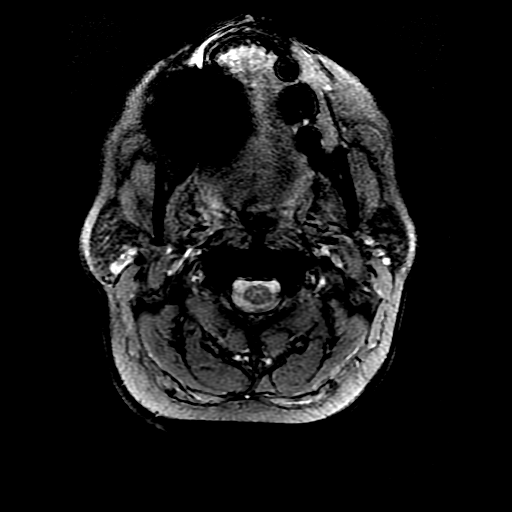

[Series 13: T2 post-contrast · coronal · 5.0mm · 0.39mm/px · 2 of 25 slices shown]
[im 1/25]
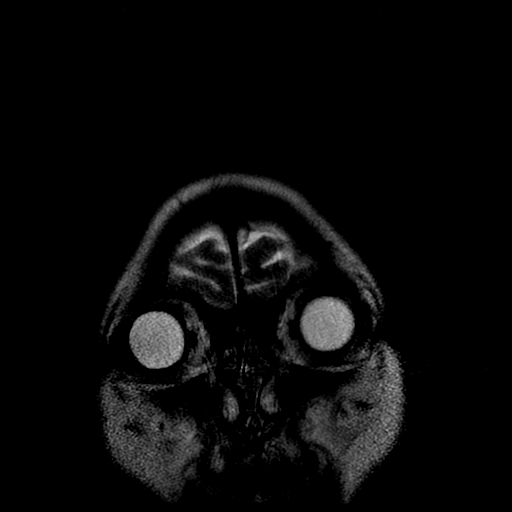
[im 25/25]
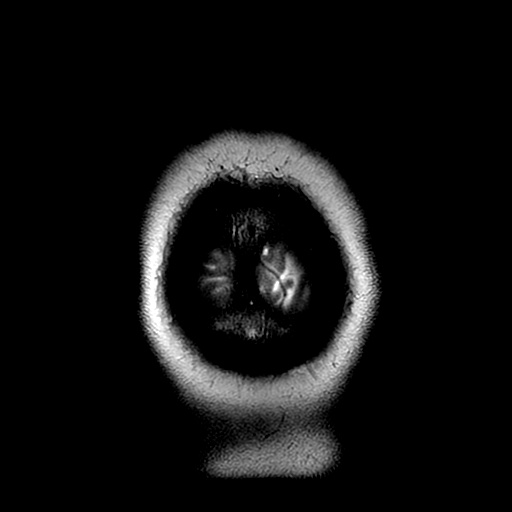

[Series 15: T1 post-contrast · coronal · 5.0mm · 0.39mm/px · 2 of 25 slices shown (1 of 2)]
[im 1/25]
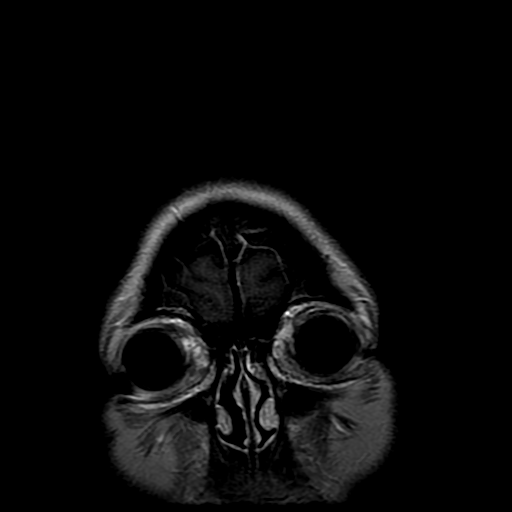
[im 25/25]
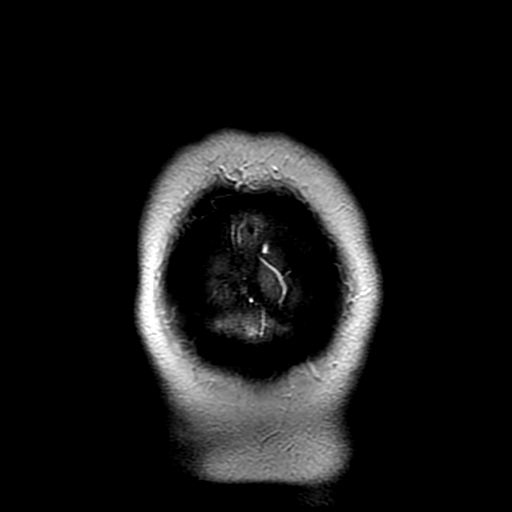

[Series 16: T1 post-contrast · sagittal · 5.0mm · 0.47mm/px · 2 of 23 slices shown (2 of 2)]
[im 1/23]
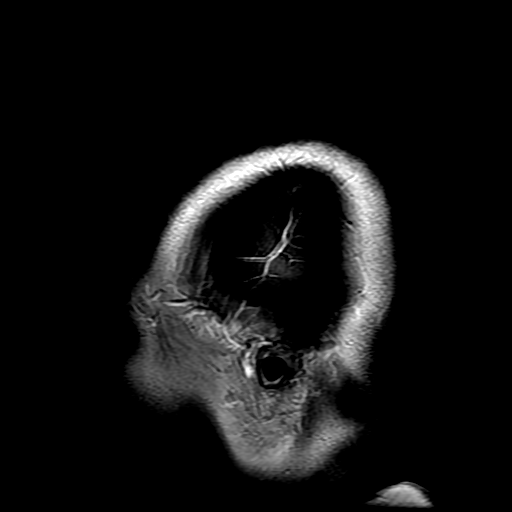
[im 23/23]
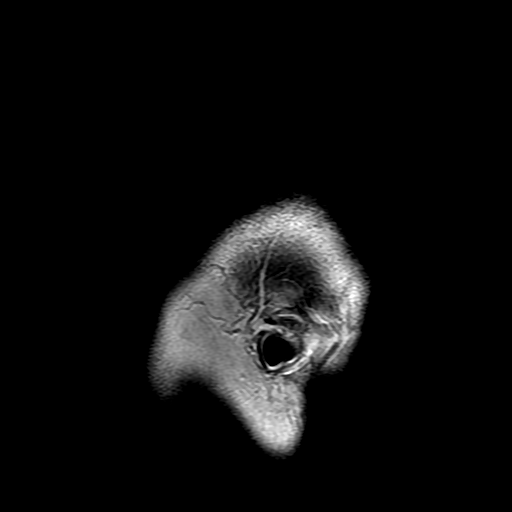

[Series 700: DWI · axial · 3.0mm · 1.09mm/px · z∈[-108,+30]mm · 4 of 47 slices shown (3 of 4)]
[im 1/47]
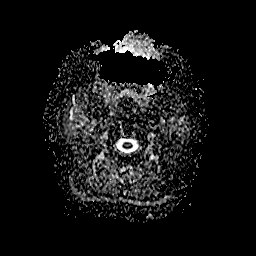
[im 16/47]
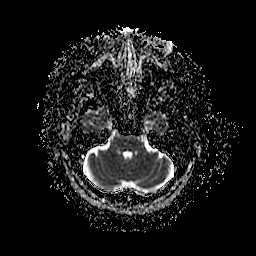
[im 31/47]
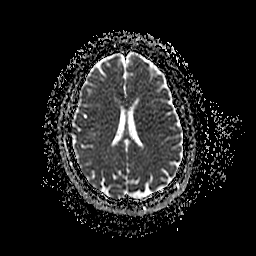
[im 47/47]
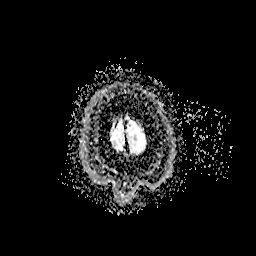

[Series 900: DWI · coronal · 5.0mm · 1.09mm/px · 3 of 38 slices shown (4 of 4)]
[im 1/38]
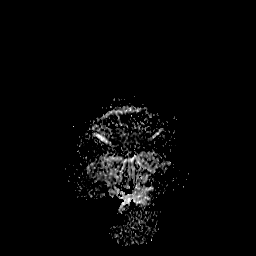
[im 19/38]
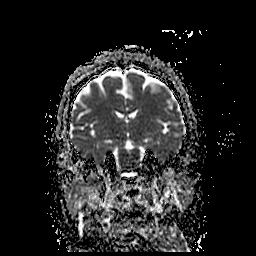
[im 38/38]
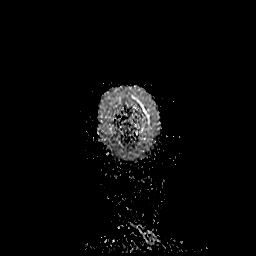

[34 of 48 positions shown; findings below may reference images not displayed]

FINDINGS: Brain: 9 x 5 mm somewhat flat enhancing nodule at the obex that is
mildly T2 hyperintense and T1 isointense. Although somewhat
elongated choroid plexus is usually not seen at this level and this
is most consistent with a small sub ependymoma. The appearance is
stable from 2053 when allowing for differences in slice selection

Few FLAIR hyperintensities in the cerebral white matter with mild
increase versus better visualization. These could be related
patient's history of diabetes and migraine. No specific
demyelinating pattern. No atrophy. No acute infarct, hemorrhage, or
hydrocephalus.

Vascular: Major vessels are patent

Skull and upper cervical spine: Negative for marrow lesion.

Sinuses/Orbits: Negative
IMPRESSION: Subcentimeter nodular enhancement posterior to the obex is most
consistent with a benign subependymoma. In retrospect, this nodule
is stable from 2053.

## 2018-04-17 MED FILL — ACCU-CHEK GUIDE STRP: 90 days supply | Qty: 100 | Fill #1

## 2018-04-17 MED FILL — metFORMIN HCL ER 500 MG TB2: 500 | 90 days supply | Qty: 90 | Fill #1

## 2018-04-17 MED FILL — ACCU-CHEK FASTCLIX LANCETS: 90 days supply | Qty: 102 | Fill #1

## 2018-04-18 ENCOUNTER — Other Ambulatory Visit: Payer: Self-pay

## 2018-04-18 MED ORDER — ESTRADIOL 1 MG PO TABS: ORAL_TABLET | ORAL | 8 refills | Status: DC

## 2018-04-18 NOTE — Telephone Encounter (Signed)
Pharmacy states that patient is taking this medication once daily instead of twice daily. Pharmacy requesting RX that reflects this. I have changed free text to reflect this. Please advise.

## 2018-04-21 MED FILL — ESTRADIOL 1 MG TABLET: 1 | 30 days supply | Qty: 30 | Fill #0

## 2018-05-12 ENCOUNTER — Encounter: Payer: Self-pay | Admitting: Obstetrics and Gynecology

## 2018-05-12 ENCOUNTER — Ambulatory Visit (INDEPENDENT_AMBULATORY_CARE_PROVIDER_SITE_OTHER): Payer: No Typology Code available for payment source | Admitting: Obstetrics and Gynecology

## 2018-05-12 VITALS — BP 136/80 | HR 100 | Ht 64.0 in | Wt 201.0 lb

## 2018-05-12 DIAGNOSIS — R350 Frequency of micturition: Secondary | ICD-10-CM

## 2018-05-12 LAB — POCT URINALYSIS DIPSTICK
Bilirubin, UA: NEGATIVE
Blood, UA: NEGATIVE
Glucose, UA: NEGATIVE
Ketones, UA: NEGATIVE
Leukocytes, UA: NEGATIVE
Nitrite, UA: NEGATIVE
Protein, UA: NEGATIVE
Urobilinogen, UA: 0.2 E.U./dL
pH, UA: 5 (ref 5.0–8.0)

## 2018-05-12 NOTE — Patient Instructions (Signed)

## 2018-05-12 NOTE — Progress Notes (Signed)
GYNECOLOGY  VISIT   HPI: 58 y.o.   Married  [place] American  female   [phone] with No LMP recorded (lmp unknown). Patient has had a hysterectomy.   here for frequent urination and urgency.    Voiding more often than every hour.  Yesterday and today her urgency has increased.   Taking Detrol at hs.   Drinks coffee, one cup in am, not every day.  May put lemon in her water.   Her blood sugar is between 96 - 110.  Not sexually active.  Does not feel dryness.   Really wants to take Phentermine again for weight loss.  Feels good when she is on it.   Urine Dip: Neg  GYNECOLOGIC HISTORY: No LMP recorded (lmp unknown). Patient has had a hysterectomy. Contraception:  Hysterectomy Menopausal hormone therapy: Estrace 1mg  Last mammogram: 12-13-17 3D Neg/density C/BiRads1 Last pap smear: 11-12-16 Neg, 10-14-13 Neg:Neg HR HPV        OB History    Gravida  3   Para  2   Term  2   Preterm      AB  1   Living  2     SAB      TAB      Ectopic      Multiple      Live Births  2              Patient Active Problem List   Diagnosis Date Noted  . Bilateral knee pain 02/07/2018  . Pain in joint, ankle and foot 09/10/2017  . Cervical disc disorder with radiculopathy of cervical region 12/11/2016  . Low vitamin B12 level 12/04/2016  . Vitamin D deficiency 12/04/2016  . Low TSH level 12/04/2016  . Right shoulder pain 08/29/2016  . Brain tumor (benign) ([place]) 03/19/2016  . Sleep apnea 05/27/2015  . Solitary pulmonary nodule 05/27/2015  . Hot flashes 05/27/2015  . GERD (gastroesophageal reflux disease) 05/13/2015  . Diabetes mellitus, type 2 ([place]) 04/12/2015  . Urine, incontinence, stress female 06/18/2014  . Depression with anxiety 01/22/2014  . VAIN I (vaginal intraepithelial neoplasia grade I)   . Hyperlipidemia 10/22/2013  . Migraine headache 10/04/2008    Past Medical History:  Diagnosis Date  . Anxiety   . Arthritis   . Brain tumor (benign) ([place]) 2018  .  Chest pain 05/15/2015  . Depression   . GERD (gastroesophageal reflux disease)   . H/O varicella   . History of measles, mumps, or rubella   . HSV-2 (herpes simplex virus 2) infection   . Hx of ovarian cyst   . Hyperlipidemia   . Hypersomnia 07/19/2015  . Migraine headache   . Plantar fasciitis   . Scapular dyskinesis 10/03/2016  . Sleep apnea    pt denies  . Sleep deprivation    loss of sleep  . VAIN I (vaginal intraepithelial neoplasia grade I) 2013   pap and confirmed by colposcopic biopsy    Past Surgical History:  Procedure Laterality Date  . ABDOMINAL HYSTERECTOMY    . CHOLECYSTECTOMY  11/13/10  . COLONOSCOPY  Multiple  . DILATION AND CURETTAGE OF UTERUS    . ESOPHAGOGASTRODUODENOSCOPY    . OOPHORECTOMY  2009   bilateral salpingo-oophorectomy.  . TUBAL LIGATION      Current Outpatient Medications  Medication Sig Dispense Refill  . ACCU-CHEK FASTCLIX LANCETS MISC USE AS DIRECTED TO CHECK BLOOD SUGAR ONCE DAILY 102 each 5  . ACCU-CHEK GUIDE test strip USE AS DIRECTED TO CHECK  BLOOD SUGAR ONCE DAILY 100 each 5  . estradiol (ESTRACE) 1 MG tablet TAKE ONE TABLET (1 mg) BY MOUTH ONCE DAILY. 30 tablet 8  . ezetimibe (ZETIA) 10 MG tablet Take 1 tablet (10 mg total) by mouth daily. 90 tablet 3  . ibuprofen (ADVIL,MOTRIN) 600 MG tablet Take 1 tablet (600 mg total) by mouth every 8 (eight) hours as needed. 90 tablet 0  . metFORMIN (GLUCOPHAGE-XR) 500 MG 24 hr tablet TAKE 1 TABLET BY MOUTH DAILY WITH A MEAL. 180 tablet 3  . phentermine 37.5 MG capsule Take 1 capsule (37.5 mg total) by mouth every morning. 30 capsule 0  . Pitavastatin Calcium (LIVALO) 2 MG TABS Use 1x a day 90 tablet 3  . tolterodine (DETROL LA) 4 MG 24 hr capsule Take 1 capsule (4 mg total) by mouth daily. 30 capsule 11  . Vitamin D, Ergocalciferol, (DRISDOL) 50000 units CAPS capsule Take 1 capsule (50,000 Units total) by mouth every 7 (seven) days. 12 capsule 3   No current facility-administered medications for  this visit.      ALLERGIES: Sulfa antibiotics  Family History  Problem Relation Age of Onset  . Hypertension Mother   . Hypotension Mother   . Anemia Mother        low iron  . Heart disease Sister   . Sickle cell trait Other   . Alcohol abuse Brother   . Alcohol abuse Brother   . Drug abuse Brother   . Colon cancer Neg Hx   . Esophageal cancer Neg Hx   . Stomach cancer Neg Hx   . Breast cancer Neg Hx     Social History   Socioeconomic History  . Marital status: Married    Spouse name: Not on file  . Number of children: Not on file  . Years of education: Not on file  . Highest education level: Not on file  Occupational History  . Not on file  Social Needs  . Financial resource strain: Not on file  . Food insecurity:    Worry: Not on file    Inability: Not on file  . Transportation needs:    Medical: Not on file    Non-medical: Not on file  Tobacco Use  . Smoking status: Former Smoker    Packs/day: 0.50    Types: Cigarettes  . Smokeless tobacco: Never Used  Substance and Sexual Activity  . Alcohol use: No    Alcohol/week: 0.0 standard drinks  . Drug use: No  . Sexual activity: Yes    Partners: Male    Birth control/protection: Surgical    Comment: Hysterectomy  Lifestyle  . Physical activity:    Days per week: Not on file    Minutes per session: Not on file  . Stress: Not on file  Relationships  . Social connections:    Talks on phone: Not on file    Gets together: Not on file    Attends religious service: Not on file    Active member of club or organization: Not on file    Attends meetings of clubs or organizations: Not on file    Relationship status: Not on file  . Intimate partner violence:    Fear of current or ex partner: Not on file    Emotionally abused: Not on file    Physically abused: Not on file    Forced sexual activity: Not on file  Other Topics Concern  . Not on file  Social History Narrative  Patient lives at home with her  husband [name]). 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 Two children. Both boys    Review of Systems  Genitourinary: Positive for frequency and urgency.  All other systems reviewed and are negative.   PHYSICAL EXAMINATION:    BP 136/80 (BP Location: Right Arm, Patient Position: Sitting, Cuff Size: Large)   Pulse 100   Ht 5\' 4"  (1.626 m)   Wt 201 lb (91.2 kg)   LMP  (LMP Unknown)   BMI 34.50 kg/m     General appearance: alert, cooperative and appears stated age    Pelvic: External genitalia:  no lesions              Urethra:  normal appearing urethra with no masses, tenderness or lesions              Bartholins and Skenes: normal                 Vagina: normal appearing vagina with normal color and discharge, no lesions              Cervix:  Absent.                Bimanual Exam:  Uterus:  absent              Adnexa: no mass, fullness, tenderness              Rectal exam: Yes.  .  Confirms.              Anus:  normal sphincter tone, no lesions  Chaperone was present for exam.  ASSESSMENT  Status post TAH.  Status post BSO.  Urgency and frequency.   Has tried Ditropan XL, Myrbetriq, and Detrol LA.  DM.  ERT.   PLAN  We reviewed bladder irritants.  Check A1C.  Continue Detrol LA.  We reviewed other options for medication therapy.  She will consider referral to [facility] or Dr. Blossom Hoops.    An After Visit Summary was printed and given to the patient.  __15____ minutes face to face time of which over 50% was spent in counseling.

## 2018-05-13 LAB — HEMOGLOBIN A1C
Est. average glucose Bld gHb Est-mCnc: 131 mg/dL
Hgb A1c MFr Bld: 6.2 % — ABNORMAL HIGH (ref 4.8–5.6)

## 2018-05-13 MED FILL — OXYBUTYNIN CL ER 10 MG TAB: 10 | 30 days supply | Qty: 30 | Fill #1

## 2018-05-16 ENCOUNTER — Ambulatory Visit (INDEPENDENT_AMBULATORY_CARE_PROVIDER_SITE_OTHER): Payer: No Typology Code available for payment source | Admitting: Family Medicine

## 2018-05-16 ENCOUNTER — Encounter: Payer: Self-pay | Admitting: Family Medicine

## 2018-05-16 VITALS — BP 122/80 | HR 92 | Temp 98.2°F | Ht 64.0 in | Wt 201.0 lb

## 2018-05-16 DIAGNOSIS — G5602 Carpal tunnel syndrome, left upper limb: Secondary | ICD-10-CM | POA: Diagnosis not present

## 2018-05-16 MED ORDER — WRIST BRACE/LEFT MEDIUM MISC: 1.0000 | 0 refills | Status: DC | PRN

## 2018-05-16 MED ORDER — MELOXICAM 15 MG PO TABS: 15.0000 mg | ORAL_TABLET | Freq: Every day | ORAL | 0 refills | Status: DC

## 2018-05-16 MED FILL — MELOXICAM 15 MG TABLET: 15 | 30 days supply | Qty: 30 | Fill #0

## 2018-05-16 NOTE — Progress Notes (Signed)
   HPI 58 year old African-American female who presents for 2-week history of left arm pain.  She describes the pain as a "pins-and-needles sensation" that affects the lateral aspect of her left arm, and thumb, ring, middle fingers.  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  The pain can occasionally be a little worse at night but it is sort of random when it happens.  She is not trying to relieve the pain.  CC: Left arm pain   ROS:   Review of Systems See HPI for ROS.   CC, SH/smoking status, and VS noted  Objective: BP 122/80   Pulse 92   Temp 98.2 F (36.8 C) (Oral)   Ht 5\' 4"  (1.626 m)   Wt 201 lb (91.2 kg)   LMP  (LMP Unknown)   SpO2 100%   BMI 34.50 kg/m  Gen: 58 year old African female, no acute distress, resting comfortably CV: RRR, no murmur Resp: CTAB, no wheezes, non-labored Neuro: Alert and oriented, Speech clear, No gross deficits Left arm: No asymmetry between left and right arm.  Palpable radial pulse left arm.  Positive Phalen sign, negative Tinel's.  Full range of motion intact.   Assessment and plan:  Carpal tunnel syndrome of left wrist History and physical exam consistent with carpal tunnel syndrome.  Reviewed relevant anatomy with patient.  Will try meloxicam 15 mg daily, medium wrist brace to be worn at work and at night.  Gave rehab exercises to do.  Patient to follow-up in 4 weeks if pain not improved.  Follow-up sooner if symptoms worsening.  Reviewed neck steps, including steroid injection into carpal tunnel and further down the line carpal tunnel release.   No orders of the defined types were placed in this encounter.   Meds ordered this encounter  Medications  . Elastic Bandages & Supports (WRIST BRACE/LEFT MEDIUM) MISC    Sig: 1 each by Does not apply route as needed.    Dispense:  1 each    Refill:  0  . meloxicam (MOBIC) 15 MG tablet    Sig: Take 1 tablet (15 mg total) by mouth daily.    Dispense:  30  tablet    Refill:  0     Guadalupe Dawn MD PGY-2 Family Medicine Resident  05/16/2018 11:12 AM

## 2018-05-16 NOTE — Assessment & Plan Note (Signed)
History and physical exam consistent with carpal tunnel syndrome.  Reviewed relevant anatomy with patient.  Will try meloxicam 15 mg daily, medium wrist brace to be worn at work and at night.  Gave rehab exercises to do.  Patient to follow-up in 4 weeks if pain not improved.  Follow-up sooner if symptoms worsening.  Reviewed neck steps, including steroid injection into carpal tunnel and further down the line carpal tunnel release.

## 2018-05-16 NOTE — Patient Instructions (Signed)
It was great meeting you today!  I think your left arm pain is due to carpal tunnel syndrome.  We reviewed the relevant anatomy to describe where your pain is at.  Treatment for this occurs in 2 areas.  1 we will give you anti-inflammatory medication.  This will be in the form of meloxicam 15 mg daily.  The other is with bracing your wrist at night especially.  You may find that you also need to wear this at work.  Icing and topical liniments may also help control the discomfort.  I gave you a list of rehab stretches to do.  Please make sure days every day.  Please give it 3 to 4 weeks, doing all his activities and if your pain is still present please let us know.

## 2018-05-30 ENCOUNTER — Ambulatory Visit: Payer: No Typology Code available for payment source | Admitting: Family Medicine

## 2018-05-30 LAB — HM DIABETES EYE EXAM

## 2018-07-04 ENCOUNTER — Encounter: Payer: Self-pay | Admitting: Internal Medicine

## 2018-07-04 ENCOUNTER — Ambulatory Visit: Payer: No Typology Code available for payment source | Admitting: Internal Medicine

## 2018-07-04 ENCOUNTER — Ambulatory Visit (INDEPENDENT_AMBULATORY_CARE_PROVIDER_SITE_OTHER): Payer: No Typology Code available for payment source | Admitting: Internal Medicine

## 2018-07-04 DIAGNOSIS — E1165 Type 2 diabetes mellitus with hyperglycemia: Secondary | ICD-10-CM | POA: Diagnosis not present

## 2018-07-04 DIAGNOSIS — E538 Deficiency of other specified B group vitamins: Secondary | ICD-10-CM

## 2018-07-04 DIAGNOSIS — E785 Hyperlipidemia, unspecified: Secondary | ICD-10-CM | POA: Diagnosis not present

## 2018-07-04 DIAGNOSIS — E559 Vitamin D deficiency, unspecified: Secondary | ICD-10-CM

## 2018-07-04 MED ORDER — PITAVASTATIN CALCIUM 2 MG PO TABS: ORAL_TABLET | ORAL | 3 refills | Status: DC

## 2018-07-04 MED ORDER — VITAMIN D (ERGOCALCIFEROL) 1.25 MG (50000 UNIT) PO CAPS: 50000.0000 [IU] | ORAL_CAPSULE | ORAL | 3 refills | Status: DC

## 2018-07-04 MED FILL — LIVALO 2 MG TABLET: 2 | 90 days supply | Qty: 90 | Fill #0

## 2018-07-04 MED FILL — VIT D2 1.25 MG (50,000 UNIT: 1.25 MG | 84 days supply | Qty: 12 | Fill #0

## 2018-07-04 NOTE — Progress Notes (Signed)
Patient ID: [name], female   DOB: 11-Dec-1960, 58 y.o.   MRN: [id number]  Patient location: Home My location: Office  Referring Provider: [name], MD  I connected with the patient on 07/04/18 at  8:13 AM EDT by a video enabled telemedicine application and verified that I am speaking with the correct person.   I discussed the limitations of evaluation and management by telemedicine and the availability of in person appointments. The patient expressed understanding and agreed to proceed.   Details of the encounter are shown below.  HPI: [name] is a 58 y.o.-year-old female, presenting for f/u for DM2; prediabetes dx early 2000s, controlled, without long term complications, hyperlipidemia, subclinical thyrotoxicosis. Also, vitamin D deficiency and low vitamin B12.Last visit 6 mo ago.  She saw ObGyn in 04/2018 for urinary frequency >> no UTI. Started Detrol >> urinary frequency resolved.  Last hemoglobin A1c was: Lab Results  Component Value Date   HGBA1C 6.2 (H) 05/12/2018   HGBA1C 6.2 (A) 12/30/2017   HGBA1C 6.3 (A) 08/15/2017  She had several steroid inj in the past- last in 04/2014.  She is on: - Metformin ER 500 mg 2x a day >> we had to decrease to once a day 2/2 GI discomfort >> 500 mg with dinner She was previously on: - Tradjenta 5 mg in am, before b'fast  - Invokana 100 mg in am - started 03/2015 >> she had CP, HA, lightheadedness - regular metformin >> diarrhea/nausea with regular metformin.  Pt checks sugars  2x a day: - am:77, 85-95 >> 98-103, 118 >> 88-131 >> 88-95 - 2h after b'fast:  n/c >> 90-125 >> 86-127 >> n/c - lunch: 95-100 >> 90s >> n/c >> 87-120 >> n/c - 2h after lunch:  95-100 >> n/c >> 86-118 >> n/c - before dinner:80-100 >> n/c >> 80-127 >> n/c - 2h after dinner:up to 135 >> 90-120 >> 98-103 - bedtime:  85-100 >> n/c >> 97-121 >> n/c - nighttime: 89-116 Lowest sugar was  66 >> 80 >> 88; it is unclear at which level she has  hypoglycemia awareness. Highest: 135 >> 131 >> 106.  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  -No CKD, last BUN/creatinine:  Lab Results  Component Value Date   BUN 14 07/04/2017   CREATININE 0.92 07/04/2017   - last eye exam was in 05/2018: No DR. [facility]. Saw Dr Claudean Kinds before last visit: She had a corneal ulcer. - no numbness and tingling in her feet  HL:  She has a history of very high LDL: Lab Results  Component Value Date   CHOL 231 (H) 12/30/2017   CHOL 255 (H) 07/04/2017   CHOL 167 04/19/2016   Lab Results  Component Value Date   HDL 47.60 12/30/2017   HDL 42.20 07/04/2017   HDL 44.50 04/19/2016   Lab Results  Component Value Date   LDLCALC 164 (H) 12/30/2017   LDLCALC 196 (H) 07/04/2017   LDLCALC 102 (H) 04/19/2016   Lab Results  Component Value Date   TRIG 101.0 12/30/2017   TRIG 84.0 07/04/2017   TRIG 101.0 04/19/2016   Lab Results  Component Value Date   CHOLHDL 5 12/30/2017   CHOLHDL 6 07/04/2017   CHOLHDL  4 04/19/2016   At last visit, she continued on Livalo and we added Zetia  She had normal LFTs: Lab Results  Component Value Date   ALT 10 07/04/2017   AST 12 07/04/2017   ALKPHOS 79 05/15/2016   BILITOT 0.3 07/04/2017   In spring 2018 she was complaining of fatigue and we found that her vitamin B12 and D were low: Lab Results  Component Value Date   VD25OH 16.8 (L) 02/07/2018   VD25OH 32.81 12/30/2017   VD25OH 23.26 (L) 07/04/2017   VD25OH 17.98 (L) 08/03/2016   VD25OH 9 (L) 05/13/2015   Lab Results  Component Value Date   VITAMINB12 613 02/07/2018   VITAMINB12 390 12/30/2017   VITAMINB12 368 07/04/2017   VITAMINB12 315 08/03/2016   VITAMINB12 428 05/13/2015   VITAMINB12 496  01/22/2014   She continues on: -B12 injections monthly -not very compliant with these -Ergocalciferol 50,000 units weekly - not compliant with these  Subclinical thyrotoxicosis  -She has a history of slightly low TSH levels x2 with normal free thyroid hormones, but the TSH normalized at last 2 checks Lab Results  Component Value Date   TSH 0.42 07/04/2017   TSH 0.97 12/04/2016   TSH 0.34 (L) 08/03/2016   TSH 0.430 (L) 06/08/2016   TSH 0.77 05/13/2015   TSH 0.619 01/22/2014   TSH 0.730 10/14/2013   TSH 0.492 09/01/2012   TSH 0.745 10/05/2011   Lab Results  Component Value Date   FREET4 0.70 07/04/2017   FREET4 0.77 12/04/2016   FREET4 0.79 08/03/2016   T3FREE 3.8 07/04/2017   T3FREE 3.4 12/04/2016   T3FREE 3.6 08/03/2016   She has a history of dysphagia and had esophageal stretching.  ROS: Constitutional: no weight gain/no weight loss, no fatigue, no subjective hyperthermia, no subjective hypothermia Eyes: no blurry vision, no xerophthalmia ENT: no sore throat, no nodules palpated in neck, + dysphagia, no odynophagia, no hoarseness Cardiovascular: no CP/no SOB/no palpitations/no leg swelling Respiratory: no cough/no SOB/no wheezing Gastrointestinal: no N/no V/no D/no C/no acid reflux Musculoskeletal: no muscle aches/no joint aches Skin: no rashes, no hair loss Neurological: no tremors/no numbness/no tingling/no dizziness  I reviewed pt's medications, allergies, PMH, social hx, family hx, and changes were documented in the history of present illness. Otherwise, unchanged from my initial visit note.  Past Medical History:  Diagnosis Date  . Anxiety   . Arthritis   . Brain tumor (benign) ([place]) 2018  . Chest pain 05/15/2015  . Depression   . GERD (gastroesophageal reflux disease)   . H/O varicella   . History of measles, mumps, or rubella   . HSV-2 (herpes simplex virus 2) infection   . Hx of ovarian cyst   . Hyperlipidemia   . Hypersomnia 07/19/2015  . Migraine  headache   . Plantar fasciitis   . Scapular dyskinesis 10/03/2016  . Sleep apnea    pt denies  . Sleep deprivation    loss of sleep  . 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 ABDOMINAL HYSTERECTOMY    . CHOLECYSTECTOMY  11/13/10  . COLONOSCOPY  Multiple  . DILATION AND CURETTAGE OF UTERUS    . ESOPHAGOGASTRODUODENOSCOPY    . OOPHORECTOMY  2009   bilateral salpingo-oophorectomy.  . TUBAL LIGATION     Social History   Socioeconomic History  . Marital status: Married    Spouse name: Not on file  .  Number of children: Not on file  . Years of education: Not on file  . Highest education level: Not on file  Occupational History  . Not on file  Social Needs  . Financial resource strain: Not on file  . Food insecurity:    Worry: Not on file    Inability: Not on file  . Transportation needs:    Medical: Not on file    Non-medical: Not on file  Tobacco Use  . Smoking status: Former Smoker    Packs/day: 0.50    Types: Cigarettes  . Smokeless tobacco: Never Used  Substance and Sexual Activity  . Alcohol use: No    Alcohol/week: 0.0 standard drinks  . Drug use: No  . Sexual activity: Yes    Partners: Male    Birth control/protection: Surgical    Comment: Hysterectomy  Lifestyle  . Physical activity:    Days per week: Not on file    Minutes per session: Not on file  . Stress: Not on file  Relationships  . Social connections:    Talks on phone: Not on file    Gets together: Not on file    Attends religious service: Not on file    Active member of club or organization: Not on file    Attends meetings of clubs or organizations: Not on file    Relationship status: Not on file  . Intimate partner violence:    Fear of current or ex partner: Not on file    Emotionally abused: Not on file    Physically abused: Not on file    Forced sexual activity: Not on file   Other Topics Concern  . Not on file  Social History Narrative   Patient lives at home with her husband [name]). 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 Two children. Both boys   Current Outpatient Medications on File Prior to Visit  Medication Sig Dispense Refill  . ACCU-CHEK FASTCLIX LANCETS MISC USE AS DIRECTED TO CHECK BLOOD SUGAR ONCE DAILY 102 each 5  . ACCU-CHEK GUIDE test strip USE AS DIRECTED TO CHECK BLOOD SUGAR ONCE DAILY 100 each 5  . Elastic Bandages & Supports (WRIST BRACE/LEFT MEDIUM) MISC 1 each by Does not apply route as needed. 1 each 0  . estradiol (ESTRACE) 1 MG tablet TAKE ONE TABLET (1 mg) BY MOUTH ONCE DAILY. 30 tablet 8  . ezetimibe (ZETIA) 10 MG tablet Take 1 tablet (10 mg total) by mouth daily. 90 tablet 3  . ibuprofen (ADVIL,MOTRIN) 600 MG tablet Take 1 tablet (600 mg total) by mouth every 8 (eight) hours as needed. 90 tablet 0  . meloxicam (MOBIC) 15 MG tablet Take 1 tablet (15 mg total) by mouth daily. 30 tablet 0  . metFORMIN (GLUCOPHAGE-XR) 500 MG 24 hr tablet TAKE 1 TABLET BY MOUTH DAILY WITH A MEAL. 180 tablet 3  . phentermine 37.5 MG capsule Take 1 capsule (37.5 mg total) by mouth every morning. 30 capsule 0  . Pitavastatin Calcium (LIVALO) 2 MG TABS Use 1x a day 90 tablet 3  . tolterodine (DETROL LA) 4 MG 24 hr capsule Take 1 capsule (4 mg total) by mouth daily. 30 capsule 11  . Vitamin D, Ergocalciferol, (DRISDOL) 50000 units CAPS capsule Take 1 capsule (50,000 Units total) by mouth every 7 (seven) days. 12 capsule 3   No current facility-administered medications on file prior to visit.  Allergies  Allergen Reactions  . Sulfa Antibiotics Anaphylaxis, Hives and Swelling   Family History  Problem Relation Age of Onset  . Hypertension Mother   . Hypotension Mother   . Anemia Mother        low iron  . Heart disease Sister   . Sickle cell trait Other   . Alcohol abuse  Brother   . Alcohol abuse Brother   . Drug abuse Brother   . Colon cancer Neg Hx   . Esophageal cancer Neg Hx   . Stomach cancer Neg Hx   . Breast cancer Neg Hx     PE: LMP  (LMP Unknown)  There is no height or weight on file to calculate BMI. Wt Readings from Last 3 Encounters:  05/16/18 201 lb (91.2 kg)  05/12/18 201 lb (91.2 kg)  02/07/18 197 lb (89.4 kg)   Constitutional:  in NAD  The physical exam was not performed (virtual visit).  ASSESSMENT: 1. DM2, non-insulin dependent, without complications, controlled  2. HL  3. H/o Low TSH  4.  Low vitamin D   5. Low B12  PLAN:  1. Patient with history of controlled type 2 diabetes, only on low-dose metformin, without side effects.  In the past, she had significant improvement in her blood sugars while on the day meal fast.  However, she is now off the diet. -Her sugars are still great - she now checks less than before, 2x a day - no hyperglycemic spikes, however, she tells me she may have dietary indiscretions (candy) and she does not check sugars after these. -No need to change her regimen for now -Reviewed her latest HbA1c from 1.5 months ago, which was 6.2%, stable, at goal - I suggested to: Patient Instructions  Please continue: - Metformin ER 500 mg with dinner  Also, continue: - Ergocalciferol 50,000 units weekly - Livalo 2 mg daily - Zetia 10 mg daily  For now, until safe to come to the clinic, switch to 5000 mcg B12 daily.  Please return in 4-6 months with your sugar log.    - continue checking sugars at different times of the day - check 1-2x a day, rotating checks - advised for yearly eye exams >> she is UTD - Return to clinic in 4-6 mo with sugar log    2. HL - Reviewed latest lipid panel from 12/2017: LDL very high, at 164, despite Livalo.  We added Zetia at that time. Lab Results  Component Value Date   CHOL 231 (H) 12/30/2017   HDL 47.60 12/30/2017   LDLCALC 164 (H) 12/30/2017   TRIG 101.0  12/30/2017   CHOLHDL 5 12/30/2017  - Continues Livalo and Zetia without side effects -We will recheck her lipid panel when she returns to the clinic  3. Low TSH -Resolved per review of her TSH from a year ago which was normal -Repeat TFTs when she returns to the clinic  4. Low vitamin D  -Normal at last visit, but she had a repeat in 01/2018 and this was much lower pointing towards noncompliance with her medication -Not compliant with 50,000 units of ergocalciferol weekly >> strongly advised her to restart -Refilled ergocalciferol today  5. Low B12 -She is on B12 injections but not very compliant.  She was previously on p.o. B12 5000 mcg daily but was forgetting these -Latest B12 level was normal in 01/2018 -As of now, with the coronavirus pandemic, I advised her to go back to the 5000 mcg daily and  feels safe to come to the clinic for monthly injections  Philemon Kingdom, MD PhD [facility]

## 2018-07-04 NOTE — Patient Instructions (Signed)
Please continue: - Metformin ER 500 mg with dinner  Also, continue: - Ergocalciferol 50,000 units weekly - Livalo 2 mg daily - Zetia 10 mg daily  For now, until safe to come to the clinic, switch to 5000 mcg B12 daily.  Please return in 4-6 months with your sugar log.

## 2018-07-31 ENCOUNTER — Encounter: Payer: Self-pay | Admitting: Family Medicine

## 2018-08-08 MED FILL — ACCU-CHEK FASTCLIX LANCETS: 90 days supply | Qty: 102 | Fill #0

## 2018-08-08 MED FILL — ACCU-CHEK GUIDE TEST STRIP: 90 days supply | Qty: 100 | Fill #0

## 2018-08-08 MED FILL — ESTRADIOL 1 MG TABS: 1 | 30 days supply | Qty: 30 | Fill #0

## 2018-08-08 MED FILL — metFORMIN HCL ER 500 MG TB2: 500 | 90 days supply | Qty: 90 | Fill #0

## 2018-08-19 ENCOUNTER — Other Ambulatory Visit: Payer: Self-pay

## 2018-08-19 ENCOUNTER — Encounter: Payer: Self-pay | Admitting: Family Medicine

## 2018-08-19 ENCOUNTER — Telehealth (INDEPENDENT_AMBULATORY_CARE_PROVIDER_SITE_OTHER): Payer: No Typology Code available for payment source | Admitting: Family Medicine

## 2018-08-19 VITALS — BP 128/82 | Temp 97.9°F | Wt 205.0 lb

## 2018-08-19 DIAGNOSIS — M25511 Pain in right shoulder: Secondary | ICD-10-CM

## 2018-08-19 DIAGNOSIS — E785 Hyperlipidemia, unspecified: Secondary | ICD-10-CM

## 2018-08-19 DIAGNOSIS — E1165 Type 2 diabetes mellitus with hyperglycemia: Secondary | ICD-10-CM | POA: Diagnosis not present

## 2018-08-19 DIAGNOSIS — M5412 Radiculopathy, cervical region: Secondary | ICD-10-CM

## 2018-08-19 DIAGNOSIS — G542 Cervical root disorders, not elsewhere classified: Secondary | ICD-10-CM | POA: Diagnosis not present

## 2018-08-19 DIAGNOSIS — G8929 Other chronic pain: Secondary | ICD-10-CM

## 2018-08-19 NOTE — Assessment & Plan Note (Signed)
A1C Q6 months. Due in August. Continue f/u with endocrinology per her preference.

## 2018-08-19 NOTE — Assessment & Plan Note (Addendum)
On Livalo 2 mg qd and Zetia 10 mg qd. Management per endocrinologist. Will benefit from a high intensity Statin instead.

## 2018-08-19 NOTE — Progress Notes (Signed)
205lb 128/82 97.9 oral

## 2018-08-19 NOTE — Patient Instructions (Signed)
Shoulder Pain Many things can cause shoulder pain, including:  An injury.  Moving the shoulder in the same way again and again (overuse).  Joint pain (arthritis). Pain can come from:  Swelling and irritation (inflammation) of any part of the shoulder.  An injury to the shoulder joint.  An injury to: ? Tissues that connect muscle to bone (tendons). ? Tissues that connect bones to each other (ligaments). ? Bones. Follow these instructions at home: Watch for changes in your symptoms. Let your doctor know about them. Follow these instructions to help with your pain. If you have a sling:  Wear the sling as told by your doctor. Remove it only as told by your doctor.  Loosen the sling if your fingers: ? Tingle. ? Become numb. ? Turn cold and blue.  Keep the sling clean.  If the sling is not waterproof: ? Do not let it get wet. ? Take the sling off when you shower or bathe. Managing pain, stiffness, and swelling   If told, put ice on the painful area: ? Put ice in a plastic bag. ? Place a towel between your skin and the bag. ? Leave the ice on for 20 minutes, 2-3 times a day. Stop putting ice on if it does not help with the pain.  Squeeze a soft ball or a foam pad as much as possible. This prevents swelling in the shoulder. It also helps to strengthen the arm. General instructions  Take over-the-counter and prescription medicines only as told by your doctor.  Keep all follow-up visits as told by your doctor. This is important. Contact a doctor if:  Your pain gets worse.  Medicine does not help your pain.  You have new pain in your arm, hand, or fingers. Get help right away if:  Your arm, hand, or fingers: ? Tingle. ? Are numb. ? Are swollen. ? Are painful. ? Turn white or blue. Summary  Shoulder pain can be caused by many things. These include injury, moving the shoulder in the same away again and again, and joint pain.  Watch for changes in your symptoms.  Let your doctor know about them.  This condition may be treated with a sling, ice, and pain medicine.  Contact your doctor if the pain gets worse or you have new pain. Get help right away if your arm, hand, or fingers tingle or get numb, swollen, or painful.  Keep all follow-up visits as told by your doctor. This is important. This information is not intended to replace advice given to you by your health care provider. Make sure you discuss any questions you have with your health care provider. Document Released: 08/22/2007 Document Revised: 09/17/2017 Document Reviewed: 09/17/2017 Elsevier Interactive Patient Education  2019 [employer]  

## 2018-08-19 NOTE — Assessment & Plan Note (Signed)
Recurrent and worsening. ?? Rotator cuff tear vs cervical radiculopathy. Cervical spine Xray done in 2019 reviewed indicating + C-spine nerve impingement B/L, worse on the right. Ortho and PT referral placed. NeuroSurg referral also placed for nerve impingement. NSAID or Tylenol as needed for pain. F/U soon if symptoms worsens.

## 2018-08-19 NOTE — Progress Notes (Signed)
Patient ID: [name], female   DOB: 01/08/1961, 58 y.o.   MRN: [id number] [facility] Telemedicine Visit  Patient consented to have virtual visit. Method of visit: Video attempted but converted to telephone visit due to technical difficulty.  Encounter participants: Patient: [name] - located at Home Provider: Andrena Mews - located at Office Others (if applicable): NA  Chief Complaint: Shoulder pain  HPI:  Shoulder Pain   The pain is present in the right shoulder. This is a chronic problem. The current episode started more than 1 year ago. There has been no history of extremity trauma. The problem occurs constantly (Pain improved a while and now worse). The problem has been gradually worsening. The pain is at a severity of 10/10. The pain is severe. Associated symptoms include a limited range of motion. Pertinent negatives include no joint swelling, numbness, stiffness or tingling. Associated symptoms comments: Denies right arm weakness, but endorsed left arm weakness which she attributed to carpal tunnel.. The symptoms are aggravated by heat. She has tried NSAIDS for the symptoms. The treatment provided mild relief.  DM2/HLD:Compliant with her meds. She stated that her endocrinologist recommended A1C check Q6 months since she has been well controlled. Denies any concern related to her DM or HLD.  ROS: per HPI  Pertinent PMHx: Problem list reviewed.  Exam:  Respiratory: No distress, she could complete sentences without difficulty.  Assessment/Plan:  Right shoulder pain Recurrent and worsening. ?? Rotator cuff tear vs cervical radiculopathy. Cervical spine Xray done in 2019 reviewed indicating + C-spine nerve impingement B/L, worse on the right. Ortho and PT referral placed. NeuroSurg referral also placed for nerve impingement. NSAID or Tylenol as needed for pain. F/U soon if symptoms worsens.  Diabetes mellitus, type 2 (HCC) A1C Q6  months. Due in August. Continue f/u with endocrinology per her preference.  Hyperlipidemia On Livalo 2 mg qd and Zetia 10 mg qd. Management per endocrinologist. Will benefit from a high intensity Statin instead.    Time spent during visit with patient: 16 minutes

## 2018-08-25 DIAGNOSIS — M25511 Pain in right shoulder: Secondary | ICD-10-CM | POA: Insufficient documentation

## 2018-08-25 HISTORY — DX: Pain in right shoulder: M25.511

## 2018-08-27 ENCOUNTER — Ambulatory Visit: Payer: No Typology Code available for payment source | Admitting: Physical Therapy

## 2018-09-02 ENCOUNTER — Encounter: Payer: Self-pay | Admitting: Obstetrics and Gynecology

## 2018-09-02 ENCOUNTER — Other Ambulatory Visit: Payer: Self-pay

## 2018-09-02 ENCOUNTER — Ambulatory Visit (INDEPENDENT_AMBULATORY_CARE_PROVIDER_SITE_OTHER): Payer: No Typology Code available for payment source | Admitting: Obstetrics and Gynecology

## 2018-09-02 VITALS — BP 118/78 | HR 80 | Temp 97.0°F | Resp 14 | Ht 64.0 in | Wt 209.0 lb

## 2018-09-02 DIAGNOSIS — N3281 Overactive bladder: Secondary | ICD-10-CM

## 2018-09-02 DIAGNOSIS — R350 Frequency of micturition: Secondary | ICD-10-CM | POA: Diagnosis not present

## 2018-09-02 DIAGNOSIS — N951 Menopausal and female climacteric states: Secondary | ICD-10-CM | POA: Diagnosis not present

## 2018-09-02 DIAGNOSIS — N898 Other specified noninflammatory disorders of vagina: Secondary | ICD-10-CM

## 2018-09-02 LAB — POCT URINALYSIS DIPSTICK
Bilirubin, UA: NEGATIVE
Blood, UA: 1
Glucose, UA: NEGATIVE
Ketones, UA: NEGATIVE
Nitrite, UA: NEGATIVE
Protein, UA: NEGATIVE
Urobilinogen, UA: 0.2 E.U./dL
pH, UA: 5 (ref 5.0–8.0)

## 2018-09-02 MED ORDER — OXYBUTYNIN CHLORIDE ER 10 MG PO TB24: 10.0000 mg | ORAL_TABLET | Freq: Every day | ORAL | 3 refills | Status: DC

## 2018-09-02 MED ORDER — VENLAFAXINE HCL ER 37.5 MG PO CP24: 37.5000 mg | ORAL_CAPSULE | Freq: Every day | ORAL | 1 refills | Status: DC

## 2018-09-02 MED ORDER — NITROFURANTOIN MONOHYD MACRO 100 MG PO CAPS: 100.0000 mg | ORAL_CAPSULE | Freq: Two times a day (BID) | ORAL | 0 refills | Status: DC

## 2018-09-02 MED FILL — NITROFURANTOIN MONO-MCR 100: 100 | 5 days supply | Qty: 10 | Fill #0

## 2018-09-02 MED FILL — VENLAFAXINE HCL ER 37.5 MG: 37.5 | 30 days supply | Qty: 30 | Fill #0

## 2018-09-02 MED FILL — OXYBUTYNIN CL ER 10 MG TAB: 10 | 30 days supply | Qty: 30 | Fill #0

## 2018-09-02 NOTE — Progress Notes (Signed)
GYNECOLOGY  VISIT   HPI: 58 y.o.   Married  [place] American  female   [phone] with No LMP recorded (lmp unknown). Patient has had a hysterectomy.   here for bladder problems; urinary frequency, urgency, pain with urination. Patient has been drinking plenty of water and taking AZO.    Symptoms started 5 days ago.  Feeling a lot of pressure with voiding.  Urine very dark and with odor.  Got some relief from AZO.  No blood in her urine.  Some bilateral mid back pain.  No fever or chills.  No nausea or vomiting.  Decrease appetite.   On Ditropan XL for overactive bladder.  Needs refills.   Denies vaginal drainage. Notes some odor.   Still with hot flashes.  On Estrace 1 mg daily.  Urine dip - small RBC, trace WBC.   GYNECOLOGIC HISTORY: No LMP recorded (lmp unknown). Patient has had a hysterectomy. Contraception:  Hysterectomy Menopausal hormone therapy:  Estrace 1mg   Last mammogram:  12-13-17 3D Neg/density C/BiRads1 Last pap smear:    11-12-16 Neg, 10-14-13 Neg:Neg HR HPV        OB History    Gravida  3   Para  2   Term  2   Preterm      AB  1   Living  2     SAB      TAB      Ectopic      Multiple      Live Births  2              Patient Active Problem List   Diagnosis Date Noted  . Carpal tunnel syndrome of left wrist 05/16/2018  . Bilateral knee pain 02/07/2018  . Pain in joint, ankle and foot 09/10/2017  . Cervical disc disorder with radiculopathy of cervical region 12/11/2016  . Low vitamin B12 level 12/04/2016  . Vitamin D deficiency 12/04/2016  . Low TSH level 12/04/2016  . Right shoulder pain 08/29/2016  . Brain tumor (benign) ([place]) 03/19/2016  . Sleep apnea 05/27/2015  . Solitary pulmonary nodule 05/27/2015  . Hot flashes 05/27/2015  . GERD (gastroesophageal reflux disease) 05/13/2015  . Diabetes mellitus, type 2 ([place]) 04/12/2015  . Urine, incontinence, stress female 06/18/2014  . Depression with anxiety 01/22/2014  . VAIN I  (vaginal intraepithelial neoplasia grade I)   . Hyperlipidemia 10/22/2013  . Migraine headache 10/04/2008    Past Medical History:  Diagnosis Date  . Anxiety   . Arthritis   . Brain tumor (benign) ([place]) 2018  . Chest pain 05/15/2015  . Depression   . GERD (gastroesophageal reflux disease)   . H/O varicella   . History of measles, mumps, or rubella   . HSV-2 (herpes simplex virus 2) infection   . Hx of ovarian cyst   . Hyperlipidemia   . Hypersomnia 07/19/2015  . Migraine headache   . Plantar fasciitis   . Scapular dyskinesis 10/03/2016  . Sleep apnea    pt denies  . Sleep deprivation    loss of sleep  . VAIN I (vaginal intraepithelial neoplasia grade I) 2013   pap and confirmed by colposcopic biopsy    Past Surgical History:  Procedure Laterality Date  . ABDOMINAL HYSTERECTOMY    . CHOLECYSTECTOMY  11/13/10  . COLONOSCOPY  Multiple  . DILATION AND CURETTAGE OF UTERUS    . ESOPHAGOGASTRODUODENOSCOPY    . OOPHORECTOMY  2009   bilateral salpingo-oophorectomy.  . TUBAL LIGATION  Current Outpatient Medications  Medication Sig Dispense Refill  . ACCU-CHEK FASTCLIX LANCETS MISC USE AS DIRECTED TO CHECK BLOOD SUGAR ONCE DAILY 102 each 5  . ACCU-CHEK GUIDE test strip USE AS DIRECTED TO CHECK BLOOD SUGAR ONCE DAILY 100 each 5  . Elastic Bandages & Supports (WRIST BRACE/LEFT MEDIUM) MISC 1 each by Does not apply route as needed. 1 each 0  . estradiol (ESTRACE) 1 MG tablet TAKE ONE TABLET (1 mg) BY MOUTH ONCE DAILY. 30 tablet 8  . ezetimibe (ZETIA) 10 MG tablet Take 1 tablet (10 mg total) by mouth daily. 90 tablet 3  . ibuprofen (ADVIL,MOTRIN) 600 MG tablet Take 1 tablet (600 mg total) by mouth every 8 (eight) hours as needed. 90 tablet 0  . metFORMIN (GLUCOPHAGE-XR) 500 MG 24 hr tablet TAKE 1 TABLET BY MOUTH DAILY WITH A MEAL. 180 tablet 3  . oxybutynin (DITROPAN-XL) 10 MG 24 hr tablet Take 1 tablet (10 mg total) by mouth at bedtime. 30 tablet 3  . Pitavastatin Calcium  (LIVALO) 2 MG TABS Use 1x a day 90 tablet 3  . Vitamin D, Ergocalciferol, (DRISDOL) 1.25 MG (50000 UT) CAPS capsule Take 1 capsule (50,000 Units total) by mouth every 7 (seven) days. 12 capsule 3  . nitrofurantoin, macrocrystal-monohydrate, (MACROBID) 100 MG capsule Take 1 capsule (100 mg total) by mouth 2 (two) times daily. Take for 5 days. 10 capsule 0  . phentermine 37.5 MG capsule Take 1 capsule (37.5 mg total) by mouth every morning. (Patient not taking: Reported on 08/19/2018) 30 capsule 0  . venlafaxine XR (EFFEXOR XR) 37.5 MG 24 hr capsule Take 1 capsule (37.5 mg total) by mouth daily. 30 capsule 1   No current facility-administered medications for this visit.      ALLERGIES: Sulfa antibiotics  Family History  Problem Relation Age of Onset  . Hypertension Mother   . Hypotension Mother   . Anemia Mother        low iron  . Heart disease Sister   . Sickle cell trait Other   . Alcohol abuse Brother   . Alcohol abuse Brother   . Drug abuse Brother   . Colon cancer Neg Hx   . Esophageal cancer Neg Hx   . Stomach cancer Neg Hx   . Breast cancer Neg Hx     Social History   Socioeconomic History  . Marital status: Married    Spouse name: Not on file  . Number of children: Not on file  . Years of education: Not on file  . Highest education level: Not on file  Occupational History  . Not on file  Social Needs  . Financial resource strain: Not on file  . Food insecurity    Worry: Not on file    Inability: Not on file  . Transportation needs    Medical: Not on file    Non-medical: Not on file  Tobacco Use  . Smoking status: Former Smoker    Packs/day: 0.50    Types: Cigarettes  . Smokeless tobacco: Never Used  Substance and Sexual Activity  . Alcohol use: No    Alcohol/week: 0.0 standard drinks  . Drug use: No  . Sexual activity: Yes    Partners: Male    Birth control/protection: Surgical    Comment: Hysterectomy  Lifestyle  . Physical activity    Days per week:  Not on file    Minutes per session: Not on file  . Stress: Not on file  Relationships  .  Social Herbalist on phone: Not on file    Gets together: Not on file    Attends religious service: Not on file    Active member of club or organization: Not on file    Attends meetings of clubs or organizations: Not on file    Relationship status: Not on file  . Intimate partner violence    Fear of current or ex partner: Not on file    Emotionally abused: Not on file    Physically abused: Not on file    Forced sexual activity: Not on file  Other Topics Concern  . Not on file  Social History Narrative   Patient lives at home with her husband [name]). 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 Two children. Both boys    Review of Systems  Constitutional: Negative.   HENT: Negative.   Eyes: Negative.   Respiratory: Negative.   Cardiovascular: Negative.   Gastrointestinal: Negative.   Endocrine: Negative.   Genitourinary: Positive for frequency and urgency.       Pain with urination  Musculoskeletal: Negative.   Skin: Negative.   Allergic/Immunologic: Negative.   Neurological: Negative.   Hematological: Negative.   Psychiatric/Behavioral: Negative.     PHYSICAL EXAMINATION:    BP 118/78 (BP Location: Left Arm, Patient Position: Sitting, Cuff Size: Large)   Pulse 80   Temp (!) 97 F (36.1 C) (Temporal)   Resp 14   Ht 5\' 4"  (1.626 m)   Wt 209 lb (94.8 kg)   LMP  (LMP Unknown)   BMI 35.87 kg/m     General appearance: alert, cooperative and appears stated age  Pelvic: External genitalia:  no lesions              Urethra:  normal appearing urethra with no masses, tenderness or lesions              Bartholins and Skenes: normal                 Vagina: normal appearing vagina with normal color and discharge, no lesions              Cervix:  absent                Bimanual Exam:  Uterus: absent               Adnexa: no mass, fullness, tenderness              Chaperone was present for exam.  ASSESSMENT  Dysuria.  Vaginal odor.  Menopausal symptoms.  Partially controlled on Estrace.  Overactive bladder.  On Ditropan XL.  PLAN  Urine micro and culture.  Macrobid 100 mg po bid x 5 days.  Ok to continue AZO for one more day.  Hydrate well. Return if no improvement in 48 hours.  Affirm.  We discussed Neurontin and SSRIs/SNRIs. She will start Effexor XR 37.5 mg daily.  #30, RF one.  She will let me know how she is doing on this dosage.  Refills of Ditropan XL 10 mg daily until annual exam is due.   Fu for annual exam and prn.   An After Visit Summary was printed and given to the patient.  __25_____ minutes face to face time of which over 50% was spent in counseling.

## 2018-09-02 NOTE — Patient Instructions (Addendum)
Urinary Tract Infection, Adult A urinary tract infection (UTI) is an infection of any part of the urinary tract. The urinary tract includes:  The kidneys.  The ureters.  The bladder.  The urethra. These organs make, store, and get rid of pee (urine) in the body. What are the causes? This is caused by germs (bacteria) in your genital area. These germs grow and cause swelling (inflammation) of your urinary tract. What increases the risk? You are more likely to develop this condition if:  You have a small, thin tube (catheter) to drain pee.  You cannot control when you pee or poop (incontinence).  You are female, and: ? You use these methods to prevent pregnancy: ? A medicine that kills sperm (spermicide). ? A device that blocks sperm (diaphragm). ? You have low levels of a female hormone (estrogen). ? You are pregnant.  You have genes that add to your risk.  You are sexually active.  You take antibiotic medicines.  You have trouble peeing because of: ? A prostate that is bigger than normal, if you are female. ? A blockage in the part of your body that drains pee from the bladder (urethra). ? A kidney stone. ? A nerve condition that affects your bladder (neurogenic bladder). ? Not getting enough to drink. ? Not peeing often enough.  You have other conditions, such as: ? Diabetes. ? A weak disease-fighting system (immune system). ? Sickle cell disease. ? Gout. ? Injury of the spine. What are the signs or symptoms? Symptoms of this condition include:  Needing to pee right away (urgently).  Peeing often.  Peeing small amounts often.  Pain or burning when peeing.  Blood in the pee.  Pee that smells bad or not like normal.  Trouble peeing.  Pee that is cloudy.  Fluid coming from the vagina, if you are female.  Pain in the belly or lower back. Other symptoms include:  Throwing up (vomiting).  No urge to eat.  Feeling mixed up (confused).  Being tired  and grouchy (irritable).  A fever.  Watery poop (diarrhea). How is this treated? This condition may be treated with:  Antibiotic medicine.  Other medicines.  Drinking enough water. Follow these instructions at home:  Medicines  Take over-the-counter and prescription medicines only as told by your doctor.  If you were prescribed an antibiotic medicine, take it as told by your doctor. Do not stop taking it even if you start to feel better. General instructions  Make sure you: ? Pee until your bladder is empty. ? Do not hold pee for a long time. ? Empty your bladder after sex. ? Wipe from front to back after pooping if you are a female. Use each tissue one time when you wipe.  Drink enough fluid to keep your pee pale yellow.  Keep all follow-up visits as told by your doctor. This is important. Contact a doctor if:  You do not get better after 1-2 days.  Your symptoms go away and then come back. Get help right away if:  You have very bad back pain.  You have very bad pain in your lower belly.  You have a fever.  You are sick to your stomach (nauseous).  You are throwing up. Summary  A urinary tract infection (UTI) is an infection of any part of the urinary tract.  This condition is caused by germs in your genital area.  There are many risk factors for a UTI. These include having a small, thin   tube to drain pee and not being able to control when you pee or poop.  Treatment includes antibiotic medicines for germs.  Drink enough fluid to keep your pee pale yellow. This information is not intended to replace advice given to you by your health care provider. Make sure you discuss any questions you have with your health care provider. Document Released: 08/22/2007 Document Revised: 09/12/2017 Document Reviewed: 09/12/2017 Elsevier Interactive Patient Education  2019 Elsevier Inc.   Venlafaxine extended-release capsules What is this medicine? VENLAFAXINE(VEN la  fax een) is used to treat depression, anxiety and panic disorder. This medicine may be used for other purposes; ask your health care provider or pharmacist if you have questions. COMMON BRAND NAME(S): Effexor XR What should I tell my health care provider before I take this medicine? They need to know if you have any of these conditions: -bleeding disorders -glaucoma -heart disease -high blood pressure -high cholesterol -kidney disease -liver disease -low levels of sodium in the blood -mania or bipolar disorder -seizures -suicidal thoughts, plans, or attempt; a previous suicide attempt by you or a family -take medicines that treat or prevent blood clots -thyroid disease -an unusual or allergic reaction to venlafaxine, desvenlafaxine, other medicines, foods, dyes, or preservatives -pregnant or trying to get pregnant -breast-feeding How should I use this medicine? Take this medicine by mouth with a full glass of water. Follow the directions on the prescription label. Do not cut, crush, or chew this medicine. Take it with food. If needed, the capsule may be carefully opened and the entire contents sprinkled on a spoonful of cool applesauce. Swallow the applesauce/pellet mixture right away without chewing and follow with a glass of water to ensure complete swallowing of the pellets. Try to take your medicine at about the same time each day. Do not take your medicine more often than directed. Do not stop taking this medicine suddenly except upon the advice of your doctor. Stopping this medicine too quickly may cause serious side effects or your condition may worsen. A special MedGuide will be given to you by the pharmacist with each prescription and refill. Be sure to read this information carefully each time. Talk to your pediatrician regarding the use of this medicine in children. Special care may be needed. Overdosage: If you think you have taken too much of this medicine contact a poison  control center or emergency room at once. NOTE: This medicine is only for you. Do not share this medicine with others. What if I miss a dose? If you miss a dose, take it as soon as you can. If it is almost time for your next dose, take only that dose. Do not take double or extra doses. What may interact with this medicine? Do not take this medicine with any of the following medications: -certain medicines for fungal infections like fluconazole, itraconazole, ketoconazole, posaconazole, voriconazole -cisapride -desvenlafaxine -dofetilide -dronedarone -duloxetine -levomilnacipran -linezolid -MAOIs like Carbex, Eldepryl, Marplan, Nardil, and Parnate -methylene blue (injected into a vein) -milnacipran -pimozide -thioridazine -ziprasidone This medicine may also interact with the following medications: -amphetamines -aspirin and aspirin-like medicines -certain medicines for depression, anxiety, or psychotic disturbances -certain medicines for migraine headaches like almotriptan, eletriptan, frovatriptan, naratriptan, rizatriptan, sumatriptan, zolmitriptan -certain medicines for sleep -certain medicines that treat or prevent blood clots like dalteparin, enoxaparin, warfarin -cimetidine -clozapine -diuretics -fentanyl -furazolidone -indinavir -isoniazid -lithium -metoprolol -NSAIDS, medicines for pain and inflammation, like ibuprofen or naproxen -other medicines that prolong the QT interval (cause an abnormal heart rhythm) -procarbazine -  rasagiline -supplements like St. John's wort, kava kava, valerian -tramadol -tryptophan This list may not describe all possible interactions. Give your health care provider a list of all the medicines, herbs, non-prescription drugs, or dietary supplements you use. Also tell them if you smoke, drink alcohol, or use illegal drugs. Some items may interact with your medicine. What should I watch for while using this medicine? Tell your doctor if your  symptoms do not get better or if they get worse. Visit your doctor or health care professional for regular checks on your progress. Because it may take several weeks to see the full effects of this medicine, it is important to continue your treatment as prescribed by your doctor. Patients and their families should watch out for new or worsening thoughts of suicide or depression. Also watch out for sudden changes in feelings such as feeling anxious, agitated, panicky, irritable, hostile, aggressive, impulsive, severely restless, overly excited and hyperactive, or not being able to sleep. If this happens, especially at the beginning of treatment or after a change in dose, call your health care professional. This medicine can cause an increase in blood pressure. Check with your doctor for instructions on monitoring your blood pressure while taking this medicine. You may get drowsy or dizzy. Do not drive, use machinery, or do anything that needs mental alertness until you know how this medicine affects you. Do not stand or sit up quickly, especially if you are an older patient. This reduces the risk of dizzy or fainting spells. Alcohol may interfere with the effect of this medicine. Avoid alcoholic drinks. Your mouth may get dry. Chewing sugarless gum, sucking hard candy and drinking plenty of water will help. Contact your doctor if the problem does not go away or is severe. What side effects may I notice from receiving this medicine? Side effects that you should report to your doctor or health care professional as soon as possible: -allergic reactions like skin rash, itching or hives, swelling of the face, lips, or tongue -anxious -breathing problems -confusion -changes in vision -chest pain -confusion -elevated mood, decreased need for sleep, racing thoughts, impulsive behavior -eye pain -fast, irregular heartbeat -feeling faint or lightheaded, falls -feeling agitated, angry, or  irritable -hallucination, loss of contact with reality -high blood pressure -loss of balance or coordination -palpitations -redness, blistering, peeling or loosening of the skin, including inside the mouth -restlessness, pacing, inability to keep still -seizures -stiff muscles -suicidal thoughts or other mood changes -trouble passing urine or change in the amount of urine -trouble sleeping -unusual bleeding or bruising -unusually weak or tired -vomiting Side effects that usually do not require medical attention (report to your doctor or health care professional if they continue or are bothersome): -change in sex drive or performance -change in appetite or weight -constipation -dizziness -dry mouth -headache -increased sweating -nausea -tired This list may not describe all possible side effects. Call your doctor for medical advice about side effects. You may report side effects to FDA at 1-800-FDA-1088. Where should I keep my medicine? Keep out of the reach of children. Store at a controlled temperature between 20 and 25 degrees C (68 degrees and 77 degrees F), in a dry place. Throw away any unused medicine after the expiration date. NOTE: This sheet is a summary. It may not cover all possible information. If you have questions about this medicine, talk to your doctor, pharmacist, or health care provider.  2019 Elsevier/Gold Standard (2015-08-04 18:38:02)

## 2018-09-03 LAB — URINE CULTURE

## 2018-09-03 LAB — VAGINITIS/VAGINOSIS, DNA PROBE
Candida Species: NEGATIVE
Gardnerella vaginalis: NEGATIVE
Trichomonas vaginosis: NEGATIVE

## 2018-09-05 LAB — URINALYSIS, MICROSCOPIC ONLY
Bacteria, UA: NONE SEEN
Casts: NONE SEEN /lpf
RBC: NONE SEEN /hpf (ref 0–2)

## 2018-09-09 ENCOUNTER — Other Ambulatory Visit: Payer: Self-pay | Admitting: Family Medicine

## 2018-09-09 ENCOUNTER — Telehealth: Payer: Self-pay | Admitting: *Deleted

## 2018-09-09 MED ORDER — GABAPENTIN 100 MG PO CAPS: 100.0000 mg | ORAL_CAPSULE | Freq: Three times a day (TID) | ORAL | 1 refills | Status: DC

## 2018-09-09 MED FILL — GABAPENTIN 100 MG CAPSULE: 100 | 30 days supply | Qty: 90 | Fill #0

## 2018-09-09 NOTE — Telephone Encounter (Signed)
Pt wants to know if she can have some pain med for her shoulder.  She was seen for this a few weeks ago.  Offered appt but she would like a request sent to Dr. Gwendlyn Deutscher first. Christen Bame, CMA

## 2018-09-09 NOTE — Telephone Encounter (Signed)
Please call to inform patient that I will escribe Gabapentin for her pain, since she has a neuropathic pain. She can continue Tylenol or Ibuprofen as needed as well. Thanks.

## 2018-09-09 NOTE — Telephone Encounter (Signed)
Patient informed and has already picked up medication.  Jazmin Hartsell,CMA

## 2018-09-17 ENCOUNTER — Other Ambulatory Visit: Payer: Self-pay

## 2018-09-17 ENCOUNTER — Telehealth (INDEPENDENT_AMBULATORY_CARE_PROVIDER_SITE_OTHER): Payer: No Typology Code available for payment source | Admitting: Family Medicine

## 2018-09-17 DIAGNOSIS — G43801 Other migraine, not intractable, with status migrainosus: Secondary | ICD-10-CM | POA: Diagnosis not present

## 2018-09-17 MED ORDER — BUTALBITAL-APAP-CAFFEINE 50-325-40 MG PO TABS: 1.0000 | ORAL_TABLET | Freq: Four times a day (QID) | ORAL | 0 refills | Status: DC | PRN

## 2018-09-17 MED FILL — BUTALB-ACETAMIN-CAFF 50-325: 50-325-40 | 3 days supply | Qty: 20 | Fill #0

## 2018-09-17 NOTE — Assessment & Plan Note (Signed)
Typical migraine headache with hx of migraines and No red flag sx. Last migraine was 1 year ago. Does not like Imitrex when she tried it in the past. Previously required topamax but has not been on this in quite some time.  - fioricet given for current migraine as it has previously helped, has zofran at home for nausea - Return to clinic if migraine persists - Red flag symptoms and reasons to go to the hospital discussed - Consider restarting  prophylactic medication if migraine recurs

## 2018-09-17 NOTE — Progress Notes (Signed)
[facility] Telemedicine Visit  Patient consented to have virtual visit. Method of visit: Telephone  Encounter participants: Patient: [name] - located at home Provider: [place] Sheniah Supak - located at home Others (if applicable): n/a  Chief Complaint: migraines  HPI: Headache started 4 days ago Pain is described as a pressure Location: bilateral front head Medications tried: Excedrin migraine, but it has not touched it Head trauma: no Sudden onset: no, started as mild and then progressively got worse Previous similar headaches: last migraine was about 1 year ago. Taking blood thinners: no History of cancer: no  Symptoms Nose congestion stuffiness: None Nausea vomiting: some nausea, but no vomiting Photophobia: yes Noise sensitivity: Yes Double vision or loss of vision: No Fever: No Neck Stiffness: No Trouble walking or speaking: No  Patient thinks cause of headache might be migraines. She has a history of migraines and says this feels the same.  Has tried Imitrex in the past and did not like how it made her feel.  Her last migraine was 1 year ago. She was previously on topamax, has been to headache specialist- she stopped taking this because she stopped going to the neurologist.   ROS: per HPI  Pertinent PMHx: migraines  Exam:  Respiratory: speaking in complete sentences with no issue  Assessment/Plan:  Migraine headache Typical migraine headache with hx of migraines and No red flag sx. Last migraine was 1 year ago. Does not like Imitrex when she tried it in the past. Previously required topamax but has not been on this in quite some time.  - fioricet given for current migraine as it has previously helped, has zofran at home for nausea - Return to clinic if migraine persists - Red flag symptoms and reasons to go to the hospital discussed - Consider restarting  prophylactic medication if migraine recurs     Time spent during visit  with patient: 12 minutes

## 2018-10-03 ENCOUNTER — Ambulatory Visit: Payer: No Typology Code available for payment source | Admitting: Family Medicine

## 2018-10-07 ENCOUNTER — Encounter: Payer: Self-pay | Admitting: Family Medicine

## 2018-10-07 ENCOUNTER — Ambulatory Visit (INDEPENDENT_AMBULATORY_CARE_PROVIDER_SITE_OTHER): Payer: No Typology Code available for payment source | Admitting: Family Medicine

## 2018-10-07 ENCOUNTER — Other Ambulatory Visit: Payer: Self-pay

## 2018-10-07 VITALS — BP 132/70 | HR 71

## 2018-10-07 DIAGNOSIS — R030 Elevated blood-pressure reading, without diagnosis of hypertension: Secondary | ICD-10-CM | POA: Diagnosis not present

## 2018-10-07 DIAGNOSIS — G43801 Other migraine, not intractable, with status migrainosus: Secondary | ICD-10-CM | POA: Diagnosis not present

## 2018-10-07 DIAGNOSIS — E1165 Type 2 diabetes mellitus with hyperglycemia: Secondary | ICD-10-CM

## 2018-10-07 DIAGNOSIS — R232 Flushing: Secondary | ICD-10-CM | POA: Insufficient documentation

## 2018-10-07 LAB — POCT UA - MICROALBUMIN
Creatinine, POC: 50 mg/dL
Microalbumin Ur, POC: 10 mg/L

## 2018-10-07 MED ORDER — MAGNESIUM OXIDE -MG SUPPLEMENT 400 (240 MG) MG PO TABS: 400.0000 mg | ORAL_TABLET | Freq: Every day | ORAL | 0 refills | Status: AC

## 2018-10-07 MED ORDER — TOPIRAMATE 50 MG PO TABS: 50.0000 mg | ORAL_TABLET | Freq: Two times a day (BID) | ORAL | 1 refills | Status: DC

## 2018-10-07 MED FILL — TOPIRAMATE 50 MG TABLET: 50 | 30 days supply | Qty: 60 | Fill #0

## 2018-10-07 MED FILL — METHOCARBAMOL 750 MG TABS: 750 | 15 days supply | Qty: 45 | Fill #0

## 2018-10-07 NOTE — Assessment & Plan Note (Signed)
Currently symptoms improved but present today. Usually waxes and wanes. No neurologic deficit on exam. Restart Topamax which she had done well on in the past. Take Magnesium oxide 400 mg daily for about 1-2 weeks. Use home Ibuprofen 600mg  prn for breakthrough headache. D/C Fioricet. F/U soon if there is no improvement or if develops worsening of her symptoms to go to the ED. She agreed with the plan.

## 2018-10-07 NOTE — Assessment & Plan Note (Signed)
I advised it might take 4-8 weeks for her to start seeing the effect of her Effexor. She will f/u with Endocrinologist for further management.

## 2018-10-07 NOTE — Assessment & Plan Note (Signed)
Compliant with meds and Endo f/u.Marland Kitchen Urine Microalbumin completed during this visit. Not on ACEi since her BP has been low normal, hence requires routine microalb check. F/U next month with Endo for A1C check.

## 2018-10-07 NOTE — Patient Instructions (Signed)

## 2018-10-07 NOTE — Assessment & Plan Note (Signed)
I rechecked her BP after rest with some improvement. May be due to her Migraine. Monitor BP closely for now without medication management. Consider ACEi if it persists.

## 2018-10-07 NOTE — Progress Notes (Signed)
Subjective:     Patient ID: [name], female   DOB: 06/18/1960, 58 y.o.   MRN: [id number]  Migraine  This is a chronic problem. The current episode started more than 1 year ago (most recent episode started about 3 weeks ago). The problem occurs constantly. The problem has been waxing and waning. The pain is located in the bilateral and frontal region. Radiates to: Right ear. The pain quality is similar to prior headaches (Similar to her migraine). The quality of the pain is described as aching and dull. The pain is at a severity of 7/10 (Could get to as bad as more than 10/10 in severity). The pain is moderate. Associated symptoms include nausea and phonophobia. Pertinent negatives include no dizziness, eye pain, eye redness, eye watering, fever, loss of balance, numbness, rhinorrhea, seizures, tinnitus, visual change or vomiting. The symptoms are aggravated by noise [place] bothers her. She drinks a cup coffee most morning). Treatments tried: Fioricet, OTC migraine relieve. Last dose of Imitrex was many years ago but she had s/es to it. The treatment provided mild relief. Her past medical history is significant for migraine headaches.  Tried Topamax which helped in the past. DM2: Compliant with her meds and Endocrine f/u. No new concern today. Urine microalbum/Foot exam Hot flashes: Her estrogen was d/c and she was started on Effexor by her endocrinologist. She had been on Effexor for about 3 weeks. However, her symptoms worsened since she had been off estrogen. Elevated BP: No hx of HTN.  Current Outpatient Medications on File Prior to Visit  Medication Sig Dispense Refill  . ezetimibe (ZETIA) 10 MG tablet Take 1 tablet (10 mg total) by mouth daily. 90 tablet 3  . metFORMIN (GLUCOPHAGE-XR) 500 MG 24 hr tablet TAKE 1 TABLET BY MOUTH DAILY WITH A MEAL. 180 tablet 3  . oxybutynin (DITROPAN-XL) 10 MG 24 hr tablet Take 1 tablet (10 mg total) by mouth at bedtime. 30 tablet 3  . Pitavastatin  Calcium (LIVALO) 2 MG TABS Use 1x a day 90 tablet 3  . venlafaxine XR (EFFEXOR XR) 37.5 MG 24 hr capsule Take 1 capsule (37.5 mg total) by mouth daily. 30 capsule 1  . Vitamin D, Ergocalciferol, (DRISDOL) 1.25 MG (50000 UT) CAPS capsule Take 1 capsule (50,000 Units total) by mouth every 7 (seven) days. 12 capsule 3  . ACCU-CHEK FASTCLIX LANCETS MISC USE AS DIRECTED TO CHECK BLOOD SUGAR ONCE DAILY 102 each 5  . ACCU-CHEK GUIDE test strip USE AS DIRECTED TO CHECK BLOOD SUGAR ONCE DAILY 100 each 5  . Elastic Bandages & Supports (WRIST BRACE/LEFT MEDIUM) MISC 1 each by Does not apply route as needed. 1 each 0  . estradiol (ESTRACE) 1 MG tablet TAKE ONE TABLET (1 mg) BY MOUTH ONCE DAILY. (Patient not taking: Reported on 10/07/2018) 30 tablet 8  . gabapentin (NEURONTIN) 100 MG capsule Take 1 capsule (100 mg total) by mouth 3 (three) times daily. (Patient not taking: Reported on 10/07/2018) 90 capsule 1  . ibuprofen (ADVIL,MOTRIN) 600 MG tablet Take 1 tablet (600 mg total) by mouth every 8 (eight) hours as needed. (Patient not taking: Reported on 10/07/2018) 90 tablet 0   No current facility-administered medications on file prior to visit.    Past Medical History:  Diagnosis Date  . Anxiety   . Arthritis   . Brain tumor (benign) ([place]) 2018  . Chest pain 05/15/2015  . Depression   . GERD (gastroesophageal reflux disease)   . H/O varicella   .  History of measles, mumps, or rubella   . HSV-2 (herpes simplex virus 2) infection   . Hx of ovarian cyst   . Hyperlipidemia   . Hypersomnia 07/19/2015  . Migraine headache   . Plantar fasciitis   . Scapular dyskinesis 10/03/2016  . Sleep apnea    pt denies  . Sleep deprivation    loss of sleep  . 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  HENT:  Negative for rhinorrhea and tinnitus.   Eyes: Negative for pain and redness.  Respiratory: Negative.   Cardiovascular: Negative.   Gastrointestinal: Positive for nausea. Negative for vomiting.  Neurological: Positive for headaches. Negative for dizziness, tremors, seizures, syncope, light-headedness, numbness and loss of balance.  Psychiatric/Behavioral: Negative.   All other systems reviewed and are negative.      Objective:   Physical Exam Vitals signs and nursing note reviewed.  Constitutional:      Appearance: She is not ill-appearing.  Cardiovascular:     Rate and Rhythm: Normal rate and regular rhythm.     Heart sounds: Normal heart sounds. No murmur. No gallop.   Pulmonary:     Effort: No respiratory distress.     Breath sounds: Normal breath sounds. No wheezing or rhonchi.  Abdominal:     General: Abdomen is flat. Bowel sounds are normal. There is no distension.     Palpations: Abdomen is soft. There is no mass.     Tenderness: There is no abdominal tenderness.  Musculoskeletal:     Comments: Sensory exam of the foot is normal, tested with the monofilament. Good pulses, no lesions or ulcers, good peripheral pulses.    Neurological:     General: No focal deficit present.     Mental Status: She is alert.     Cranial Nerves: Cranial nerves are intact.     Sensory: Sensation is intact.     Motor: Motor function is intact.     Coordination: Coordination is intact.     Gait: Gait is intact.     Deep Tendon Reflexes: Reflexes normal. Babinski sign absent on the right side. Babinski sign absent on the left side.        Assessment:     Migraine headache DM2 Hot Flashes Elevated BP    Plan:     Check problem list

## 2018-10-14 ENCOUNTER — Encounter: Payer: Self-pay | Admitting: Family Medicine

## 2018-10-14 ENCOUNTER — Other Ambulatory Visit: Payer: Self-pay

## 2018-10-14 ENCOUNTER — Telehealth (INDEPENDENT_AMBULATORY_CARE_PROVIDER_SITE_OTHER): Payer: No Typology Code available for payment source | Admitting: Family Medicine

## 2018-10-14 DIAGNOSIS — G43801 Other migraine, not intractable, with status migrainosus: Secondary | ICD-10-CM

## 2018-10-14 DIAGNOSIS — G43809 Other migraine, not intractable, without status migrainosus: Secondary | ICD-10-CM

## 2018-10-14 NOTE — Assessment & Plan Note (Signed)
Symptoms persists despite treatment. I will not change her pharmacologic agents at this point. Continue Topamax as instructed and complete 2 weeks of Magnesium oxide. At this point, due to hx of benign brain tumor, I recommended f/u with neuro. Referral placed. Red flag signs and indication for ED visit discussed.  Unclear if symptoms is viral in origin. I recommended COVID-19 testing which she stated she had done 3 days ago. She will contact me with her result.

## 2018-10-14 NOTE — Progress Notes (Signed)
[facility] Telemedicine Visit  Patient consented to have virtual visit. Method of visit: Video  Encounter participants: Patient: [name] - located at Work Provider: Andrena Mews - located at Office Others (if applicable): N/A  Chief Complaint: Headache  HPI:  The patient is compliant with Topamax and Magnesium oxide. Despite this, she still has a daily headache. Her headache radiates to her right shoulder and neck.No vision change, no weakness or change in her speech. She mentioned that her orthopedic thinks her headache is from her neck, and she has an appointment coming up with them tomorrow. She got tested for COVID-19  three days ago at her church. She still does not have a result.  ROS: per HPI  Pertinent PMHx: Problem list reviewed.  Physical Exam Constitutional:      General: She is not in acute distress.    Appearance: Normal appearance. She is not ill-appearing.  Pulmonary:     Effort: Pulmonary effort is normal. No respiratory distress.  Neurological:     Mental Status: She is alert and oriented to person, place, and time.     Comments: I was unable to fully assess with video visit. However, there was no facial asymmetry or slurring of her speech.      Assessment/Plan:  Migraine headache Symptoms persists despite treatment. I will not change her pharmacologic agents at this point. Continue Topamax as instructed and complete 2 weeks of Magnesium oxide. At this point, due to hx of benign brain tumor, I recommended f/u with neuro. Referral placed. Red flag signs and indication for ED visit discussed.  Unclear if symptoms is viral in origin. I recommended COVID-19 testing which she stated she had done 3 days ago. She will contact me with her result.    Time spent during visit with patient: 15 minutes

## 2018-10-16 ENCOUNTER — Emergency Department (HOSPITAL_COMMUNITY)
Admission: EM | Admit: 2018-10-16 | Discharge: 2018-10-16 | Discharge status: Against Medical Advice | Payer: No Typology Code available for payment source

## 2018-10-16 ENCOUNTER — Encounter (HOSPITAL_COMMUNITY): Payer: Self-pay

## 2018-10-16 ENCOUNTER — Other Ambulatory Visit: Payer: Self-pay

## 2018-10-16 ENCOUNTER — Emergency Department (HOSPITAL_COMMUNITY): Payer: No Typology Code available for payment source

## 2018-10-16 ENCOUNTER — Emergency Department (HOSPITAL_COMMUNITY)
Admission: EM | Admit: 2018-10-16 | Discharge: 2018-10-17 | Disposition: A | Discharge status: Home / Self Care | Payer: No Typology Code available for payment source | Attending: Emergency Medicine | Admitting: Emergency Medicine

## 2018-10-16 DIAGNOSIS — Z85841 Personal history of malignant neoplasm of brain: Secondary | ICD-10-CM | POA: Insufficient documentation

## 2018-10-16 DIAGNOSIS — Z7984 Long term (current) use of oral hypoglycemic drugs: Secondary | ICD-10-CM | POA: Diagnosis not present

## 2018-10-16 DIAGNOSIS — Z87891 Personal history of nicotine dependence: Secondary | ICD-10-CM | POA: Diagnosis not present

## 2018-10-16 DIAGNOSIS — R51 Headache: Secondary | ICD-10-CM | POA: Diagnosis not present

## 2018-10-16 DIAGNOSIS — R519 Headache, unspecified: Secondary | ICD-10-CM

## 2018-10-16 LAB — CBC WITH DIFFERENTIAL/PLATELET
Abs Immature Granulocytes: 0.02 10*3/uL (ref 0.00–0.07)
Basophils Absolute: 0 10*3/uL (ref 0.0–0.1)
Basophils Relative: 0 %
Eosinophils Absolute: 0.1 10*3/uL (ref 0.0–0.5)
Eosinophils Relative: 2 %
HCT: 42.3 % (ref 36.0–46.0)
Hemoglobin: 13.6 g/dL (ref 12.0–15.0)
Immature Granulocytes: 0 %
Lymphocytes Relative: 41 %
Lymphs Abs: 3.6 10*3/uL (ref 0.7–4.0)
MCH: 28.8 pg (ref 26.0–34.0)
MCHC: 32.2 g/dL (ref 30.0–36.0)
MCV: 89.6 fL (ref 80.0–100.0)
Monocytes Absolute: 0.6 10*3/uL (ref 0.1–1.0)
Monocytes Relative: 7 %
Neutro Abs: 4.3 10*3/uL (ref 1.7–7.7)
Neutrophils Relative %: 50 %
Platelets: 333 10*3/uL (ref 150–400)
RBC: 4.72 MIL/uL (ref 3.87–5.11)
RDW: 14.8 % (ref 11.5–15.5)
WBC: 8.7 10*3/uL (ref 4.0–10.5)
nRBC: 0 % (ref 0.0–0.2)

## 2018-10-16 LAB — BASIC METABOLIC PANEL
Anion gap: 9 (ref 5–15)
BUN: 21 mg/dL — ABNORMAL HIGH (ref 6–20)
CO2: 22 mmol/L (ref 22–32)
Calcium: 9.3 mg/dL (ref 8.9–10.3)
Chloride: 108 mmol/L (ref 98–111)
Creatinine, Ser: 1.03 mg/dL — ABNORMAL HIGH (ref 0.44–1.00)
GFR calc Af Amer: >60 mL/min (ref >60)
GFR calc non Af Amer: >60 mL/min (ref >60)
Glucose, Bld: 105 mg/dL — ABNORMAL HIGH (ref 70–99)
Potassium: 3.8 mmol/L (ref 3.5–5.1)
Sodium: 139 mmol/L (ref 135–145)

## 2018-10-16 MED ORDER — DIPHENHYDRAMINE HCL 50 MG/ML IJ SOLN
12.5000 mg | Freq: Once | INTRAMUSCULAR | Status: AC
  Administered 2018-10-16: 12.5 mg via INTRAVENOUS
  Filled 2018-10-16: qty 1

## 2018-10-16 MED ORDER — DEXAMETHASONE SODIUM PHOSPHATE 10 MG/ML IJ SOLN
10.0000 mg | Freq: Once | INTRAMUSCULAR | Status: AC
  Administered 2018-10-16: 10 mg via INTRAVENOUS
  Filled 2018-10-16: qty 1

## 2018-10-16 MED ORDER — MAGNESIUM SULFATE IN D5W 1-5 GM/100ML-% IV SOLN
1.0000 g | Freq: Once | INTRAVENOUS | Status: AC
  Administered 2018-10-16: 1 g via INTRAVENOUS
  Filled 2018-10-16: qty 100

## 2018-10-16 MED ORDER — KETOROLAC TROMETHAMINE 15 MG/ML IJ SOLN
15.0000 mg | Freq: Once | INTRAMUSCULAR | Status: AC
  Administered 2018-10-16: 15 mg via INTRAVENOUS
  Filled 2018-10-16: qty 1

## 2018-10-16 MED ORDER — PROCHLORPERAZINE EDISYLATE 10 MG/2ML IJ SOLN
10.0000 mg | Freq: Once | INTRAMUSCULAR | Status: AC
  Administered 2018-10-16: 10 mg via INTRAVENOUS
  Filled 2018-10-16: qty 2

## 2018-10-16 NOTE — ED Notes (Addendum)
Pt did not want to wait. Pt left and decided to go to [facility] ED.

## 2018-10-16 NOTE — ED Provider Notes (Signed)
[facility] DEPT Provider Note   CSN: [id number] Arrival date & time: 10/16/18  2012    History   Chief Complaint No chief complaint on file.   HPI [name] is a 58 y.o. female.     The history is provided by the patient and medical records. No language interpreter was used.   [name] is a 58 y.o. female who presents to the Emergency Department complaining of persistent headache for the last 3 weeks.  Waxing and waning in intensity.  Seen by her primary care doctor on the 21st of this month for same.  She was started on Topamax as well as mag oxide.  She has been taking this medicine with little improvement.  She reached out to them about persistent headaches and was referred to neurology.  She has not seen them yet.  She denies fever or chills.  No neck pain.  No visual changes, numbness or weakness.   Past Medical History:  Diagnosis Date  . Anxiety   . Arthritis   . Brain tumor (benign) ([place]) 2018  . Chest pain 05/15/2015  . Depression   . GERD (gastroesophageal reflux disease)   . H/O varicella   . History of measles, mumps, or rubella   . HSV-2 (herpes simplex virus 2) infection   . Hx of ovarian cyst   . Hyperlipidemia   . Hypersomnia 07/19/2015  . Migraine headache   . Plantar fasciitis   . Scapular dyskinesis 10/03/2016  . Sleep apnea    pt denies  . Sleep deprivation    loss of sleep  . 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 Hot flashes 10/07/2018  . Elevated BP without diagnosis of hypertension 10/07/2018  . Carpal tunnel syndrome of left wrist 05/16/2018  . Bilateral knee pain 02/07/2018  . Cervical disc disorder with radiculopathy of cervical region 12/11/2016  . Low vitamin B12 level 12/04/2016  . Vitamin D deficiency 12/04/2016  . Low TSH level 12/04/2016  . Right shoulder pain 08/29/2016   . Brain tumor (benign) ([place]) 03/19/2016  . Sleep apnea 05/27/2015  . Solitary pulmonary nodule 05/27/2015  . GERD (gastroesophageal reflux disease) 05/13/2015  . Diabetes mellitus, type 2 ([place]) 04/12/2015  . Depression with anxiety 01/22/2014  . VAIN I (vaginal intraepithelial neoplasia grade I)   . Hyperlipidemia 10/22/2013  . Migraine headache 10/04/2008    Past Surgical History:  Procedure Laterality Date  . ABDOMINAL HYSTERECTOMY    . CHOLECYSTECTOMY  11/13/10  . COLONOSCOPY  Multiple  . DILATION AND CURETTAGE OF UTERUS    . ESOPHAGOGASTRODUODENOSCOPY    . OOPHORECTOMY  2009   bilateral salpingo-oophorectomy.  . TUBAL LIGATION       OB History    Gravida  3   Para  2   Term  2   Preterm      AB  1   Living  2     SAB      TAB      Ectopic      Multiple      Live Births  2            Home Medications    Prior to Admission medications   Medication Sig Start Date End Date Taking? Authorizing Provider  ezetimibe (ZETIA) 10 MG tablet Take 1 tablet (10 mg total) by  mouth daily. 01/02/18  Yes Philemon Kingdom, MD  gabapentin (NEURONTIN) 100 MG capsule Take 1 capsule (100 mg total) by mouth 3 (three) times daily. 09/09/18  Yes [name], MD  Magnesium Oxide 400 (240 Mg) MG TABS Take 1 tablet (400 mg total) by mouth daily for 14 days. 10/07/18 10/21/18 Yes [name], MD  metFORMIN (GLUCOPHAGE-XR) 500 MG 24 hr tablet TAKE 1 TABLET BY MOUTH DAILY WITH A MEAL. Patient taking differently: Take 500 mg by mouth daily with breakfast.  12/30/17  Yes Philemon Kingdom, MD  oxybutynin (DITROPAN-XL) 10 MG 24 hr tablet Take 1 tablet (10 mg total) by mouth at bedtime. 09/02/18  Yes Nunzio Cobbs, MD  Pitavastatin Calcium (LIVALO) 2 MG TABS Use 1x a day Patient taking differently: Take 2 mg by mouth daily. Use 1x a day 07/04/18  Yes Philemon Kingdom, MD  topiramate (TOPAMAX) 50 MG tablet Take 1 tablet (50 mg total) by mouth 2 (two) times daily.  10/07/18  Yes [name], MD  venlafaxine XR (EFFEXOR XR) 37.5 MG 24 hr capsule Take 1 capsule (37.5 mg total) by mouth daily. 09/02/18  Yes Amundson Raliegh Ip, MD  Vitamin D, Ergocalciferol, (DRISDOL) 1.25 MG (50000 UT) CAPS capsule Take 1 capsule (50,000 Units total) by mouth every 7 (seven) days. 07/04/18  Yes Philemon Kingdom, MD  ACCU-CHEK FASTCLIX LANCETS [place] USE AS DIRECTED TO CHECK BLOOD SUGAR ONCE DAILY 12/20/17   Philemon Kingdom, MD  ACCU-CHEK GUIDE test strip USE AS DIRECTED TO CHECK BLOOD SUGAR ONCE DAILY 12/20/17   Philemon Kingdom, MD  Elastic Bandages & Supports (WRIST BRACE/LEFT MEDIUM) MISC 1 each by Does not apply route as needed. 05/16/18   Guadalupe Dawn, MD  estradiol (ESTRACE) 1 MG tablet TAKE ONE TABLET (1 mg) BY MOUTH ONCE DAILY. Patient not taking: Reported on 10/07/2018 04/18/18   Nunzio Cobbs, MD  ibuprofen (ADVIL,MOTRIN) 600 MG tablet Take 1 tablet (600 mg total) by mouth every 8 (eight) hours as needed. Patient not taking: Reported on 10/07/2018 02/07/18   [name], MD    Family History Family History  Problem Relation Age of Onset  . Hypertension Mother   . Hypotension Mother   . Anemia Mother        low iron  . Heart disease Sister   . Sickle cell trait Other   . Alcohol abuse Brother   . Alcohol abuse Brother   . Drug abuse Brother   . Colon cancer Neg Hx   . Esophageal cancer Neg Hx   . Stomach cancer Neg Hx   . Breast cancer Neg Hx     Social History Social History   Tobacco Use  . Smoking status: Former Smoker    Packs/day: 0.50    Types: Cigarettes  . Smokeless tobacco: Never Used  Substance Use Topics  . Alcohol use: No    Alcohol/week: 0.0 standard drinks  . Drug use: No     Allergies   Sulfa antibiotics   Review of Systems Review of Systems  Eyes: Positive for photophobia.  Neurological: Positive for headaches. Negative for dizziness, syncope, weakness and numbness.  All other systems reviewed  and are negative.    Physical Exam Updated Vital Signs BP 126/80   Pulse 76   Temp 98.7 F (37.1 C) (Oral)   Resp (!) 27   LMP  (LMP Unknown)   SpO2 100%   Physical Exam Vitals signs and nursing note  reviewed.  Constitutional:      General: She is not in acute distress.    Appearance: She is well-developed. She is not diaphoretic.  HENT:     Head: Normocephalic and atraumatic.  Eyes:     General: No scleral icterus.    Conjunctiva/sclera: Conjunctivae normal.     Pupils: Pupils are equal, round, and reactive to light.     Comments: No nystagmus   Neck:     Musculoskeletal: Normal range of motion and neck supple.     Comments: Full active and passive ROM without pain.  No midline or paraspinal tenderness. No nuchal rigidity or meningeal signs. Cardiovascular:     Rate and Rhythm: Normal rate and regular rhythm.     Heart sounds: Normal heart sounds.  Pulmonary:     Effort: Pulmonary effort is normal. No respiratory distress.     Breath sounds: Normal breath sounds. No wheezing or rales.  Abdominal:     General: Bowel sounds are normal. There is no distension.     Palpations: Abdomen is soft.     Tenderness: There is no abdominal tenderness. There is no guarding or rebound.  Musculoskeletal: Normal range of motion.  Lymphadenopathy:     Cervical: No cervical adenopathy.  Skin:    General: Skin is warm and dry.     Findings: No rash.  Neurological:     Mental Status: She is alert and oriented to person, place, and time.     Cranial Nerves: No cranial nerve deficit.     Coordination: Coordination normal.     Deep Tendon Reflexes: Reflexes are normal and symmetric.     Comments: Alert, oriented, thought content appropriate, able to give a coherent history. Speech is clear and goal oriented, able to follow commands.  Cranial Nerves:  II:  Peripheral visual fields grossly normal, pupils equal, round, reactive to light III, IV, VI: EOM intact bilaterally, ptosis not  present V,VII: smile symmetric, eyes kept closed tightly against resistance, facial light touch sensation equal VIII: hearing grossly normal IX, X: symmetric soft palate movement, uvula elevates symmetrically  XI: bilateral shoulder shrug symmetric and strong XII: midline tongue extension 5/5 muscle strength in upper and lower extremities bilaterally including strong and equal grip strength and dorsiflexion/plantar flexion Sensory to light touch normal in all four extremities.  Normal finger-to-nose and rapid alternating movements.      ED Treatments / Results  Labs (all labs ordered are listed, but only abnormal results are displayed) Labs Reviewed  BASIC METABOLIC PANEL - Abnormal; Notable for the following components:      Result Value   Glucose, Bld 105 (*)    BUN 21 (*)    Creatinine, Ser 1.03 (*)    All other components within normal limits  CBC WITH DIFFERENTIAL/PLATELET    EKG None  Radiology Ct Head Wo Contrast  Result Date: 10/16/2018 CLINICAL DATA:  Headache high blood pressure EXAM: CT HEAD WITHOUT CONTRAST TECHNIQUE: Contiguous axial images were obtained from the base of the skull through the vertex without intravenous contrast. COMPARISON:  MRI 12/29/2016 FINDINGS: Brain: No evidence of acute infarction, hemorrhage, hydrocephalus, extra-axial collection or mass lesion/mass effect. Vascular: No hyperdense vessel or unexpected calcification. Skull: Normal. Negative for fracture or focal lesion. Sinuses/Orbits: No acute finding. Other: None IMPRESSION: Negative non contrasted CT appearance of the brain Electronically Signed   By: Donavan Foil M.D.   On: 10/16/2018 22:07    Procedures Procedures (including critical care time)  Medications Ordered  in ED Medications  magnesium sulfate IVPB 1 g 100 mL (0 g Intravenous Stopped 10/16/18 2355)  dexamethasone (DECADRON) injection 10 mg (10 mg Intravenous Given 10/16/18 2249)  ketorolac (TORADOL) 15 MG/ML injection 15 mg  (15 mg Intravenous Given 10/16/18 2245)  diphenhydrAMINE (BENADRYL) injection 12.5 mg (12.5 mg Intravenous Given 10/16/18 2252)  prochlorperazine (COMPAZINE) injection 10 mg (10 mg Intravenous Given 10/16/18 2246)     Initial Impression / Assessment and Plan / ED Course  I have reviewed the triage vital signs and the nursing notes.  Pertinent labs & imaging results that were available during my care of the patient were reviewed by me and considered in my medical decision making (see chart for details).     [name] is a 58 y.o. female who presents to ED for headache. No focal neuro deficits on exam. CT head negative. No fevers/neck pain to suggest meningitis. Migraine cocktail and fluids given. On re-evaluation, patient feels improved. Agree with PCP to have neuro follow up.  I feel that the patient is safe for discharge home at this time. I have reviewed return precautions. All questions answered.   Final Clinical Impressions(s) / ED Diagnoses   Final diagnoses:  Bad headache    ED Discharge Orders    None       , Ozella Almond, PA-C 10/17/18 0002    Deno Etienne, DO 10/20/18 [id number]

## 2018-10-16 NOTE — ED Triage Notes (Signed)
Pt complains of high blood pressure, no history of, also states that she's had a migraine for about three weeks

## 2018-10-17 NOTE — Discharge Instructions (Signed)
It was my pleasure taking care of you today!   Call the neurology clinic listed to schedule a follow-up appointment.  If you develop worsening headache, new fever, new neck stiffness, rash, weakness, numbness, trouble with your speech, trouble walking, new or worsening symptoms or any concerning symptoms, please return to the ED immediately.

## 2018-10-20 ENCOUNTER — Other Ambulatory Visit: Payer: Self-pay | Admitting: Family Medicine

## 2018-10-20 ENCOUNTER — Telehealth: Payer: Self-pay

## 2018-10-20 ENCOUNTER — Telehealth: Payer: Self-pay | Admitting: Family Medicine

## 2018-10-20 DIAGNOSIS — G43909 Migraine, unspecified, not intractable, without status migrainosus: Secondary | ICD-10-CM

## 2018-10-20 NOTE — Telephone Encounter (Signed)
Patient called nurse line stating she wants to be seen by Dr. Posey Pronto at [facility]. I contacted their office and a referral needs to be placed. Please advise.

## 2018-10-20 NOTE — Telephone Encounter (Signed)
Will forward to MD. Jazmin Hartsell,CMA  

## 2018-10-20 NOTE — Progress Notes (Signed)
neuro

## 2018-10-20 NOTE — Telephone Encounter (Signed)
Referral order placed.

## 2018-10-20 NOTE — Telephone Encounter (Signed)
Pt called to see is she could get a referral to [facility]. Please give pt a call back, pt states that she had to go to the hospital due to not getting a message back via MyChart.

## 2018-10-20 NOTE — Telephone Encounter (Signed)
Order placed. Will forward to [place].

## 2018-10-21 NOTE — Progress Notes (Signed)
Virtual Visit via Video Note The purpose of this virtual visit is to provide medical care while limiting exposure to the novel coronavirus.    Consent was obtained for video visit:  Yes.   Answered questions that patient had about telehealth interaction:  Yes.   I discussed the limitations, risks, security and privacy concerns of performing an evaluation and management service by telemedicine. I also discussed with the patient that there may be a patient responsible charge related to this service. The patient expressed understanding and agreed to proceed.  Pt location: Home Physician Location: Home Name of referring provider:  [name], MD I connected with [name] at patients initiation/request on 10/22/2018 at 11:10 AM EDT by video enabled telemedicine application and verified that I am speaking with the correct person using two identifiers. Pt MRN:  [id number] Pt DOB:  20-Aug-1960 Video Participants:  [name]   History of Present Illness:  [name] is a 58 year old female who presents for migraines.  History supplemented by ED, prior neurologist, and referring provider notes.  Unable to connect via video so switched to phone visit.  She has history of migraines since her early 38s.  They typically are top/frontal headaches associated with nausea, photophobia and phonophobia, lasting 3 to 5 days and occurring every 3 to 6 months.  About 3 weeks ago, she began not feeling well.  She started having a moderate constant pressure and throbbing headache on the right side of her head as well as intermittent sharp stabbing pains in the right side of her head, radiating to the right ear.  Yesterday, she notes some left sided pain as well.  Some associated mild nausea.  She has no appetite.  She notes cloudy vision but no double vision.  No dizziness, slurred speech, fever.    She was evaluated in the ED on 10/16/18.  CT head personally reviewed and was negative.   She was treated with headache cocktail (magnesium sulfate 1gm, Decadron 10mg , Toradol 15mg , Benadryl 12.5mg , Compazine 10mg ).  Headache resolved but soon recurred.    She also has history of chronic neck pain with radiating pain into the right shoulder.  MRI of cervical spine from 10/12/17 showed asymmetric right facet degeneration at C3-4 (with foraminal impingement), C4-5 (with foraminal impingement) and C7-T1 as well as some disc degeneration at C5-6 and C6-7.  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  Current NSAIDS:  none Current analgesics:  none Current triptans:  none Current ergotamine:  none Current anti-emetic:  none Current muscle relaxants:  none Current anti-anxiolytic:  none Current sleep aide:  none Current Antihypertensive medications:  none Current Antidepressant medications:  Venlafaxine XR 37.5mg  Current Anticonvulsant medications:  topiramate 50mg  twice daily (started on 7/21) Current anti-CGRP:  none Current Vitamins/Herbal/Supplements:  Magnesium oxide 400mg  Current Antihistamines/Decongestants:  none Other therapy:  none Hormone/birth control:  estradiol  Past NSAIDS:  Indomethacin, naproxen, ibuprofen Past steroids:  prednisone Past analgesics:  Tylenol, Tylenol #3, Fioricet, Midrin Past abortive triptans:  Axert, Imitrex Past abortive ergotamine:  none Past muscle relaxants:  Flexeril Past anti-emetic:  none Past antihypertensive medications:  none Past antidepressant medications:  amitriptyline Past anticonvulsant medications:  zonisamide 25mg , lamotrigine Past anti-CGRP:  none Past vitamins/Herbal/Supplements:  none Past antihistamines/decongestants:  none Other past therapies:  none   Past Medical  History: Past Medical History:  Diagnosis Date   Anxiety    Arthritis    Brain tumor  (benign) ([place]) 2018   Chest pain 05/15/2015   Depression    GERD (gastroesophageal reflux disease)    H/O varicella    History of measles, mumps, or rubella    HSV-2 (herpes simplex virus 2) infection    Hx of ovarian cyst    Hyperlipidemia    Hypersomnia 07/19/2015   Migraine headache    Plantar fasciitis    Scapular dyskinesis 10/03/2016   Sleep apnea    pt denies   Sleep deprivation    loss of sleep   VAIN I (vaginal intraepithelial neoplasia grade I) 2013   pap and confirmed by colposcopic biopsy    Medications: Outpatient Encounter Medications as of 10/22/2018  Medication Sig Note   ACCU-CHEK FASTCLIX LANCETS MISC USE AS DIRECTED TO CHECK BLOOD SUGAR ONCE DAILY    ACCU-CHEK GUIDE test strip USE AS DIRECTED TO CHECK BLOOD SUGAR ONCE DAILY    Elastic Bandages & Supports (WRIST BRACE/LEFT MEDIUM) MISC 1 each by Does not apply route as needed.    estradiol (ESTRACE) 1 MG tablet TAKE ONE TABLET (1 mg) BY MOUTH ONCE DAILY. (Patient not taking: Reported on 10/07/2018)    ezetimibe (ZETIA) 10 MG tablet Take 1 tablet (10 mg total) by mouth daily.    gabapentin (NEURONTIN) 100 MG capsule Take 1 capsule (100 mg total) by mouth 3 (three) times daily.    ibuprofen (ADVIL,MOTRIN) 600 MG tablet Take 1 tablet (600 mg total) by mouth every 8 (eight) hours as needed. (Patient not taking: Reported on 10/07/2018)    Magnesium Oxide 400 (240 Mg) MG TABS Take 1 tablet (400 mg total) by mouth daily for 14 days.    metFORMIN (GLUCOPHAGE-XR) 500 MG 24 hr tablet TAKE 1 TABLET BY MOUTH DAILY WITH A MEAL. (Patient taking differently: Take 500 mg by mouth daily with breakfast. ) 10/16/2018: Due at 7 PM   oxybutynin (DITROPAN-XL) 10 MG 24 hr tablet Take 1 tablet (10 mg total) by mouth at bedtime.    Pitavastatin Calcium (LIVALO) 2 MG TABS Use 1x a day (Patient taking differently: Take 2 mg by mouth daily. Use 1x a day)    topiramate (TOPAMAX) 50 MG tablet Take 1 tablet (50 mg total) by  mouth 2 (two) times daily.    venlafaxine XR (EFFEXOR XR) 37.5 MG 24 hr capsule Take 1 capsule (37.5 mg total) by mouth daily.    Vitamin D, Ergocalciferol, (DRISDOL) 1.25 MG (50000 UT) CAPS capsule Take 1 capsule (50,000 Units total) by mouth every 7 (seven) days. 10/16/2018: Saturdays    No facility-administered encounter medications on file as of 10/22/2018.     Allergies: Allergies  Allergen Reactions   Sulfa Antibiotics Anaphylaxis, Hives and Swelling    Family History: Family History  Problem Relation Age of Onset   Hypertension Mother    Hypotension Mother    Anemia Mother        low iron   Heart disease Sister    Sickle cell trait Other    Alcohol abuse Brother    Alcohol abuse Brother    Drug abuse Brother    Colon cancer Neg Hx    Esophageal cancer Neg Hx    Stomach cancer Neg Hx    Breast cancer Neg Hx     Social History: Social History   Socioeconomic History   Marital status: Married    Spouse name: Not  on file   Number of children: Not on file   Years of education: Not on file   Highest education level: Not on file  Occupational History   Not on file  Social Needs   Financial resource strain: Not on file   Food insecurity    Worry: Not on file    Inability: Not on file   Transportation needs    Medical: Not on file    Non-medical: Not on file  Tobacco Use   Smoking status: Former Smoker    Packs/day: 0.50    Types: Cigarettes   Smokeless tobacco: Never Used  Substance and Sexual Activity   Alcohol use: No    Alcohol/week: 0.0 standard drinks   Drug use: No   Sexual activity: Yes    Partners: Male    Birth control/protection: Surgical    Comment: Hysterectomy  Lifestyle   Physical activity    Days per week: Not on file    Minutes per session: Not on file   Stress: Not on file  Relationships   Social connections    Talks on phone: Not on file    Gets together: Not on file    Attends religious service: Not  on file    Active member of club or organization: Not on file    Attends meetings of clubs or organizations: Not on file    Relationship status: Not on file   Intimate partner violence    Fear of current or ex partner: Not on file    Emotionally abused: Not on file    Physically abused: Not on file    Forced sexual activity: Not on file  Other Topics Concern   Not on file  Social History Narrative   Patient lives at home with her husband [name]). 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 Two children. Both boys    Observations/Objective:   Temperature (!) 96 F (35.6 C), height 5\' 5"  (1.651 m), weight 196 lb (88.9 kg). No acute distress.  Alert and oriented.  Speech fluent and not dysarthric.  Language intact.    Assessment and Plan:   1.  New onset persistent headache, not similar to her previous migraines.  Unclear etiology.  Given the MRI cervical findings and right arm symptoms, may be cervicogenic (C3-4).   2.  Cervical disc disease with right sided radiculopathy  1.  Check MRI of brain with and without contrast and MRA of head and neck for new onset headache in patient over 50. 2.  Check Sed rate 3.  Advised to follow up with her eye doctor for repeat exam. 4.  NCV-EMG right upper extremity 5.  Continue topiramate 50mg  twice daily 6.  Follow up in office after testing.  Follow Up Instructions:    -I discussed the assessment and treatment plan with the patient. The patient was provided an opportunity to ask questions and all were answered. The patient agreed with the plan and demonstrated an understanding of the instructions.   The patient was advised to call back or seek an in-person evaluation if the symptoms worsen or if the condition fails to improve as anticipated.    Total Time spent in visit with the patient was:  26 minuts.  Dudley Major, DO

## 2018-10-22 ENCOUNTER — Other Ambulatory Visit: Payer: Self-pay

## 2018-10-22 ENCOUNTER — Encounter: Payer: Self-pay | Admitting: Neurology

## 2018-10-22 ENCOUNTER — Telehealth (INDEPENDENT_AMBULATORY_CARE_PROVIDER_SITE_OTHER): Payer: No Typology Code available for payment source | Admitting: Neurology

## 2018-10-22 VITALS — Temp 96.0°F | Ht 65.0 in | Wt 196.0 lb

## 2018-10-22 DIAGNOSIS — G4452 New daily persistent headache (NDPH): Secondary | ICD-10-CM | POA: Diagnosis not present

## 2018-10-22 DIAGNOSIS — R519 Headache, unspecified: Secondary | ICD-10-CM

## 2018-10-22 DIAGNOSIS — M5010 Cervical disc disorder with radiculopathy, unspecified cervical region: Secondary | ICD-10-CM

## 2018-11-05 ENCOUNTER — Telehealth: Payer: Self-pay | Admitting: Neurology

## 2018-11-05 DIAGNOSIS — R519 Headache, unspecified: Secondary | ICD-10-CM

## 2018-11-05 NOTE — Telephone Encounter (Signed)
Pt states that she is wanting to see what the status of the referral to have a MRI please call she states that she saw Dr Tomi Likens a couple of weeks ago

## 2018-11-06 NOTE — Telephone Encounter (Signed)
I was stop in the elevator by this patient she is wondering when her orders for MRI will be place. She complains of headaches 5 times a day. Pt work number [phone] she is requesting a call back concerning referral and MRI orders

## 2018-11-06 NOTE — Telephone Encounter (Signed)
Husband is now calling in wanting to know what is going on with the referral for the MRI. She hasn't heard anything. He said to call him back on his # at [phone] or Ardath's # at [phone]. They just want an update on whats going on. Thanks!

## 2018-11-06 NOTE — Telephone Encounter (Signed)
~  Dr. Tomi Likens reviewed your note Please advise on what patient needs

## 2018-11-06 NOTE — Telephone Encounter (Signed)
I would like to check an MRI of brain with and without contrast and MRA of head to look for a secondary etiology of new onset headache in a woman over 50, such as intracranial mass lesion or intracranial vascular abnormality such as cerebral aneurysm.

## 2018-11-07 MED FILL — EZETIMIBE 10 MG TABS: 10 | 90 days supply | Qty: 90 | Fill #1

## 2018-11-07 MED FILL — TOPIRAMATE 50 MG TABLET: 50 | 30 days supply | Qty: 60 | Fill #1

## 2018-11-07 MED FILL — LIVALO 2 MG TABLET: 2 | 90 days supply | Qty: 90 | Fill #0

## 2018-11-07 NOTE — Telephone Encounter (Signed)
Order place  Pt is aware of this

## 2018-11-13 ENCOUNTER — Encounter: Payer: No Typology Code available for payment source | Admitting: Neurology

## 2018-11-14 ENCOUNTER — Ambulatory Visit: Payer: No Typology Code available for payment source | Admitting: Family Medicine

## 2018-11-17 ENCOUNTER — Telehealth: Payer: Self-pay | Admitting: Internal Medicine

## 2018-11-17 NOTE — Telephone Encounter (Signed)
Patient scheduled for 11-18-2018. Ebunoluwa Gernert,CMA

## 2018-11-17 NOTE — Telephone Encounter (Signed)
Please contact this patient to help her schedule f/u with me for paresthesia of her feet. Thanks.

## 2018-11-17 NOTE — Telephone Encounter (Signed)
Send to PCP?  Please advise.

## 2018-11-17 NOTE — Telephone Encounter (Signed)
Patient called our office and stated that she is having burning in her feet that has been going on for 2 weeks now.  Patient stated that she has an appointment thursday with neuro for a nerve conductive test due to headaches that have been going on for 1 month now.  She stated she is having several issues going on and not sure if they all tie in together   She would really like to know what she should do.    Does patient need to contact PCP?   C/B# [place]

## 2018-11-17 NOTE — Telephone Encounter (Signed)
I would discuss with neurology about the burning and with PCP with other issues. Let me know if sugars are abnormal.

## 2018-11-18 ENCOUNTER — Ambulatory Visit (INDEPENDENT_AMBULATORY_CARE_PROVIDER_SITE_OTHER): Payer: No Typology Code available for payment source | Admitting: Family Medicine

## 2018-11-18 ENCOUNTER — Other Ambulatory Visit: Payer: Self-pay

## 2018-11-18 ENCOUNTER — Encounter: Payer: Self-pay | Admitting: Family Medicine

## 2018-11-18 VITALS — BP 128/80 | HR 72

## 2018-11-18 DIAGNOSIS — E559 Vitamin D deficiency, unspecified: Secondary | ICD-10-CM

## 2018-11-18 DIAGNOSIS — E1165 Type 2 diabetes mellitus with hyperglycemia: Secondary | ICD-10-CM | POA: Diagnosis not present

## 2018-11-18 DIAGNOSIS — Z23 Encounter for immunization: Secondary | ICD-10-CM

## 2018-11-18 DIAGNOSIS — Z114 Encounter for screening for human immunodeficiency virus [HIV]: Secondary | ICD-10-CM

## 2018-11-18 DIAGNOSIS — R232 Flushing: Secondary | ICD-10-CM

## 2018-11-18 DIAGNOSIS — G43801 Other migraine, not intractable, with status migrainosus: Secondary | ICD-10-CM

## 2018-11-18 DIAGNOSIS — Z113 Encounter for screening for infections with a predominantly sexual mode of transmission: Secondary | ICD-10-CM | POA: Diagnosis not present

## 2018-11-18 DIAGNOSIS — E8889 Other specified metabolic disorders: Secondary | ICD-10-CM

## 2018-11-18 DIAGNOSIS — R202 Paresthesia of skin: Secondary | ICD-10-CM

## 2018-11-18 LAB — POCT GLYCOSYLATED HEMOGLOBIN (HGB A1C): HbA1c, POC (controlled diabetic range): 6.4 % (ref 0.0–7.0)

## 2018-11-18 NOTE — Assessment & Plan Note (Signed)
D/C Effexor since she is not using it and it is not effective. She will contact her gynecologist for f/u.

## 2018-11-18 NOTE — Patient Instructions (Signed)
It was nice seeing you today. Your A1C looks good, continue current regimen.  Your symptoms is suggestive of paresthesia, which could be due to your diabetes. We will check some labs to make sure we are not missing anything.  Please contact health at work if you are having COVID-19 symptoms such as cough, loss of sense of taste and smell.  Follow-up with neurology as planned and discontinue your Effexor.

## 2018-11-18 NOTE — Progress Notes (Signed)
Subjective:     Patient ID: [name], female   DOB: 1960/09/04, 58 y.o.   MRN: [id number]  HPI Headache: She continues to have daily headache. She uses Tylenol and Ibuprofen as needed without major improvement. She already got evaluated by Neuro and MRI/MRA has been scheduled. She had a negative COVID-19 test done few weeks ago. She stated that she feels like she has lost her sense of smell and her taste feels weird. She denies cough or SOB today. Hot flashes: She was started on Effexor by her Gyn. She has not been taking this medication regularly. She will take it 1-2 times per week because the medication makes her feel bad. Paresthesia: C/O B/L heel burning pain which started 2 week ago. She will have it 2-3 times per week. Each episodes last 12-20 mins. No trigger and it resolves on it's own.  No numbness or loss of sensation.   Current Outpatient Medications on File Prior to Visit  Medication Sig Dispense Refill  . ezetimibe (ZETIA) 10 MG tablet Take 1 tablet (10 mg total) by mouth daily. 90 tablet 3  . gabapentin (NEURONTIN) 100 MG capsule Take 1 capsule (100 mg total) by mouth 3 (three) times daily. (Patient taking differently: Take 100 mg by mouth 2 (two) times daily. Hasn't started) 90 capsule 1  . metFORMIN (GLUCOPHAGE-XR) 500 MG 24 hr tablet TAKE 1 TABLET BY MOUTH DAILY WITH A MEAL. (Patient taking differently: Take 500 mg by mouth daily with breakfast. ) 180 tablet 3  . oxybutynin (DITROPAN-XL) 10 MG 24 hr tablet Take 1 tablet (10 mg total) by mouth at bedtime. 30 tablet 3  . Pitavastatin Calcium (LIVALO) 2 MG TABS Use 1x a day (Patient taking differently: Take 2 mg by mouth daily. Use 1x a day) 90 tablet 3  . topiramate (TOPAMAX) 50 MG tablet Take 1 tablet (50 mg total) by mouth 2 (two) times daily. 60 tablet 1  . Vitamin D, Ergocalciferol, (DRISDOL) 1.25 MG (50000 UT) CAPS capsule Take 1 capsule (50,000 Units total) by mouth every 7 (seven) days. 12 capsule 3  . ACCU-CHEK  FASTCLIX LANCETS MISC USE AS DIRECTED TO CHECK BLOOD SUGAR ONCE DAILY 102 each 5  . ACCU-CHEK GUIDE test strip USE AS DIRECTED TO CHECK BLOOD SUGAR ONCE DAILY 100 each 5  . Elastic Bandages & Supports (WRIST BRACE/LEFT MEDIUM) MISC 1 each by Does not apply route as needed. 1 each 0  . venlafaxine XR (EFFEXOR XR) 37.5 MG 24 hr capsule Take 1 capsule (37.5 mg total) by mouth daily. (Patient not taking: Reported on 11/18/2018) 30 capsule 1   No current facility-administered medications on file prior to visit.    Past Medical History:  Diagnosis Date  . Anxiety   . Arthritis   . Brain tumor (benign) ([place]) 2018  . Chest pain 05/15/2015  . Depression   . GERD (gastroesophageal reflux disease)   . H/O varicella   . History of measles, mumps, or rubella   . HSV-2 (herpes simplex virus 2) infection   . Hx of ovarian cyst   . Hyperlipidemia   . Hypersomnia 07/19/2015  . Migraine headache   . Plantar fasciitis   . Scapular dyskinesis 10/03/2016  . Sleep apnea    pt denies  . Sleep deprivation    loss of sleep  . VAIN I (vaginal intraepithelial neoplasia grade I) 2013   pap and confirmed by colposcopic biopsy   Vitals:   11/18/18 0934  BP: 128/80  Pulse:  72  SpO2: 99%      Review of Systems  Respiratory: Negative.   Cardiovascular: Negative.   Gastrointestinal: Negative.   Musculoskeletal: Negative.   Neurological: Positive for headaches. Negative for tremors and weakness.       Tingling  All other systems reviewed and are negative.      Objective:   Physical Exam Vitals signs and nursing note reviewed.  Constitutional:      Appearance: She is not ill-appearing.  Cardiovascular:     Rate and Rhythm: Normal rate and regular rhythm.     Heart sounds: Normal heart sounds. No murmur.  Pulmonary:     Effort: Pulmonary effort is normal. No respiratory distress.     Breath sounds: Normal breath sounds. No wheezing or rhonchi.  Abdominal:     General: Bowel sounds are normal.  There is no distension.     Palpations: Abdomen is soft. There is no mass.  Musculoskeletal:        General: No swelling.  Neurological:     General: No focal deficit present.     Mental Status: She is alert.     Cranial Nerves: Cranial nerves are intact.     Sensory: Sensation is intact.     Motor: Motor function is intact.     Coordination: Coordination is intact.     Gait: Gait is intact.     Deep Tendon Reflexes: Reflexes are normal and symmetric.     Comments: No sensory loss of her feet.        Assessment:     Headache Paresthesia Hot flashes    Plan:     Check problem list.

## 2018-11-18 NOTE — Assessment & Plan Note (Signed)
Daily headache. Inconsistent with her migraine symptoms. MRI/MRI scheduled. R/O infection cause. HIV, RPR check recommended. She had a recent negative COVID-19 test. She endorsed loss of taste and smell sensation. COVID-19 repeat test recommended. She does not want me to order test now. She will go to [facility] to go test. I stressed the importance of reporting her symptoms to health at work so that they can give appropriate recommended regarding testing and isolation. I discussed s/e of Effexor which includes headache and paresthesia. However, she stated she does not take this medication consistently. She can go ahead and d/c it all together. She also stated that she had not been taking her Gabapentin consistently. Hence, less likely the cause of her headache. F/U with neuro as planned.

## 2018-11-18 NOTE — Assessment & Plan Note (Addendum)
Likely diabetic neuropathy. Gabapentin recommended. Her glucose is well controlled. No neurologic deficit. Monitor closely. Consider EMG/NSC if it worsens. HIV/RPR,Vitamin B12, TSH and Vitamin D checked today.

## 2018-11-18 NOTE — Assessment & Plan Note (Signed)
A1C improved. Continue current dose of Metformin.

## 2018-11-19 LAB — VITAMIN B12: Vitamin B-12: 584 pg/mL (ref 232–1245)

## 2018-11-19 LAB — TSH: TSH: 0.938 u[IU]/mL (ref 0.450–4.500)

## 2018-11-19 LAB — VITAMIN D 25 HYDROXY (VIT D DEFICIENCY, FRACTURES): Vit D, 25-Hydroxy: 32.8 ng/mL (ref 30.0–100.0)

## 2018-11-19 LAB — RPR: RPR Ser Ql: NONREACTIVE

## 2018-11-19 LAB — HIV ANTIBODY (ROUTINE TESTING W REFLEX): HIV Screen 4th Generation wRfx: NONREACTIVE

## 2018-11-20 ENCOUNTER — Ambulatory Visit (INDEPENDENT_AMBULATORY_CARE_PROVIDER_SITE_OTHER): Payer: No Typology Code available for payment source | Admitting: Neurology

## 2018-11-20 ENCOUNTER — Other Ambulatory Visit: Payer: Self-pay

## 2018-11-20 DIAGNOSIS — M5010 Cervical disc disorder with radiculopathy, unspecified cervical region: Secondary | ICD-10-CM

## 2018-11-20 DIAGNOSIS — G5601 Carpal tunnel syndrome, right upper limb: Secondary | ICD-10-CM

## 2018-11-20 NOTE — Procedures (Signed)
[facility]  [address]  [place], [place] [address] Tel: [phone] Fax:  [phone] Test Date:  11/20/2018  Patient: [name] DOB: 1960/10/25 Physician: Narda Amber, DO  Sex: Female Height: 5\' 4"  Ref Phys: Metta Clines, DO  ID#: [id number] Temp: 33.0C Technician:    Patient Complaints: This is a 58 year old female referred for evaluation of chronic right arm pain and paresthesias.  NCV & EMG Findings: Extensive electrodiagnostic testing of the right upper extremity shows:  1. Right median and ulnar sensory responses are within normal limits.  Right mixed palmar sensory responses show mildly prolonged latency. 2. Right median and ulnar motor responses are within normal limits. 3. There is no evidence of active or chronic motor axonal loss changes affecting any of the tested muscles.  Motor unit configuration and recruitment pattern is within normal limits.  Impression: 1. Right median neuropathy at or distal to the wrist (very mild), consistent with a clinical diagnosis of carpal tunnel syndrome.   2. There is no evidence of a right cervical radiculopathy.   ___________________________ Narda Amber, DO    Nerve Conduction Studies Anti Sensory Summary Table   Site NR Peak (ms) Norm Peak (ms) P-T Amp (V) Norm P-T Amp  Right Median Anti Sensory (2nd Digit) 33 C  Wrist    3.2 <3.6 36.3 >15  Right Ulnar Anti Sensory (5th Digit)  Wrist    2.5 <3.1 36.6 >10   Motor Summary Table   Site NR Onset (ms) Norm Onset (ms) O-P Amp (mV) Norm O-P Amp Site1 Site2 Delta-0 (ms) Dist (cm) Vel (m/s) Norm Vel (m/s)  Right Median Motor (Abd Poll Brev) 33 C  Wrist    3.4 <4.0 8.1 >6 Elbow Wrist 4.3 27.0 63 >50  Elbow    7.7  7.8         Right Ulnar Motor (Abd Dig Minimi)  Wrist    2.0 <3.1 9.8 >7 B Elbow Wrist 3.4 23.0 68 >50  B Elbow    5.4  9.1  A Elbow B Elbow 1.6 10.0 63 >50  A Elbow    7.0  8.6          Comparison Summary Table   Site NR Peak (ms)  Norm Peak (ms) P-T Amp (V) Site1 Site2 Delta-P (ms) Norm Delta (ms)  Right Median/Ulnar Palm Comparison (Wrist - 8cm)  Median Palm    2.1 <2.2 41.5 Median Palm Ulnar Palm 0.5   Ulnar Palm    1.6 <2.2 14.2       EMG   Side Muscle Ins Act Fibs Psw Fasc Number Recrt Dur Dur. Amp Amp. Poly Poly. Comment  Right 1stDorInt Nml Nml Nml Nml Nml Nml Nml Nml Nml Nml Nml Nml N/A  Right Abd Poll Brev Nml Nml Nml Nml Nml Nml Nml Nml Nml Nml Nml Nml N/A  Right PronatorTeres Nml Nml Nml Nml Nml Nml Nml Nml Nml Nml Nml Nml N/A  Right Biceps Nml Nml Nml Nml Nml Nml Nml Nml Nml Nml Nml Nml N/A  Right Triceps Nml Nml Nml Nml Nml Nml Nml Nml Nml Nml Nml Nml N/A  Right Deltoid Nml Nml Nml Nml Nml Nml Nml Nml Nml Nml Nml Nml N/A      Waveforms:

## 2018-11-21 ENCOUNTER — Other Ambulatory Visit: Payer: Self-pay

## 2018-11-21 DIAGNOSIS — Z20822 Contact with and (suspected) exposure to covid-19: Secondary | ICD-10-CM

## 2018-11-23 LAB — NOVEL CORONAVIRUS, NAA: SARS-CoV-2, NAA: NOT DETECTED

## 2018-11-25 ENCOUNTER — Encounter: Payer: No Typology Code available for payment source | Admitting: Neurology

## 2018-11-28 ENCOUNTER — Ambulatory Visit (INDEPENDENT_AMBULATORY_CARE_PROVIDER_SITE_OTHER): Payer: No Typology Code available for payment source | Admitting: Family Medicine

## 2018-11-28 ENCOUNTER — Other Ambulatory Visit: Payer: Self-pay

## 2018-11-28 ENCOUNTER — Encounter: Payer: Self-pay | Admitting: Family Medicine

## 2018-11-28 VITALS — BP 122/78 | HR 87

## 2018-11-28 DIAGNOSIS — M7021 Olecranon bursitis, right elbow: Secondary | ICD-10-CM | POA: Diagnosis not present

## 2018-11-28 DIAGNOSIS — G8929 Other chronic pain: Secondary | ICD-10-CM

## 2018-11-28 DIAGNOSIS — M25511 Pain in right shoulder: Secondary | ICD-10-CM

## 2018-11-28 NOTE — Patient Instructions (Signed)
It was a pleasure to see you today! Thank you for choosing [facility] for your primary care. [name] was seen for shoulder and elbow pain. Come back to the clinic if things get worse.  Today we talked about your chronic shoulder and elbow pain.  We went over the results of your nerve test which did not seem to show cervical radiculopathy but do show a carpal tunnel which does not seem to match your main physical complaint.  After our exam it looks to me like you might have some bursitis in your elbow, the main treatment for that at this time would be to use some over-the-counter ibuprofen, rest from repetitive motions, and some ice.  For your problem in your shoulder given the fact that he had a prior history of rotator cuff injury and have already established with [facility] I like to suggest that you go see them again.   Please bring all your medications to every doctors visit   Sign up for My Chart to have easy access to your labs results, and communication with your Primary care physician.     Please check-out at the front desk before leaving the clinic.     Best,  Dr. Sherene Sires FAMILY MEDICINE RESIDENT - PGY3 11/28/2018 2:24 PM

## 2018-11-28 NOTE — Assessment & Plan Note (Signed)
Patient with prior diagnosis of rotator cuff injury on the side the pain which she said had resolved from a prior episode.  He was evaluated by [facility] at this time he considers them her shoulder provider.  Discussed that she does not have any emergent symptoms or distal clinical nerve deficits that it be appropriate for her to just reestablish with [facility] to see if they think there is any procedural intervention required.

## 2018-11-28 NOTE — Progress Notes (Signed)
    Subjective:  [name] is a 58 y.o. female who presents to the [facility] today with a chief complaint of right elbow pain.   HPI: Right shoulder pain Patient with prior diagnosis of rotator cuff injury on the side the pain which she said had resolved from a prior episode.  sHe was evaluated by [facility] at this time he considers them her shoulder provider.  No acute changes, this is chronic  Olecranon bursitis of right elbow Patient with pain most significant with flexion of the right elbow.  Does have some point tenderness with some fluctuant swelling immediate we have elected on, no specific nerve radiculopathy distal to that.  No erythema or infectious symptoms.   Objective:  Physical Exam: BP 122/78   Pulse 87   LMP  (LMP Unknown)   SpO2 99%   Gen: NAD, conversing comfortably CV: RRR with no murmurs appreciated Pulm: NWOB, CTAB with no crackles, wheezes, or rhonchi GI: Normal bowel sounds present. Soft, Nontender, Nondistended. MSK: *Point tenderness and some non-erythematous fluctuance at olecranon.  Pain worse with full flexion..  *Shoulder with no point tenderness but pain at multiple positionings, no erythema, warmth or other signs of infectious symptoms. Skin: warm, dry Neuro: grossly normal, moves all extremities Psych: Normal affect and thought content  No results found for this or any previous visit (from the past 72 hour(s)).   Assessment/Plan:  Right shoulder pain Patient with prior diagnosis of rotator cuff injury on the side the pain which she said had resolved from a prior episode.  He was evaluated by [facility] at this time he considers them her shoulder provider.  Discussed that she does not have any emergent symptoms or distal clinical nerve deficits that it be appropriate for her to just reestablish with [facility] to see if they think there is any procedural intervention required.  Olecranon bursitis of right elbow Patient with pain most  significant with flexion of the right elbow.  Does have some point tenderness with some fluctuant swelling immediate we have elected on, no specific nerve radiculopathy distal to that.  No erythema or infectious symptoms.  Discussed with the patient I think this is most likely olecranon bursitis, she describes repetitive motions at work.  Will suggest conservative treatment, temporarily ceasing the repetitive motion at work as well as ice and over-the-counter ibuprofen.   Sherene Sires, DO FAMILY MEDICINE RESIDENT - PGY3 12/02/2018 8:28 AM

## 2018-11-28 NOTE — Assessment & Plan Note (Signed)
Patient with pain most significant with flexion of the right elbow.  Does have some point tenderness with some fluctuant swelling immediate we have elected on, no specific nerve radiculopathy distal to that.  No erythema or infectious symptoms.  Discussed with the patient I think this is most likely olecranon bursitis, she describes repetitive motions at work.  Will suggest conservative treatment, temporarily ceasing the repetitive motion at work as well as ice and over-the-counter ibuprofen.

## 2018-12-01 ENCOUNTER — Telehealth: Payer: Self-pay

## 2018-12-01 DIAGNOSIS — M5010 Cervical disc disorder with radiculopathy, unspecified cervical region: Secondary | ICD-10-CM

## 2018-12-01 NOTE — Telephone Encounter (Signed)
Please put in referral to Ortho for her arm. Ottis Stain, CMA

## 2018-12-03 ENCOUNTER — Encounter: Payer: Self-pay | Admitting: Neurology

## 2018-12-05 ENCOUNTER — Ambulatory Visit
Admission: RE | Admit: 2018-12-05 | Discharge: 2018-12-05 | Disposition: A | Discharge status: Home / Self Care | Payer: No Typology Code available for payment source | Source: Ambulatory Visit | Attending: Neurology | Admitting: Neurology

## 2018-12-05 ENCOUNTER — Other Ambulatory Visit: Payer: Self-pay

## 2018-12-05 DIAGNOSIS — R519 Headache, unspecified: Secondary | ICD-10-CM

## 2018-12-05 MED ORDER — GADOBENATE DIMEGLUMINE 529 MG/ML IV SOLN
18.0000 mL | Freq: Once | INTRAVENOUS | Status: AC | PRN
  Administered 2018-12-05: 18 mL via INTRAVENOUS

## 2018-12-07 NOTE — Progress Notes (Signed)
NEUROLOGY FOLLOW UP OFFICE NOTE  SION BEEKER [id number]  HISTORY OF PRESENT ILLNESS: [name] is a 58 year old female who follows up for headache and cervicalgia with radiculopathy.  UPDATE: She underwent workup for new onset headache and right sided shoulder pain. NCV-EMG of right upper extremity from 11/20/18 was normal.   She is reportedly having an MRI on the shoulder coming up.  She now endorses left sided shoulder pain.  MRI of brain with and without contrast and MRA of head from 12/05/18 were personally reviewed and were normal. Sed rate was recommended but not performed.  Headaches have eased off. She has a dull aching headache daily, lasting a couple of hours.     Current NSAIDS:  none Current analgesics:  none Current triptans:  none Current ergotamine:  nnone Current anti-emetic:  none Current muscle relaxants:  none Current anti-anxiolytic:  none Current sleep aide:  none Current Antihypertensive medications:  none Current Antidepressant medications:  Venlafaxine XR 37.5mg  daily Current Anticonvulsant medications:  topiramate 50mg  twice daily, gabapentin 100mg  twice daily (for burning in the feet, makes her feel funny) Current anti-CGRP:  none Current Vitamins/Herbal/Supplements:  Magnesium oxide Current Antihistamines/Decongestants:  none Other therapy:  none  Depression/anxiety:  Her brother unexpectedly passed away last week. Pain: Chronic neck and shoulder pain.  HISTORY: She has history of migraines since her early 67s.  They typically are top/frontal headaches associated with nausea, photophobia and phonophobia, lasting 3 to 5 days and occurring every 3 to 6 months.  In July 2020, she began not feeling well.  She started having a moderate constant pressure and throbbing headache on the right side of her head as well as intermittent sharp stabbing pains in the right side of her head, radiating to the right ear.  The following month, she notes  some left sided pain as well.  Some associated mild nausea.  She has no appetite.  She notes cloudy vision but no double vision.  No dizziness, slurred speech, fever.    She was evaluated in the ED on 10/16/18.  CT head personally reviewed and was negative.  She was treated with headache cocktail (magnesium sulfate 1gm, Decadron 10mg , Toradol 15mg , Benadryl 12.5mg , Compazine 10mg ).  Headache resolved but soon recurred.    She also has history of chronic neck pain with radiating pain into the right shoulder.  MRI of cervical spine from 10/12/17 showed asymmetric right facet degeneration at C3-4 (with foraminal impingement), C4-5 (with foraminal impingement) and C7-T1 as well as some disc degeneration at C5-6 and C6-7.  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   Past NSAIDS:  Indomethacin, naproxen, ibuprofen Past steroids:  prednisone Past analgesics:  Tylenol, Tylenol #3, Fioricet, Midrin Past abortive triptans:  Axert, Imitrex Past abortive ergotamine:  none Past muscle relaxants:  Flexeril Past anti-emetic:  none Past antihypertensive medications:  none Past antidepressant medications:  amitriptyline Past anticonvulsant medications:  zonisamide 25mg , lamotrigine Past anti-CGRP:  none Past vitamins/Herbal/Supplements:  none Past antihistamines/decongestants:  none Other past therapies:  none  PAST MEDICAL HISTORY: Past Medical History:  Diagnosis Date  . Anxiety   . Arthritis   . Brain tumor (benign) ([place]) 2018  . Chest pain 05/15/2015  . Depression   . GERD (gastroesophageal reflux disease)   . H/O varicella   . History of measles, mumps,  or rubella   . HSV-2 (herpes simplex virus 2) infection   . Hx of ovarian cyst   . Hyperlipidemia   . Hypersomnia 07/19/2015  . Migraine headache   . Plantar fasciitis   . Scapular  dyskinesis 10/03/2016  . Sleep apnea    pt denies  . Sleep deprivation    loss of sleep  . VAIN I (vaginal intraepithelial neoplasia grade I) 2013   pap and confirmed by colposcopic biopsy    MEDICATIONS: Current Outpatient Medications on File Prior to Visit  Medication Sig Dispense Refill  . ACCU-CHEK FASTCLIX LANCETS MISC USE AS DIRECTED TO CHECK BLOOD SUGAR ONCE DAILY 102 each 5  . ACCU-CHEK GUIDE test strip USE AS DIRECTED TO CHECK BLOOD SUGAR ONCE DAILY 100 each 5  . Elastic Bandages & Supports (WRIST BRACE/LEFT MEDIUM) MISC 1 each by Does not apply route as needed. 1 each 0  . ezetimibe (ZETIA) 10 MG tablet Take 1 tablet (10 mg total) by mouth daily. 90 tablet 3  . gabapentin (NEURONTIN) 100 MG capsule Take 1 capsule (100 mg total) by mouth 3 (three) times daily. (Patient taking differently: Take 100 mg by mouth 2 (two) times daily. Hasn't started) 90 capsule 1  . metFORMIN (GLUCOPHAGE-XR) 500 MG 24 hr tablet TAKE 1 TABLET BY MOUTH DAILY WITH A MEAL. (Patient taking differently: Take 500 mg by mouth daily with breakfast. ) 180 tablet 3  . oxybutynin (DITROPAN-XL) 10 MG 24 hr tablet Take 1 tablet (10 mg total) by mouth at bedtime. 30 tablet 3  . Pitavastatin Calcium (LIVALO) 2 MG TABS Use 1x a day (Patient taking differently: Take 2 mg by mouth daily. Use 1x a day) 90 tablet 3  . topiramate (TOPAMAX) 50 MG tablet Take 1 tablet (50 mg total) by mouth 2 (two) times daily. 60 tablet 1  . venlafaxine XR (EFFEXOR XR) 37.5 MG 24 hr capsule Take 1 capsule (37.5 mg total) by mouth daily. 30 capsule 1  . Vitamin D, Ergocalciferol, (DRISDOL) 1.25 MG (50000 UT) CAPS capsule Take 1 capsule (50,000 Units total) by mouth every 7 (seven) days. 12 capsule 3   No current facility-administered medications on file prior to visit.     ALLERGIES: Allergies  Allergen Reactions  . Sulfa Antibiotics Anaphylaxis, Hives and Swelling    FAMILY HISTORY: Family History  Problem Relation Age of Onset  .  Hypertension Mother   . Hypotension Mother   . Anemia Mother        low iron  . Heart disease Sister   . Sickle cell trait Other   . Alcohol abuse Brother   . Alcohol abuse Brother   . Drug abuse Brother   . Colon cancer Neg Hx   . Esophageal cancer Neg Hx   . Stomach cancer Neg Hx   . Breast cancer Neg Hx    SOCIAL HISTORY: Social History   Socioeconomic History  . Marital status: Married    Spouse name: [name]  . Number of children: 2  . Years of education: 31  . Highest education level: Associate degree: academic program  Occupational History    Employer: [employer]  . Financial resource strain: Not on file  . Food insecurity    Worry: Not on file    Inability: Not on file  . Transportation needs    Medical: Not on file    Non-medical: Not on file  Tobacco Use  . Smoking status: Former Smoker    Packs/day:  0.50    Types: Cigarettes  . Smokeless tobacco: Never Used  Substance and Sexual Activity  . Alcohol use: No    Alcohol/week: 0.0 standard drinks  . Drug use: No  . Sexual activity: Yes    Partners: Male    Birth control/protection: Surgical    Comment: Hysterectomy  Lifestyle  . Physical activity    Days per week: Not on file    Minutes per session: Not on file  . Stress: Not on file  Relationships  . Social Herbalist on phone: Not on file    Gets together: Not on file    Attends religious service: Not on file    Active member of club or organization: Not on file    Attends meetings of clubs or organizations: Not on file    Relationship status: Not on file  . Intimate partner violence    Fear of current or ex partner: Not on file    Emotionally abused: Not on file    Physically abused: Not on file    Forced sexual activity: Not on file  Other Topics Concern  . Not on file  Social History Narrative   Patient lives at home with her husband [name]). 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 Two children. Both boys      Patient is right-handed. She lives in a one level home. She drinks coffee occasionally. She does not regularly exercise.    REVIEW OF SYSTEMS: Constitutional: No fevers, chills, or sweats, no generalized fatigue, change in appetite Eyes: No visual changes, double vision, eye pain Ear, nose and throat: No hearing loss, ear pain, nasal congestion, sore throat Cardiovascular: No chest pain, palpitations Respiratory:  No shortness of breath at rest or with exertion, wheezes GastrointestinaI: No nausea, vomiting, diarrhea, abdominal pain, fecal incontinence Genitourinary:  No dysuria, urinary retention or frequency Musculoskeletal:  Neck pain, shoulder pain Integumentary: No rash, pruritus, skin lesions Neurological: as above Psychiatric: No depression, insomnia, anxiety Endocrine: No palpitations, fatigue, diaphoresis, mood swings, change in appetite, change in weight, increased thirst Hematologic/Lymphatic:  No purpura, petechiae. Allergic/Immunologic: no itchy/runny eyes, nasal congestion, recent allergic reactions, rashes  PHYSICAL EXAM: Blood pressure 118/60, pulse 78, height 5\' 5"  (1.651 m), weight 196 lb (88.9 kg), SpO2 95 %. General: No acute distress.  Patient appears well-groomed.   Head:  Normocephalic/atraumatic Eyes:  Fundi examined but not visualized Neck: supple, bilateral paraspinal tenderness, full range of motion Heart:  Regular rate and rhythm Lungs:  Clear to auscultation bilaterally Back: No paraspinal tenderness Neurological Exam: alert and oriented to person, place, and time. Attention span and concentration intact, recent and remote memory intact, fund of knowledge intact.  Speech fluent and not dysarthric, language intact.  CN II-XII intact. Bulk and tone normal, muscle strength 5/5 throughout.  Sensation to light touch, temperature and vibration intact.  Deep tendon reflexes 2+  throughout, toes downgoing.  Finger to nose and heel to shin testing intact.  Gait normal, Romberg negative.  IMPRESSION: 1.  New onset headache, not similar to previous migraines.  Probably cervicogenic.  Also aggravated by shoulder pain 2.  Cervical disc disease  PLAN: 1.  Will increase gabapentin to 200mg  twice daily.  Instructed to contact me with update at end of prescription and we can increase to 300mg  twice daily if needed. 2.  Topiramate 50mg  twice daily refilled 3.  Limit use of  pain relievers to no more than 2 days out of week to prevent risk of rebound or medication-overuse headache. 4.  Will check sed rate. 5.  Follow up in 4 months.  Metta Clines, DO  CC: Andrena Mews, MD

## 2018-12-09 ENCOUNTER — Ambulatory Visit (INDEPENDENT_AMBULATORY_CARE_PROVIDER_SITE_OTHER): Payer: No Typology Code available for payment source | Admitting: Neurology

## 2018-12-09 ENCOUNTER — Telehealth: Payer: Self-pay | Admitting: *Deleted

## 2018-12-09 ENCOUNTER — Other Ambulatory Visit: Payer: Self-pay

## 2018-12-09 ENCOUNTER — Encounter: Payer: Self-pay | Admitting: Neurology

## 2018-12-09 ENCOUNTER — Other Ambulatory Visit (INDEPENDENT_AMBULATORY_CARE_PROVIDER_SITE_OTHER): Payer: No Typology Code available for payment source

## 2018-12-09 VITALS — BP 118/60 | HR 78 | Ht 65.0 in | Wt 196.0 lb

## 2018-12-09 DIAGNOSIS — G4486 Cervicogenic headache: Secondary | ICD-10-CM

## 2018-12-09 DIAGNOSIS — M5010 Cervical disc disorder with radiculopathy, unspecified cervical region: Secondary | ICD-10-CM | POA: Diagnosis not present

## 2018-12-09 DIAGNOSIS — R519 Headache, unspecified: Secondary | ICD-10-CM

## 2018-12-09 DIAGNOSIS — R51 Headache: Secondary | ICD-10-CM | POA: Diagnosis not present

## 2018-12-09 MED ORDER — GABAPENTIN 100 MG PO CAPS: ORAL_CAPSULE | ORAL | 0 refills | Status: DC

## 2018-12-09 MED ORDER — TOPIRAMATE 50 MG PO TABS: 50.0000 mg | ORAL_TABLET | Freq: Two times a day (BID) | ORAL | 5 refills | Status: DC

## 2018-12-09 MED FILL — GABAPENTIN 100 MG CAPSULE: 100 | 30 days supply | Qty: 120 | Fill #0

## 2018-12-09 MED FILL — TOPIRAMATE 50 MG TABLET: 50 | 30 days supply | Qty: 60 | Fill #0

## 2018-12-09 NOTE — Telephone Encounter (Signed)
Called patient and informed her that Dr. Tomi Likens will NOT be able to fill out those forms (per MD due to the limited number of times he has seen her and what he has seen her for).  Patient verbalized understanding and will have PCP complete.

## 2018-12-09 NOTE — Telephone Encounter (Signed)
Received FMLA forms for patient. She saw Dr. Tomi Likens first time on 8/5 as a new referral and again today.  Called patient to let her know about the $25 fee to have forms filled out - she verbalized understanding. She said that [employer] sent the forms to her primary care MD (who has completed these in the past for the patient - pt stated) AND to our office.  Informed patient if PCP has completed in the past and they have a prior copy on file it may be best for them to do it. Both offices should not as that would cause confusion for the [employer] absence management company.  Patient verbalized understanding and is going to call PCP to have done. She will only call us back IF they are not able to do and she would like Dr Gustavo Lah office to complete.  Dr. Tomi Likens - FYI above.

## 2018-12-09 NOTE — Patient Instructions (Signed)
1.  Will refill topiramate 50mg  twice daily 2.  Will increase gabapentin to 200mg  twice daily.  See instructions on bottle.  At end of prescription, contact me for update and refill. 3.  Will check sed rate 4.  Follow up with ortho regarding shoulder and possibly with Dr. Nelva Bush for injections 5.  Follow up in 4 months.

## 2018-12-10 ENCOUNTER — Telehealth: Payer: Self-pay | Admitting: *Deleted

## 2018-12-10 LAB — SEDIMENTATION RATE: Sed Rate: 28 mm/h (ref 0–30)

## 2018-12-10 NOTE — Telephone Encounter (Signed)
Called and informed patient her sed rate was normal  - no further questions.

## 2018-12-10 NOTE — Telephone Encounter (Signed)
-----   Message from Pieter Partridge, DO sent at 12/10/2018  7:28 AM EDT ----- Sed rate is normal.

## 2018-12-23 DIAGNOSIS — M75110 Incomplete rotator cuff tear or rupture of unspecified shoulder, not specified as traumatic: Secondary | ICD-10-CM | POA: Insufficient documentation

## 2018-12-23 DIAGNOSIS — M7711 Lateral epicondylitis, right elbow: Secondary | ICD-10-CM | POA: Insufficient documentation

## 2018-12-24 NOTE — Progress Notes (Signed)
58 y.o. [id number] Married [place] American female here for annual exam. Patient would like to discuss Venlafaxine.    Having migraine for 3 months.  Seeing neurology, spine and neck surgery.   Has a right rotator cuff problem also.  Doing PT.  Brother died from a cardiac aneurysm 2 weeks ago.   Mother has dementia and did not recognize her at the last visit.   Stressed from everything going on.  No sleeping.  Eating ok.  When she is awake, she has pain.  Not sure how she feels.   Having a lot of hot flashes.  Effexor did not help.   Not taking Ditropan XL.  States she does not need it.  If she needs it, she will refill it.   PCP: [name], MD    No LMP recorded (lmp unknown). Patient has had a hysterectomy.           Sexually active: Yes.    The current method of family planning is status post hysterectomy.    Exercising: Yes.    walking Smoker:  no  Health Maintenance: Pap:  11/12/16 Normal History of abnormal Pap:  Yes, years ago MMG:  12/13/17 BIRADS 1 negative/density c Colonoscopy:  2014 [facility], follow up in 10 years BMD:   n/a  Result  n/a TDaP:  2011 Gardasil:   no HIV: negative in the past Hep C: negative in the past Screening Labs: PCP Flu vaccine:  Done November 18, 2018.    reports that she has quit smoking. Her smoking use included cigarettes. She smoked 0.50 packs per day. She has never used smokeless tobacco. She reports that she does not drink alcohol or use drugs.  Past Medical History:  Diagnosis Date  . Anxiety   . Arthritis   . Brain tumor (benign) ([place]) 2018  . Chest pain 05/15/2015  . Depression   . GERD (gastroesophageal reflux disease)   . H/O varicella   . History of measles, mumps, or rubella   . HSV-2 (herpes simplex virus 2) infection   . Hx of ovarian cyst   . Hyperlipidemia   . Hypersomnia 07/19/2015  . Migraine headache   . Plantar fasciitis   . Scapular dyskinesis 10/03/2016  . Sleep apnea    pt denies  . Sleep  deprivation    loss of sleep  . VAIN I (vaginal intraepithelial neoplasia grade I) 2013   pap and confirmed by colposcopic biopsy    Past Surgical History:  Procedure Laterality Date  . ABDOMINAL HYSTERECTOMY    . CHOLECYSTECTOMY  11/13/10  . COLONOSCOPY  Multiple  . DILATION AND CURETTAGE OF UTERUS    . ESOPHAGOGASTRODUODENOSCOPY    . OOPHORECTOMY  2009   bilateral salpingo-oophorectomy.  . TUBAL LIGATION      Current Outpatient Medications  Medication Sig Dispense Refill  . ACCU-CHEK FASTCLIX LANCETS MISC USE AS DIRECTED TO CHECK BLOOD SUGAR ONCE DAILY 102 each 5  . ACCU-CHEK GUIDE test strip USE AS DIRECTED TO CHECK BLOOD SUGAR ONCE DAILY 100 each 5  . Elastic Bandages & Supports (WRIST BRACE/LEFT MEDIUM) MISC 1 each by Does not apply route as needed. 1 each 0  . ezetimibe (ZETIA) 10 MG tablet Take 1 tablet (10 mg total) by mouth daily. 90 tablet 3  . gabapentin (NEURONTIN) 100 MG capsule Take 1 capsule in AM and 2 capsules at bedtime for one week, then 2 capsules twice daily 120 capsule 0  . ibuprofen (ADVIL) 800 MG tablet Take  800 mg by mouth 3 (three) times daily.     Marland Kitchen MAGNESIUM CITRATE PO Take by mouth.    . metFORMIN (GLUCOPHAGE-XR) 500 MG 24 hr tablet TAKE 1 TABLET BY MOUTH DAILY WITH A MEAL. (Patient taking differently: Take 500 mg by mouth daily with breakfast. ) 180 tablet 3  . Pitavastatin Calcium (LIVALO) 2 MG TABS Use 1x a day (Patient taking differently: Take 2 mg by mouth daily. Use 1x a day) 90 tablet 3  . topiramate (TOPAMAX) 50 MG tablet Take 1 tablet (50 mg total) by mouth 2 (two) times daily. 60 tablet 5  . Vitamin D, Ergocalciferol, (DRISDOL) 1.25 MG (50000 UT) CAPS capsule Take 1 capsule (50,000 Units total) by mouth every 7 (seven) days. 12 capsule 3  . oxybutynin (DITROPAN-XL) 10 MG 24 hr tablet Take 1 tablet (10 mg total) by mouth at bedtime. (Patient not taking: Reported on 12/26/2018) 30 tablet 3   No current facility-administered medications for this  visit.     Family History  Problem Relation Age of Onset  . Hypertension Mother   . Hypotension Mother   . Anemia Mother        low iron  . Heart disease Sister   . Sickle cell trait Other   . Alcohol abuse Brother   . Alcohol abuse Brother   . Aneurysm Brother   . Drug abuse Brother   . Colon cancer Neg Hx   . Esophageal cancer Neg Hx   . Stomach cancer Neg Hx   . Breast cancer Neg Hx     Review of Systems  Constitutional: Negative.   HENT: Negative.   Eyes: Negative.   Respiratory: Negative.   Cardiovascular: Negative.   Gastrointestinal: Negative.   Endocrine: Negative.   Genitourinary: Negative.   Musculoskeletal: Negative.   Skin: Negative.   Allergic/Immunologic: Negative.   Neurological: Negative.   Hematological: Negative.   Psychiatric/Behavioral: Negative.     Exam:   BP 130/80 (BP Location: Left Arm, Patient Position: Sitting, Cuff Size: Normal)   Pulse 84   Resp 12   Ht 5\' 5"  (1.651 m)   Wt 196 lb (88.9 kg)   LMP  (LMP Unknown)   BMI 32.62 kg/m     General appearance: alert, cooperative and appears stated age Head: normocephalic, without obvious abnormality, atraumatic Neck: no adenopathy, supple, symmetrical, trachea midline and thyroid normal to inspection and palpation Lungs: clear to auscultation bilaterally Breasts: normal appearance, no masses or tenderness, No nipple retraction or dimpling, No nipple discharge or bleeding, No axillary adenopathy Heart: regular rate and rhythm Abdomen: soft, non-tender; no masses, no organomegaly Extremities: extremities normal, atraumatic, no cyanosis or edema Skin: skin color, texture, turgor normal. No rashes or lesions Lymph nodes: cervical, supraclavicular, and axillary nodes normal. Neurologic: grossly normal  Pelvic: External genitalia:  Sebaceous cyst of the left medial thigh/vulvar border              No abnormal inguinal nodes palpated.              Urethra:  normal appearing urethra with no  masses, tenderness or lesions              Bartholins and Skenes: normal                 Vagina: normal appearing vagina with normal color and discharge, no lesions              Cervix:  absent  Pap taken: No. Bimanual Exam:  Uterus:  absent              Adnexa: no mass, fullness, tenderness              Rectal exam:  declined  Chaperone was present for exam.  Assessment:   Well woman visit with normal exam. Status post hysterectomy.  Status post BSO.  Menopausal symptoms.  Already on Neurontin. Hx VAIN I. Hx HSV 2. Overactive bladder.  Situational stress.  Change in health.  Plan: Mammogram screening discussed.  She will schedule. Self breast awareness reviewed. Pap and HR HPV as above. Guidelines for Calcium, Vitamin D, regular exercise program including cardiovascular and weight bearing exercise. We discussed Gabapentin as a way to treat her pain and hot flashes.  She declines Cymbalta.  We discussed time off from work and doing continuous FMLA.   I encouraged her to do counseling and bereavement support through Hospice.  Follow up annually and prn.   After visit summary provided.

## 2018-12-25 MED FILL — IBUPROFEN 800 MG TABS: 800 | 30 days supply | Qty: 90 | Fill #0

## 2018-12-26 ENCOUNTER — Other Ambulatory Visit: Payer: Self-pay

## 2018-12-26 ENCOUNTER — Encounter: Payer: Self-pay | Admitting: Obstetrics and Gynecology

## 2018-12-26 ENCOUNTER — Ambulatory Visit: Payer: No Typology Code available for payment source | Admitting: Obstetrics and Gynecology

## 2018-12-26 VITALS — BP 130/80 | HR 84 | Resp 12 | Ht 65.0 in | Wt 196.0 lb

## 2018-12-26 DIAGNOSIS — Z01419 Encounter for gynecological examination (general) (routine) without abnormal findings: Secondary | ICD-10-CM | POA: Diagnosis not present

## 2018-12-26 NOTE — Patient Instructions (Signed)
EXERCISE AND DIET:  We recommended that you start or continue a regular exercise program for good health. Regular exercise means any activity that makes your heart beat faster and makes you sweat.  We recommend exercising at least 30 minutes per day at least 3 days a week, preferably 4 or 5.  We also recommend a diet low in fat and sugar.  Inactivity, poor dietary choices and obesity can cause diabetes, heart attack, stroke, and kidney damage, among others.    ALCOHOL AND SMOKING:  Women should limit their alcohol intake to no more than 7 drinks/beers/glasses of wine (combined, not each!) per week. Moderation of alcohol intake to this level decreases your risk of breast cancer and liver damage. And of course, no recreational drugs are part of a healthy lifestyle.  And absolutely no smoking or even second hand smoke. Most people know smoking can cause heart and lung diseases, but did you know it also contributes to weakening of your bones? Aging of your skin?  Yellowing of your teeth and nails?  CALCIUM AND VITAMIN D:  Adequate intake of calcium and Vitamin D are recommended.  The recommendations for exact amounts of these supplements seem to change often, but generally speaking 600 mg of calcium (either carbonate or citrate) and 800 units of Vitamin D per day seems prudent. Certain women may benefit from higher intake of Vitamin D.  If you are among these women, your doctor will have told you during your visit.    PAP SMEARS:  Pap smears, to check for cervical cancer or precancers,  have traditionally been done yearly, although recent scientific advances have shown that most women can have pap smears less often.  However, every woman still should have a physical exam from her gynecologist every year. It will include a breast check, inspection of the vulva and vagina to check for abnormal growths or skin changes, a visual exam of the cervix, and then an exam to evaluate the size and shape of the uterus and  ovaries.  And after 57 years of age, a rectal exam is indicated to check for rectal cancers. We will also provide age appropriate advice regarding health maintenance, like when you should have certain vaccines, screening for sexually transmitted diseases, bone density testing, colonoscopy, mammograms, etc.   MAMMOGRAMS:  All women over 40 years old should have a yearly mammogram. Many facilities now offer a "3D" mammogram, which may cost around $50 extra out of pocket. If possible,  we recommend you accept the option to have the 3D mammogram performed.  It both reduces the number of women who will be called back for extra views which then turn out to be normal, and it is better than the routine mammogram at detecting truly abnormal areas.    COLONOSCOPY:  Colonoscopy to screen for colon cancer is recommended for all women at age 50.  We know, you hate the idea of the prep.  We agree, BUT, having colon cancer and not knowing it is worse!!  Colon cancer so often starts as a polyp that can be seen and removed at colonscopy, which can quite literally save your life!  And if your first colonoscopy is normal and you have no family history of colon cancer, most women don't have to have it again for 10 years.  Once every ten years, you can do something that may end up saving your life, right?  We will be happy to help you get it scheduled when you are ready.    Be sure to check your insurance coverage so you understand how much it will cost.  It may be covered as a preventative service at no cost, but you should check your particular policy.     Duloxetine delayed-release capsules What is this medicine? DULOXETINE (doo LOX e teen) is used to treat depression, anxiety, and different types of chronic pain. This medicine may be used for other purposes; ask your health care provider or pharmacist if you have questions. COMMON BRAND NAME(S): Cymbalta, Drizalma, Irenka What should I tell my health care provider before I  take this medicine? They need to know if you have any of these conditions:  bipolar disorder  glaucoma  high blood pressure  kidney disease  liver disease  seizures  suicidal thoughts, plans or attempt; a previous suicide attempt by you or a family member  take medicines that treat or prevent blood clots  taken medicines called MAOIs like Carbex, Eldepryl, Marplan, Nardil, and Parnate within 14 days  trouble passing urine  an unusual reaction to duloxetine, other medicines, foods, dyes, or preservatives  pregnant or trying to get pregnant  breast-feeding How should I use this medicine? Take this medicine by mouth with a glass of water. Follow the directions on the prescription label. Do not crush, cut or chew some capsules of this medicine. Some capsules may be opened and sprinkled on applesauce. Check with your doctor or pharmacist if you are not sure. You can take this medicine with or without food. Take your medicine at regular intervals. Do not take your medicine more often than directed. Do not stop taking this medicine suddenly except upon the advice of your doctor. Stopping this medicine too quickly may cause serious side effects or your condition may worsen. A special MedGuide will be given to you by the pharmacist with each prescription and refill. Be sure to read this information carefully each time. Talk to your pediatrician regarding the use of this medicine in children. While this drug may be prescribed for children as young as 7 years of age for selected conditions, precautions do apply. Overdosage: If you think you have taken too much of this medicine contact a poison control center or emergency room at once. NOTE: This medicine is only for you. Do not share this medicine with others. What if I miss a dose? If you miss a dose, take it as soon as you can. If it is almost time for your next dose, take only that dose. Do not take double or extra doses. What may  interact with this medicine? Do not take this medicine with any of the following medications:  desvenlafaxine  levomilnacipran  linezolid  MAOIs like Carbex, Eldepryl, Marplan, Nardil, and Parnate  methylene blue (injected into a vein)  milnacipran  thioridazine  venlafaxine This medicine may also interact with the following medications:  alcohol  amphetamines  aspirin and aspirin-like medicines  certain antibiotics like ciprofloxacin and enoxacin  certain medicines for blood pressure, heart disease, irregular heart beat  certain medicines for depression, anxiety, or psychotic disturbances  certain medicines for migraine headache like almotriptan, eletriptan, frovatriptan, naratriptan, rizatriptan, sumatriptan, zolmitriptan  certain medicines that treat or prevent blood clots like warfarin, enoxaparin, and dalteparin  cimetidine  fentanyl  lithium  NSAIDS, medicines for pain and inflammation, like ibuprofen or naproxen  phentermine  procarbazine  rasagiline  sibutramine  St. John's wort  theophylline  tramadol  tryptophan This list may not describe all possible interactions. Give your health care provider   a list of all the medicines, herbs, non-prescription drugs, or dietary supplements you use. Also tell them if you smoke, drink alcohol, or use illegal drugs. Some items may interact with your medicine. What should I watch for while using this medicine? Tell your doctor if your symptoms do not get better or if they get worse. Visit your doctor or healthcare provider for regular checks on your progress. Because it may take several weeks to see the full effects of this medicine, it is important to continue your treatment as prescribed by your doctor. This medicine may cause serious skin reactions. They can happen weeks to months after starting the medicine. Contact your healthcare provider right away if you notice fevers or flu-like symptoms with a rash.  The rash may be red or purple and then turn into blisters or peeling of the skin. Or, you might notice a red rash with swelling of the face, lips, or lymph nodes in your neck or under your arms. Patients and their families should watch out for new or worsening thoughts of suicide or depression. Also watch out for sudden changes in feelings such as feeling anxious, agitated, panicky, irritable, hostile, aggressive, impulsive, severely restless, overly excited and hyperactive, or not being able to sleep. If this happens, especially at the beginning of treatment or after a change in dose, call your healthcare provider. You may get drowsy or dizzy. Do not drive, use machinery, or do anything that needs mental alertness until you know how this medicine affects you. Do not stand or sit up quickly, especially if you are an older patient. This reduces the risk of dizzy or fainting spells. Alcohol may interfere with the effect of this medicine. Avoid alcoholic drinks. This medicine can cause an increase in blood pressure. This medicine can also cause a sudden drop in your blood pressure, which may make you feel faint and increase the chance of a fall. These effects are most common when you first start the medicine or when the dose is increased, or during use of other medicines that can cause a sudden drop in blood pressure. Check with your doctor for instructions on monitoring your blood pressure while taking this medicine. Your mouth may get dry. Chewing sugarless gum or sucking hard candy, and drinking plenty of water, may help. Contact your doctor if the problem does not go away or is severe. What side effects may I notice from receiving this medicine? Side effects that you should report to your doctor or health care professional as soon as possible:  allergic reactions like skin rash, itching or hives, swelling of the face, lips, or tongue  anxious  breathing problems  confusion  changes in  vision  chest pain  confusion  elevated mood, decreased need for sleep, racing thoughts, impulsive behavior  eye pain  fast, irregular heartbeat  feeling faint or lightheaded, falls  feeling agitated, angry, or irritable  hallucination, loss of contact with reality  high blood pressure  loss of balance or coordination  palpitations  redness, blistering, peeling or loosening of the skin, including inside the mouth  restlessness, pacing, inability to keep still  seizures  stiff muscles  suicidal thoughts or other mood changes  trouble passing urine or change in the amount of urine  trouble sleeping  unusual bleeding or bruising  unusually weak or tired  vomiting  yellowing of the eyes or skin Side effects that usually do not require medical attention (report to your doctor or health care professional if   they continue or are bothersome):  change in sex drive or performance  change in appetite or weight  constipation  dizziness  dry mouth  headache  increased sweating  nausea  tired This list may not describe all possible side effects. Call your doctor for medical advice about side effects. You may report side effects to FDA at 1-800-FDA-1088. Where should I keep my medicine? Keep out of the reach of children. Store at room temperature between 15 and 30 degrees C (59 to 86 degrees F). Throw away any unused medicine after the expiration date. NOTE: This sheet is a summary. It may not cover all possible information. If you have questions about this medicine, talk to your doctor, pharmacist, or health care provider.  2020 [employer] (2018-06-05 13:47:50)  

## 2018-12-30 DIAGNOSIS — E878 Other disorders of electrolyte and fluid balance, not elsewhere classified: Secondary | ICD-10-CM | POA: Insufficient documentation

## 2018-12-30 MED FILL — GABAPENTIN 300 MG CAPSULE: 300 | 30 days supply | Qty: 90 | Fill #0

## 2018-12-31 ENCOUNTER — Other Ambulatory Visit: Payer: Self-pay

## 2019-01-02 ENCOUNTER — Other Ambulatory Visit: Payer: Self-pay

## 2019-01-02 ENCOUNTER — Encounter: Payer: Self-pay | Admitting: Internal Medicine

## 2019-01-02 ENCOUNTER — Ambulatory Visit (INDEPENDENT_AMBULATORY_CARE_PROVIDER_SITE_OTHER): Payer: No Typology Code available for payment source | Admitting: Internal Medicine

## 2019-01-02 VITALS — BP 120/86 | HR 85 | Ht 65.0 in | Wt 201.0 lb

## 2019-01-02 DIAGNOSIS — E538 Deficiency of other specified B group vitamins: Secondary | ICD-10-CM

## 2019-01-02 DIAGNOSIS — E785 Hyperlipidemia, unspecified: Secondary | ICD-10-CM

## 2019-01-02 DIAGNOSIS — R7989 Other specified abnormal findings of blood chemistry: Secondary | ICD-10-CM

## 2019-01-02 DIAGNOSIS — E1169 Type 2 diabetes mellitus with other specified complication: Secondary | ICD-10-CM | POA: Diagnosis not present

## 2019-01-02 DIAGNOSIS — E559 Vitamin D deficiency, unspecified: Secondary | ICD-10-CM

## 2019-01-02 LAB — LIPID PANEL
Cholesterol: 224 mg/dL — ABNORMAL HIGH (ref 0–200)
HDL: 47.2 mg/dL (ref >39.00)
LDL Cholesterol: 158 mg/dL — ABNORMAL HIGH (ref 0–99)
NonHDL: 177.22
Total CHOL/HDL Ratio: 5
Triglycerides: 96 mg/dL (ref 0.0–149.0)
VLDL: 19.2 mg/dL (ref 0.0–40.0)

## 2019-01-02 MED ORDER — CYANOCOBALAMIN 1000 MCG/ML IJ SOLN
1000.0000 ug | Freq: Once | INTRAMUSCULAR | Status: AC
  Administered 2019-01-02: 1000 ug via INTRAMUSCULAR

## 2019-01-02 MED ORDER — ROSUVASTATIN CALCIUM 5 MG PO TABS: 5.0000 mg | ORAL_TABLET | Freq: Every day | ORAL | 3 refills | Status: DC

## 2019-01-02 NOTE — Progress Notes (Signed)
Patient ID: [name], female   DOB: [birth year], 58 y.o.   MRN: [id number]  HPI: [name] is a 58 y.o.-year-old female, presenting for f/u for DM2; prediabetes dx early 2000s, controlled, without long term complications, hyperlipidemia, subclinical thyrotoxicosis. Also, vitamin D deficiency and low vitamin B12.Last visit 6 months ago (virtual).  Since last visit, she saw neurology for migraines (related to neck pain, C5-C6 - will get a steroid inj soon), carpal tunnel, and peripheral neuropathy. She also has rotator cuff pbs.  She also had increased stress-her brother, who was in his 57s, died unexpectedly.  Last hemoglobin A1c was: Lab Results  Component Value Date   HGBA1C 6.4 11/18/2018   HGBA1C 6.2 (H) 05/12/2018   HGBA1C 6.2 (A) 12/30/2017  She had several steroid inj in the past- last in 04/2014.  She is on: - Metformin ER 500 mg 2x a day >> we had to decrease to once a day 2/2 GI discomfort >> 500 mg with dinner She previously tried: - Tradjenta 5 mg in am, before b'fast  - Invokana 100 mg in am - started 03/2015 >> she had CP, HA, lightheadedness - regular metformin >> diarrhea/nausea with regular metformin.  Pt checks sugars  2x a day: - am: 98-103, 118 >> 88-131 >> 88-95 >> 87-90s, 101 - 2h after b'fast:  n/c >> 90-125 >> 86-127 >> n/c - lunch: 95-100 >> 90s >> n/c >> 87-120 >> n/c - 2h after lunch:  95-100 >> n/c >> 86-118 >> n/c - before dinner:80-100 >> n/c >> 80-127 >> n/c - 2h after dinner:up to 135 >> 90-120 >> 98-103 >> 110-118 - bedtime:  85-100 >> n/c >> 97-121 >> n/c - nighttime: 89-116 Lowest sugar was  66 >> 80 >> 88 >> 87; it is unclear at which level she has hypoglycemia unawareness. Highest: 135 >> 131 >> 106 >> 118.  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  -No history of, last BUN/creatinine was higher, though:  Lab Results  Component Value Date   BUN 21 (H) 10/16/2018   CREATININE 1.03 (H) 10/16/2018   Lab Results  Component Value Date   MICRALBCREAT 30-300 10/07/2018   MICRALBCREAT 0.8 07/04/2017   - last eye exam was in 05/2018: No Sharon. [name]. Saw [name]: She has a history of a corneal ulcer - + numbness, tingling, burning in her feet - - increased Neurontin 300 mg tid - related to cervical spondylosis  HL:  She has a history of a very high LDL goal Lab Results  Component Value Date   CHOL 231 (H) 12/30/2017   CHOL 255 (H) 07/04/2017   CHOL 167 04/19/2016   Lab Results  Component Value Date   HDL 47.60 12/30/2017   HDL 42.20 07/04/2017   HDL 44.50 04/19/2016   Lab Results  Component Value Date   LDLCALC 164 (H) 12/30/2017   LDLCALC 196 (H) 07/04/2017   LDLCALC 102 (H) 04/19/2016   Lab Results  Component Value Date   TRIG 101.0 12/30/2017   TRIG 84.0 07/04/2017   TRIG 101.0 04/19/2016   Lab Results  Component Value Date   CHOLHDL 5 12/30/2017   CHOLHDL 6 07/04/2017   CHOLHDL 4 04/19/2016   She continues Livalo  and Zetia, added after her lipid panel from 12/2017  She had normal LFTs: Lab Results  Component Value Date   ALT 10 07/04/2017   AST 12 07/04/2017   ALKPHOS 79 05/15/2016   BILITOT 0.3 07/04/2017   In spring 2018 we checked vitamin levels due to complaint of fatigue: vitamin B12 and D were low: Lab Results  Component Value Date   VD25OH 32.8 11/18/2018   VD25OH 16.8 (L) 02/07/2018   VD25OH 32.81 12/30/2017   VD25OH 23.26 (L) 07/04/2017   VD25OH 17.98 (L) 08/03/2016   VD25OH 9 (L) 05/13/2015   Lab Results  Component Value Date   VITAMINB12 584 11/18/2018   VITAMINB12 613 02/07/2018   VITAMINB12 390 12/30/2017   VITAMINB12 368 07/04/2017   VITAMINB12 315 08/03/2016   VITAMINB12 428 05/13/2015   VITAMINB12 496  01/22/2014   She continues on: -B12 5000 mcg daily, changed from B12 injections monthly during the coronavirus pandemic-not taking this, last inj -Ergocalciferol 50,000 units weekly-now better compliant  Subclinical thyrotoxicosis   TFTs normalized in the last 2 years: Lab Results  Component Value Date   TSH 0.938 11/18/2018   TSH 0.42 07/04/2017   TSH 0.97 12/04/2016   TSH 0.34 (L) 08/03/2016   TSH 0.430 (L) 06/08/2016   TSH 0.77 05/13/2015   TSH 0.619 01/22/2014   TSH 0.730 10/14/2013   TSH 0.492 09/01/2012   TSH 0.745 10/05/2011   Lab Results  Component Value Date   FREET4 0.70 07/04/2017   FREET4 0.77 12/04/2016   FREET4 0.79 08/03/2016   T3FREE 3.8 07/04/2017   T3FREE 3.4 12/04/2016   T3FREE 3.6 08/03/2016   She has a history of dysphagia and had esophageal stretching in the past.  ROS: Constitutional: no weight gain/no weight loss, no fatigue, no subjective hyperthermia, no subjective hypothermia Eyes: no blurry vision, no xerophthalmia ENT: no sore throat, no nodules palpated in neck, no dysphagia, no odynophagia, no hoarseness Cardiovascular: no CP/no SOB/no palpitations/no leg swelling Respiratory: no cough/no SOB/no wheezing Gastrointestinal: no N/no V/no D/no C/no acid reflux Musculoskeletal: no muscle aches/+ joint aches (neck) Skin: no rashes, no hair loss Neurological: no tremors/+ numbness/+ tingling/no dizziness, + headaches  I reviewed pt's medications, allergies, PMH, social hx, family hx, and changes were documented in the history of present illness. Otherwise, unchanged from my initial visit note.  Past Medical History:  Diagnosis Date  . Anxiety   . Arthritis   . Brain tumor (benign) ([name]) 2018  . Chest pain 05/15/2015  . Depression   . GERD (gastroesophageal reflux disease)   . H/O varicella   . History of measles, mumps, or rubella   . HSV-2 (herpes simplex virus 2) infection   . Hx of ovarian cyst   . Hyperlipidemia   . Hypersomnia  07/19/2015  . Migraine headache   . Plantar fasciitis   . Scapular dyskinesis 10/03/2016  . Sleep apnea    pt denies  . Sleep deprivation    loss of sleep  . 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 ABDOMINAL HYSTERECTOMY    . CHOLECYSTECTOMY  11/13/10  . COLONOSCOPY  Multiple  . DILATION AND CURETTAGE OF UTERUS    . ESOPHAGOGASTRODUODENOSCOPY    . OOPHORECTOMY  2009   bilateral salpingo-oophorectomy.  . TUBAL LIGATION     Social History   Socioeconomic History  . Marital status: Married  Spouse name: [name]  . Number of children: 2  . Years of education: 70  . Highest education level: Associate degree: academic program  Occupational History    Employer: [employer]  . Financial resource strain: Not on file  . Food insecurity    Worry: Not on file    Inability: Not on file  . Transportation needs    Medical: Not on file    Non-medical: Not on file  Tobacco Use  . Smoking status: Former Smoker    Packs/day: 0.50    Types: Cigarettes  . Smokeless tobacco: Never Used  Substance and Sexual Activity  . Alcohol use: No    Alcohol/week: 0.0 standard drinks  . Drug use: No  . Sexual activity: Yes    Partners: Male    Birth control/protection: Surgical    Comment: Hysterectomy  Lifestyle  . Physical activity    Days per week: Not on file    Minutes per session: Not on file  . Stress: Not on file  Relationships  . Social Herbalist on phone: Not on file    Gets together: Not on file    Attends religious service: Not on file    Active member of club or organization: Not on file    Attends meetings of clubs or organizations: Not on file    Relationship status: Not on file  . Intimate partner violence    Fear of current or ex partner: Not on file    Emotionally abused: Not on file    Physically abused: Not on file    Forced  sexual activity: Not on file  Other Topics Concern  . Not on file  Social History Narrative   Patient lives at home with her husband [name]). 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 Two children. Both boys      Patient is right-handed. She lives in a one level home. She drinks coffee occasionally. She does not regularly exercise.   Current Outpatient Medications on File Prior to Visit  Medication Sig Dispense Refill  . ACCU-CHEK FASTCLIX LANCETS MISC USE AS DIRECTED TO CHECK BLOOD SUGAR ONCE DAILY 102 each 5  . ACCU-CHEK GUIDE test strip USE AS DIRECTED TO CHECK BLOOD SUGAR ONCE DAILY 100 each 5  . Elastic Bandages & Supports (WRIST BRACE/LEFT MEDIUM) MISC 1 each by Does not apply route as needed. 1 each 0  . ezetimibe (ZETIA) 10 MG tablet Take 1 tablet (10 mg total) by mouth daily. 90 tablet 3  . gabapentin (NEURONTIN) 100 MG capsule Take 1 capsule in AM and 2 capsules at bedtime for one week, then 2 capsules twice daily 120 capsule 0  . ibuprofen (ADVIL) 800 MG tablet Take 800 mg by mouth 3 (three) times daily.     Marland Kitchen MAGNESIUM CITRATE PO Take by mouth.    . metFORMIN (GLUCOPHAGE-XR) 500 MG 24 hr tablet TAKE 1 TABLET BY MOUTH DAILY WITH A MEAL. (Patient taking differently: Take 500 mg by mouth daily with breakfast. ) 180 tablet 3  . oxybutynin (DITROPAN-XL) 10 MG 24 hr tablet Take 1 tablet (10 mg total) by mouth at bedtime. (Patient not taking: Reported on 12/26/2018) 30 tablet 3  . Pitavastatin Calcium (LIVALO) 2 MG TABS Use 1x a day (Patient taking differently: Take 2 mg by mouth daily. Use 1x a day) 90 tablet 3  . topiramate (TOPAMAX) 50  MG tablet Take 1 tablet (50 mg total) by mouth 2 (two) times daily. 60 tablet 5  . Vitamin D, Ergocalciferol, (DRISDOL) 1.25 MG (50000 UT) CAPS capsule Take 1 capsule (50,000 Units total) by mouth every 7 (seven) days. 12 capsule 3   No current facility-administered medications on  file prior to visit.    Allergies  Allergen Reactions  . Sulfa Antibiotics Anaphylaxis, Hives and Swelling   Family History  Problem Relation Age of Onset  . Hypertension Mother   . Hypotension Mother   . Anemia Mother        low iron  . Heart disease Sister   . Sickle cell trait Other   . Alcohol abuse Brother   . Alcohol abuse Brother   . Aneurysm Brother   . Drug abuse Brother   . Colon cancer Neg Hx   . Esophageal cancer Neg Hx   . Stomach cancer Neg Hx   . Breast cancer Neg Hx     PE: BP 120/86   Pulse 85   Ht 5\' 5"  (1.651 m) Comment: measured  Wt 201 lb (91.2 kg)   LMP  (LMP Unknown)   SpO2 97%   BMI 33.45 kg/m  Body mass index is 33.45 kg/m. Wt Readings from Last 3 Encounters:  01/02/19 201 lb (91.2 kg)  12/26/18 196 lb (88.9 kg)  12/09/18 196 lb (88.9 kg)   Constitutional: overweight, in NAD Eyes: PERRLA, EOMI, no exophthalmos ENT: moist mucous membranes, no thyromegaly, no cervical lymphadenopathy Cardiovascular: RRR, No MRG Respiratory: CTA B Gastrointestinal: abdomen soft, NT, ND, BS+ Musculoskeletal: no deformities, strength intact in all 4 Skin: moist, warm, no rashes Neurological: no tremor with outstretched hands, DTR normal in all 4   ASSESSMENT: 1. DM2, non-insulin dependent, without complications, controlled  2. HL  3. H/o Low TSH  4.  Low vitamin D   5. Low B12  PLAN:  1. Patient with history of controlled type 2 diabetes, only on low-dose Metformin, without side effects.  At last visit sugars were still great, without hyperglycemic spikes, however, she was not checking sugars after eating sweets or having other dietary indiscretions. -At this visit, sugars are still at goal, not much change from before, only slightly higher, but still well within the range, in the evening -She is preparing for a steroid injection in the neck and we discussed that this may not increase her blood sugar since they are still well controlled.  She had  another injection before and this did not change her sugars significant.  I advised her to let me know if she starts seeing higher blood numbers. -We will continue the current regimen -Reviewed latest HbA1c from 11/2018, which was still at goal, at 6.4% - I suggested to: Patient Instructions  Please continue: - Metformin ER 500 mg with dinner  Also, continue: - Ergocalciferol 50,000 units weekly - Livalo 2 mg daily - Zetia 10 mg daily - B12 injections monthly  Please stop at the lab.  Please return in 6 months with your sugar log.    - advised to check sugars at different times of the day - 1x a day, rotating check times - advised for yearly eye exams >> she is UTD - return to clinic in 6 months    2. HL -Reviewed latest lipid panel from 12/2017: LDL very high, 164, on LIvalo we added Zetia at that time. Lab Results  Component Value Date   CHOL 231 (H) 12/30/2017   HDL 47.60  12/30/2017   LDLCALC 164 (H) 12/30/2017   TRIG 101.0 12/30/2017   CHOLHDL 5 12/30/2017  -On Livalo and Zetia without SEs -We will recheck her lipids today and we discussed that we may need to switch from Livalo to Crestor.  She agrees  3. Low TSH -No thyrotoxic symptoms -This has resolved per review of her most recent TSH from 11/2018 -We will repeat TFTs at next visit  4. Low vitamin D  -Not completely compliant with a 50,000 units of ergocalciferol weekly. -Latest level was normal 11/2018  5. Low B12 -On B12 injections, but not very compliant.  Previously on B12 p.o. 5000 mcg daily but she was forgetting these.  During the coronavirus pandemic, I advised her to resume this regimen until she can restart IM injections.  However, she did not restart.  Last injection was in 06/2018. -Latest B12 was normal in 11/2018 -We will give her another injection today. I advised her to continue with these monthly.   Component     Latest Ref Rng & Units 07/04/2017 12/30/2017 01/02/2019  Cholesterol     0 -  200 mg/dL 255 (H) 231 (H) 224 (H)  Triglycerides     0.0 - 149.0 mg/dL 84.0 101.0 96.0  HDL Cholesterol     >39.00 mg/dL 42.20 47.60 47.20  VLDL     0.0 - 40.0 mg/dL 16.8 20.2 19.2  LDL (calc)     0 - 99 mg/dL 196 (H) 164 (H) 158 (H)  Total CHOL/HDL Ratio      6 5 5   NonHDL      212.50 183.77 177.22   Lipid panel still abnormal, but improving.  I would suggest to switch from Livalo to Crestor 5 mg daily.  Philemon Kingdom, MD PhD [facility]

## 2019-01-02 NOTE — Progress Notes (Signed)
Per orders of Dr. Gherghe injection of B12 given today by Melanye Hiraldo,Certified Medical Assistant. Patient tolerated injection well.  

## 2019-01-02 NOTE — Patient Instructions (Addendum)
Please continue: - Metformin ER 500 mg with dinner  Also, continue: - Ergocalciferol 50,000 units weekly - Livalo 2 mg daily - Zetia 10 mg daily - B12 injections monthly  Please stop at the lab.  Please return in 6 months with your sugar log.

## 2019-01-05 MED FILL — ROSUVASTATIN CALCIUM 5 MG T: 5 | 90 days supply | Qty: 90 | Fill #0

## 2019-01-15 MED FILL — HYDROCODON-APAP 10-325: 10-325 | 5 days supply | Qty: 15 | Fill #0

## 2019-01-20 ENCOUNTER — Other Ambulatory Visit: Payer: Self-pay | Admitting: Family Medicine

## 2019-01-20 ENCOUNTER — Telehealth: Payer: Self-pay | Admitting: Obstetrics and Gynecology

## 2019-01-20 DIAGNOSIS — Z1231 Encounter for screening mammogram for malignant neoplasm of breast: Secondary | ICD-10-CM

## 2019-01-20 NOTE — Telephone Encounter (Signed)
Spoke with [name]. [name] states gabapentin not working that was on file from Dr Loretta Plume since [place] 12/26/18. [name] states has had estrace in past and works well. [name] also states having gabapentin changed since 10/09 because it not working for" other issues"she has. [name] calling to try and schedule MMG at [facility] today and will call back to triage to update of MMG appt and will then route to Dr Quincy Simmonds for recommendations and advise on estrace.

## 2019-01-20 NOTE — Telephone Encounter (Signed)
Patient is wanting something for hot flashes. She said the estrace worked well before. [facility].

## 2019-01-20 NOTE — Telephone Encounter (Signed)
Pt returned call and has MMG scheduled at [facility] on 01/22/19 at 1040.  Pt understands will not start new estrace regimen until MMG report back. Scheduled mychart visit for 11/10 at 8am with Dr. Quincy Simmonds. Pt verbalized understanding.  Routing to provider for final review. Patient is agreeable to disposition. Will close encounter.

## 2019-01-22 ENCOUNTER — Other Ambulatory Visit: Payer: Self-pay

## 2019-01-22 ENCOUNTER — Ambulatory Visit
Admission: RE | Admit: 2019-01-22 | Discharge: 2019-01-22 | Disposition: A | Discharge status: Home / Self Care | Payer: No Typology Code available for payment source | Source: Ambulatory Visit | Attending: Family Medicine | Admitting: Family Medicine

## 2019-01-22 DIAGNOSIS — Z1231 Encounter for screening mammogram for malignant neoplasm of breast: Secondary | ICD-10-CM

## 2019-01-26 NOTE — Progress Notes (Signed)
GYNECOLOGY  VISIT   HPI: 58 y.o.   Married  [place] American  female   [phone] with No LMP recorded (lmp unknown). Patient has had a hysterectomy.   here for telephone visit to discuss returning to ERT.    Patient gives consent for telephone visit.  I called her personally after the My Chart video conference did not work.  Started at 8:10.  Ended at 8:32. I am in the office and she is in her office building in a hallway.   She states,"I am burning up." reports she is sweating terribly, day and night.   She stopped her ERT this summer because she thought she did not need it.  When she would take it regularly, it worked well. She was on Estrace 1 mg orally.    Asking also for phentermine.  She has been off of it for a while.  She wants help with weight loss.  Reporting she is eating poorly, lot of carbs.  Fasting glucose is 98 - 100.  Glucose is 121 at highest.   Sister is having surgery this week. She was to be here to help with their mother but cannot do this now.    Patient is dealing with a lot of stress due to life situations, loss of brother, mother's dementia, and personal health issues.   GYNECOLOGIC HISTORY: No LMP recorded (lmp unknown). Patient has had a hysterectomy. Contraception:  Hysterectomy Menopausal hormone therapy:  none Last mammogram: 01-22-19 3D/Neg/density C/BiRads1 Last pap smear: 11-12-16 Neg        OB History    Gravida  3   Para  2   Term  2   Preterm      AB  1   Living  2     SAB      TAB      Ectopic      Multiple      Live Births  2              Patient Active Problem List   Diagnosis Date Noted  . Olecranon bursitis of right elbow 11/28/2018  . Paresthesia 11/18/2018  . Hot flashes 10/07/2018  . Elevated BP without diagnosis of hypertension 10/07/2018  . Carpal tunnel syndrome of left wrist 05/16/2018  . Bilateral knee pain 02/07/2018  . Cervical disc disorder with radiculopathy of cervical region 12/11/2016  .  Vitamin D deficiency 12/04/2016  . Right shoulder pain 08/29/2016  . Brain tumor (benign) ([place]) 03/19/2016  . Sleep apnea 05/27/2015  . Solitary pulmonary nodule 05/27/2015  . GERD (gastroesophageal reflux disease) 05/13/2015  . Type 2 diabetes mellitus with other specified complication ([place]) [id number]  . Depression with anxiety 01/22/2014  . VAIN I (vaginal intraepithelial neoplasia grade I)   . Hyperlipidemia 10/22/2013  . Migraine headache 10/04/2008    Past Medical History:  Diagnosis Date  . Anxiety   . Arthritis   . Brain tumor (benign) ([place]) 2018  . Chest pain 05/15/2015  . Depression   . GERD (gastroesophageal reflux disease)   . H/O varicella   . History of measles, mumps, or rubella   . HSV-2 (herpes simplex virus 2) infection   . Hx of ovarian cyst   . Hyperlipidemia   . Hypersomnia 07/19/2015  . Migraine headache   . Plantar fasciitis   . Scapular dyskinesis 10/03/2016  . Sleep apnea    pt denies  . Sleep deprivation    loss of sleep  . VAIN I (vaginal intraepithelial  neoplasia grade I) 2013   pap and confirmed by colposcopic biopsy    Past Surgical History:  Procedure Laterality Date  . ABDOMINAL HYSTERECTOMY    . CHOLECYSTECTOMY  11/13/10  . COLONOSCOPY  Multiple  . DILATION AND CURETTAGE OF UTERUS    . ESOPHAGOGASTRODUODENOSCOPY    . OOPHORECTOMY  2009   bilateral salpingo-oophorectomy.  . TUBAL LIGATION      Current Outpatient Medications  Medication Sig Dispense Refill  . ACCU-CHEK FASTCLIX LANCETS MISC USE AS DIRECTED TO CHECK BLOOD SUGAR ONCE DAILY 102 each 5  . ACCU-CHEK GUIDE test strip USE AS DIRECTED TO CHECK BLOOD SUGAR ONCE DAILY 100 each 5  . Elastic Bandages & Supports (WRIST BRACE/LEFT MEDIUM) MISC 1 each by Does not apply route as needed. 1 each 0  . [START ON 01/29/2019] estradiol (VIVELLE-DOT) 0.075 MG/24HR Place 1 patch onto the skin 2 (two) times a week. 8 patch 11  . ezetimibe (ZETIA) 10 MG tablet Take 1 tablet (10 mg total) by  mouth daily. 90 tablet 3  . gabapentin (NEURONTIN) 100 MG capsule Take 1 capsule in AM and 2 capsules at bedtime for one week, then 2 capsules twice daily 120 capsule 0  . ibuprofen (ADVIL) 800 MG tablet Take 800 mg by mouth 3 (three) times daily.     Marland Kitchen MAGNESIUM CITRATE PO Take by mouth.    . metFORMIN (GLUCOPHAGE-XR) 500 MG 24 hr tablet TAKE 1 TABLET BY MOUTH DAILY WITH A MEAL. (Patient taking differently: Take 500 mg by mouth daily with breakfast. ) 180 tablet 3  . oxybutynin (DITROPAN-XL) 10 MG 24 hr tablet Take 1 tablet (10 mg total) by mouth at bedtime. 30 tablet 3  . phentermine 37.5 MG capsule Take 1 capsule (37.5 mg total) by mouth every morning. 30 capsule 0  . Pitavastatin Calcium (LIVALO) 2 MG TABS Use 1x a day (Patient taking differently: Take 2 mg by mouth daily. Use 1x a day) 90 tablet 3  . rosuvastatin (CRESTOR) 5 MG tablet Take 1 tablet (5 mg total) by mouth daily. 90 tablet 3  . topiramate (TOPAMAX) 50 MG tablet Take 1 tablet (50 mg total) by mouth 2 (two) times daily. 60 tablet 5  . Vitamin D, Ergocalciferol, (DRISDOL) 1.25 MG (50000 UT) CAPS capsule Take 1 capsule (50,000 Units total) by mouth every 7 (seven) days. 12 capsule 3   No current facility-administered medications for this visit.      ALLERGIES: Sulfa antibiotics  Family History  Problem Relation Age of Onset  . Hypertension Mother   . Hypotension Mother   . Anemia Mother        low iron  . Heart disease Sister   . Sickle cell trait Other   . Alcohol abuse Brother   . Alcohol abuse Brother   . Aneurysm Brother   . Drug abuse Brother   . Colon cancer Neg Hx   . Esophageal cancer Neg Hx   . Stomach cancer Neg Hx   . Breast cancer Neg Hx     Social History   Socioeconomic History  . Marital status: Married    Spouse name: [name]  . Number of children: 2  . Years of education: 22  . Highest education level: Associate degree: academic program  Occupational History    Employer: [employer]  . Financial resource strain: Not on file  . Food insecurity    Worry: Not on file    Inability: Not on file  .  Transportation needs    Medical: Not on file    Non-medical: Not on file  Tobacco Use  . Smoking status: Former Smoker    Packs/day: 0.50    Types: Cigarettes  . Smokeless tobacco: Never Used  Substance and Sexual Activity  . Alcohol use: No    Alcohol/week: 0.0 standard drinks  . Drug use: No  . Sexual activity: Yes    Partners: Male    Birth control/protection: Surgical    Comment: Hysterectomy  Lifestyle  . Physical activity    Days per week: Not on file    Minutes per session: Not on file  . Stress: Not on file  Relationships  . Social Herbalist on phone: Not on file    Gets together: Not on file    Attends religious service: Not on file    Active member of club or organization: Not on file    Attends meetings of clubs or organizations: Not on file    Relationship status: Not on file  . Intimate partner violence    Fear of current or ex partner: Not on file    Emotionally abused: Not on file    Physically abused: Not on file    Forced sexual activity: Not on file  Other Topics Concern  . Not on file  Social History Narrative   Patient lives at home with her husband [name]). 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 Two children. Both boys      Patient is right-handed. She lives in a one level home. She drinks coffee occasionally. She does not regularly exercise.    Review of Systems  See above.   PHYSICAL EXAMINATION:    No vitals.     ASSESSMENT  Menopausal symptoms.  Desire for weight loss. DM.  Bereavement.  Life stress.   PLAN  We reviewed WHI and risks of ERT - stroke, DVT, PE.  We also reviewed the benefits of treatment.  She wishes to go back on ERT and will start Vivelle Dot 0.075 mg twice weekly.  #8, RF 11.  Phentermine 37.5 mg daily.  #30, RF none.   She will need to return to the office for a visit in one month for refills of Phentermine.  I recommend she contact Hospice, [facility] [place] [facility] counseling, or Employee at Work for support.  Fu prn.   An After Visit Summary was printed and given to the patient.  __22____ minute phone consultation.

## 2019-01-27 ENCOUNTER — Telehealth (INDEPENDENT_AMBULATORY_CARE_PROVIDER_SITE_OTHER): Payer: No Typology Code available for payment source | Admitting: Obstetrics and Gynecology

## 2019-01-27 ENCOUNTER — Other Ambulatory Visit: Payer: Self-pay

## 2019-01-27 DIAGNOSIS — Z7689 Persons encountering health services in other specified circumstances: Secondary | ICD-10-CM | POA: Diagnosis not present

## 2019-01-27 DIAGNOSIS — Z8659 Personal history of other mental and behavioral disorders: Secondary | ICD-10-CM | POA: Diagnosis not present

## 2019-01-27 DIAGNOSIS — N951 Menopausal and female climacteric states: Secondary | ICD-10-CM

## 2019-01-27 MED ORDER — PHENTERMINE HCL 37.5 MG PO CAPS: 37.5000 mg | ORAL_CAPSULE | ORAL | 0 refills | Status: DC

## 2019-01-27 MED ORDER — ESTRADIOL 0.075 MG/24HR TD PTTW: 1.0000 | MEDICATED_PATCH | TRANSDERMAL | 11 refills | Status: DC

## 2019-01-27 MED FILL — ESTRADIOL 0.075 MG PATCH: 0.075 | 28 days supply | Qty: 8 | Fill #0

## 2019-01-27 MED FILL — PHENTERMINE 37.5 MG TABLET: 37.5 | 30 days supply | Qty: 30 | Fill #0

## 2019-01-30 ENCOUNTER — Encounter: Payer: Self-pay | Admitting: Obstetrics and Gynecology

## 2019-02-05 MED FILL — HYDROCODON-APAP 10-325: 10-325 | 5 days supply | Qty: 15 | Fill #0

## 2019-02-16 ENCOUNTER — Other Ambulatory Visit: Payer: Self-pay | Admitting: Internal Medicine

## 2019-02-16 MED FILL — METFORMIN HCL ER 500 MG TB2: 500 | 90 days supply | Qty: 90 | Fill #0

## 2019-02-16 MED FILL — ACCU-CHEK GUIDE TEST STRIP: 90 days supply | Qty: 100 | Fill #0

## 2019-02-16 MED FILL — ACCU-CHEK FASTCLIX LANCETS: 90 days supply | Qty: 102 | Fill #0

## 2019-02-18 MED FILL — IBUPROFEN 800 MG TABS: 800 | 30 days supply | Qty: 90 | Fill #1

## 2019-03-06 MED FILL — ESTRADIOL 0.075 MG PATCH: 0.075 | 28 days supply | Qty: 8 | Fill #1

## 2019-03-31 ENCOUNTER — Other Ambulatory Visit: Payer: Self-pay

## 2019-03-31 ENCOUNTER — Ambulatory Visit (INDEPENDENT_AMBULATORY_CARE_PROVIDER_SITE_OTHER): Payer: No Typology Code available for payment source | Admitting: Family Medicine

## 2019-03-31 VITALS — BP 128/74

## 2019-03-31 DIAGNOSIS — M5010 Cervical disc disorder with radiculopathy, unspecified cervical region: Secondary | ICD-10-CM

## 2019-03-31 MED ORDER — MELOXICAM 15 MG PO TABS: 15.0000 mg | ORAL_TABLET | Freq: Every day | ORAL | 1 refills | Status: DC

## 2019-03-31 MED ORDER — CYCLOBENZAPRINE HCL 10 MG PO TABS: 10.0000 mg | ORAL_TABLET | Freq: Three times a day (TID) | ORAL | 0 refills | Status: DC | PRN

## 2019-03-31 MED FILL — CYCLOBENZAPRINE 10 MG TAB: 10 | 10 days supply | Qty: 30 | Fill #0

## 2019-03-31 MED FILL — MELOXICAM 15 MG TABLET: 15 | 30 days supply | Qty: 30 | Fill #0

## 2019-03-31 NOTE — Patient Instructions (Signed)
I'm sorry that your neck and shoulder pain is worsening.  Let's try the following:  -Stop ibuprofen -Start Mobic once daily -Follow up with sports medicine, call use if you haven't received a call in 2 weeks. -Muscle relaxer at night

## 2019-03-31 NOTE — Assessment & Plan Note (Addendum)
Very likely secondary to cervical pathology.  Physical exam supports a lesion of C7/C8.  Previous MRI of the cervical spine on 09/2017 shows disc degeneration of C5-7 in addition to foraminal impingement of the left C5-7 foramina.  Unlikely carpal tunnel, unlikely ulnar nerve pathology, low suspicion for diabetic neuropathy of the upper extremities.  Recent B12 testing within normal limits. Very low suspicion for cardiac etiology of left arm numbness - not exertional, no associated chest pain/jaw pain. -New referral to sports medicine per patient request -Stop ibuprofen -Start Mobic once daily -Start Flexeril at night if helpful -Patient declined referral to physical therapy

## 2019-03-31 NOTE — Progress Notes (Addendum)
    Subjective:  [name] is a 59 y.o. female who presents to the [facility] today with a chief complaint of new left-sided neck pain/left hand numbness.   HPI:  Neck pain/left hand numbness Ms. [name] has a history of multiple musculoskeletal issues primarily on her right side which include: Rotator cuff tear, olecranon bursitis, carpal tunnel, cervical radiculopathy secondary to disc degeneration and foraminal impingement.  Today, she would like to discuss new numbness and tingling on her left side in addition to left-sided neck aching.  She has previously had right arm numbness and tingling related to her cervical pathology but has never had left arm numbness and tingling.  She reports that this numbing sensation is felt from her elbow to her fingertips she most notices this mostly at night.  She is not aware of any activities that exacerbate this numbness.  The numbness is not isolated to any particular fingers.  All of her musculoskeletal issues have become increasingly burdensome because of the difficulty sleeping.  She now often sleeps in a recliner to avoid bothering her husband with her constant turning and waking up at night.  She has previously been seen by orthopedic surgery multiple times to assess her shoulder and neck issues.  She considered returning to this practice to assess this new numbness on her left side but decided to be seen at the family medicine clinic instead.  She is dissatisfied with her treatment at orthopedics presently.  She feels that she does not often get a straight answer.  She has previously been to physical therapy to help address her neck pain but has not found that helpful.  She is currently taking ibuprofen 800 mg 4 times daily and gabapentin for neck pain.   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 Palpation: No significant tenderness to palpation of the left elbow, forearm, hand. ROM: Full active ROM of the left shoulder.  Mild pain with lowering her arm overhead lifting above her head. Strength: 5/5 strength for elbow flexion, extension, grip strength Neuro: 2+ biceps bilaterally, diminished triceps bilaterally.  Sensation to light touch intact bilaterally Special tests: Positive reproduction of neck and left shoulder discomfort with axial pressure applied to the head with the neck extended and rotated to the left.  No reproduction of the pain with Tinel's test or Phalen's test.   No results found for this or any previous visit (from the past 72 hour(s)).   Assessment/Plan:  Cervical disc disorder with radiculopathy of cervical region Very likely secondary to cervical pathology.  Physical exam supports a lesion of C7/C8.  Previous MRI of the cervical spine on 09/2017 shows disc degeneration of C5-7 in addition to foraminal impingement of the left C5-7 foramina.  Unlikely carpal tunnel, unlikely ulnar nerve pathology, low suspicion for diabetic neuropathy of the upper extremities.  Recent B12 testing within normal limits. Very low suspicion for cardiac etiology of left arm numbness - not exertional, no associated chest pain/jaw pain. -New referral to sports medicine per patient request -Stop ibuprofen -Start Mobic once daily -Start Flexeril at night if helpful -Patient declined referral to physical therapy

## 2019-04-08 ENCOUNTER — Ambulatory Visit: Payer: No Typology Code available for payment source | Admitting: Family Medicine

## 2019-04-08 ENCOUNTER — Other Ambulatory Visit: Payer: Self-pay

## 2019-04-08 ENCOUNTER — Ambulatory Visit: Payer: No Typology Code available for payment source | Admitting: Sports Medicine

## 2019-04-08 ENCOUNTER — Encounter: Payer: Self-pay | Admitting: Sports Medicine

## 2019-04-08 VITALS — BP 120/86 | Ht 65.0 in | Wt 196.0 lb

## 2019-04-08 DIAGNOSIS — M5010 Cervical disc disorder with radiculopathy, unspecified cervical region: Secondary | ICD-10-CM | POA: Diagnosis not present

## 2019-04-08 NOTE — Progress Notes (Addendum)
   [facility] [address] [place], [place] [address] Phone: [phone] Fax: [phone]   Patient Name: [name] Date of Birth: [birth year] Medical Record Number: [id number] Gender: female Date of Encounter: 04/08/2019  SUBJECTIVE:      Chief Complaint:  Neck pain with radiculopathy   HPI:  [name] is a 59 year old female presenting with acute on chronic neck pain with radiculopathy.  States the symptoms first started in 2018 and have gradually progressed.  She has been told she has a torn rotator cuff in the right shoulder, but the symptoms that are plaguing her are likely originating from the neck.  She had a MRI of her cervical spine in October 2020 that she was not able to access for [place] today.  She has trialed multiple p.o. medications, steroid injections in her neck, and formal physical therapy with little relief.  She is starting to have more radiating pain down her left arm.  She is using meloxicam that is not helping.  She was given Flexeril, but states it is very sedating for her.  He denies any swelling or injury to the neck or shoulders.   ROS:     See HPI.   PERTINENT  PMH / PSH / FH / SH:  Past Medical, Surgical, Social, and Family History Reviewed & Updated in the EMR. Pertinent findings include:  Benign brain tumor, scapular dyskinesis, rotator cuff tear   OBJECTIVE:  BP 120/86   Ht 5\' 5"  (1.651 m)   Wt 196 lb (88.9 kg)   LMP  (LMP Unknown)   BMI 32.62 kg/m  Physical Exam:  Vital signs are reviewed.   GEN: Alert and oriented, NAD Pulm: Breathing unlabored PSY: normal mood, congruent affect  MSK: Neck No gross deformity, swelling, bruising. TTP at left paraspinal muscles FROM. BUE strength 4/5.   Sensation intact to light touch.   2+ equal reflexes in triceps, biceps, brachioradialis tendons. Positive spurlings. NV intact distal BUEs.  I reviewed all imaging pertinent to her neck and shoulders available in  our system today.   ASSESSMENT & PLAN:   1. Chronic neck pain with radiculopathy  Given that patient has provocative findings on exam today in the setting of failed conservative measures, I will refer her to a spine surgeon for further evaluation.  She is to bring the MRI from October to that appointment.  If she warrants no surgical intervention, I would recommend a repeat of the steroid injections into the neck.  I am happy to see her back on an as-needed basis.   Lanier Clam, DO, ATC Sports Medicine Fellow  Addendum:  I was the preceptor for this visit and available for immediate consultation.  Karlton Lemon MD [facility]

## 2019-04-08 NOTE — Patient Instructions (Signed)
We have scheduled you to see Dr. Lynann Bologna for your neck pain. He is with [facility] [phone]  Appt: Monday 04/20/2019 @ 11 am. Please arrive at 10:15 am to fill out paperwork. Please contact [facility] to get a copy of your MRI on a disc to bring to your appt.

## 2019-04-15 ENCOUNTER — Telehealth: Payer: Self-pay

## 2019-04-15 MED ORDER — PREDNISONE 20 MG PO TABS: 20.0000 mg | ORAL_TABLET | Freq: Every day | ORAL | 0 refills | Status: DC

## 2019-04-15 MED FILL — predniSONE 20 MG TABS: 20 | 5 days supply | Qty: 5 | Fill #0

## 2019-04-15 NOTE — Telephone Encounter (Signed)
-----   Message from [facility] sent at 04/14/2019  4:16 PM EST ----- Regarding: med request She is requesting pain meds for her Neck and shoulder/arm pain. Meloxicam is not helping pain. Pharmacy is next door.

## 2019-04-15 NOTE — Telephone Encounter (Signed)
Per Dr. Kathrynn Speed we will try 5 days of prednisone 20 mg. She was supposed to see Dr. Lynann Bologna on 04/20/19 but they were able to get her in on 04/17/19. She's aware she can discuss further pain medication with Dr. Lynann Bologna at her appt.

## 2019-04-21 MED FILL — traMADol HCL 50 MG TABS: 50 | 10 days supply | Qty: 60 | Fill #0

## 2019-04-21 MED FILL — ESTRADIOL 0.075 MG PATCH: 0.075 | 28 days supply | Qty: 8 | Fill #2

## 2019-04-28 ENCOUNTER — Other Ambulatory Visit: Payer: Self-pay | Admitting: Orthopedic Surgery

## 2019-04-28 ENCOUNTER — Other Ambulatory Visit: Payer: Self-pay

## 2019-04-28 ENCOUNTER — Encounter: Payer: Self-pay | Admitting: Family Medicine

## 2019-04-28 ENCOUNTER — Ambulatory Visit (INDEPENDENT_AMBULATORY_CARE_PROVIDER_SITE_OTHER): Payer: No Typology Code available for payment source | Admitting: Family Medicine

## 2019-04-28 VITALS — BP 130/80 | HR 106 | Wt 196.0 lb

## 2019-04-28 DIAGNOSIS — F331 Major depressive disorder, recurrent, moderate: Secondary | ICD-10-CM

## 2019-04-28 DIAGNOSIS — E1169 Type 2 diabetes mellitus with other specified complication: Secondary | ICD-10-CM | POA: Diagnosis not present

## 2019-04-28 DIAGNOSIS — Z85841 Personal history of malignant neoplasm of brain: Secondary | ICD-10-CM | POA: Insufficient documentation

## 2019-04-28 DIAGNOSIS — M25431 Effusion, right wrist: Secondary | ICD-10-CM

## 2019-04-28 DIAGNOSIS — F418 Other specified anxiety disorders: Secondary | ICD-10-CM

## 2019-04-28 DIAGNOSIS — M25439 Effusion, unspecified wrist: Secondary | ICD-10-CM | POA: Insufficient documentation

## 2019-04-28 DIAGNOSIS — E119 Type 2 diabetes mellitus without complications: Secondary | ICD-10-CM | POA: Diagnosis not present

## 2019-04-28 DIAGNOSIS — M5010 Cervical disc disorder with radiculopathy, unspecified cervical region: Secondary | ICD-10-CM

## 2019-04-28 DIAGNOSIS — C719 Malignant neoplasm of brain, unspecified: Secondary | ICD-10-CM | POA: Insufficient documentation

## 2019-04-28 HISTORY — DX: Personal history of malignant neoplasm of brain: Z85.841

## 2019-04-28 MED ORDER — ZOSTER VAC RECOMB ADJUVANTED 50 MCG/0.5ML IM SUSR: 0.5000 mL | Freq: Once | INTRAMUSCULAR | 1 refills | Status: AC

## 2019-04-28 NOTE — Assessment & Plan Note (Signed)
Per patient, she is scheduled for cervical diskectomy. Labs done today incase she will require medical clearance. Continue Tramadol as needed.

## 2019-04-28 NOTE — Progress Notes (Addendum)
Patient ID: [name], female   DOB: 12-17-1960, 59 y.o.   MRN: [id number]    CHIEF COMPLAINT / HPI: Wrist swelling: C/O swelling of her right wrist which started few months ago, but worsened a bit in the last few weeks. She denies any redness or pain, no injury to her right wrist. She has hx of right elbow bursitis and right rotator cuff.  Shoulder/Neck pain: Here to f/u for her left cervical radiculopathy. Per the patient, she is scheduled for surgery on 05/07/19. She is uncertain if she will need surgical clearance. Currently her pain is about 7/10 in severity. Gabapentin 300 mg TID does not help, hence she d./ced it. She tried Tylenol, Ibuprofen, Voltaren gel, Morbic, with no improvement. She was recently started on Tramadol 50 mg TID prn by orthopedic which helps some. Hx of Depression: She denies any concern. She is doing well.   PERTINENT  PMH / PSH: Record reviewed   OBJECTIVE: BP 130/80   Pulse (!) 106   Wt 196 lb (88.9 kg)   LMP  (LMP Unknown)   SpO2 100%   BMI 32.62 kg/m    Physical Exam Vitals and nursing note reviewed.  Cardiovascular:     Rate and Rhythm: Normal rate and regular rhythm.     Heart sounds: Normal heart sounds. No murmur.  Pulmonary:     Effort: Pulmonary effort is normal. No respiratory distress.     Breath sounds: Normal breath sounds. No wheezing or rhonchi.  Musculoskeletal:     Right shoulder: Tenderness present. No crepitus. Decreased range of motion.     Left shoulder: Tenderness present. No crepitus. Decreased range of motion.     Right elbow: No swelling. Tenderness present.     Left elbow: Normal.     Right hand: Swelling present. No tenderness. Normal range of motion. Normal pulse.     Left hand: Normal. Normal pulse.     Cervical back: Normal range of motion.     Comments: Mild swelling/puffiness of the tissue over her right forearm, just above the wrist. No tenderness or redness.  Neurological:     General: No focal deficit  present.     Mental Status: She is alert.  Psychiatric:        Mood and Affect: Mood normal.        Thought Content: Thought content normal.        Judgment: Judgment normal.      Office Visit from 04/28/2019 in [facility]  PHQ-9 Total Score  4       ASSESSMENT / PLAN:  Wrist swelling Etiology unclear. Does not seems to be an acute intraarticular pathology. ?? 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 Compression brace and monitoring recommended. Red flag signs discussed. Xray offered, although likely will not yield any good information. She prefers not to xray for now. Monitor closely.  Cervical disc disorder with radiculopathy of cervical region Per patient, she is scheduled for cervical diskectomy. Labs done today incase she will require medical clearance. Continue Tramadol as needed.  Depression with anxiety Doing well without meds. PHQ9 score of 4. No action required at this time.     Andrena Mews, MD [place]  Shingrix and COVID-19 vaccine discussed. I escribed her Shingrix to her pharmacy. She is undecided about COVID-19 vaccine yet.

## 2019-04-28 NOTE — Assessment & Plan Note (Signed)
Doing well without meds. PHQ9 score of 4. No action required at this time.

## 2019-04-28 NOTE — Patient Instructions (Signed)
Zoster Vaccine, Recombinant injection What is this medicine? ZOSTER VACCINE (ZOS ter vak SEEN) is used to prevent shingles in adults 58 years old and over. This vaccine is not used to treat shingles or nerve pain from shingles. This medicine may be used for other purposes; ask your health care provider or pharmacist if you have questions. COMMON BRAND NAME(S): SHINGRIX What should I tell my health care provider before I take this medicine? They need to know if you have any of these conditions:  blood disorders or disease  cancer like leukemia or lymphoma  immune system problems or therapy  an unusual or allergic reaction to vaccines, other medications, foods, dyes, or preservatives  pregnant or trying to get pregnant  breast-feeding How should I use this medicine? This vaccine is for injection in a muscle. It is given by a health care professional. Talk to your pediatrician regarding the use of this medicine in children. This medicine is not approved for use in children. Overdosage: If you think you have taken too much of this medicine contact a poison control center or emergency room at once. NOTE: This medicine is only for you. Do not share this medicine with others. What if I miss a dose? Keep appointments for follow-up (booster) doses as directed. It is important not to miss your dose. Call your doctor or health care professional if you are unable to keep an appointment. What may interact with this medicine?  medicines that suppress your immune system  medicines to treat cancer  steroid medicines like prednisone or cortisone This list may not describe all possible interactions. Give your health care provider a list of all the medicines, herbs, non-prescription drugs, or dietary supplements you use. Also tell them if you smoke, drink alcohol, or use illegal drugs. Some items may interact with your medicine. What should I watch for while using this medicine? Visit your doctor for  regular check ups. This vaccine, like all vaccines, may not fully protect everyone. What side effects may I notice from receiving this medicine? Side effects that you should report to your doctor or health care professional as soon as possible:  allergic reactions like skin rash, itching or hives, swelling of the face, lips, or tongue  breathing problems Side effects that usually do not require medical attention (report these to your doctor or health care professional if they continue or are bothersome):  chills  headache  fever  nausea, vomiting  redness, warmth, pain, swelling or itching at site where injected  tiredness This list may not describe all possible side effects. Call your doctor for medical advice about side effects. You may report side effects to FDA at 1-800-FDA-1088. Where should I keep my medicine? This vaccine is only given in a clinic, pharmacy, doctor's office, or other health care setting and will not be stored at home. NOTE: This sheet is a summary. It may not cover all possible information. If you have questions about this medicine, talk to your doctor, pharmacist, or health care provider.  2020 Elsevier/Gold Standard (2016-10-15 13:20:30)  

## 2019-04-28 NOTE — Assessment & Plan Note (Signed)
Etiology unclear. Does not seems to be an acute intraarticular pathology. ?? 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 Compression brace and monitoring recommended. Red flag signs discussed. Xray offered, although likely will not yield any good information. She prefers not to xray for now. Monitor closely.

## 2019-04-29 LAB — CBC WITH DIFFERENTIAL/PLATELET
Basophils Absolute: 0 10*3/uL (ref 0.0–0.2)
Basos: 1 %
EOS (ABSOLUTE): 0.2 10*3/uL (ref 0.0–0.4)
Eos: 3 %
Hematocrit: 39.9 % (ref 34.0–46.6)
Hemoglobin: 13.3 g/dL (ref 11.1–15.9)
Immature Grans (Abs): 0 10*3/uL (ref 0.0–0.1)
Immature Granulocytes: 0 %
Lymphocytes Absolute: 2.2 10*3/uL (ref 0.7–3.1)
Lymphs: 36 %
MCH: 28.7 pg (ref 26.6–33.0)
MCHC: 33.3 g/dL (ref 31.5–35.7)
MCV: 86 fL (ref 79–97)
Monocytes Absolute: 0.4 10*3/uL (ref 0.1–0.9)
Monocytes: 7 %
Neutrophils Absolute: 3.4 10*3/uL (ref 1.4–7.0)
Neutrophils: 53 %
Platelets: 323 10*3/uL (ref 150–450)
RBC: 4.63 x10E6/uL (ref 3.77–5.28)
RDW: 13.4 % (ref 11.7–15.4)
WBC: 6.3 10*3/uL (ref 3.4–10.8)

## 2019-04-29 LAB — PROTIME-INR
INR: 1 (ref 0.9–1.2)
Prothrombin Time: 10.5 s (ref 9.1–12.0)

## 2019-04-29 LAB — BASIC METABOLIC PANEL
BUN/Creatinine Ratio: 12 (ref 9–23)
BUN: 11 mg/dL (ref 6–24)
CO2: 20 mmol/L (ref 20–29)
Calcium: 9.5 mg/dL (ref 8.7–10.2)
Chloride: 105 mmol/L (ref 96–106)
Creatinine, Ser: 0.92 mg/dL (ref 0.57–1.00)
GFR calc Af Amer: 80 mL/min/{1.73_m2} (ref >59)
GFR calc non Af Amer: 69 mL/min/{1.73_m2} (ref >59)
Glucose: 107 mg/dL — ABNORMAL HIGH (ref 65–99)
Potassium: 3.7 mmol/L (ref 3.5–5.2)
Sodium: 141 mmol/L (ref 134–144)

## 2019-04-29 NOTE — Progress Notes (Addendum)
[place], [place] - [address] [facility]. [address] [place] [address] Phone: [phone] Fax: [phone]      Your procedure is scheduled on Thursday May 07, 2019.  Report to [facility]" at 12:30 P.M., and check in at the Admitting office.  Call this number if you have problems the morning of surgery:  [phone]  Call [phone] if you have any questions prior to your surgery date Monday-Friday 8am-4pm    Remember:  Do not eat after midnight the night before your surgery  You may drink clear liquids until 12:30 P.M day of your surgery.   Clear liquids allowed are: Water, Non-Citrus Juices (without pulp), Carbonated Beverages, Clear Tea, Black Coffee Only, and Gatorade  Please complete your PRE-SURGERY 10 oz WATER that was provided to you by 12:30 P.M day of surgery.  Please, if able, drink it in one setting. DO NOT SIP.   Take these medicines the morning of surgery with A SIP OF WATER:  cyclobenzaprine (FLEXERIL) - as needed  ezetimibe (ZETIA)  rosuvastatin (CRESTOR)  traMADol (ULTRAM) - as needed  7 days prior to surgery STOP taking any Aspirin (unless otherwise instructed by your surgeon), Aleve, Naproxen, Ibuprofen, Motrin, Advil, Goody's, BC's, all herbal medications, fish oil, and all vitamins.   WHAT DO I DO ABOUT MY DIABETES MEDICATION?  . THE NIGHT BEFORE SURGERY, metFORMIN (GLUCOPHAGE-XR) as usual      . THE MORNING OF SURGERY, do not take any ORAL diabetic medication - metFORMIN (GLUCOPHAGE-XR)   HOW TO MANAGE YOUR DIABETES BEFORE AND AFTER SURGERY  Why is it important to control my blood sugar before and after surgery? . Improving blood sugar levels before and after surgery helps healing and can limit problems. . A way of improving blood sugar control is eating a healthy diet by: o  Eating less sugar and carbohydrates o  Increasing activity/exercise o  Talking with your doctor about  reaching your blood sugar goals . High blood sugars (greater than 180 mg/dL) can raise your risk of infections and slow your recovery, so you will need to focus on controlling your diabetes during the weeks before surgery. . Make sure that the doctor who takes care of your diabetes knows about your planned surgery including the date and location.  How do I manage my blood sugar before surgery? . Check your blood sugar at least 4 times a day, starting 2 days before surgery, to make sure that the level is not too high or low. . Check your blood sugar the morning of your surgery when you wake up and every 2 hours until you get to the Short Stay unit. o If your blood sugar is less than 70 mg/dL, you will need to treat for low blood sugar: - Do not take insulin. - Treat a low blood sugar (less than 70 mg/dL) with  cup of clear juice (cranberry or apple), 4 glucose tablets, OR glucose gel. - Recheck blood sugar in 15 minutes after treatment (to make sure it is greater than 70 mg/dL). If your blood sugar is not greater than 70 mg/dL on recheck, call [phone] for further instructions. . Report your blood sugar to the short stay nurse when you get to Short Stay.  . If you are admitted to the hospital after surgery: o Your blood sugar will be checked by the staff and you will probably be given insulin after surgery (instead of oral diabetes medicines) to  make sure you have good blood sugar levels. o The goal for blood sugar control after surgery is 80-180 mg/dL.     The Morning of Surgery  Do not wear jewelry, make-up or nail polish.  Do not wear lotions, powders, perfumes, or deodorant  Do not shave 48 hours prior to surgery.    Do not bring valuables to the hospital.  [facility] is not responsible for any belongings or valuables.  If you are a smoker, DO NOT Smoke 24 hours prior to surgery  If you wear a CPAP at night please bring your mask the morning of surgery   Remember that you must  have someone to transport you home after your surgery, and remain with you for 24 hours if you are discharged the same day.   Please bring cases for contacts, glasses, hearing aids, dentures or bridgework because it cannot be worn into surgery.    Leave your suitcase in the car.  After surgery it may be brought to your room.  For patients admitted to the hospital, discharge time will be determined by your treatment team.  Patients discharged the day of surgery will not be allowed to drive home.    Special instructions:   - Preparing For Surgery  Before surgery, you can play an important role. Because skin is not sterile, your skin needs to be as free of germs as possible. You can reduce the number of germs on your skin by washing with CHG (chlorahexidine gluconate) Soap before surgery.  CHG is an antiseptic cleaner which kills germs and bonds with the skin to continue killing germs even after washing.    Oral Hygiene is also important to reduce your risk of infection.  Remember - BRUSH YOUR TEETH THE MORNING OF SURGERY WITH YOUR REGULAR TOOTHPASTE  Please do not use if you have an allergy to CHG or antibacterial soaps. If your skin becomes reddened/irritated stop using the CHG.  Do not shave (including legs and underarms) for at least 48 hours prior to first CHG shower. It is OK to shave your face.  Please follow these instructions carefully.   1. Shower the NIGHT BEFORE SURGERY and the MORNING OF SURGERY with CHG Soap.   2. If you chose to wash your hair, wash your hair first as usual with your normal shampoo.  3. After you shampoo, rinse your hair and body thoroughly to remove the shampoo.  4. Use CHG as you would any other liquid soap. You can apply CHG directly to the skin and wash gently with a scrungie or a clean washcloth.   5. Apply the CHG Soap to your body ONLY FROM THE NECK DOWN.  Do not use on open wounds or open sores. Avoid contact with your eyes, ears, mouth  and genitals (private parts). Wash Face and genitals (private parts)  with your normal soap.   6. Wash thoroughly, paying special attention to the area where your surgery will be performed.  7. Thoroughly rinse your body with warm water from the neck down.  8. DO NOT shower/wash with your normal soap after using and rinsing off the CHG Soap.  9. Pat yourself dry with a CLEAN TOWEL.  10. Wear CLEAN PAJAMAS to bed the night before surgery, wear comfortable clothes the morning of surgery  11. Place CLEAN SHEETS on your bed the night of your first shower and DO NOT SLEEP WITH PETS.    Day of Surgery:  Please shower the morning of surgery  with the CHG soap Do not apply any deodorants/lotions. Please wear clean clothes to the hospital/surgery center.   Remember to brush your teeth WITH YOUR REGULAR TOOTHPASTE.   Please read over the following fact sheets that you were given.

## 2019-04-30 ENCOUNTER — Encounter (HOSPITAL_COMMUNITY)
Admission: RE | Admit: 2019-04-30 | Discharge: 2019-04-30 | Disposition: A | Discharge status: Home / Self Care | Payer: No Typology Code available for payment source | Source: Ambulatory Visit | Attending: Orthopedic Surgery | Admitting: Orthopedic Surgery

## 2019-04-30 ENCOUNTER — Other Ambulatory Visit: Payer: Self-pay

## 2019-04-30 ENCOUNTER — Encounter (HOSPITAL_COMMUNITY): Payer: Self-pay

## 2019-04-30 DIAGNOSIS — Z01810 Encounter for preprocedural cardiovascular examination: Secondary | ICD-10-CM | POA: Diagnosis present

## 2019-04-30 DIAGNOSIS — Z01812 Encounter for preprocedural laboratory examination: Secondary | ICD-10-CM | POA: Diagnosis not present

## 2019-04-30 DIAGNOSIS — I10 Essential (primary) hypertension: Secondary | ICD-10-CM | POA: Diagnosis not present

## 2019-04-30 HISTORY — DX: Essential (primary) hypertension: I10

## 2019-04-30 HISTORY — DX: Other complications of anesthesia, initial encounter: T88.59XA

## 2019-04-30 HISTORY — DX: Nausea with vomiting, unspecified: R11.2

## 2019-04-30 HISTORY — DX: Other specified postprocedural states: Z98.890

## 2019-04-30 LAB — PROTIME-INR
INR: 1 (ref 0.8–1.2)
Prothrombin Time: 12.7 seconds (ref 11.4–15.2)

## 2019-04-30 LAB — CBC WITH DIFFERENTIAL/PLATELET
Abs Immature Granulocytes: 0.01 10*3/uL (ref 0.00–0.07)
Basophils Absolute: 0 10*3/uL (ref 0.0–0.1)
Basophils Relative: 0 %
Eosinophils Absolute: 0.2 10*3/uL (ref 0.0–0.5)
Eosinophils Relative: 3 %
HCT: 43.9 % (ref 36.0–46.0)
Hemoglobin: 14 g/dL (ref 12.0–15.0)
Immature Granulocytes: 0 %
Lymphocytes Relative: 43 %
Lymphs Abs: 2.8 10*3/uL (ref 0.7–4.0)
MCH: 28.5 pg (ref 26.0–34.0)
MCHC: 31.9 g/dL (ref 30.0–36.0)
MCV: 89.4 fL (ref 80.0–100.0)
Monocytes Absolute: 0.5 10*3/uL (ref 0.1–1.0)
Monocytes Relative: 8 %
Neutro Abs: 3 10*3/uL (ref 1.7–7.7)
Neutrophils Relative %: 46 %
Platelets: 325 10*3/uL (ref 150–400)
RBC: 4.91 MIL/uL (ref 3.87–5.11)
RDW: 13.7 % (ref 11.5–15.5)
WBC: 6.5 10*3/uL (ref 4.0–10.5)
nRBC: 0 % (ref 0.0–0.2)

## 2019-04-30 LAB — HEMOGLOBIN A1C
Hgb A1c MFr Bld: 6.5 % — ABNORMAL HIGH (ref 4.8–5.6)
Mean Plasma Glucose: 139.85 mg/dL

## 2019-04-30 LAB — URINALYSIS, ROUTINE W REFLEX MICROSCOPIC
Bilirubin Urine: NEGATIVE
Glucose, UA: NEGATIVE mg/dL
Hgb urine dipstick: NEGATIVE
Ketones, ur: NEGATIVE mg/dL
Leukocytes,Ua: NEGATIVE
Nitrite: NEGATIVE
Protein, ur: NEGATIVE mg/dL
Specific Gravity, Urine: 1.019 (ref 1.005–1.030)
pH: 7 (ref 5.0–8.0)

## 2019-04-30 LAB — COMPREHENSIVE METABOLIC PANEL
ALT: 15 U/L (ref 0–44)
AST: 16 U/L (ref 15–41)
Albumin: 3.8 g/dL (ref 3.5–5.0)
Alkaline Phosphatase: 88 U/L (ref 38–126)
Anion gap: 8 (ref 5–15)
BUN: 10 mg/dL (ref 6–20)
CO2: 28 mmol/L (ref 22–32)
Calcium: 9.4 mg/dL (ref 8.9–10.3)
Chloride: 103 mmol/L (ref 98–111)
Creatinine, Ser: 0.98 mg/dL (ref 0.44–1.00)
GFR calc Af Amer: >60 mL/min (ref >60)
GFR calc non Af Amer: >60 mL/min (ref >60)
Glucose, Bld: 107 mg/dL — ABNORMAL HIGH (ref 70–99)
Potassium: 3.6 mmol/L (ref 3.5–5.1)
Sodium: 139 mmol/L (ref 135–145)
Total Bilirubin: 0.5 mg/dL (ref 0.3–1.2)
Total Protein: 7.2 g/dL (ref 6.5–8.1)

## 2019-04-30 LAB — TYPE AND SCREEN
ABO/RH(D): O POS
Antibody Screen: NEGATIVE

## 2019-04-30 LAB — SURGICAL PCR SCREEN
MRSA, PCR: NEGATIVE
Staphylococcus aureus: NEGATIVE

## 2019-04-30 LAB — ABO/RH: ABO/RH(D): O POS

## 2019-04-30 LAB — GLUCOSE, CAPILLARY: Glucose-Capillary: 117 mg/dL — ABNORMAL HIGH (ref 70–99)

## 2019-04-30 LAB — APTT: aPTT: 38 seconds — ABNORMAL HIGH (ref 24–36)

## 2019-04-30 NOTE — Progress Notes (Addendum)
PCP - [facility]  Endocrinologist Dr. Cruzita Lederer  Cardiologist - saw Dr Jenkins Rouge in 2017 for chest pain-   Cardiac cause of chest pain was ruled out./  Mrs Mayotte said that she was treated for GERD and she has never had it again.  Chest x-ray - na  EKG - 04/30/2019  Stress Test - no  ECHO - no  Cardiac Cath - no  Sleep Study - ? 2017 - did not find resuts- in [facility] notes, saw note for Home Sleep Study, Mrs. Mayotte reports that she was not called with results or to schedule visit for  CPAP.  CPAP -no  LABS-CBC with diff, CMP, PT, PTT, T/S, Hemoglobin A1C, UA, PCR.  ASA-NA  ERAS-yes, clears until 1230  HA1C -6.5 Fasting Blood Sugar - 80's to 100 Checks Blood Sugar ___1__ times a day  Anesthesia-  Pt denies having chest pain, sob, or fever at this time. All instructions explained to the pt, with a verbal understanding of the material. Pt agrees to go over the instructions while at home for a better understanding. Pt also instructed to self quarantine after being tested for COVID-19. The opportunity to ask questions was provided.

## 2019-04-30 NOTE — Progress Notes (Signed)
`     Your procedure is scheduled on Thursday May 07, 2019.  Report to [facility] Main Entrance "A" at 12:30 P.M., and check in at the Admitting office.              Your surgery or procedure is scheduled for 3:30 PM  Call this number if you have problems the morning of surgery:  [phone] - this is the pre -op  desk  Call [phone] if you have any questions prior to your surgery date Monday-Friday 8am-4pm    ( PAT Desk- ask to speak to a nurse)   Remember:  Do not eat after midnight the night before your surgery  You may drink clear liquids until 12:30 P.M day of your surgery.   Clear liquids allowed are: Water, Non-Citrus Juices (without pulp), Carbonated Beverages, Clear Tea, Black Coffee Only, and Gatorade  Please complete your PRE-SURGERY 10 oz WATER that was provided to you by 12:30 P.M day of surgery.                    Take medications before 12:30 PM and nothing else to drink after 12:30 PM  Take these medicines the morning of surgery with A SIP OF WATER:  ezetimibe (ZETIA)  rosuvastatin (CRESTOR)   traMADol (ULTRAM) - as needed         cyclobenzaprine (FLEXERIL) - as needed   7 days prior to surgery STOP taking any Aspirin (unless otherwise instructed by your surgeon), Aleve, Naproxen, Ibuprofen, Motrin, Advil, Goody's, BC's, all herbal medications, fish oil, and all vitamins.   WHAT DO I DO ABOUT MY DIABETES MEDICATION?  . THE NIGHT BEFORE SURGERY, metFORMIN (GLUCOPHAGE-XR) as usual     . THE MORNING OF SURGERY, do not take any ORAL diabetic medication - metFORMIN (GLUCOPHAGE-XR)   HOW TO MANAGE YOUR DIABETES BEFORE AND AFTER SURGERY  Why is it important to control my blood sugar before and after surgery? . Improving blood sugar levels before and after surgery helps healing and can limit problems. . A way of improving blood sugar control is eating a healthy diet by: o  Eating less sugar and carbohydrates o  Increasing activity/exercise o  Talking with  your doctor about reaching your blood sugar goals . High blood sugars (greater than 180 mg/dL) can raise your risk of infections and slow your recovery, so you will need to focus on controlling your diabetes during the weeks before surgery. . Make sure that the doctor who takes care of your diabetes knows about your planned surgery including the date and location.  How do I manage my blood sugar before surgery? . Check your blood sugar at least 4 times a day, starting 2 days before surgery, to make sure that the level is not too high or low. . Check your blood sugar the morning of your surgery when you wake up and every 2 hours until you get to the Short Stay unit. o If your blood sugar is less than 70 mg/dL, you will need to treat for low blood sugar: - Do not take insulin. - Treat a low blood sugar (less than 70 mg/dL) with  cup of clear juice (cranberry or apple), 4 glucose tablets, OR glucose gel. - Recheck blood sugar in 15 minutes after treatment (to make sure it is greater than 70 mg/dL). If your blood sugar is not greater than 70 mg/dL on recheck, call [phone] for further instructions. . Report your blood sugar to the short  stay nurse when you get to Short Stay.  . If you are admitted to the hospital after surgery: o Your blood sugar will be checked by the staff and you will probably be given insulin after surgery (instead of oral diabetes medicines) to make sure you have good blood sugar levels. o The goal for blood sugar control after surgery is 80-180 mg/dL.   Special instructions:   [facility]- Preparing For Surgery Do not shave 48 hours prior to surgery.  Before surgery, you can play an important role. Because skin is not sterile, your skin needs to be as free of germs as possible. You can reduce the number of germs on your skin by washing with CHG (chlorahexidine gluconate) Soap before surgery.  CHG is an antiseptic cleaner which kills germs and bonds with the skin to  continue killing germs even after washing.    Oral Hygiene is also important to reduce your risk of infection.  Remember - BRUSH YOUR TEETH THE MORNING OF SURGERY WITH YOUR REGULAR TOOTHPASTE  Please do not use if you have an allergy to CHG or antibacterial soaps. If your skin becomes reddened/irritated stop using the CHG.  Do not shave (including legs and underarms) for at least 48 hours prior to first CHG shower. It is OK to shave your face.  Please follow these instructions carefully.   1. Shower the NIGHT BEFORE SURGERY and the MORNING OF SURGERY with CHG Soap.   2. If you chose to wash your hair, wash your hair first as usual with your normal shampoo.  3. After you shampoo,  wash your face and private area with the soap you use at home, then rinse your hair and body thoroughly to remove the shampoo and soap.  4. Use CHG as you would any other liquid soap. You can apply CHG directly to the skin and wash gently with a scrungie or a clean washcloth.   5. Apply the CHG Soap to your body ONLY FROM THE NECK DOWN.  Do not use on open wounds or open sores. Avoid contact with your eyes, ears, mouth and genitals (private parts).   6. Wash thoroughly, paying special attention to the area where your surgery will be performed.  7. Thoroughly rinse your body with warm water from the neck down.  8. DO NOT shower/wash with your normal soap after using and rinsing off the CHG Soap.  9. Pat yourself dry with a CLEAN TOWEL.  10. Wear CLEAN PAJAMAS to bed the night before surgery, wear comfortable clothes the morning of surgery  11. Place CLEAN SHEETS on your bed the night of your first shower and DO NOT SLEEP WITH PETS.  Day of Surgery: Shower as instructed above. Do not apply any deodorants/lotions. Please wear clean clothes to the hospital/surgery center.   Remember to brush your teeth WITH YOUR REGULAR TOOTHPASTE.   Do not wear jewelry, make-up or nail polish.     Do not bring valuables  to the hospital.   [facility] is not responsible for any belongings or valuables.  If you wear a CPAP at night please bring your mask the morning of surgery   Please bring cases for contacts, glasses, hearing aids, because it cannot be worn into surgery.   Leave your suitcase in the car.  After surgery it may be brought to your room.  For patients admitted to the hospital, discharge time will be determined by your treatment team.  Patients discharged the day of surgery will  not be allowed to drive home.   Please read over the following fact sheets that you were given: Pain Booklet, Patient Instructions for Mupirocin Application, Coughing and Deep Breathing, Surgical Site Infections.

## 2019-05-04 ENCOUNTER — Other Ambulatory Visit (HOSPITAL_COMMUNITY)
Admission: RE | Admit: 2019-05-04 | Discharge: 2019-05-04 | Disposition: A | Discharge status: Home / Self Care | Payer: No Typology Code available for payment source | Source: Ambulatory Visit | Attending: Orthopedic Surgery | Admitting: Orthopedic Surgery

## 2019-05-04 DIAGNOSIS — Z01812 Encounter for preprocedural laboratory examination: Secondary | ICD-10-CM | POA: Insufficient documentation

## 2019-05-04 DIAGNOSIS — Z20822 Contact with and (suspected) exposure to covid-19: Secondary | ICD-10-CM | POA: Diagnosis not present

## 2019-05-04 LAB — SARS CORONAVIRUS 2 (TAT 6-24 HRS): SARS Coronavirus 2: NEGATIVE

## 2019-05-07 ENCOUNTER — Observation Stay (HOSPITAL_COMMUNITY)
Admission: RE | Admit: 2019-05-07 | Discharge: 2019-05-08 | Disposition: A | Discharge status: Home / Self Care | Payer: No Typology Code available for payment source | Attending: Orthopedic Surgery | Admitting: Orthopedic Surgery

## 2019-05-07 ENCOUNTER — Ambulatory Visit (HOSPITAL_COMMUNITY): Payer: No Typology Code available for payment source | Admitting: Certified Registered Nurse Anesthetist

## 2019-05-07 ENCOUNTER — Encounter (HOSPITAL_COMMUNITY)
Admission: RE | Disposition: A | Discharge status: Home / Self Care | Payer: Self-pay | Source: Home / Self Care | Attending: Orthopedic Surgery

## 2019-05-07 ENCOUNTER — Ambulatory Visit (HOSPITAL_COMMUNITY): Payer: No Typology Code available for payment source

## 2019-05-07 ENCOUNTER — Other Ambulatory Visit: Payer: Self-pay

## 2019-05-07 ENCOUNTER — Encounter (HOSPITAL_COMMUNITY): Payer: Self-pay | Admitting: Orthopedic Surgery

## 2019-05-07 DIAGNOSIS — Z419 Encounter for procedure for purposes other than remedying health state, unspecified: Secondary | ICD-10-CM

## 2019-05-07 DIAGNOSIS — M5410 Radiculopathy, site unspecified: Secondary | ICD-10-CM | POA: Diagnosis present

## 2019-05-07 DIAGNOSIS — Z7984 Long term (current) use of oral hypoglycemic drugs: Secondary | ICD-10-CM | POA: Diagnosis not present

## 2019-05-07 DIAGNOSIS — M4802 Spinal stenosis, cervical region: Secondary | ICD-10-CM | POA: Diagnosis not present

## 2019-05-07 DIAGNOSIS — E785 Hyperlipidemia, unspecified: Secondary | ICD-10-CM | POA: Insufficient documentation

## 2019-05-07 DIAGNOSIS — M5412 Radiculopathy, cervical region: Secondary | ICD-10-CM | POA: Insufficient documentation

## 2019-05-07 DIAGNOSIS — Z87891 Personal history of nicotine dependence: Secondary | ICD-10-CM | POA: Diagnosis not present

## 2019-05-07 DIAGNOSIS — Z79890 Hormone replacement therapy (postmenopausal): Secondary | ICD-10-CM | POA: Insufficient documentation

## 2019-05-07 DIAGNOSIS — Z79899 Other long term (current) drug therapy: Secondary | ICD-10-CM | POA: Diagnosis not present

## 2019-05-07 DIAGNOSIS — I10 Essential (primary) hypertension: Secondary | ICD-10-CM | POA: Insufficient documentation

## 2019-05-07 DIAGNOSIS — M5416 Radiculopathy, lumbar region: Secondary | ICD-10-CM | POA: Diagnosis present

## 2019-05-07 DIAGNOSIS — E119 Type 2 diabetes mellitus without complications: Secondary | ICD-10-CM | POA: Insufficient documentation

## 2019-05-07 HISTORY — PX: ANTERIOR CERVICAL DECOMP/DISCECTOMY FUSION: SHX1161

## 2019-05-07 LAB — GLUCOSE, CAPILLARY
Glucose-Capillary: 108 mg/dL — ABNORMAL HIGH (ref 70–99)
Glucose-Capillary: 87 mg/dL (ref 70–99)
Glucose-Capillary: 126 mg/dL — ABNORMAL HIGH (ref 70–99)
Glucose-Capillary: 136 mg/dL — ABNORMAL HIGH (ref 70–99)

## 2019-05-07 SURGERY — ANTERIOR CERVICAL DECOMPRESSION/DISCECTOMY FUSION 2 LEVELS
Anesthesia: General

## 2019-05-07 MED ORDER — SENNOSIDES-DOCUSATE SODIUM 8.6-50 MG PO TABS: 1.0000 | ORAL_TABLET | Freq: Every evening | ORAL | Status: DC | PRN

## 2019-05-07 MED ORDER — PANTOPRAZOLE SODIUM 40 MG PO TBEC
40.0000 mg | DELAYED_RELEASE_TABLET | Freq: Every day | ORAL | Status: DC
  Administered 2019-05-07: 40 mg via ORAL
  Filled 2019-05-07: qty 1

## 2019-05-07 MED ORDER — DIPHENHYDRAMINE HCL 50 MG/ML IJ SOLN
INTRAMUSCULAR | Status: DC | PRN
  Administered 2019-05-07: 12.5 mg via INTRAVENOUS

## 2019-05-07 MED ORDER — ONDANSETRON HCL 4 MG/2ML IJ SOLN
INTRAMUSCULAR | Status: AC
  Filled 2019-05-07: qty 2

## 2019-05-07 MED ORDER — ACETAMINOPHEN 325 MG PO TABS: 650.0000 mg | ORAL_TABLET | ORAL | Status: DC | PRN

## 2019-05-07 MED ORDER — CEFAZOLIN SODIUM-DEXTROSE 2-4 GM/100ML-% IV SOLN
INTRAVENOUS | Status: AC
  Filled 2019-05-07: qty 100

## 2019-05-07 MED ORDER — INSULIN ASPART 100 UNIT/ML ~~LOC~~ SOLN: 0.0000 [IU] | Freq: Every day | SUBCUTANEOUS | Status: DC

## 2019-05-07 MED ORDER — PROMETHAZINE HCL 25 MG/ML IJ SOLN: 6.2500 mg | INTRAMUSCULAR | Status: DC | PRN

## 2019-05-07 MED ORDER — MIDAZOLAM HCL 2 MG/2ML IJ SOLN
INTRAMUSCULAR | Status: AC
  Filled 2019-05-07: qty 2

## 2019-05-07 MED ORDER — PROPOFOL 10 MG/ML IV BOLUS
INTRAVENOUS | Status: DC | PRN
  Administered 2019-05-07: 150 mg via INTRAVENOUS

## 2019-05-07 MED ORDER — ESTRADIOL 0.1 MG/24HR TD PTWK: 0.1000 mg | MEDICATED_PATCH | TRANSDERMAL | Status: DC

## 2019-05-07 MED ORDER — ROSUVASTATIN CALCIUM 5 MG PO TABS
5.0000 mg | ORAL_TABLET | Freq: Every day | ORAL | Status: DC
  Administered 2019-05-07: 5 mg via ORAL
  Filled 2019-05-07: qty 1

## 2019-05-07 MED ORDER — PROPOFOL 10 MG/ML IV BOLUS
INTRAVENOUS | Status: AC
  Filled 2019-05-07: qty 20

## 2019-05-07 MED ORDER — BUPIVACAINE HCL (PF) 0.25 % IJ SOLN
INTRAMUSCULAR | Status: DC | PRN
  Administered 2019-05-07: 30 mL

## 2019-05-07 MED ORDER — PHENYLEPHRINE HCL-NACL 10-0.9 MG/250ML-% IV SOLN
INTRAVENOUS | Status: DC | PRN
  Administered 2019-05-07: 20 ug/min via INTRAVENOUS

## 2019-05-07 MED ORDER — BISACODYL 5 MG PO TBEC: 5.0000 mg | DELAYED_RELEASE_TABLET | Freq: Every day | ORAL | Status: DC | PRN

## 2019-05-07 MED ORDER — PHENYLEPHRINE HCL (PRESSORS) 10 MG/ML IV SOLN
INTRAVENOUS | Status: DC | PRN
  Administered 2019-05-07: 80 ug via INTRAVENOUS

## 2019-05-07 MED ORDER — DOCUSATE SODIUM 100 MG PO CAPS
100.0000 mg | ORAL_CAPSULE | Freq: Two times a day (BID) | ORAL | Status: DC
  Administered 2019-05-07: 100 mg via ORAL
  Filled 2019-05-07: qty 1

## 2019-05-07 MED ORDER — CEFAZOLIN SODIUM-DEXTROSE 2-4 GM/100ML-% IV SOLN
2.0000 g | INTRAVENOUS | Status: AC
  Administered 2019-05-07: 2 g via INTRAVENOUS

## 2019-05-07 MED ORDER — HYDROMORPHONE HCL 1 MG/ML IJ SOLN
0.2500 mg | INTRAMUSCULAR | Status: DC | PRN
  Administered 2019-05-07 (×2): 0.5 mg via INTRAVENOUS

## 2019-05-07 MED ORDER — HYDROMORPHONE HCL 1 MG/ML IJ SOLN
INTRAMUSCULAR | Status: AC
  Filled 2019-05-07: qty 1

## 2019-05-07 MED ORDER — 0.9 % SODIUM CHLORIDE (POUR BTL) OPTIME
TOPICAL | Status: DC | PRN
  Administered 2019-05-07: 1000 mL

## 2019-05-07 MED ORDER — LIDOCAINE 2% (20 MG/ML) 5 ML SYRINGE
INTRAMUSCULAR | Status: DC | PRN
  Administered 2019-05-07: 100 mg via INTRAVENOUS

## 2019-05-07 MED ORDER — MEPERIDINE HCL 25 MG/ML IJ SOLN: 6.2500 mg | INTRAMUSCULAR | Status: DC | PRN

## 2019-05-07 MED ORDER — SODIUM CHLORIDE 0.9% FLUSH
3.0000 mL | Freq: Two times a day (BID) | INTRAVENOUS | Status: DC
  Administered 2019-05-07: 3 mL via INTRAVENOUS

## 2019-05-07 MED ORDER — POVIDONE-IODINE 7.5 % EX SOLN
Freq: Once | CUTANEOUS | Status: DC
  Filled 2019-05-07: qty 118

## 2019-05-07 MED ORDER — FENTANYL CITRATE (PF) 250 MCG/5ML IJ SOLN
INTRAMUSCULAR | Status: AC
  Filled 2019-05-07: qty 5

## 2019-05-07 MED ORDER — ROCURONIUM BROMIDE 50 MG/5ML IV SOSY
PREFILLED_SYRINGE | INTRAVENOUS | Status: DC | PRN
  Administered 2019-05-07: 10 mg via INTRAVENOUS
  Administered 2019-05-07: 70 mg via INTRAVENOUS

## 2019-05-07 MED ORDER — EZETIMIBE 10 MG PO TABS
10.0000 mg | ORAL_TABLET | Freq: Every day | ORAL | Status: DC
  Administered 2019-05-07: 10 mg via ORAL
  Filled 2019-05-07 (×2): qty 1

## 2019-05-07 MED ORDER — THROMBIN 20000 UNITS EX SOLR
CUTANEOUS | Status: DC | PRN
  Administered 2019-05-07: 20000 [IU] via TOPICAL

## 2019-05-07 MED ORDER — LACTATED RINGERS IV SOLN: INTRAVENOUS | Status: DC

## 2019-05-07 MED ORDER — ACETAMINOPHEN 650 MG RE SUPP: 650.0000 mg | RECTAL | Status: DC | PRN

## 2019-05-07 MED ORDER — SODIUM CHLORIDE 0.9% FLUSH: 3.0000 mL | INTRAVENOUS | Status: DC | PRN

## 2019-05-07 MED ORDER — INSULIN ASPART 100 UNIT/ML ~~LOC~~ SOLN
0.0000 [IU] | Freq: Three times a day (TID) | SUBCUTANEOUS | Status: DC
  Administered 2019-05-08: 2 [IU] via SUBCUTANEOUS

## 2019-05-07 MED ORDER — MIDAZOLAM HCL 5 MG/5ML IJ SOLN
INTRAMUSCULAR | Status: DC | PRN
  Administered 2019-05-07: 2 mg via INTRAVENOUS

## 2019-05-07 MED ORDER — ONDANSETRON HCL 4 MG/2ML IJ SOLN
INTRAMUSCULAR | Status: DC | PRN
  Administered 2019-05-07: 4 mg via INTRAVENOUS

## 2019-05-07 MED ORDER — ZOLPIDEM TARTRATE 5 MG PO TABS: 5.0000 mg | ORAL_TABLET | Freq: Every evening | ORAL | Status: DC | PRN

## 2019-05-07 MED ORDER — LACTATED RINGERS IV SOLN
INTRAVENOUS | Status: DC
  Administered 2019-05-07: 10 mL/h via INTRAVENOUS

## 2019-05-07 MED ORDER — EPINEPHRINE PF 1 MG/ML IJ SOLN
INTRAMUSCULAR | Status: DC | PRN
  Administered 2019-05-07: .15 mL

## 2019-05-07 MED ORDER — FENTANYL CITRATE (PF) 100 MCG/2ML IJ SOLN
INTRAMUSCULAR | Status: DC | PRN
  Administered 2019-05-07 (×5): 50 ug via INTRAVENOUS

## 2019-05-07 MED ORDER — ALUM & MAG HYDROXIDE-SIMETH 200-200-20 MG/5ML PO SUSP: 30.0000 mL | Freq: Four times a day (QID) | ORAL | Status: DC | PRN

## 2019-05-07 MED ORDER — ONDANSETRON HCL 4 MG PO TABS: 4.0000 mg | ORAL_TABLET | Freq: Four times a day (QID) | ORAL | Status: DC | PRN

## 2019-05-07 MED ORDER — VITAMIN D (ERGOCALCIFEROL) 1.25 MG (50000 UNIT) PO CAPS
50000.0000 [IU] | ORAL_CAPSULE | ORAL | Status: DC
  Filled 2019-05-07: qty 1

## 2019-05-07 MED ORDER — OXYCODONE-ACETAMINOPHEN 5-325 MG PO TABS
1.0000 | ORAL_TABLET | ORAL | Status: DC | PRN
  Administered 2019-05-07 – 2019-05-08 (×4): 2 via ORAL
  Filled 2019-05-07 (×4): qty 2

## 2019-05-07 MED ORDER — CYCLOBENZAPRINE HCL 10 MG PO TABS
10.0000 mg | ORAL_TABLET | Freq: Three times a day (TID) | ORAL | Status: DC | PRN
  Administered 2019-05-07 – 2019-05-08 (×2): 10 mg via ORAL
  Filled 2019-05-07 (×2): qty 1

## 2019-05-07 MED ORDER — BUPIVACAINE HCL (PF) 0.25 % IJ SOLN
INTRAMUSCULAR | Status: AC
  Filled 2019-05-07: qty 30

## 2019-05-07 MED ORDER — EPINEPHRINE PF 1 MG/ML IJ SOLN
INTRAMUSCULAR | Status: AC
  Filled 2019-05-07: qty 1

## 2019-05-07 MED ORDER — SODIUM CHLORIDE 0.9 % IV SOLN: 250.0000 mL | INTRAVENOUS | Status: DC

## 2019-05-07 MED ORDER — DEXAMETHASONE SODIUM PHOSPHATE 10 MG/ML IJ SOLN
INTRAMUSCULAR | Status: DC | PRN
  Administered 2019-05-07: 4 mg via INTRAVENOUS

## 2019-05-07 MED ORDER — SUGAMMADEX SODIUM 200 MG/2ML IV SOLN
INTRAVENOUS | Status: DC | PRN
  Administered 2019-05-07: 200 mg via INTRAVENOUS

## 2019-05-07 MED ORDER — PHENOL 1.4 % MT LIQD
1.0000 | OROMUCOSAL | Status: DC | PRN
  Filled 2019-05-07: qty 177

## 2019-05-07 MED ORDER — CEFAZOLIN SODIUM-DEXTROSE 2-4 GM/100ML-% IV SOLN
2.0000 g | Freq: Three times a day (TID) | INTRAVENOUS | Status: AC
  Administered 2019-05-07 – 2019-05-08 (×2): 2 g via INTRAVENOUS
  Filled 2019-05-07 (×2): qty 100

## 2019-05-07 MED ORDER — PANTOPRAZOLE SODIUM 40 MG IV SOLR: 40.0000 mg | Freq: Every day | INTRAVENOUS | Status: DC

## 2019-05-07 MED ORDER — PHENYLEPHRINE 40 MCG/ML (10ML) SYRINGE FOR IV PUSH (FOR BLOOD PRESSURE SUPPORT)
PREFILLED_SYRINGE | INTRAVENOUS | Status: AC
  Filled 2019-05-07: qty 10

## 2019-05-07 MED ORDER — ONDANSETRON HCL 4 MG/2ML IJ SOLN: 4.0000 mg | Freq: Four times a day (QID) | INTRAMUSCULAR | Status: DC | PRN

## 2019-05-07 MED ORDER — METFORMIN HCL ER 500 MG PO TB24
500.0000 mg | ORAL_TABLET | Freq: Every day | ORAL | Status: DC
  Administered 2019-05-08: 500 mg via ORAL
  Filled 2019-05-07: qty 1

## 2019-05-07 MED ORDER — MENTHOL 3 MG MT LOZG: 1.0000 | LOZENGE | OROMUCOSAL | Status: DC | PRN

## 2019-05-07 MED ORDER — FLEET ENEMA 7-19 GM/118ML RE ENEM: 1.0000 | ENEMA | Freq: Once | RECTAL | Status: DC | PRN

## 2019-05-07 MED ORDER — THROMBIN (RECOMBINANT) 20000 UNITS EX SOLR
CUTANEOUS | Status: AC
  Filled 2019-05-07: qty 20000

## 2019-05-07 SURGICAL SUPPLY — 81 items
AGENT HMST KT MTR STRL THRMB (HEMOSTASIS)
APL SKNCLS STERI-STRIP NONHPOA (GAUZE/BANDAGES/DRESSINGS) ×1
BENZOIN TINCTURE PRP APPL 2/3 (GAUZE/BANDAGES/DRESSINGS) ×2 IMPLANT
BIT DRILL NEURO 2X3.1 SFT TUCH (MISCELLANEOUS) ×1 IMPLANT
BIT DRILL SKYLINE 12MM (BIT) IMPLANT
BLADE CLIPPER SURG (BLADE) ×1 IMPLANT
BLADE SURG 15 STRL LF DISP TIS (BLADE) ×1 IMPLANT
BLADE SURG 15 STRL SS (BLADE) ×2
BONE VIVIGEN FORMABLE 1.3CC (Bone Implant) ×4 IMPLANT
BUR MATCHSTICK NEURO 3.0 LAGG (BURR) ×1 IMPLANT
CARTRIDGE OIL MAESTRO DRILL (MISCELLANEOUS) ×1 IMPLANT
CLSR STERI-STRIP ANTIMIC 1/2X4 (GAUZE/BANDAGES/DRESSINGS) ×1 IMPLANT
COLLAR CERV LO CONTOUR FIRM DE (SOFTGOODS) IMPLANT
CORD BIPOLAR FORCEPS 12FT (ELECTRODE) ×2 IMPLANT
COVER SURGICAL LIGHT HANDLE (MISCELLANEOUS) ×2 IMPLANT
COVER WAND RF STERILE (DRAPES) ×1 IMPLANT
DECANTER SPIKE VIAL GLASS SM (MISCELLANEOUS) ×2 IMPLANT
DEVICE ENDSKLTN CRVCL 5MM-0SM (Orthopedic Implant) IMPLANT
DIFFUSER DRILL AIR PNEUMATIC (MISCELLANEOUS) ×2 IMPLANT
DRAIN JACKSON RD 7FR 3/32 (WOUND CARE) IMPLANT
DRAPE C-ARM 42X72 X-RAY (DRAPES) ×2 IMPLANT
DRAPE POUCH INSTRU U-SHP 10X18 (DRAPES) ×2 IMPLANT
DRAPE SURG 17X23 STRL (DRAPES) ×8 IMPLANT
DRILL BIT SKYLINE 12MM (BIT) ×2
DRILL NEURO 2X3.1 SOFT TOUCH (MISCELLANEOUS) ×2
DURAPREP 26ML APPLICATOR (WOUND CARE) ×2 IMPLANT
ELECT COATED BLADE 2.86 ST (ELECTRODE) ×2 IMPLANT
ELECT REM PT RETURN 9FT ADLT (ELECTROSURGICAL) ×2
ELECTRODE REM PT RTRN 9FT ADLT (ELECTROSURGICAL) ×1 IMPLANT
ENDOSKELETON CERVICAL 5MM-0SM (Orthopedic Implant) ×2 IMPLANT
EVACUATOR SILICONE 100CC (DRAIN) IMPLANT
GAUZE 4X4 16PLY RFD (DISPOSABLE) ×1 IMPLANT
GAUZE SPONGE 4X4 12PLY STRL (GAUZE/BANDAGES/DRESSINGS) ×2 IMPLANT
GLOVE BIO SURGEON STRL SZ7 (GLOVE) ×2 IMPLANT
GLOVE BIO SURGEON STRL SZ8 (GLOVE) ×2 IMPLANT
GLOVE BIOGEL PI IND STRL 7.0 (GLOVE) ×2 IMPLANT
GLOVE BIOGEL PI IND STRL 8 (GLOVE) ×1 IMPLANT
GLOVE BIOGEL PI INDICATOR 7.0 (GLOVE) ×2
GLOVE BIOGEL PI INDICATOR 8 (GLOVE) ×1
GOWN STRL REUS W/ TWL LRG LVL3 (GOWN DISPOSABLE) ×1 IMPLANT
GOWN STRL REUS W/ TWL XL LVL3 (GOWN DISPOSABLE) ×1 IMPLANT
GOWN STRL REUS W/TWL LRG LVL3 (GOWN DISPOSABLE) ×2
GOWN STRL REUS W/TWL XL LVL3 (GOWN DISPOSABLE) ×2
GRAFT BNE MATRIX VG FRMBL SM 1 (Bone Implant) IMPLANT
INTERLOCK LRDTC CRVCL VBR 6MM (Bone Implant) IMPLANT
IV CATH 14GX2 1/4 (CATHETERS) ×2 IMPLANT
KIT BASIN OR (CUSTOM PROCEDURE TRAY) ×2 IMPLANT
KIT TURNOVER KIT B (KITS) ×2 IMPLANT
LORDOTIC CERVICAL VBR 6MM SM (Bone Implant) ×2 IMPLANT
MANIFOLD NEPTUNE II (INSTRUMENTS) ×1 IMPLANT
NDL PRECISIONGLIDE 27X1.5 (NEEDLE) ×1 IMPLANT
NDL SPNL 20GX3.5 QUINCKE YW (NEEDLE) ×1 IMPLANT
NEEDLE PRECISIONGLIDE 27X1.5 (NEEDLE) ×2 IMPLANT
NEEDLE SPNL 20GX3.5 QUINCKE YW (NEEDLE) ×2 IMPLANT
NS IRRIG 1000ML POUR BTL (IV SOLUTION) ×2 IMPLANT
OIL CARTRIDGE MAESTRO DRILL (MISCELLANEOUS) ×2
PACK ORTHO CERVICAL (CUSTOM PROCEDURE TRAY) ×2 IMPLANT
PAD ARMBOARD 7.5X6 YLW CONV (MISCELLANEOUS) ×4 IMPLANT
PATTIES SURGICAL .5 X.5 (GAUZE/BANDAGES/DRESSINGS) ×1 IMPLANT
PATTIES SURGICAL .5 X1 (DISPOSABLE) ×1 IMPLANT
PIN DISTRACTION 14 (PIN) ×2 IMPLANT
PLATE SKYLINE 2LVL 26MM (Plate) ×1 IMPLANT
POSITIONER HEAD DONUT 9IN (MISCELLANEOUS) ×2 IMPLANT
SCREW SKYLINE VARIABLE LG (Screw) ×4 IMPLANT
SCREW VARIABLE SELF TAP 12MM (Screw) ×2 IMPLANT
SPONGE INTESTINAL PEANUT (DISPOSABLE) ×2 IMPLANT
SPONGE SURGIFOAM ABS GEL 100 (HEMOSTASIS) ×2 IMPLANT
STRIP CLOSURE SKIN 1/2X4 (GAUZE/BANDAGES/DRESSINGS) ×1 IMPLANT
SURGIFLO W/THROMBIN 8M KIT (HEMOSTASIS) IMPLANT
SUT MNCRL AB 4-0 PS2 18 (SUTURE) ×2 IMPLANT
SUT SILK 4 0 (SUTURE)
SUT SILK 4-0 18XBRD TIE 12 (SUTURE) IMPLANT
SUT VIC AB 2-0 CT2 18 VCP726D (SUTURE) ×3 IMPLANT
SYR BULB IRRIGATION 50ML (SYRINGE) ×2 IMPLANT
SYR CONTROL 10ML LL (SYRINGE) ×6 IMPLANT
TAPE CLOTH 4X10 WHT NS (GAUZE/BANDAGES/DRESSINGS) ×2 IMPLANT
TAPE UMBILICAL COTTON 1/8X30 (MISCELLANEOUS) ×2 IMPLANT
TOWEL GREEN STERILE (TOWEL DISPOSABLE) ×2 IMPLANT
TOWEL GREEN STERILE FF (TOWEL DISPOSABLE) ×2 IMPLANT
WATER STERILE IRR 1000ML POUR (IV SOLUTION) ×1 IMPLANT
YANKAUER SUCT BULB TIP NO VENT (SUCTIONS) ×2 IMPLANT

## 2019-05-07 NOTE — Anesthesia Procedure Notes (Signed)
Procedure Name: Intubation Date/Time: 05/07/2019 12:24 PM Performed by: Inda Coke, CRNA Pre-anesthesia Checklist: Patient identified, Emergency Drugs available, Suction available and Patient being monitored Patient Re-evaluated:Patient Re-evaluated prior to induction Oxygen Delivery Method: Circle System Utilized Preoxygenation: Pre-oxygenation with 100% oxygen Induction Type: IV induction Ventilation: Mask ventilation without difficulty Laryngoscope Size: Glidescope and 3 Grade View: Grade I Tube type: Oral Tube size: 7.0 mm Number of attempts: 1 Airway Equipment and Method: Stylet,  Oral airway and Rigid stylet Placement Confirmation: ETT inserted through vocal cords under direct vision,  positive ETCO2 and breath sounds checked- equal and bilateral Secured at: 22 cm Tube secured with: Tape Dental Injury: Teeth and Oropharynx as per pre-operative assessment

## 2019-05-07 NOTE — H&P (Signed)
PREOPERATIVE H&P  Chief Complaint: Left arm pain  HPI: [name] is a 59 y.o. female who presents with ongoing pain in the left arm  MRI reveals stenosis on the left at C5/6 and C6/7, correlating to the patient's arm pain  Patient has failed multiple forms of conservative care and continues to have pain (see office notes for additional details regarding the patient's full course of treatment)  Past Medical History:  Diagnosis Date  . Anxiety   . 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 Brain tumor (benign) ([place]) 2018  . Chest pain 05/15/2015  . Complication of anesthesia   . Depression    in the past  . Diabetes mellitus without complication ([place])    Type II  . GERD (gastroesophageal reflux disease)    04/30/2019- "in the past"  . H/O varicella   . History of measles, mumps, or rubella   . HSV-2 (herpes simplex virus 2) infection   . Hx of ovarian cyst   . Hyperlipidemia   . Hypersomnia 07/19/2015  . Hypertension    "sometimes"   . Migraine headache    "due to cervical issuses"  . Plantar fasciitis   . PONV (postoperative nausea and vomiting)   . Scapular dyskinesis 10/03/2016  . Sleep apnea    pt denies  . Sleep deprivation    loss of sleep  . 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 ABDOMINAL HYSTERECTOMY    . CHOLECYSTECTOMY  11/13/10  . COLONOSCOPY  Multiple  . DILATION AND CURETTAGE OF UTERUS    . ESOPHAGOGASTRODUODENOSCOPY    . OOPHORECTOMY  2009   bilateral salpingo-oophorectomy.  . TUBAL LIGATION     Social History   Socioeconomic History  . Marital status: Married    Spouse name: [name]  . Number of children: 2  . Years of education: 1  . Highest education level: Associate degree: academic program  Occupational History    Employer: [employer]  Tobacco Use  . Smoking status: Former Smoker    Packs/day: 0.50    Years: 30.00    Pack years: 15.00    Types: Cigarettes    Quit date: 2012    Years since quitting: 9.1  . Smokeless tobacco: Never Used  Substance and Sexual Activity  . Alcohol use: No    Alcohol/week: 0.0 standard drinks  . Drug use: No  . Sexual activity: Yes    Partners: Male    Birth control/protection: Surgical    Comment: Hysterectomy  Other Topics Concern  . Not on file  Social History Narrative   Patient lives at home with her husband [name]). 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 Two children. Both boys      Patient is right-handed. She lives in a one level home. She drinks coffee occasionally. She does not regularly exercise.   Social Determinants of Health   Financial Resource Strain:   . Difficulty of Paying Living Expenses: Not on file  Food Insecurity:   . Worried About Charity fundraiser in the Last Year: Not on file  . Ran Out of Food in the Last Year: Not on file  Transportation Needs:   . Lack of Transportation (Medical): Not on file  . Lack of  Transportation (Non-Medical): Not on file  Physical Activity:   . Days of Exercise per Week: Not on file  . Minutes of Exercise per Session: Not on file  Stress:   . Feeling of Stress : Not on file  Social Connections:   . Frequency of Communication with Friends and Family: Not on file  . Frequency of Social Gatherings with Friends and Family: Not on file  . Attends Religious Services: Not on file  . Active Member of Clubs or Organizations: Not on file  . Attends Archivist Meetings: Not on file  . Marital Status: Not on file   Family History  Problem Relation Age of Onset  . Hypertension Mother   . Hypotension Mother   . Anemia Mother        low iron  . Heart disease Sister   . Sickle cell trait Other   . Alcohol abuse Brother   . Alcohol abuse Brother   . Aneurysm Brother   . Drug abuse Brother   . Colon  cancer Neg Hx   . Esophageal cancer Neg Hx   . Stomach cancer Neg Hx   . Breast cancer Neg Hx    Allergies  Allergen Reactions  . Sulfa Antibiotics Anaphylaxis, Hives and Swelling   Prior to Admission medications   Medication Sig Start Date End Date Taking? Authorizing Provider  cyclobenzaprine (FLEXERIL) 10 MG tablet Take 1 tablet (10 mg total) by mouth 3 (three) times daily as needed for muscle spasms. 03/31/19  Yes Matilde Haymaker, MD  ezetimibe (ZETIA) 10 MG tablet Take 1 tablet (10 mg total) by mouth daily. 01/02/18  Yes Philemon Kingdom, MD  metFORMIN (GLUCOPHAGE-XR) 500 MG 24 hr tablet TAKE 1 TABLET BY MOUTH DAILY WITH A MEAL. Patient taking differently: Take 500 mg by mouth daily with breakfast.  02/16/19  Yes Philemon Kingdom, MD  rosuvastatin (CRESTOR) 5 MG tablet Take 1 tablet (5 mg total) by mouth daily. 01/02/19  Yes Philemon Kingdom, MD  traMADol (ULTRAM) 50 MG tablet Take 50 mg by mouth every 6 (six) hours as needed for moderate pain.    Yes [provider]  Accu-Chek FastClix Lancets MISC USE AS DIRECTED TO CHECK BLOOD SUGAR ONCE DAILY 02/16/19   Philemon Kingdom, MD  ACCU-CHEK GUIDE test strip USE AS DIRECTED TO CHECK BLOOD SUGAR ONCE DAILY 02/16/19   Philemon Kingdom, MD  Elastic Bandages & Supports (WRIST BRACE/LEFT MEDIUM) MISC 1 each by Does not apply route as needed. 05/16/18   Guadalupe Dawn, MD  estradiol (VIVELLE-DOT) 0.075 MG/24HR Place 1 patch onto the skin 2 (two) times a week. 01/29/19   Nunzio Cobbs, MD  gabapentin (NEURONTIN) 100 MG capsule Take 1 capsule in AM and 2 capsules at bedtime for one week, then 2 capsules twice daily Patient not taking: Reported on 04/28/2019 12/09/18   Pieter Partridge, DO  meloxicam (MOBIC) 15 MG tablet Take 1 tablet (15 mg total) by mouth daily. Patient not taking: Reported on 04/28/2019 03/31/19   Matilde Haymaker, MD  phentermine 37.5 MG capsule Take 1 capsule (37.5 mg total) by mouth every morning. Patient not  taking: Reported on 04/28/2019 01/27/19   Nunzio Cobbs, MD  topiramate (TOPAMAX) 50 MG tablet Take 1 tablet (50 mg total) by mouth 2 (two) times daily. Patient not taking: Reported on 04/28/2019 12/09/18   Pieter Partridge, DO  Vitamin D, Ergocalciferol, (DRISDOL) 1.25 MG (50000 UT) CAPS capsule Take 1 capsule (50,000 Units total)  by mouth every 7 (seven) days. 07/04/18   Philemon Kingdom, MD     All other systems have been reviewed and were otherwise negative with the exception of those mentioned in the HPI and as above.  Physical Exam: Vitals:   05/07/19 0903  BP: 135/82  Pulse: 75  Resp: 18  Temp: 98.5 F (36.9 C)  SpO2: 97%    Body mass index is 33.12 kg/m.  General: Alert, no acute distress Cardiovascular: No pedal edema Respiratory: No cyanosis, no use of accessory musculature Skin: No lesions in the area of chief complaint Neurologic: Sensation intact distally Psychiatric: Patient is competent for consent with normal mood and affect Lymphatic: No axillary or cervical lymphadenopathy  MUSCULOSKELETAL: + spurling sign on the left  Assessment/Plan: LEFT-SIDED CERVICAL RADICULOPATHY Plan for Procedure(s): ANTERIOR CERVICAL DECOMPRESSION FUSION CERVICAL 5-6, CERVICAL 6-7 WITH INSTRUMENTATION AND ALLOGRAFT   Norva Karvonen, MD 05/07/2019 11:19 AM

## 2019-05-07 NOTE — Op Note (Signed)
PATIENT NAME: [name] [facility]   MEDICAL RECORD NO.:   [id number]    DATE OF BIRTH: 20-Aug-1960   DATE OF PROCEDURE: 05/07/2019                               OPERATIVE REPORT     PREOPERATIVE DIAGNOSES: 1. Left-sided cervical radiculopathy. 2. Spinal stenosis spanning C5-C7.   POSTOPERATIVE DIAGNOSES: 1. Left-sided cervical radiculopathy. 2. Spinal stenosis spanning C5-C7.   PROCEDURE: 1. Anterior cervical decompression and fusion C5/6, C6/7. 2. Placement of anterior instrumentation, C5-C7. 3. Insertion of interbody device x 2 (Titan intervertebral spacers). 4. Intraoperative use of fluoroscopy. 5. Use of morselized allograft - ViviGen.   SURGEON:  Phylliss Bob, MD   ASSISTANT:  Pricilla Holm, PA-C.   ANESTHESIA:  General endotracheal anesthesia.   COMPLICATIONS:  None.   DISPOSITION:  Stable.   ESTIMATED BLOOD LOSS:  Minimal.   INDICATIONS FOR SURGERY:  Briefly, [name] is a pleasant 59 y.o. year- old patient, who did present to me with severe pain in the neck and left arm.  The patient's MRI did reveal the findings noted above.  Given the patient's ongoing rather debilitating pain and lack of improvement with appropriate treatment measures, we did discuss proceeding with the procedure noted above.  The patient was fully aware of the risks and limitations of surgery as outlined in my preoperative note.   OPERATIVE DETAILS:  On 05/07/2019  the patient was brought to surgery and general endotracheal anesthesia was administered.  The patient was placed supine on the hospital bed. The neck was gently extended.  All bony prominences were meticulously padded.  The neck was prepped and draped in the usual sterile fashion.  At this point, I did make a left-sided transverse incision.  The platysma was incised.  A Smith-Robinson approach was used and the anterior spine was identified. A self-retaining retractor was placed.  I then subperiosteally exposed the  vertebral bodies from C5-C7.  Caspar pins were then placed into the C6 and C7 vertebral bodies and distraction was applied.  A thorough and complete C6-7 intervertebral diskectomy was performed.  The posterior longitudinal ligament was identified and entered using a nerve hook.  I then used #1 followed by #2 Kerrison to perform a thorough and complete intervertebral diskectomy.  The spinal canal was thoroughly decompressed, as was the right and left neuroforamen.  The endplates were then prepared and the appropriate-sized intervertebral spacer was then packed with ViviGen and tamped into position in the usual fashion.  The lower Caspar pin was then removed and placed into the C5 vertebral body and once again, distraction was applied across the C5-6 intervertebral space.  I then again performed a thorough and complete diskectomy, thoroughly decompressing the spinal canal and bilateral neuroforamena.  After preparing the endplates, the appropriate-sized intervertebral spacer was packed with ViviGen and tamped into position. The Caspar pins then were removed and bone wax was placed in their place.  The appropriate-sized anterior cervical plate was placed over the anterior spine.  12 mm variable angle screws were placed, 2 in each vertebral body from C5-C7 for a total of 6 vertebral body screws.  The screws were then locked to the plate using the Cam locking mechanism.  I was very pleased with the final fluoroscopic images.  The wound was then irrigated.  The wound was then explored for any undue bleeding and there was no bleeding noted. The wound was  then closed in layers using 2-0 Vicryl, followed by 4-0 Monocryl.  Benzoin and Steri-Strips were applied, followed by sterile dressing.  All instrument counts were correct at the termination of the procedure.   Of note, Pricilla Holm, PA-C, was my assistant throughout surgery, and did aid in retraction, suctioning, and closure from start to  finish.       Phylliss Bob, MD

## 2019-05-07 NOTE — Anesthesia Preprocedure Evaluation (Addendum)
Anesthesia Evaluation  Patient identified by MRN, date of birth, ID band Patient awake    Reviewed: Allergy & Precautions, NPO status , Patient's Chart, lab work & pertinent test results  History of Anesthesia Complications (+) PONV and history of anesthetic complications  Airway Mallampati: II  TM Distance: >3 FB Neck ROM: Full    Dental  (+) Upper Dentures, Missing, Dental Advisory Given   Pulmonary sleep apnea , former smoker,    Pulmonary exam normal breath sounds clear to auscultation       Cardiovascular hypertension, Normal cardiovascular exam Rhythm:Regular Rate:Normal     Neuro/Psych  Headaches,  Neuromuscular disease negative psych ROS   GI/Hepatic Neg liver ROS, GERD  ,  Endo/Other  diabetes  Renal/GU negative Renal ROS     Musculoskeletal  (+) Arthritis ,   Abdominal   Peds  Hematology negative hematology ROS (+)   Anesthesia Other Findings   Reproductive/Obstetrics negative OB ROS                            Anesthesia Physical Anesthesia Plan  ASA: III  Anesthesia Plan: General   Post-op Pain Management:    Induction: Intravenous  PONV Risk Score and Plan: 4 or greater and Ondansetron, Dexamethasone, Treatment may vary due to age or medical condition and Midazolam  Airway Management Planned: Oral ETT  Additional Equipment: None  Intra-op Plan:   Post-operative Plan: Extubation in OR  Informed Consent: I have reviewed the patients History and Physical, chart, labs and discussed the procedure including the risks, benefits and alternatives for the proposed anesthesia with the patient or authorized representative who has indicated his/her understanding and acceptance.     Dental advisory given  Plan Discussed with: CRNA  Anesthesia Plan Comments:         Anesthesia Quick Evaluation

## 2019-05-07 NOTE — Transfer of Care (Signed)
Immediate Anesthesia Transfer of Care Note  Patient: [name]  Procedure(s) Performed: ANTERIOR CERVICAL DECOMPRESSION FUSION CERVICAL 5-6, CERVICAL 6-7 WITH INSTRUMENTATION AND ALLOGRAFT (N/A )  Patient Location: PACU  Anesthesia Type:General  Level of Consciousness: awake and alert   Airway & Oxygen Therapy: Patient Spontanous Breathing and Patient connected to nasal cannula oxygen  Post-op Assessment: Report given to RN and Post -op Vital signs reviewed and stable  Post vital signs: Reviewed and stable  Last Vitals:  Vitals Value Taken Time  BP 154/85 05/07/19 1511  Temp    Pulse 95 05/07/19 1516  Resp 22 05/07/19 1516  SpO2 100 % 05/07/19 1516  Vitals shown include unvalidated device data.  Last Pain:  Vitals:   05/07/19 0915  TempSrc:   PainSc: 0-No pain         Complications: No apparent anesthesia complications

## 2019-05-08 DIAGNOSIS — M4802 Spinal stenosis, cervical region: Secondary | ICD-10-CM | POA: Diagnosis not present

## 2019-05-08 LAB — GLUCOSE, CAPILLARY: Glucose-Capillary: 132 mg/dL — ABNORMAL HIGH (ref 70–99)

## 2019-05-08 MED ORDER — OXYCODONE-ACETAMINOPHEN 5-325 MG PO TABS: 1.0000 | ORAL_TABLET | ORAL | 0 refills | Status: DC | PRN

## 2019-05-08 MED FILL — OXYCODONE-ACETAMINOPHEN 5-3: 5-325 | 3 days supply | Qty: 30 | Fill #0

## 2019-05-08 MED FILL — Thrombin (Recombinant) For Soln 20000 Unit: CUTANEOUS | Qty: 1 | Status: AC

## 2019-05-08 NOTE — Anesthesia Postprocedure Evaluation (Signed)
Anesthesia Post Note  Patient: [name]  Procedure(s) Performed: ANTERIOR CERVICAL DECOMPRESSION FUSION CERVICAL 5-6, CERVICAL 6-7 WITH INSTRUMENTATION AND ALLOGRAFT (N/A )     Patient location during evaluation: PACU Anesthesia Type: General Level of consciousness: awake and alert Pain management: pain level controlled Vital Signs Assessment: post-procedure vital signs reviewed and stable Respiratory status: spontaneous breathing, nonlabored ventilation, respiratory function stable and patient connected to nasal cannula oxygen Cardiovascular status: blood pressure returned to baseline and stable Postop Assessment: no apparent nausea or vomiting Anesthetic complications: no    Last Vitals:  Vitals:   05/08/19 0350 05/08/19 0834  BP: (!) 166/89 (!) 146/92  Pulse: 87 90  Resp: 18 18  Temp: 37.2 C 36.8 C  SpO2: 100% 98%    Last Pain:  Vitals:   05/08/19 0846  TempSrc:   PainSc: [place]

## 2019-05-08 NOTE — Progress Notes (Signed)
Patient is discharged from room 3C05 at this time. Alert and in stable condition. IV site d/c'd and instructions read to patient and spouse with understanding verbalized. Left unit via wheelchair with all belongings at side. 

## 2019-05-08 NOTE — Progress Notes (Signed)
    Patient doing well  Denies arm pain Tolerating PO well   Physical Exam: Vitals:   05/07/19 2303 05/08/19 0350  BP: (!) 155/86 (!) 166/89  Pulse: 72 87  Resp: 18 18  Temp: 98.1 F (36.7 C) 99 F (37.2 C)  SpO2: 99% 100%    Neck soft/supple Dressing in place NVI  POD #1 s/p ACDF, doing well  - encourage ambulation - Percocet for pain, Valium for muscle spasms - d/c home today with f/u in 2 weeks  - regarding HTN, patient instructed to f/u with PCP

## 2019-05-11 ENCOUNTER — Encounter: Payer: Self-pay | Admitting: *Deleted

## 2019-05-13 NOTE — Discharge Summary (Signed)
Patient ID: [name] MRN: [id number] DOB/AGE: 05/09/1960 59 y.o.  Admit date: 05/07/2019 Discharge date: 05/08/2019  Admission Diagnoses:  Active Problems:   Radiculopathy   Discharge Diagnoses:  Same  Past Medical History:  Diagnosis Date  . Anxiety   . 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 Brain tumor (benign) ([place]) 2018  . Chest pain 05/15/2015  . Complication of anesthesia   . Depression    in the past  . Diabetes mellitus without complication ([place])    Type II  . GERD (gastroesophageal reflux disease)    04/30/2019- "in the past"  . H/O varicella   . History of measles, mumps, or rubella   . HSV-2 (herpes simplex virus 2) infection   . Hx of ovarian cyst   . Hyperlipidemia   . Hypersomnia 07/19/2015  . Hypertension    "sometimes"   . Migraine headache    "due to cervical issuses"  . Plantar fasciitis   . PONV (postoperative nausea and vomiting)   . Scapular dyskinesis 10/03/2016  . Sleep apnea    pt denies  . Sleep deprivation    loss of sleep  . VAIN I (vaginal intraepithelial neoplasia grade I) 2013   pap and confirmed by colposcopic biopsy    Surgeries: Procedure(s): ANTERIOR CERVICAL DECOMPRESSION FUSION CERVICAL 5-6, CERVICAL 6-7 WITH INSTRUMENTATION AND ALLOGRAFT on 05/07/2019   Consultants: none  Discharged Condition: Improved  Hospital Course: [name] is an 59 y.o. female who was admitted 05/07/2019 for operative treatment of radiculopathy. Patient has severe unremitting pain that affects sleep, daily activities, and work/hobbies. After pre-op clearance the patient was taken to the operating room on 05/07/2019 and underwent  Procedure(s): ANTERIOR CERVICAL DECOMPRESSION FUSION CERVICAL 5-6, CERVICAL 6-7 WITH INSTRUMENTATION AND ALLOGRAFT.    Patient was given perioperative antibiotics:  Anti-infectives (From admission, onward)   Start     Dose/Rate Route Frequency Ordered Stop   05/07/19 2000  ceFAZolin  (ANCEF) IVPB 2g/100 mL premix     2 g 200 mL/hr over 30 Minutes Intravenous Every 8 hours 05/07/19 1644 05/08/19 0747   05/07/19 0900  ceFAZolin (ANCEF) IVPB 2g/100 mL premix     2 g 200 mL/hr over 30 Minutes Intravenous On call to O.R. 05/07/19 0850 05/07/19 1227   05/07/19 0838  ceFAZolin (ANCEF) 2-4 GM/100ML-% IVPB    Note to Pharmacy: Gregery Na   : cabinet override      05/07/19 0838 05/07/19 1252       Patient was given sequential compression devices, early ambulation to prevent DVT.  Patient benefited maximally from hospital stay and there were no complications.    Recent vital signs: BP (!) 146/92 (BP Location: Left Arm)   Pulse 90   Temp 98.3 F (36.8 C) (Oral)   Resp 18   Ht 5\' 5"  (1.651 m)   Wt 90.3 kg   LMP  (LMP Unknown)   SpO2 98%   BMI 33.12 kg/m    Discharge Medications:   Allergies as of 05/08/2019      Reactions   Sulfa Antibiotics Anaphylaxis, Hives, Swelling      Medication List    TAKE these medications   Accu-Chek FastClix Lancets Misc USE AS DIRECTED TO CHECK BLOOD SUGAR ONCE DAILY   Accu-Chek Guide test strip Generic drug: glucose blood USE AS DIRECTED TO CHECK BLOOD SUGAR ONCE DAILY   cyclobenzaprine 10 MG tablet Commonly known as: FLEXERIL Take 1 tablet (10  mg total) by mouth 3 (three) times daily as needed for muscle spasms.   estradiol 0.075 MG/24HR Commonly known as: VIVELLE-DOT Place 1 patch onto the skin 2 (two) times a week.   ezetimibe 10 MG tablet Commonly known as: ZETIA Take 1 tablet (10 mg total) by mouth daily.   gabapentin 100 MG capsule Commonly known as: Neurontin Take 1 capsule in AM and 2 capsules at bedtime for one week, then 2 capsules twice daily   metFORMIN 500 MG 24 hr tablet Commonly known as: GLUCOPHAGE-XR TAKE 1 TABLET BY MOUTH DAILY WITH A MEAL. What changed: See the new instructions.   oxyCODONE-acetaminophen 5-325 MG tablet Commonly known as: PERCOCET/ROXICET Take 1-2 tablets by mouth every 4  (four) hours as needed for moderate pain or severe pain.   phentermine 37.5 MG capsule Take 1 capsule (37.5 mg total) by mouth every morning.   rosuvastatin 5 MG tablet Commonly known as: CRESTOR Take 1 tablet (5 mg total) by mouth daily.   topiramate 50 MG tablet Commonly known as: TOPAMAX Take 1 tablet (50 mg total) by mouth 2 (two) times daily.   Vitamin D (Ergocalciferol) 1.25 MG (50000 UNIT) Caps capsule Commonly known as: DRISDOL Take 1 capsule (50,000 Units total) by mouth every 7 (seven) days.   Wrist Brace/Left Medium Misc 1 each by Does not apply route as needed.       Diagnostic Studies: DG Cervical Spine 1 View  Result Date: 05/07/2019 CLINICAL DATA:  C5-C7 ACDF EXAM: DG CERVICAL SPINE - 1 VIEW; DG C-ARM 1-60 MIN COMPARISON:  10/12/2017 FINDINGS: 2 C-arm fluoroscopic images were obtained intraoperatively and submitted for post operative interpretation. Two lateral images demonstrate interval placement of ACDF hardware at C5-C7. Radiopaque markers related to surgical sponges in the anterior soft tissues. Endotracheal tube partially visualized. 14 seconds of fluoroscopy time was utilized. Please see the performing provider's procedural report for further detail. IMPRESSION: As above. Electronically Signed   By: Davina Poke D.O.   On: 05/07/2019 15:03   DG C-Arm 1-60 Min  Result Date: 05/07/2019 CLINICAL DATA:  C5-C7 ACDF EXAM: DG CERVICAL SPINE - 1 VIEW; DG C-ARM 1-60 MIN COMPARISON:  10/12/2017 FINDINGS: 2 C-arm fluoroscopic images were obtained intraoperatively and submitted for post operative interpretation. Two lateral images demonstrate interval placement of ACDF hardware at C5-C7. Radiopaque markers related to surgical sponges in the anterior soft tissues. Endotracheal tube partially visualized. 14 seconds of fluoroscopy time was utilized. Please see the performing provider's procedural report for further detail. IMPRESSION: As above. Electronically Signed   By:  Davina Poke D.O.   On: 05/07/2019 15:03    Disposition: Discharge disposition: 01-Home or Self Care        POD #1 s/p ACDF, doing well  - encourage ambulation - Percocet for pain, Valium for muscle spasms - regarding HTN, patient instructed to f/u with PCP -Scripts for pain sent to pharmacy electronically  -D/C instructions sheet printed and in chart -D/C today  -F/U in office 2 weeks   Signed: Lennie Muckle Nahima Ales 05/13/2019, 10:15 AM

## 2019-05-20 MED FILL — METHOCARBAMOL 500 MG TABS: 500 | 15 days supply | Qty: 60 | Fill #0

## 2019-05-24 ENCOUNTER — Ambulatory Visit: Payer: No Typology Code available for payment source | Attending: Internal Medicine

## 2019-05-24 DIAGNOSIS — Z23 Encounter for immunization: Secondary | ICD-10-CM | POA: Insufficient documentation

## 2019-05-24 NOTE — Progress Notes (Signed)
   Covid-19 Vaccination Clinic  Name:  [name]    MRN: [id number] DOB: 06/30/1960  05/24/2019  [name] was observed post Covid-19 immunization for 30 minutes based on pre-vaccination screening without incident. She was provided with Vaccine Information Sheet and instruction to access the V-Safe system.   [name] was instructed to call 911 with any severe reactions post vaccine: Marland Kitchen Difficulty breathing  . Swelling of face and throat  . A fast heartbeat  . A bad rash all over body  . Dizziness and weakness   Immunizations Administered    Name Date Dose VIS Date Route   Pfizer COVID-19 Vaccine 05/24/2019  2:16 PM 0.3 mL 02/27/2019 Intramuscular   Manufacturer: [place]   Lot: [id number]   [place]: [id number]

## 2019-06-05 LAB — HM DIABETES EYE EXAM

## 2019-06-06 ENCOUNTER — Encounter: Payer: Self-pay | Admitting: Family Medicine

## 2019-06-06 ENCOUNTER — Telehealth: Payer: Self-pay | Admitting: Family Medicine

## 2019-06-06 NOTE — Telephone Encounter (Signed)
I gave her a check-in call.  [name] mentioned she wanted me to give her a call.  The patient gave me an update on how she is doing post-op. Basically, she is making a rapid recovery. Her pain has improved overall, although she still has the right wrist pain, for which she has a f/u appointment with the orthopedic.  I seized this opportunity to talk with her about [employer] Patient Focus Group Discussion on healthcare experiences and need - Coordinated by Andreas Blower. She is interested in participation.  I advised her that someone will contact her soon for registration. She verbalized understanding.

## 2019-06-08 MED FILL — CEPHALEXIN 500 MG CAPSULE: 500 | 5 days supply | Qty: 20 | Fill #0

## 2019-06-10 ENCOUNTER — Telehealth: Payer: Self-pay | Admitting: Obstetrics and Gynecology

## 2019-06-10 NOTE — Telephone Encounter (Signed)
Patient is having left breast irritation.

## 2019-06-10 NOTE — Progress Notes (Signed)
GYNECOLOGY  VISIT   HPI: 59 y.o.   Married  [place] American  female   [phone] with No LMP recorded (lmp unknown). Patient has had a hysterectomy.   here for left breast irritation/itching and states skin feels rough on breast starting 2 - 3 weeks ago.  Thought her bra was irritating the skin.  No change in soaps or deoderant.  No lumps.  Just started an antibiotic for incisional irritation and suture poking through the skin.   No hx eczema.   Had a cervical spine fusion.  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   She is asking for phentermine.   Mom passed away on Thanksgiving.  Brother passed away 02-Jan-2023.   Had her first Covid vaccine.  Second one is due on April 7.   GYNECOLOGIC HISTORY: No LMP recorded (lmp unknown). Patient has had a hysterectomy. Contraception:  Hyst Menopausal hormone therapy:  none Last mammogram:  01-22-19 3D/Neg/density C/BiRads1 Last pap smear: 11-12-16 Neg        OB History    Gravida  3   Para  2   Term  2   Preterm      AB  1   Living  2     SAB      TAB      Ectopic      Multiple      Live Births  2              Patient Active Problem List   Diagnosis Date Noted  . Radiculopathy 05/07/2019  . 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 Major depressive disorder, recurrent episode, moderate ([place]) 04/28/2019  . Wrist swelling 04/28/2019  . Paresthesia 11/18/2018  . Hot flashes 10/07/2018  . Elevated BP without diagnosis of hypertension 10/07/2018  . Carpal tunnel syndrome of left wrist 05/16/2018  . Cervical disc disorder with radiculopathy of cervical region 12/11/2016  . Vitamin D deficiency 12/04/2016  . Right shoulder pain 08/29/2016  . Sleep apnea 05/27/2015  . Solitary pulmonary nodule 05/27/2015  . GERD (gastroesophageal reflux disease) 05/13/2015  . Type 2 diabetes mellitus with other specified complication ([place]) [id number]  . Depression with anxiety 01/22/2014  . VAIN I (vaginal intraepithelial  neoplasia grade I)   . Hyperlipidemia 10/22/2013  . Migraine headache 10/04/2008    Past Medical History:  Diagnosis Date  . Anxiety   . 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 Brain tumor (benign) ([place]) 2018  . Chest pain 05/15/2015  . Complication of anesthesia   . Depression    in the past  . Diabetes mellitus without complication ([place])    Type II  . GERD (gastroesophageal reflux disease)    04/30/2019- "in the past"  . H/O varicella   . History of measles, mumps, or rubella   . HSV-2 (herpes simplex virus 2) infection   . Hx of ovarian cyst   . Hyperlipidemia   . Hypersomnia 07/19/2015  . Hypertension    "sometimes"   . Migraine headache    "due to cervical issuses"  . Plantar fasciitis   . PONV (postoperative nausea and vomiting)   . Scapular dyskinesis 10/03/2016  . Sleep apnea    pt denies  . Sleep deprivation    loss of sleep  . VAIN I (vaginal intraepithelial neoplasia grade I) 2013   pap and confirmed by colposcopic biopsy    Past Surgical History:  Procedure Laterality Date  .  ABDOMINAL HYSTERECTOMY    . ANTERIOR CERVICAL DECOMP/DISCECTOMY FUSION N/A 05/07/2019   Procedure: ANTERIOR CERVICAL DECOMPRESSION FUSION CERVICAL 5-6, CERVICAL 6-7 WITH INSTRUMENTATION AND ALLOGRAFT;  Surgeon: Phylliss Bob, MD;  Location: [facility];  Service: Orthopedics;  Laterality: N/A;  . CHOLECYSTECTOMY  11/13/10  . COLONOSCOPY  Multiple  . DILATION AND CURETTAGE OF UTERUS    . ESOPHAGOGASTRODUODENOSCOPY    . OOPHORECTOMY  2009   bilateral salpingo-oophorectomy.  . TUBAL LIGATION      Current Outpatient Medications  Medication Sig Dispense Refill  . Accu-Chek FastClix Lancets MISC USE AS DIRECTED TO CHECK BLOOD SUGAR ONCE DAILY 102 each 3  . ACCU-CHEK GUIDE test strip USE AS DIRECTED TO CHECK BLOOD SUGAR ONCE DAILY 100 strip 3  . cephALEXin (KEFLEX) 500 MG capsule Take 1 capsule by mouth every 6 (six) hours.    Water engineer Bandages & Supports (WRIST BRACE/LEFT MEDIUM) MISC  1 each by Does not apply route as needed. 1 each 0  . estradiol (VIVELLE-DOT) 0.075 MG/24HR Place 1 patch onto the skin 2 (two) times a week. 8 patch 11  . ezetimibe (ZETIA) 10 MG tablet Take 1 tablet (10 mg total) by mouth daily. 90 tablet 3  . gabapentin (NEURONTIN) 100 MG capsule Take 1 capsule in AM and 2 capsules at bedtime for one week, then 2 capsules twice daily 120 capsule 0  . metFORMIN (GLUCOPHAGE-XR) 500 MG 24 hr tablet TAKE 1 TABLET BY MOUTH DAILY WITH A MEAL. (Patient taking differently: Take 500 mg by mouth daily with breakfast. ) 180 tablet 3  . methocarbamol (ROBAXIN) 500 MG tablet Take 1 tablet by mouth as needed.    Marland Kitchen oxyCODONE-acetaminophen (PERCOCET/ROXICET) 5-325 MG tablet Take 1-2 tablets by mouth every 4 (four) hours as needed for moderate pain or severe pain. 30 tablet 0  . rosuvastatin (CRESTOR) 5 MG tablet Take 1 tablet (5 mg total) by mouth daily. 90 tablet 3  . topiramate (TOPAMAX) 50 MG tablet Take 1 tablet (50 mg total) by mouth 2 (two) times daily. 60 tablet 5   No current facility-administered medications for this visit.     ALLERGIES: Sulfa antibiotics  Family History  Problem Relation Age of Onset  . Hypertension Mother   . Hypotension Mother   . Anemia Mother        low iron  . Heart disease Sister   . Sickle cell trait Other   . Alcohol abuse Brother   . Alcohol abuse Brother   . Aneurysm Brother   . Drug abuse Brother   . Colon cancer Neg Hx   . Esophageal cancer Neg Hx   . Stomach cancer Neg Hx   . Breast cancer Neg Hx     Social History   Socioeconomic History  . Marital status: Married    Spouse name: [name]  . Number of children: 2  . Years of education: 16  . Highest education level: Associate degree: academic program  Occupational History    Employer: [employer]  Tobacco Use  . Smoking status: Former Smoker    Packs/day: 0.50    Years: 30.00    Pack years: 15.00    Types: Cigarettes    Quit date: 2012    Years since  quitting: 9.2  . Smokeless tobacco: Never Used  Substance and Sexual Activity  . Alcohol use: No    Alcohol/week: 0.0 standard drinks  . Drug use: No  . Sexual activity: Yes    Partners: Male  Birth control/protection: Surgical    Comment: Hysterectomy  Other Topics Concern  . Not on file  Social History Narrative   Patient lives at home with her husband [name]). 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 Two children. Both boys      Patient is right-handed. She lives in a one level home. She drinks coffee occasionally. She does not regularly exercise.   Social Determinants of Health   Financial Resource Strain:   . Difficulty of Paying Living Expenses:   Food Insecurity:   . Worried About Charity fundraiser in the Last Year:   . Arboriculturist in the Last Year:   Transportation Needs:   . Film/video editor (Medical):   Marland Kitchen Lack of Transportation (Non-Medical):   Physical Activity:   . Days of Exercise per Week:   . Minutes of Exercise per Session:   Stress:   . Feeling of Stress :   Social Connections:   . Frequency of Communication with Friends and Family:   . Frequency of Social Gatherings with Friends and Family:   . Attends Religious Services:   . Active Member of Clubs or Organizations:   . Attends Archivist Meetings:   Marland Kitchen Marital Status:   Intimate Partner Violence:   . Fear of Current or Ex-Partner:   . Emotionally Abused:   Marland Kitchen Physically Abused:   . Sexually Abused:     Review of Systems  Skin:       Left breast itching/irritation  All other systems reviewed and are negative.   PHYSICAL EXAMINATION:    BP 122/80 (Cuff Size: Large)   Pulse 88   Temp 97.9 F (36.6 C) (Temporal)   Ht 5\' 4"  (1.626 m)   Wt 203 lb 12.8 oz (92.4 kg)   LMP  (LMP Unknown)   BMI 34.98 kg/m     General appearance: alert, cooperative and appears stated age   Breasts: normal  appearance, no masses or tenderness, No nipple retraction or dimpling, No nipple discharge or bleeding, No axillary adenopathy. Left breast with 4 cm patch of slightly dry and darker skin coloration.    Chaperone was present for exam.  ASSESSMENT  Left breast skin rash. Dermatitis.  No concern for malignancy.  Request for weight loss medication.  Bereavement.   PLAN  Ok for OTC benadryl orally.  Triamcinolone ointment 0.025% bid prn.  I have declined refill for Phentermine.  I recommend she consider organized weight loss program. Support given for loss of family members and surgical recovery.  Fu for annual exam and prn.    An After Visit Summary was printed and given to the patient.  __15____ minutes face to face time of which over 50% was spent in counseling.

## 2019-06-10 NOTE — Telephone Encounter (Signed)
Spoke to pt. Pt states having itching and skin is rough on left breast for about 2 weeks and now is concerned about what is making it happen. Pt had surgery on 05/07/2019 for cervical neck fusion. Pt states has been taking  Percocet. Pt has not taken in 1 week. Denies rash, pain and discharge from nipple, has not changed soaps, lotions or detergents. Pt advised for OV. Pt agreeable. Pt denies earlier appt with Johny Shock, CNM and prefers Dr Quincy Simmonds. Pt scheduled for OV with Dr Quincy Simmonds on 06/11/2019 at 9 am. Pt verbalized understanding. CPS neg.   Routing to Dr Quincy Simmonds for review and will close encounter.

## 2019-06-11 ENCOUNTER — Encounter: Payer: Self-pay | Admitting: Obstetrics and Gynecology

## 2019-06-11 ENCOUNTER — Ambulatory Visit (INDEPENDENT_AMBULATORY_CARE_PROVIDER_SITE_OTHER): Payer: No Typology Code available for payment source | Admitting: Obstetrics and Gynecology

## 2019-06-11 ENCOUNTER — Other Ambulatory Visit: Payer: Self-pay

## 2019-06-11 VITALS — BP 122/80 | HR 88 | Temp 97.9°F | Ht 64.0 in | Wt 203.8 lb

## 2019-06-11 DIAGNOSIS — R635 Abnormal weight gain: Secondary | ICD-10-CM | POA: Diagnosis not present

## 2019-06-11 DIAGNOSIS — R21 Rash and other nonspecific skin eruption: Secondary | ICD-10-CM | POA: Diagnosis not present

## 2019-06-11 MED ORDER — TRIAMCINOLONE ACETONIDE 0.025 % EX OINT: 1.0000 "application " | TOPICAL_OINTMENT | Freq: Two times a day (BID) | CUTANEOUS | 0 refills | Status: DC

## 2019-06-11 MED FILL — TRIAMCINOLONE 0.025% OINT: 0.025 | 15 days supply | Qty: 30 | Fill #0

## 2019-06-24 ENCOUNTER — Ambulatory Visit: Payer: No Typology Code available for payment source | Attending: Internal Medicine

## 2019-06-24 DIAGNOSIS — Z23 Encounter for immunization: Secondary | ICD-10-CM

## 2019-06-24 NOTE — Progress Notes (Signed)
   Covid-19 Vaccination Clinic  Name:  [name]    MRN: [id number] DOB: [birth year]  06/24/2019  [name] was observed post Covid-19 immunization for 30 minutes based on pre-vaccination screening without incident. She was provided with Vaccine Information Sheet and instruction to access the V-Safe system.   [name] was instructed to call 911 with any severe reactions post vaccine: Marland Kitchen Difficulty breathing  . Swelling of face and throat  . A fast heartbeat  . A bad rash all over body  . Dizziness and weakness   Immunizations Administered    Name Date Dose VIS Date Route   Pfizer COVID-19 Vaccine 06/24/2019  9:33 AM 0.3 mL 02/27/2019 Intramuscular   Manufacturer: [place]   Lot: [id number]   [place]: [id number]

## 2019-07-03 ENCOUNTER — Encounter: Payer: Self-pay | Admitting: Internal Medicine

## 2019-07-03 ENCOUNTER — Other Ambulatory Visit: Payer: Self-pay | Admitting: Orthopedic Surgery

## 2019-07-03 ENCOUNTER — Other Ambulatory Visit: Payer: Self-pay

## 2019-07-03 ENCOUNTER — Ambulatory Visit (INDEPENDENT_AMBULATORY_CARE_PROVIDER_SITE_OTHER): Payer: No Typology Code available for payment source | Admitting: Internal Medicine

## 2019-07-03 VITALS — BP 120/90 | HR 83 | Ht 64.0 in | Wt 201.0 lb

## 2019-07-03 DIAGNOSIS — E559 Vitamin D deficiency, unspecified: Secondary | ICD-10-CM | POA: Diagnosis not present

## 2019-07-03 DIAGNOSIS — E538 Deficiency of other specified B group vitamins: Secondary | ICD-10-CM | POA: Diagnosis not present

## 2019-07-03 DIAGNOSIS — E785 Hyperlipidemia, unspecified: Secondary | ICD-10-CM | POA: Diagnosis not present

## 2019-07-03 DIAGNOSIS — R7989 Other specified abnormal findings of blood chemistry: Secondary | ICD-10-CM

## 2019-07-03 DIAGNOSIS — E1165 Type 2 diabetes mellitus with hyperglycemia: Secondary | ICD-10-CM | POA: Diagnosis not present

## 2019-07-03 LAB — LIPID PANEL
Cholesterol: 260 mg/dL — ABNORMAL HIGH (ref 0–200)
HDL: 51.6 mg/dL (ref >39.00)
LDL Cholesterol: 190 mg/dL — ABNORMAL HIGH (ref 0–99)
NonHDL: 208.23
Total CHOL/HDL Ratio: 5
Triglycerides: 91 mg/dL (ref 0.0–149.0)
VLDL: 18.2 mg/dL (ref 0.0–40.0)

## 2019-07-03 LAB — VITAMIN D 25 HYDROXY (VIT D DEFICIENCY, FRACTURES): VITD: 22.72 ng/mL — ABNORMAL LOW (ref 30.00–100.00)

## 2019-07-03 LAB — VITAMIN B12: Vitamin B-12: 347 pg/mL (ref 211–911)

## 2019-07-03 MED ORDER — ROSUVASTATIN CALCIUM 10 MG PO TABS: 10.0000 mg | ORAL_TABLET | Freq: Every day | ORAL | 3 refills | Status: DC

## 2019-07-03 MED FILL — ROSUVASTATIN CALCIUM 10 MG: 10 | 90 days supply | Qty: 90 | Fill #0

## 2019-07-03 NOTE — Progress Notes (Signed)
Patient ID: [name], female   DOB: April 16, 1960, 59 y.o.   MRN: [id number]  This visit occurred during the SARS-CoV-2 public health emergency.  Safety protocols were in place, including screening questions prior to the visit, additional usage of staff PPE, and extensive cleaning of exam room while observing appropriate contact time as indicated for disinfecting solutions.   HPI: [name] is a 59 y.o.-year-old female, presenting for f/u for DM2; prediabetes dx early 2000s, controlled, without long term complications, hyperlipidemia, subclinical thyrotoxicosis. Also, vitamin D deficiency and low vitamin B12.last visit 6 months ago.  Before last visit, she saw neurology for migraines (related to neck pain, C5-C6 - will get a steroid inj soon), carpal tunnel, and peripheral neuropathy. She also has rotator cuff pbs.  She had cervical decompression surgery in 04/2019.  She did gain weight after the surgery.  Reviewed HbA1c levels: Lab Results  Component Value Date   HGBA1C 6.5 (H) 04/30/2019   HGBA1C 6.4 11/18/2018   HGBA1C 6.2 (H) 05/12/2018  She had several steroid inj in the past- last in 04/2014.  She is on: - Metformin ER 500 mg 2x a day >> we had to decrease to once a day 2/2 GI discomfort >> 500 mg with dinner She previously tried: - Tradjenta 5 mg in am, before b'fast  - Invokana 100 mg in am - started 03/2015 >> she had CP, HA, lightheadedness - regular metformin >> diarrhea/nausea with regular metformin.  Pt checks sugars twice a day: - am:  88-131 >> 88-95 >> 87-90s, 101 >> 88-102 - 2h after b'fast:  n/c >> 90-125 >> 86-127 >> n/c - lunch: 95-100 >> 90s >> n/c >> 87-120 >> n/c - 2h after lunch:  95-100 >> n/c >> 86-118 >> n/c - before dinner:80-100 >> n/c >> 80-127 >> n/c - 2h after dinner:up to 135 >> 90-120 >> 98-103 >> 110-118 >> 112-115 - bedtime:  85-100 >> n/c >> 97-121 >> n/c - nighttime: 89-116 Lowest sugar was  66....>> 87 >> 88; it is unclear  at which level she has hypoglycemia awareness. Highest: 135 ... >> 118 >> 130.  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  -No history of CKD: Lab Results  Component Value Date   BUN 10 04/30/2019   CREATININE 0.98 04/30/2019   Lab Results  Component Value Date   MICRALBCREAT 30-300 10/07/2018   MICRALBCREAT 0.8 07/04/2017   MICRALBCREAT <5.6 12/11/2016   - last eye exam was in 05/2019: No DR. + cataract. [facility]. Saw Dr [name]: + history of a corneal ulcer -She has numbness, tingling, burning in her feet- related to cervical spondylosis -on Neurontin  HL: She has a history of a very high LDL: Lab Results  Component Value Date   CHOL 224 (H) 01/02/2019   CHOL 231 (H) 12/30/2017   CHOL 255 (H) 07/04/2017   Lab Results  Component Value Date   HDL 47.20 01/02/2019   HDL 47.60 12/30/2017   HDL 42.20 07/04/2017   Lab Results  Component Value Date   LDLCALC 158 (H) 01/02/2019   LDLCALC 164 (H) 12/30/2017   LDLCALC 196 (H) 07/04/2017   Lab Results  Component Value Date   TRIG 96.0 01/02/2019   TRIG 101.0 12/30/2017  TRIG 84.0 07/04/2017   Lab Results  Component Value Date   CHOLHDL 5 01/02/2019   CHOLHDL 5 12/30/2017   CHOLHDL 6 07/04/2017   At last visit we changed from Livalo to Crestor.  Also on Zetia.  She had normal LFTs: Lab Results  Component Value Date   ALT 15 04/30/2019   AST 16 04/30/2019   ALKPHOS 88 04/30/2019   BILITOT 0.5 04/30/2019   In spring 2018 we checked her vitamin levels due to fatigue: vitamin B12 and D were low: Lab Results  Component Value Date   VD25OH 32.8 11/18/2018   VD25OH 16.8 (L) 02/07/2018   VD25OH 32.81 12/30/2017   VD25OH 23.26 (L) 07/04/2017   VD25OH 17.98 (L) 08/03/2016    VD25OH 9 (L) 05/13/2015   Lab Results  Component Value Date   VITAMINB12 584 11/18/2018   VITAMINB12 613 02/07/2018   VITAMINB12 390 12/30/2017   VITAMINB12 368 07/04/2017   VITAMINB12 315 08/03/2016   VITAMINB12 428 05/13/2015   VITAMINB12 496 01/22/2014   She was on: -B12 injections, previously p.o. B12 5000 units daily -now back to injections, but not completely compliant >> now off -Ergocalciferol 50,000 units weekly >> stopped 04/2019  Subclinical thyrotoxicosis   TFTs normalized in the last 2 years: Lab Results  Component Value Date   TSH 0.938 11/18/2018   TSH 0.42 07/04/2017   TSH 0.97 12/04/2016   TSH 0.34 (L) 08/03/2016   TSH 0.430 (L) 06/08/2016   TSH 0.77 05/13/2015   TSH 0.619 01/22/2014   TSH 0.730 10/14/2013   TSH 0.492 09/01/2012   TSH 0.745 10/05/2011   Lab Results  Component Value Date   FREET4 0.70 07/04/2017   FREET4 0.77 12/04/2016   FREET4 0.79 08/03/2016   T3FREE 3.8 07/04/2017   T3FREE 3.4 12/04/2016   T3FREE 3.6 08/03/2016   She has a history of dysphagia and has had esophageal stretching in the past.  ROS: Constitutional: no weight gain/no weight loss, no fatigue, no subjective hyperthermia, no subjective hypothermia Eyes: no blurry vision, no xerophthalmia ENT: no sore throat, no nodules palpated in neck, no dysphagia, no odynophagia, no hoarseness Cardiovascular: no CP/no SOB/no palpitations/no leg swelling Respiratory: no cough/no SOB/no wheezing Gastrointestinal: no N/no V/no D/no C/no acid reflux Musculoskeletal: no muscle aches/no joint aches Skin: no rashes, no hair loss Neurological: no tremors/+ numbness/+ tingling/no dizziness  I reviewed pt's medications, allergies, PMH, social hx, family hx, and changes were documented in the history of present illness. Otherwise, unchanged from my initial visit note.  Past Medical History:  Diagnosis Date  . Anxiety   . 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 Brain tumor  (benign) ([place]) 2018  . Chest pain 05/15/2015  . Complication of anesthesia   . Depression    in the past  . Diabetes mellitus without complication ([place])    Type II  . GERD (gastroesophageal reflux disease)    04/30/2019- "in the past"  . H/O varicella   . History of measles, mumps, or rubella   . HSV-2 (herpes simplex virus 2) infection   . Hx of ovarian cyst   . Hyperlipidemia   . Hypersomnia 07/19/2015  . Hypertension    "sometimes"   . Migraine headache    "due to cervical issuses"  . Plantar fasciitis   . PONV (postoperative nausea and vomiting)   . Scapular dyskinesis 10/03/2016  . Sleep apnea    pt denies  . Sleep deprivation  loss of sleep  . 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 ABDOMINAL HYSTERECTOMY    . ANTERIOR CERVICAL DECOMP/DISCECTOMY FUSION N/A 05/07/2019   Procedure: ANTERIOR CERVICAL DECOMPRESSION FUSION CERVICAL 5-6, CERVICAL 6-7 WITH INSTRUMENTATION AND ALLOGRAFT;  Surgeon: Phylliss Bob, MD;  Location: [facility];  Service: Orthopedics;  Laterality: N/A;  . CHOLECYSTECTOMY  11/13/10  . COLONOSCOPY  Multiple  . DILATION AND CURETTAGE OF UTERUS    . ESOPHAGOGASTRODUODENOSCOPY    . OOPHORECTOMY  2009   bilateral salpingo-oophorectomy.  . TUBAL LIGATION     Social History   Socioeconomic History  . Marital status: Married    Spouse name: [name]  . Number of children: 2  . Years of education: 44  . Highest education level: Associate degree: academic program  Occupational History    Employer: [employer]  Tobacco Use  . Smoking status: Former Smoker    Packs/day: 0.50    Years: 30.00    Pack years: 15.00    Types: Cigarettes    Quit date: 2012    Years since quitting: 9.2  . Smokeless tobacco: Never Used  Substance and Sexual Activity  . Alcohol use: No    Alcohol/week: 0.0 standard drinks  . Drug use: No  . Sexual activity: Yes     Partners: Male    Birth control/protection: Surgical    Comment: Hysterectomy  Other Topics Concern  . Not on file  Social History Narrative   Patient lives at home with her husband [name]). 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 Two children. Both boys      Patient is right-handed. She lives in a one level home. She drinks coffee occasionally. She does not regularly exercise.   Social Determinants of Health   Financial Resource Strain:   . Difficulty of Paying Living Expenses:   Food Insecurity:   . Worried About Charity fundraiser in the Last Year:   . Arboriculturist in the Last Year:   Transportation Needs:   . Film/video editor (Medical):   Marland Kitchen Lack of Transportation (Non-Medical):   Physical Activity:   . Days of Exercise per Week:   . Minutes of Exercise per Session:   Stress:   . Feeling of Stress :   Social Connections:   . Frequency of Communication with Friends and Family:   . Frequency of Social Gatherings with Friends and Family:   . Attends Religious Services:   . Active Member of Clubs or Organizations:   . Attends Archivist Meetings:   Marland Kitchen Marital Status:   Intimate Partner Violence:   . Fear of Current or Ex-Partner:   . Emotionally Abused:   Marland Kitchen Physically Abused:   . Sexually Abused:    Current Outpatient Medications on File Prior to Visit  Medication Sig Dispense Refill  . Accu-Chek FastClix Lancets MISC USE AS DIRECTED TO CHECK BLOOD SUGAR ONCE DAILY 102 each 3  . ACCU-CHEK GUIDE test strip USE AS DIRECTED TO CHECK BLOOD SUGAR ONCE DAILY 100 strip 3  . cephALEXin (KEFLEX) 500 MG capsule Take 1 capsule by mouth every 6 (six) hours.    Water engineer Bandages & Supports (WRIST BRACE/LEFT MEDIUM) MISC 1 each by Does not apply route as needed. 1 each 0  . estradiol (VIVELLE-DOT) 0.075  MG/24HR Place 1 patch onto the skin 2 (two) times a week. 8 patch 11  . ezetimibe  (ZETIA) 10 MG tablet Take 1 tablet (10 mg total) by mouth daily. 90 tablet 3  . gabapentin (NEURONTIN) 100 MG capsule Take 1 capsule in AM and 2 capsules at bedtime for one week, then 2 capsules twice daily 120 capsule 0  . metFORMIN (GLUCOPHAGE-XR) 500 MG 24 hr tablet TAKE 1 TABLET BY MOUTH DAILY WITH A MEAL. (Patient taking differently: Take 500 mg by mouth daily with breakfast. ) 180 tablet 3  . methocarbamol (ROBAXIN) 500 MG tablet Take 1 tablet by mouth as needed.    Marland Kitchen oxyCODONE-acetaminophen (PERCOCET/ROXICET) 5-325 MG tablet Take 1-2 tablets by mouth every 4 (four) hours as needed for moderate pain or severe pain. 30 tablet 0  . rosuvastatin (CRESTOR) 5 MG tablet Take 1 tablet (5 mg total) by mouth daily. 90 tablet 3  . topiramate (TOPAMAX) 50 MG tablet Take 1 tablet (50 mg total) by mouth 2 (two) times daily. 60 tablet 5  . triamcinolone (KENALOG) 0.025 % ointment Apply 1 application topically 2 (two) times daily. 30 g 0   No current facility-administered medications on file prior to visit.   Allergies  Allergen Reactions  . Sulfa Antibiotics Anaphylaxis, Hives and Swelling   Family History  Problem Relation Age of Onset  . Hypertension Mother   . Hypotension Mother   . Anemia Mother        low iron  . Heart disease Sister   . Sickle cell trait Other   . Alcohol abuse Brother   . Alcohol abuse Brother   . Aneurysm Brother   . Drug abuse Brother   . Colon cancer Neg Hx   . Esophageal cancer Neg Hx   . Stomach cancer Neg Hx   . Breast cancer Neg Hx     PE: BP 120/90   Pulse 83   Ht 5\' 4"  (1.626 m)   Wt 201 lb (91.2 kg)   LMP  (LMP Unknown)   SpO2 97%   BMI 34.50 kg/m  Body mass index is 34.5 kg/m. Wt Readings from Last 3 Encounters:  07/03/19 201 lb (91.2 kg)  06/11/19 203 lb 12.8 oz (92.4 kg)  05/07/19 199 lb (90.3 kg)   Constitutional: overweight, in NAD Eyes: PERRLA, EOMI, no exophthalmos ENT: moist mucous membranes, no thyromegaly, no cervical  lymphadenopathy Cardiovascular: RRR, No MRG Respiratory: CTA B Gastrointestinal: abdomen soft, NT, ND, BS+ Musculoskeletal: no deformities, strength intact in all 4 Skin: moist, warm, no rashes Neurological: no tremor with outstretched hands, DTR normal in all 4  ASSESSMENT: 1. DM2, non-insulin dependent, without long-term complications, but with hyperglycemia  2. HL  3. H/o Low TSH  4.  Low vitamin D   5. Low B12  PLAN:  1. Patient with history of controlled type 2 diabetes, low-dose Metformin only, without side effects.  At last visit, sugars were still at goal, only slightly higher compared to before, but still well within the target range.  Since last visit, she had cervical spine surgery 04/2019.  She did gain weight since last visit and she also had increased stress with the loss of her mother and her brother last fall. -At this visit, sugars are all at goal, Therefore We will continue the current regimen -Reviewed latest HbA1c from 04/2019, which was at goal, at 6.5%, only slightly higher than before - I suggested to: Patient Instructions  Please continue: - Metformin ER 500  mg with dinner  Also, continue: - Ergocalciferol 50,000 units weekly - Crestor 5 mg daily - Zetia 10 mg daily - B12 injections monthly  Please stop at the lab.  Please return in 6 months with your sugar log.    - advised to check sugars at different times of the day - 1x a day, rotating check times - advised for yearly eye exams >> she is UTD - return to clinic in 6 months   2. HL -Reviewed latest lipid panel from last visit, LDL high, the rest of the lipid fractions at goal: Lab Results  Component Value Date   CHOL 224 (H) 01/02/2019   HDL 47.20 01/02/2019   LDLCALC 158 (H) 01/02/2019   TRIG 96.0 01/02/2019   CHOLHDL 5 01/02/2019  -At last visit she was on Livalo and Zetia but LDL was high so we switched to Crestor 5 -We will repeat her lipids today  3. Low TSH -No thyrotoxic  symptoms -This has resolved per review of her most recent TSH from 11/2018: Lab Results  Component Value Date   TSH 0.938 11/18/2018  -We will repeat TFTs at next visit  4. Low vitamin D  -She is not completely compliant with the 50,000 units of ergocalciferol weekly.  I again recommended that she took it every week.  However, she stopped it completely 2 months ago at least -Latest vitamin D level normal in 11/2018 -We will recheck the levels per her request  5. Low B12 -Latest B12 was normal in 11/2018 -On B12 injections, but not very compliant.  Previously on B12 p.o. 5000 mcg daily but she was forgetting these.  During the coronavirus pandemic, I advised her to resume this regimen until she can restart IM injections.  We gave her an IM injection at last visit. -She is off B12 injections now completely -We will recheck this today per her request  Component     Latest Ref Rng & Units 07/03/2019  Cholesterol     0 - 200 mg/dL 260 (H)  Triglycerides     0.0 - 149.0 mg/dL 91.0  HDL Cholesterol     >39.00 mg/dL 51.60  VLDL     0.0 - 40.0 mg/dL 18.2  LDL (calc)     0 - 99 mg/dL 190 (H)  Total CHOL/HDL Ratio      5  NonHDL      208.23  Vitamin B12     211 - 911 pg/mL 347  VITD     30.00 - 100.00 ng/mL 22.72 (L)   Vitamin D is low.  We will again advised her to restart her ergocalciferol. Her vitamin B12 is also close to the lower limit of normal.  I will advise her to restart monthly injections. LDL is very high.  We will increase the dose to 10 mg daily.  Philemon Kingdom, MD PhD [facility]

## 2019-07-03 NOTE — Patient Instructions (Signed)
Please continue: - Metformin ER 500 mg with dinner  Also, continue: - Crestor 5 mg daily - Zetia 10 mg daily  Please stop at the lab.  Please return in 6 months with your sugar log.

## 2019-07-06 MED FILL — ESTRADIOL 0.075 MG PATCH: 0.075 | 28 days supply | Qty: 8 | Fill #3

## 2019-07-24 ENCOUNTER — Other Ambulatory Visit: Payer: Self-pay

## 2019-07-24 ENCOUNTER — Encounter (HOSPITAL_BASED_OUTPATIENT_CLINIC_OR_DEPARTMENT_OTHER): Payer: Self-pay | Admitting: Orthopedic Surgery

## 2019-07-28 ENCOUNTER — Encounter (HOSPITAL_BASED_OUTPATIENT_CLINIC_OR_DEPARTMENT_OTHER)
Admission: RE | Admit: 2019-07-28 | Discharge: 2019-07-28 | Disposition: A | Discharge status: Home / Self Care | Payer: No Typology Code available for payment source | Source: Ambulatory Visit | Attending: Obstetrics & Gynecology | Admitting: Obstetrics & Gynecology

## 2019-07-28 DIAGNOSIS — Z01812 Encounter for preprocedural laboratory examination: Secondary | ICD-10-CM | POA: Diagnosis not present

## 2019-07-28 LAB — BASIC METABOLIC PANEL
Anion gap: 8 (ref 5–15)
BUN: 14 mg/dL (ref 6–20)
CO2: 24 mmol/L (ref 22–32)
Calcium: 9.6 mg/dL (ref 8.9–10.3)
Chloride: 107 mmol/L (ref 98–111)
Creatinine, Ser: 0.98 mg/dL (ref 0.44–1.00)
GFR calc Af Amer: >60 mL/min (ref >60)
GFR calc non Af Amer: >60 mL/min (ref >60)
Glucose, Bld: 93 mg/dL (ref 70–99)
Potassium: 4.3 mmol/L (ref 3.5–5.1)
Sodium: 139 mmol/L (ref 135–145)

## 2019-07-28 NOTE — Progress Notes (Signed)
      Enhanced Recovery after Surgery for Orthopedics Enhanced Recovery after Surgery is a protocol used to improve the stress on your body and your recovery after surgery.  Patient Instructions  . The night before surgery:  o No food after midnight. ONLY clear liquids after midnight  . The day of surgery (if you do NOT have diabetes):  o Drink ONE (1) Pre-Surgery Clear Ensure as directed.   o This drink was given to you during your hospital  pre-op appointment visit. o The pre-op nurse will instruct you on the time to drink the  Pre-Surgery Ensure depending on your surgery time. o Finish the drink at the designated time by the pre-op nurse.  o Nothing else to drink after completing the  Pre-Surgery Clear Ensure.  . The day of surgery (if you have diabetes): o Drink ONE (1) Gatorade 2 (G2) as directed. o This drink was given to you during your hospital  pre-op appointment visit.  o The pre-op nurse will instruct you on the time to drink the   Gatorade 2 (G2) depending on your surgery time. o Color of the Gatorade may vary. Red is not allowed. o Nothing else to drink after completing the  Gatorade 2 (G2).         If you have questions, please contact your surgeon's office.   Pt also given CHG body wash with instructions. Pt verbalized understanding

## 2019-07-29 ENCOUNTER — Other Ambulatory Visit: Payer: Self-pay

## 2019-07-29 MED ORDER — BLOOD GLUCOSE METER KIT: PACK | 0 refills | Status: DC

## 2019-07-30 ENCOUNTER — Other Ambulatory Visit (HOSPITAL_COMMUNITY)
Admission: RE | Admit: 2019-07-30 | Discharge: 2019-07-30 | Disposition: A | Discharge status: Home / Self Care | Payer: No Typology Code available for payment source | Source: Ambulatory Visit | Attending: Obstetrics & Gynecology | Admitting: Obstetrics & Gynecology

## 2019-07-30 DIAGNOSIS — Z01812 Encounter for preprocedural laboratory examination: Secondary | ICD-10-CM | POA: Diagnosis not present

## 2019-07-30 DIAGNOSIS — Z20822 Contact with and (suspected) exposure to covid-19: Secondary | ICD-10-CM | POA: Diagnosis not present

## 2019-07-30 LAB — SARS CORONAVIRUS 2 (TAT 6-24 HRS): SARS Coronavirus 2: NEGATIVE

## 2019-08-03 ENCOUNTER — Ambulatory Visit (HOSPITAL_BASED_OUTPATIENT_CLINIC_OR_DEPARTMENT_OTHER)
Admission: RE | Admit: 2019-08-03 | Discharge: 2019-08-03 | Disposition: A | Discharge status: Home / Self Care | Payer: No Typology Code available for payment source | Attending: Orthopedic Surgery | Admitting: Orthopedic Surgery

## 2019-08-03 ENCOUNTER — Ambulatory Visit (HOSPITAL_BASED_OUTPATIENT_CLINIC_OR_DEPARTMENT_OTHER): Payer: No Typology Code available for payment source | Admitting: Certified Registered"

## 2019-08-03 ENCOUNTER — Encounter (HOSPITAL_BASED_OUTPATIENT_CLINIC_OR_DEPARTMENT_OTHER)
Admission: RE | Disposition: A | Discharge status: Home / Self Care | Payer: Self-pay | Source: Home / Self Care | Attending: Orthopedic Surgery

## 2019-08-03 DIAGNOSIS — F329 Major depressive disorder, single episode, unspecified: Secondary | ICD-10-CM | POA: Diagnosis not present

## 2019-08-03 DIAGNOSIS — Z87891 Personal history of nicotine dependence: Secondary | ICD-10-CM | POA: Insufficient documentation

## 2019-08-03 DIAGNOSIS — Z811 Family history of alcohol abuse and dependence: Secondary | ICD-10-CM | POA: Insufficient documentation

## 2019-08-03 DIAGNOSIS — G709 Myoneural disorder, unspecified: Secondary | ICD-10-CM | POA: Insufficient documentation

## 2019-08-03 DIAGNOSIS — Z8489 Family history of other specified conditions: Secondary | ICD-10-CM | POA: Insufficient documentation

## 2019-08-03 DIAGNOSIS — F419 Anxiety disorder, unspecified: Secondary | ICD-10-CM | POA: Insufficient documentation

## 2019-08-03 DIAGNOSIS — Z832 Family history of diseases of the blood and blood-forming organs and certain disorders involving the immune mechanism: Secondary | ICD-10-CM | POA: Insufficient documentation

## 2019-08-03 DIAGNOSIS — Z981 Arthrodesis status: Secondary | ICD-10-CM | POA: Diagnosis not present

## 2019-08-03 DIAGNOSIS — G4730 Sleep apnea, unspecified: Secondary | ICD-10-CM | POA: Insufficient documentation

## 2019-08-03 DIAGNOSIS — Z8249 Family history of ischemic heart disease and other diseases of the circulatory system: Secondary | ICD-10-CM | POA: Diagnosis not present

## 2019-08-03 DIAGNOSIS — G4710 Hypersomnia, unspecified: Secondary | ICD-10-CM | POA: Diagnosis not present

## 2019-08-03 DIAGNOSIS — M7541 Impingement syndrome of right shoulder: Secondary | ICD-10-CM | POA: Insufficient documentation

## 2019-08-03 DIAGNOSIS — G43909 Migraine, unspecified, not intractable, without status migrainosus: Secondary | ICD-10-CM | POA: Insufficient documentation

## 2019-08-03 DIAGNOSIS — M94211 Chondromalacia, right shoulder: Secondary | ICD-10-CM | POA: Insufficient documentation

## 2019-08-03 DIAGNOSIS — Z90722 Acquired absence of ovaries, bilateral: Secondary | ICD-10-CM | POA: Insufficient documentation

## 2019-08-03 DIAGNOSIS — Z9049 Acquired absence of other specified parts of digestive tract: Secondary | ICD-10-CM | POA: Insufficient documentation

## 2019-08-03 DIAGNOSIS — Z882 Allergy status to sulfonamides status: Secondary | ICD-10-CM | POA: Diagnosis not present

## 2019-08-03 DIAGNOSIS — E119 Type 2 diabetes mellitus without complications: Secondary | ICD-10-CM | POA: Diagnosis not present

## 2019-08-03 DIAGNOSIS — K219 Gastro-esophageal reflux disease without esophagitis: Secondary | ICD-10-CM | POA: Diagnosis not present

## 2019-08-03 DIAGNOSIS — Z79899 Other long term (current) drug therapy: Secondary | ICD-10-CM | POA: Insufficient documentation

## 2019-08-03 DIAGNOSIS — M75111 Incomplete rotator cuff tear or rupture of right shoulder, not specified as traumatic: Secondary | ICD-10-CM | POA: Insufficient documentation

## 2019-08-03 DIAGNOSIS — M1990 Unspecified osteoarthritis, unspecified site: Secondary | ICD-10-CM | POA: Insufficient documentation

## 2019-08-03 DIAGNOSIS — I10 Essential (primary) hypertension: Secondary | ICD-10-CM | POA: Insufficient documentation

## 2019-08-03 DIAGNOSIS — Z90710 Acquired absence of both cervix and uterus: Secondary | ICD-10-CM | POA: Diagnosis not present

## 2019-08-03 DIAGNOSIS — E785 Hyperlipidemia, unspecified: Secondary | ICD-10-CM | POA: Diagnosis not present

## 2019-08-03 DIAGNOSIS — Z7984 Long term (current) use of oral hypoglycemic drugs: Secondary | ICD-10-CM | POA: Insufficient documentation

## 2019-08-03 HISTORY — PX: SHOULDER ARTHROSCOPY WITH ROTATOR CUFF REPAIR: SHX5685

## 2019-08-03 LAB — GLUCOSE, CAPILLARY
Glucose-Capillary: 73 mg/dL (ref 70–99)
Glucose-Capillary: 99 mg/dL (ref 70–99)

## 2019-08-03 SURGERY — ARTHROSCOPY, SHOULDER, WITH ROTATOR CUFF REPAIR
Anesthesia: General | Site: Shoulder | Laterality: Right

## 2019-08-03 MED ORDER — LACTATED RINGERS IV SOLN: INTRAVENOUS | Status: DC

## 2019-08-03 MED ORDER — ONDANSETRON HCL 4 MG/2ML IJ SOLN
INTRAMUSCULAR | Status: AC
  Filled 2019-08-03: qty 2

## 2019-08-03 MED ORDER — FENTANYL CITRATE (PF) 100 MCG/2ML IJ SOLN
50.0000 ug | INTRAMUSCULAR | Status: DC | PRN
  Administered 2019-08-03: 50 ug via INTRAVENOUS

## 2019-08-03 MED ORDER — CEFAZOLIN SODIUM-DEXTROSE 2-4 GM/100ML-% IV SOLN
2.0000 g | INTRAVENOUS | Status: AC
  Administered 2019-08-03: 2 g via INTRAVENOUS

## 2019-08-03 MED ORDER — SUGAMMADEX SODIUM 200 MG/2ML IV SOLN
INTRAVENOUS | Status: DC | PRN
  Administered 2019-08-03: 200 mg via INTRAVENOUS

## 2019-08-03 MED ORDER — BUPIVACAINE LIPOSOME 1.3 % IJ SUSP
INTRAMUSCULAR | Status: DC | PRN
Start: 2019-08-03 — End: 2019-08-03
  Administered 2019-08-03: 10 mL via PERINEURAL

## 2019-08-03 MED ORDER — ROCURONIUM BROMIDE 10 MG/ML (PF) SYRINGE
PREFILLED_SYRINGE | INTRAVENOUS | Status: AC
  Filled 2019-08-03: qty 10

## 2019-08-03 MED ORDER — ROCURONIUM BROMIDE 100 MG/10ML IV SOLN
INTRAVENOUS | Status: DC | PRN
  Administered 2019-08-03: 60 mg via INTRAVENOUS

## 2019-08-03 MED ORDER — SCOPOLAMINE 1 MG/3DAYS TD PT72
1.0000 | MEDICATED_PATCH | TRANSDERMAL | Status: DC
  Administered 2019-08-03: 1.5 mg via TRANSDERMAL

## 2019-08-03 MED ORDER — SCOPOLAMINE 1 MG/3DAYS TD PT72
MEDICATED_PATCH | TRANSDERMAL | Status: AC
  Filled 2019-08-03: qty 1

## 2019-08-03 MED ORDER — MIDAZOLAM HCL 2 MG/2ML IJ SOLN
INTRAMUSCULAR | Status: AC
  Filled 2019-08-03: qty 2

## 2019-08-03 MED ORDER — FENTANYL CITRATE (PF) 100 MCG/2ML IJ SOLN
INTRAMUSCULAR | Status: DC | PRN
  Administered 2019-08-03: 100 ug via INTRAVENOUS

## 2019-08-03 MED ORDER — MIDAZOLAM HCL 2 MG/2ML IJ SOLN
1.0000 mg | INTRAMUSCULAR | Status: DC | PRN
  Administered 2019-08-03: 1 mg via INTRAVENOUS

## 2019-08-03 MED ORDER — CEFAZOLIN SODIUM-DEXTROSE 2-4 GM/100ML-% IV SOLN
INTRAVENOUS | Status: AC
  Filled 2019-08-03: qty 100

## 2019-08-03 MED ORDER — LIDOCAINE 2% (20 MG/ML) 5 ML SYRINGE
INTRAMUSCULAR | Status: DC | PRN
  Administered 2019-08-03: 20 mg via INTRAVENOUS

## 2019-08-03 MED ORDER — FENTANYL CITRATE (PF) 100 MCG/2ML IJ SOLN
INTRAMUSCULAR | Status: AC
  Filled 2019-08-03: qty 2

## 2019-08-03 MED ORDER — PROPOFOL 10 MG/ML IV BOLUS
INTRAVENOUS | Status: AC
  Filled 2019-08-03: qty 20

## 2019-08-03 MED ORDER — LIDOCAINE 2% (20 MG/ML) 5 ML SYRINGE
INTRAMUSCULAR | Status: AC
  Filled 2019-08-03: qty 5

## 2019-08-03 MED ORDER — ROPIVACAINE HCL 5 MG/ML IJ SOLN
INTRAMUSCULAR | Status: DC | PRN
  Administered 2019-08-03: 15 mL via PERINEURAL

## 2019-08-03 MED ORDER — PHENYLEPHRINE HCL (PRESSORS) 10 MG/ML IV SOLN
INTRAVENOUS | Status: DC | PRN
  Administered 2019-08-03: 80 ug via INTRAVENOUS
  Administered 2019-08-03: 40 ug via INTRAVENOUS

## 2019-08-03 MED ORDER — PROPOFOL 10 MG/ML IV BOLUS
INTRAVENOUS | Status: DC | PRN
  Administered 2019-08-03: 150 mg via INTRAVENOUS

## 2019-08-03 MED ORDER — DEXAMETHASONE SODIUM PHOSPHATE 10 MG/ML IJ SOLN
INTRAMUSCULAR | Status: DC | PRN
  Administered 2019-08-03: 5 mg via INTRAVENOUS

## 2019-08-03 MED FILL — tiZANidine HCL 2 MG TABS: 2 | 7 days supply | Qty: 20 | Fill #0

## 2019-08-03 MED FILL — OXYCODONE-ACETAMINOPHEN 5-3: 5-325 | 5 days supply | Qty: 30 | Fill #0

## 2019-08-03 SURGICAL SUPPLY — 53 items
ADH SKN CLS APL DERMABOND .7 (GAUZE/BANDAGES/DRESSINGS)
AID PSTN UNV HD RSTRNT DISP (MISCELLANEOUS) ×1
APL PRP STRL LF DISP 70% ISPRP (MISCELLANEOUS) ×1
BURR OVAL 8 FLU 4.0X13 (MISCELLANEOUS) ×2 IMPLANT
CANNULA 5.75X7 CRYSTAL CLEAR (CANNULA) ×2 IMPLANT
CANNULA TWIST IN 8.25X7CM (CANNULA) ×1 IMPLANT
CHLORAPREP W/TINT 26 (MISCELLANEOUS) ×2 IMPLANT
COVER WAND RF STERILE (DRAPES) IMPLANT
CUTTER BONE 4.0MM X 13CM (MISCELLANEOUS) ×2 IMPLANT
DECANTER SPIKE VIAL GLASS SM (MISCELLANEOUS) IMPLANT
DERMABOND ADVANCED (GAUZE/BANDAGES/DRESSINGS)
DERMABOND ADVANCED .7 DNX12 (GAUZE/BANDAGES/DRESSINGS) IMPLANT
DRAPE IMP U-DRAPE 54X76 (DRAPES) ×2 IMPLANT
DRAPE INCISE IOBAN 66X45 STRL (DRAPES) ×2 IMPLANT
DRAPE STERI 35X30 U-POUCH (DRAPES) ×2 IMPLANT
DRAPE SURG 17X23 STRL (DRAPES) ×2 IMPLANT
DRAPE U-SHAPE 47X51 STRL (DRAPES) ×2 IMPLANT
DRAPE U-SHAPE 76X120 STRL (DRAPES) ×4 IMPLANT
DRSG PAD ABDOMINAL 8X10 ST (GAUZE/BANDAGES/DRESSINGS) ×2 IMPLANT
GAUZE SPONGE 4X4 12PLY STRL (GAUZE/BANDAGES/DRESSINGS) ×2 IMPLANT
GAUZE XEROFORM 1X8 LF (GAUZE/BANDAGES/DRESSINGS) ×2 IMPLANT
GLOVE BIO SURGEON STRL SZ7 (GLOVE) ×3 IMPLANT
GLOVE BIO SURGEON STRL SZ7.5 (GLOVE) ×2 IMPLANT
GLOVE BIOGEL PI IND STRL 7.0 (GLOVE) ×1 IMPLANT
GLOVE BIOGEL PI IND STRL 8 (GLOVE) ×1 IMPLANT
GLOVE BIOGEL PI INDICATOR 7.0 (GLOVE) ×3
GLOVE BIOGEL PI INDICATOR 8 (GLOVE) ×1
GOWN STRL REUS W/ TWL LRG LVL3 (GOWN DISPOSABLE) ×2 IMPLANT
GOWN STRL REUS W/ TWL XL LVL3 (GOWN DISPOSABLE) ×1 IMPLANT
GOWN STRL REUS W/TWL LRG LVL3 (GOWN DISPOSABLE) ×4
GOWN STRL REUS W/TWL XL LVL3 (GOWN DISPOSABLE) ×2
LASSO CRESCENT QUICKPASS (SUTURE) IMPLANT
MANIFOLD NEPTUNE II (INSTRUMENTS) ×2 IMPLANT
NDL SCORPION MULTI FIRE (NEEDLE) IMPLANT
NEEDLE SCORPION MULTI FIRE (NEEDLE) IMPLANT
NS IRRIG 1000ML POUR BTL (IV SOLUTION) IMPLANT
PACK DSU ARTHROSCOPY (CUSTOM PROCEDURE TRAY) ×2 IMPLANT
PROBE BIPOLAR ATHRO 135MM 90D (MISCELLANEOUS) ×2 IMPLANT
RESTRAINT HEAD UNIVERSAL NS (MISCELLANEOUS) ×2 IMPLANT
SET BASIN DAY SURGERY F.S. (CUSTOM PROCEDURE TRAY) ×2 IMPLANT
SLEEVE SCD COMPRESS KNEE MED (MISCELLANEOUS) ×2 IMPLANT
SLING ARM FOAM STRAP LRG (SOFTGOODS) ×1 IMPLANT
SUPPORT WRAP ARM LG (MISCELLANEOUS) IMPLANT
SUT ETHILON 3 0 PS 1 (SUTURE) ×2 IMPLANT
SUT FIBERWIRE #2 38 T-5 BLUE (SUTURE)
SUT PDS AB 0 CT 36 (SUTURE) IMPLANT
SUT TIGER TAPE 7 IN WHITE (SUTURE) IMPLANT
SUTURE FIBERWR #2 38 T-5 BLUE (SUTURE) IMPLANT
TAPE FIBER 2MM 7IN #2 BLUE (SUTURE) IMPLANT
TOWEL GREEN STERILE FF (TOWEL DISPOSABLE) ×2 IMPLANT
TUBE CONNECTING 20X1/4 (TUBING) ×2 IMPLANT
TUBING ARTHROSCOPY IRRIG 16FT (MISCELLANEOUS) ×2 IMPLANT
WATER STERILE IRR 1000ML POUR (IV SOLUTION) ×2 IMPLANT

## 2019-08-03 NOTE — Op Note (Signed)
Procedure(s): SHOULDER ARTHROSCOPY WITH. ROTATOR CUFF DEBRIDEMENT, SUBACROMIAL DECOMPRESSION, DISTAL CLAVICLE EXCISION Procedure Note  Leksi Hertzberg [place] female 59 y.o. 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 ROTATOR CUFF DEBRIDEMENT, SUBACROMIAL DECOMPRESSION, DISTAL CLAVICLE EXCISION - General  Surgeon(s) and Role:    Tania Ade, MD - Primary     Surgeon: Isabella Stalling   Assistants: Jeanmarie Hubert PA-C [facility] was present and scrubbed throughout the procedure and was essential in positioning, assisting with the camera and instrumentation,, and closure)  Anesthesia: General endotracheal anesthesia with preoperative interscalene block given by the attending anesthesiologist    Procedure Detail  SHOULDER ARTHROSCOPY WITH. ROTATOR CUFF DEBRIDEMENT, SUBACROMIAL DECOMPRESSION, DISTAL CLAVICLE EXCISION  Estimated Blood Loss: Min         Drains: none  Blood Given: none         Specimens: none        Complications:  * No complications entered in OR log *         Disposition: PACU - hemodynamically stable.         Condition: stable    Procedure:   INDICATIONS FOR SURGERY: The patient is 59 y.o. female who has a long history of right shoulder pain which is failed conservative management.  Indicated for surgical treatment to try to decrease pain and restore function.  OPERATIVE FINDINGS: Examination under anesthesia: No stiffness or instability   DESCRIPTION OF PROCEDURE: The patient was identified in preoperative  holding area where I personally marked the operative site after  verifying site, side, and procedure with the patient. An interscalene block was given by the attending  anesthesiologist the holding area.  The patient was taken back to the operating room where general anesthesia was induced without complication and was placed in the beach-chair position with the back  elevated about 60 degrees and all extremities and head and neck carefully padded and  positioned.   The right upper extremity was then prepped and  draped in a standard sterile fashion. The appropriate time-out  procedure was carried out. The patient did receive IV antibiotics  within 30 minutes of incision.   A small posterior portal incision was made and the arthroscope was introduced into the joint. An anterior portal was then established above the subscapularis using needle localization. Small cannula was placed anteriorly. Diagnostic arthroscopy was then carried out .  The subscapularis was noted to be completely intact.  There was not significant synovitis in the joint.  Glenohumeral joint surfaces looked good with only a small area of chondromalacia on the anterior inferior glenoid which is debrided back to stable shoulders.  She also had partial tearing of the anterior labrum with a few small flaps which were debrided back to stable labrum.  The superior labrum and biceps tendon were intact.  The supraspinatus tendon had some partial tearing involving 15 to 20% of the articular surface.  This was debrided back to stable healthy tendon.  The infraspinatus was intact.  The arthroscope was then introduced into the subacromial space a standard lateral portal was established with needle localization. The shaver was used through the lateral portal to perform extensive bursectomy. Coracoacromial ligament was examined and found to be frayed indicating chronic impingement.  The bursal surface of the rotator cuff was carefully examined and noted to  be completely intact.  The coracoacromial ligament was taken down off the anterior acromion with the ArthroCare exposing a moderate hooked anterior acromial  spur. A high-speed bur was then used through the lateral portal to take down the anterior acromial spur from lateral to medial in a standard acromioplasty.  The acromioplasty was also viewed from the lateral portal and the bur was used as necessary to ensure that the acromion was completely flat from posterior to anterior.  The distal clavicle was exposed arthroscopically and the bur was used to take off the undersurface for approximately 8 mm from the lateral portal. The bur was then moved to an anterior portal position to complete the distal clavicle excision resecting about 8 mm of the distal clavicle and a smooth even fashion. This was viewed from anterior and lateral portals and felt to be complete.  The arthroscopic equipment was removed from the joint and the portals were closed with 3-0 nylon in an interrupted fashion. Sterile dressings were then applied including Xeroform 4 x 4's ABDs and tape. The patient was then allowed to awaken from general anesthesia, placed in a sling, transferred to the stretcher and taken to the recovery room in stable condition.   POSTOPERATIVE PLAN: The patient will be discharged home today and will followup in one week for suture removal and wound check.

## 2019-08-03 NOTE — Anesthesia Postprocedure Evaluation (Signed)
Anesthesia Post Note  Patient: [name]  Procedure(s) Performed: SHOULDER ARTHROSCOPY WITH. ROTATOR CUFF DEBRIDEMENT, SUBACROMIAL DECOMPRESSION, DISTAL CLAVICLE EXCISION (Right Shoulder)     Patient location during evaluation: PACU Anesthesia Type: General and Regional Level of consciousness: awake and alert, oriented and patient cooperative Pain management: pain level controlled Vital Signs Assessment: post-procedure vital signs reviewed and stable Respiratory status: spontaneous breathing, nonlabored ventilation and respiratory function stable Cardiovascular status: blood pressure returned to baseline and stable Postop Assessment: no apparent nausea or vomiting Anesthetic complications: no    Last Vitals:  Vitals:   08/03/19 1400 08/03/19 1415  BP: (!) 131/91 118/80  Pulse: 92 88  Resp: 14 20  Temp: (!) 36.4 C   SpO2: 99% 100%    Last Pain:  Vitals:   08/03/19 1415  TempSrc:   PainSc: 0-No pain                 Pervis Hocking

## 2019-08-03 NOTE — Anesthesia Preprocedure Evaluation (Signed)
Anesthesia Evaluation  Patient identified by MRN, date of birth, ID band Patient awake    Reviewed: Allergy & Precautions, H&P , NPO status , Patient's Chart, lab work & pertinent test results  History of Anesthesia Complications (+) PONV and history of anesthetic complications  Airway Mallampati: II   Neck ROM: full    Dental   Pulmonary sleep apnea , former smoker,    breath sounds clear to auscultation       Cardiovascular hypertension,  Rhythm:regular Rate:Normal     Neuro/Psych  Headaches, PSYCHIATRIC DISORDERS Anxiety Depression  Neuromuscular disease    GI/Hepatic GERD  ,  Endo/Other  diabetes, Type 2  Renal/GU      Musculoskeletal  (+) Arthritis ,   Abdominal   Peds  Hematology   Anesthesia Other Findings   Reproductive/Obstetrics                             Anesthesia Physical Anesthesia Plan  ASA: II  Anesthesia Plan: General   Post-op Pain Management:  Regional for Post-op pain   Induction: Intravenous  PONV Risk Score and Plan: 4 or greater and Ondansetron, Treatment may vary due to age or medical condition, Dexamethasone and Midazolam  Airway Management Planned: Oral ETT  Additional Equipment:   Intra-op Plan:   Post-operative Plan: Extubation in OR  Informed Consent: I have reviewed the patients History and Physical, chart, labs and discussed the procedure including the risks, benefits and alternatives for the proposed anesthesia with the patient or authorized representative who has indicated his/her understanding and acceptance.       Plan Discussed with: CRNA, Anesthesiologist and Surgeon  Anesthesia Plan Comments:         Anesthesia Quick Evaluation

## 2019-08-03 NOTE — Anesthesia Procedure Notes (Signed)
Procedure Name: Intubation Date/Time: 08/03/2019 1:06 PM Performed by: Myna Bright, CRNA Pre-anesthesia Checklist: Patient identified, Emergency Drugs available, Suction available and Patient being monitored Patient Re-evaluated:Patient Re-evaluated prior to induction Oxygen Delivery Method: Circle system utilized Preoxygenation: Pre-oxygenation with 100% oxygen Induction Type: IV induction Ventilation: Mask ventilation without difficulty Laryngoscope Size: Mac and 3 Grade View: Grade II Tube type: Oral Tube size: 7.0 mm Number of attempts: 1 Airway Equipment and Method: Stylet Placement Confirmation: ETT inserted through vocal cords under direct vision Secured at: 21 cm Tube secured with: Tape Dental Injury: Teeth and Oropharynx as per pre-operative assessment

## 2019-08-03 NOTE — Transfer of Care (Addendum)
Immediate Anesthesia Transfer of Care Note  Patient: [name]  Procedure(s) Performed: SHOULDER ARTHROSCOPY WITH. ROTATOR CUFF DEBRIDEMENT, SUBACROMIAL DECOMPRESSION, DISTAL CLAVICLE EXCISION (Right Shoulder)  Patient Location: PACU  Anesthesia Type:General and Regional  Level of Consciousness: awake, alert  and oriented  Airway & Oxygen Therapy: Patient Spontanous Breathing  Post-op Assessment: Report given to RN  Post vital signs: Reviewed and stable  Last Vitals:  Vitals Value Taken Time  BP 123/80 08/03/19 1430  Temp 36.4 C 08/03/19 1400  Pulse 82 08/03/19 1433  Resp 17 08/03/19 1433  SpO2 95 % 08/03/19 1433  Vitals shown include unvalidated device data.  Last Pain:  Vitals:   08/03/19 1415  TempSrc:   PainSc: 0-No pain         Complications: No apparent anesthesia complications

## 2019-08-03 NOTE — Progress Notes (Signed)
Assisted Dr. Hodierne with right, ultrasound guided, interscalene  block. Side rails up, monitors on throughout procedure. See vital signs in flow sheet. Tolerated Procedure well. 

## 2019-08-03 NOTE — H&P (Signed)
[name] is an 59 y.o. female.   Chief Complaint: R shoulder pain and dysfunction HPI: R shoulder pain and dysfunction, with partial RCT and AC joint arthropathy.  failed conservative treatment.  Past Medical History:  Diagnosis Date  . Anxiety   . 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 Brain tumor (benign) ([place]) 2018  . Chest pain 05/15/2015  . Complication of anesthesia   . Depression    in the past  . Diabetes mellitus without complication ([place])    Type II  . GERD (gastroesophageal reflux disease)    04/30/2019- "in the past"  . H/O varicella   . History of measles, mumps, or rubella   . HSV-2 (herpes simplex virus 2) infection   . Hx of ovarian cyst   . Hyperlipidemia   . Hypersomnia 07/19/2015  . Hypertension    "sometimes"   . Migraine headache    "due to cervical issuses"  . Plantar fasciitis   . PONV (postoperative nausea and vomiting)   . Scapular dyskinesis 10/03/2016  . Sleep apnea    pt denies  . Sleep deprivation    loss of sleep  . VAIN I (vaginal intraepithelial neoplasia grade I) 2013   pap and confirmed by colposcopic biopsy    Past Surgical History:  Procedure Laterality Date  . ABDOMINAL HYSTERECTOMY    . ANTERIOR CERVICAL DECOMP/DISCECTOMY FUSION N/A 05/07/2019   Procedure: ANTERIOR CERVICAL DECOMPRESSION FUSION CERVICAL 5-6, CERVICAL 6-7 WITH INSTRUMENTATION AND ALLOGRAFT;  Surgeon: Phylliss Bob, MD;  Location: [facility];  Service: Orthopedics;  Laterality: N/A;  . CHOLECYSTECTOMY  11/13/10  . COLONOSCOPY  Multiple  . DILATION AND CURETTAGE OF UTERUS    . ESOPHAGOGASTRODUODENOSCOPY    . OOPHORECTOMY  2009   bilateral salpingo-oophorectomy.  . TUBAL LIGATION      Family History  Problem Relation Age of Onset  . Hypertension Mother   . Hypotension Mother   . Anemia Mother        low iron  . Heart disease Sister   . Sickle cell trait Other   . Alcohol abuse Brother   . Alcohol abuse Brother   . Aneurysm Brother   . Drug  abuse Brother   . Colon cancer Neg Hx   . Esophageal cancer Neg Hx   . Stomach cancer Neg Hx   . Breast cancer Neg Hx    Social History:  reports that she quit smoking about 9 years ago. Her smoking use included cigarettes. She has a 15.00 pack-year smoking history. She has never used smokeless tobacco. She reports that she does not drink alcohol or use drugs.  Allergies:  Allergies  Allergen Reactions  . Sulfa Antibiotics Anaphylaxis, Hives and Swelling    Medications Prior to Admission  Medication Sig Dispense Refill  . cyclobenzaprine (FLEXERIL) 10 MG tablet Take 10 mg by mouth at bedtime.    Marland Kitchen estradiol (VIVELLE-DOT) 0.075 MG/24HR Place 1 patch onto the skin 2 (two) times a week. 8 patch 11  . ezetimibe (ZETIA) 10 MG tablet Take 1 tablet (10 mg total) by mouth daily. 90 tablet 3  . metFORMIN (GLUCOPHAGE-XR) 500 MG 24 hr tablet TAKE 1 TABLET BY MOUTH DAILY WITH A MEAL. (Patient taking differently: Take 500 mg by mouth daily with breakfast. ) 180 tablet 3  . rosuvastatin (CRESTOR) 10 MG tablet Take 1 tablet (10 mg total) by mouth daily. 90 tablet 3  . triamcinolone (KENALOG) 0.025 % ointment Apply 1  application topically 2 (two) times daily. 30 g 0  . blood glucose meter kit and supplies Dispense based on patient and insurance preference. Use once a day as directed. (FOR ICD-10 E10.9, E11.9). 1 each 0  . Elastic Bandages & Supports (WRIST BRACE/LEFT MEDIUM) MISC 1 each by Does not apply route as needed. 1 each 0  . methocarbamol (ROBAXIN) 500 MG tablet Take 500 mg by mouth 2 (two) times daily at 10 AM and 5 PM.    . topiramate (TOPAMAX) 50 MG tablet Take 1 tablet (50 mg total) by mouth 2 (two) times daily. 60 tablet 5    Results for orders placed or performed during the hospital encounter of 08/03/19 (from the past 48 hour(s))  Glucose, capillary     Status: None   Collection Time: 08/03/19 11:23 AM  Result Value Ref Range   Glucose-Capillary 73 70 - 99 mg/dL    Comment: Glucose  reference range applies only to samples taken after fasting for at least 8 hours.   No results found.  Review of Systems  All other systems reviewed and are negative.   Blood pressure 107/76, pulse 73, temperature (!) 97.1 F (36.2 C), temperature source Temporal, resp. rate 20, height 5' 5"  (1.651 m), weight 93 kg, SpO2 100 %. Physical Exam  Constitutional: She is oriented to person, place, and time. She appears well-developed and well-nourished.  HENT:  Head: Atraumatic.  Eyes: EOM are normal.  Cardiovascular: Intact distal pulses.  Respiratory: Effort normal.  Musculoskeletal:     Comments: R shoulder pain with impingement testing.  TTP at [facility] joint  Neurological: She is alert and oriented to person, place, and time.  Skin: Skin is warm and dry.  Psychiatric: She has a normal mood and affect.     Assessment/Plan R shoulder pain and dysfunction, with partial RCT and AC joint arthropathy.  failed conservative treatment. Plan R arthr RCR vs RC debride, SAD, DCE Risks / benefits of surgery discussed Consent on chart  NPO for OR Preop antibiotics   Isabella Stalling, MD 08/03/2019, 12:54 PM

## 2019-08-03 NOTE — Anesthesia Procedure Notes (Signed)
Anesthesia Regional Block: Interscalene brachial plexus block   Pre-Anesthetic Checklist: ,, timeout performed, Correct Patient, Correct Site, Correct Laterality, Correct Procedure, Correct Position, site marked, Risks and benefits discussed,  Surgical consent,  Pre-op evaluation,  At surgeon's request and post-op pain management  Laterality: Right  Prep: chloraprep       Needles:  Injection technique: Single-shot  Needle Type: Echogenic Stimulator Needle     Needle Length: 5cm  Needle Gauge: 22     Additional Needles:   Procedures:, nerve stimulator,,,,,,,   Nerve Stimulator or Paresthesia:  Response: biceps flexion, 0.45 mA,   Additional Responses:   Narrative:  Start time: 08/03/2019 12:03 PM End time: 08/03/2019 12:12 PM Injection made incrementally with aspirations every 5 mL.  Performed by: Personally  Anesthesiologist: Albertha Ghee, MD  Additional Notes: Functioning IV was confirmed and monitors were applied.  A 61mm 22ga Arrow echogenic stimulator needle was used. Sterile prep and drape,hand hygiene and sterile gloves were used.  Negative aspiration and negative test dose prior to incremental administration of local anesthetic. The patient tolerated the procedure well.  Ultrasound guidance: relevent anatomy identified, needle position confirmed, local anesthetic spread visualized around nerve(s), vascular puncture avoided.  Image printed for medical record.

## 2019-08-03 NOTE — Discharge Instructions (Signed)
Discharge Instructions after Arthroscopic Shoulder Surgery   A sling has been provided for you. You may remove the sling after 72 hours. The sling may be worn for your protection, if you are in a crowd.  Use ice on the shoulder intermittently over the first 48 hours after surgery.  Pain medication has been prescribed for you.  Use your medication liberally over the first 48 hours, and then begin to taper your use. You may take Extra Strength Tylenol or Tylenol only in place of the pain pills. DO NOT take ANY nonsteroidal anti-inflammatory pain medications: Advil, Motrin, Ibuprofen, Aleve, Naproxen, or Naprosyn.  You may remove your dressing after two days.  You may shower 5 days after surgery. The incision CANNOT get wet prior to 5 days. Simply allow the water to wash over the site and then pat dry. Do not rub the incision. Make sure your axilla (armpit) is completely dry after showering.  Take one aspirin a day for 2 weeks after surgery, unless you have an aspirin sensitivity/allergy or asthma.  Three to 5 times each day you should perform assisted overhead reaching and external rotation (outward turning) exercises with the operative arm. Both exercises should be done with the non-operative arm used as the "therapist arm" while the operative arm remains relaxed. Ten of each exercise should be done three to five times each day.    Overhead reach is helping to lift your stiff arm up as high as it will go. To stretch your overhead reach, lie flat on your back, relax, and grasp the wrist of the tight shoulder with your opposite hand. Using the power in your opposite arm, bring the stiff arm up as far as it is comfortable. Start holding it for ten seconds and then work up to where you can hold it for a count of 30. Breathe slowly and deeply while the arm is moved. Repeat this stretch ten times, trying to help the arm up a little higher each time.       External rotation is turning the arm out to  the side while your elbow stays close to your body. External rotation is best stretched while you are lying on your back. Hold a cane, yardstick, broom handle, or dowel in both hands. Bend both elbows to a right angle. Use steady, gentle force from your normal arm to rotate the hand of the stiff shoulder out away from your body. Continue the rotation as far as it will go comfortably, holding it there for a count of 10. Repeat this exercise ten times.     Please call [phone] during normal business hours or [phone] after hours for any problems. Including the following:  - excessive redness of the incisions - drainage for more than 4 days - fever of more than 101.5 F  *Please note that pain medications will not be refilled after hours or on weekends.    Post Anesthesia Home Care Instructions  Activity: Get plenty of rest for the remainder of the day. A responsible individual must stay with you for 24 hours following the procedure.  For the next 24 hours, DO NOT: -Drive a car -Operate machinery -Drink alcoholic beverages -Take any medication unless instructed by your physician -Make any legal decisions or sign important papers.  Meals: Start with liquid foods such as gelatin or soup. Progress to regular foods as tolerated. Avoid greasy, spicy, heavy foods. If nausea and/or vomiting occur, drink only clear liquids until the nausea and/or vomiting subsides. Call   physician if vomiting continues.  Special Instructions/Symptoms: Your throat may feel dry or sore from the anesthesia or the breathing tube placed in your throat during surgery. If this causes discomfort, gargle with warm salt water. The discomfort should disappear within 24 hours.  If you had a scopolamine patch placed behind your ear for the management of post- operative nausea and/or vomiting:  1. The medication in the patch is effective for 72 hours, after which it should be removed.  Wrap patch in a tissue and  discard in the trash. Wash hands thoroughly with soap and water. 2. You may remove the patch earlier than 72 hours if you experience unpleasant side effects which may include dry mouth, dizziness or visual disturbances. 3. Avoid touching the patch. Wash your hands with soap and water after contact with the patch.    Regional Anesthesia Blocks  1. Numbness or the inability to move the "blocked" extremity may last from 3-48 hours after placement. The length of time depends on the medication injected and your individual response to the medication. If the numbness is not going away after 48 hours, call your surgeon.  2. The extremity that is blocked will need to be protected until the numbness is gone and the  Strength has returned. Because you cannot feel it, you will need to take extra care to avoid injury. Because it may be weak, you may have difficulty moving it or using it. You may not know what position it is in without looking at it while the block is in effect.  3. For blocks in the legs and feet, returning to weight bearing and walking needs to be done carefully. You will need to wait until the numbness is entirely gone and the strength has returned. You should be able to move your leg and foot normally before you try and bear weight or walk. You will need someone to be with you when you first try to ensure you do not fall and possibly risk injury.  4. Bruising and tenderness at the needle site are common side effects and will resolve in a few days.  5. Persistent numbness or new problems with movement should be communicated to the surgeon or the [facility] ([phone])/ [facility] ([phone]).Information for Discharge Teaching: EXPAREL (bupivacaine liposome injectable suspension)   Your surgeon or anesthesiologist gave you EXPAREL(bupivacaine) to help control your pain after surgery.   EXPAREL is a local anesthetic that provides pain relief by numbing the  tissue around the surgical site.  EXPAREL is designed to release pain medication over time and can control pain for up to 72 hours.  Depending on how you respond to EXPAREL, you may require less pain medication during your recovery.  Possible side effects:  Temporary loss of sensation or ability to move in the area where bupivacaine was injected.  Nausea, vomiting, constipation  Rarely, numbness and tingling in your mouth or lips, lightheadedness, or anxiety may occur.  Call your doctor right away if you think you may be experiencing any of these sensations, or if you have other questions regarding possible side effects.  Follow all other discharge instructions given to you by your surgeon or nurse. Eat a healthy diet and drink plenty of water or other fluids.  If you return to the hospital for any reason within 96 hours following the administration of EXPAREL, it is important for health care providers to know that you have received this anesthetic. A teal colored band has   been placed on your arm with the date, time and amount of EXPAREL you have received in order to alert and inform your health care providers. Please leave this armband in place for the full 96 hours following administration, and then you may remove the band.    

## 2019-08-05 ENCOUNTER — Encounter: Payer: Self-pay | Admitting: *Deleted

## 2019-08-21 MED FILL — MELOXICAM 15 MG TABLET: 15 | 30 days supply | Qty: 30 | Fill #0

## 2019-09-11 ENCOUNTER — Ambulatory Visit: Payer: No Typology Code available for payment source | Admitting: Family Medicine

## 2019-09-15 ENCOUNTER — Encounter: Payer: Self-pay | Admitting: Family Medicine

## 2019-09-15 ENCOUNTER — Other Ambulatory Visit: Payer: Self-pay

## 2019-09-15 ENCOUNTER — Ambulatory Visit (INDEPENDENT_AMBULATORY_CARE_PROVIDER_SITE_OTHER): Payer: No Typology Code available for payment source | Admitting: Family Medicine

## 2019-09-15 VITALS — BP 134/78 | HR 80 | Ht 64.0 in | Wt 204.0 lb

## 2019-09-15 DIAGNOSIS — R300 Dysuria: Secondary | ICD-10-CM | POA: Insufficient documentation

## 2019-09-15 DIAGNOSIS — E1165 Type 2 diabetes mellitus with hyperglycemia: Secondary | ICD-10-CM

## 2019-09-15 DIAGNOSIS — C719 Malignant neoplasm of brain, unspecified: Secondary | ICD-10-CM

## 2019-09-15 DIAGNOSIS — N890 Mild vaginal dysplasia: Secondary | ICD-10-CM

## 2019-09-15 DIAGNOSIS — F331 Major depressive disorder, recurrent, moderate: Secondary | ICD-10-CM

## 2019-09-15 LAB — POCT URINALYSIS DIP (MANUAL ENTRY)
Bilirubin, UA: NEGATIVE
Glucose, UA: NEGATIVE mg/dL
Ketones, POC UA: NEGATIVE mg/dL
Nitrite, UA: POSITIVE — AB
Protein Ur, POC: NEGATIVE mg/dL
Spec Grav, UA: 1.02 (ref 1.010–1.025)
Urobilinogen, UA: 0.2 E.U./dL
pH, UA: 7 (ref 5.0–8.0)

## 2019-09-15 LAB — POCT GLYCOSYLATED HEMOGLOBIN (HGB A1C): HbA1c, POC (controlled diabetic range): 6.1 % (ref 0.0–7.0)

## 2019-09-15 MED ORDER — CEPHALEXIN 500 MG PO CAPS
500.0000 mg | ORAL_CAPSULE | Freq: Two times a day (BID) | ORAL | 0 refills | Status: AC
Start: 2019-09-15 — End: 2019-09-20

## 2019-09-15 MED ORDER — OLOPATADINE HCL 0.2 % OP SOLN: 1.0000 [drp] | Freq: Every day | OPHTHALMIC | 0 refills | Status: DC | PRN

## 2019-09-15 MED FILL — CEPHALEXIN 500 MG CAPSULE: 500 | 5 days supply | Qty: 10 | Fill #0

## 2019-09-15 MED FILL — TOBRAMYCIN 0.3 % SOLN: 0.3 | 30 days supply | Qty: 5 | Fill #0

## 2019-09-15 MED FILL — OLOPATADINE HCL 0.2 % SOLN: 0.2 | 25 days supply | Qty: 3 | Fill #0

## 2019-09-15 NOTE — Assessment & Plan Note (Signed)
Mildly elevated PHQ9 but coping well off meds.   Office Visit from 09/15/2019 in [facility] [facility]  PHQ-9 Total Score 9     Her major stressor is her weight. Referral to dietician discussed. She is already set up. Monitor closely off meds.

## 2019-09-15 NOTE — Progress Notes (Signed)
Patient ID: [name], female   DOB: [birth year], 59 y.o.   MRN: [id number]    SUBJECTIVE:   CHIEF COMPLAINT / HPI:   DM2:Compliant with her Metformin 500 XL qd. Managed by her endocrinologist. Here for A1C check.  Dysuria: C/O burning with urination x 2 days. She denies hematuria or a change in her urine color.  Depression: Stressed about her weight and had recent multiple surgeries. She is now back to work and she feels she is coping well.  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 Per her, neuro recommended no f/u. Doing well otherwise.  Right eye pain:C/O right Itchy, irritating and red eye. Hx of ulcers in her eyes in the past and the last episode was 2019. She saw Dr. Fraser Din from [facility] Ophthalmologist for this. She had blurry vision yesterday while looking at the computer screen, otherwise, no vision change. Last eye exam was March 2021. +Cataract.  PERTINENT  PMH / PSH: PMX reviewed  OBJECTIVE:   BP 134/78   Pulse 80   Ht 5\' 4"  (1.626 m)   Wt 204 lb (92.5 kg)   LMP  (LMP Unknown)   SpO2 98%   BMI 35.02 kg/m    Physical Exam Vitals and nursing note reviewed.  Constitutional:      Appearance: Normal appearance.  Eyes:     General: Lids are normal.     Extraocular Movements:     Right eye: Normal extraocular motion.     Left eye: Normal extraocular motion.     Pupils: Pupils are equal, round, and reactive to light.     Comments: Mildly injected conjunctiva B/L. No cornea ulceration or lesion. Good pupillary reaction to light and accomodation.   Cardiovascular:     Rate and Rhythm: Normal rate and regular rhythm.     Heart sounds: Normal heart sounds. No murmur heard.   Pulmonary:     Effort: Pulmonary effort is normal. No respiratory distress.     Breath sounds: Normal breath sounds. No wheezing.  Abdominal:     General: Bowel sounds are normal.     Palpations: Abdomen is soft.     Tenderness: There is no abdominal tenderness.  Musculoskeletal:     Right  lower leg: No edema.     Left lower leg: No edema.  Neurological:     Mental Status: She is alert.      ASSESSMENT/PLAN:   Type 2 diabetes mellitus with hyperglycemia, without long-term current use of insulin (HCC) A1C 6.1 May continue current dose of Metformin which may also help with weight loss. F/U with endocrinologist as planned. May consider Metformin holiday if her A1C remains good. Will defer to her endocrinologist.  Dysuria UA collected today positive for leuks and Nitrites. Urine sent to lab for culture. Keflex started pending culture report.  Major depressive disorder, recurrent episode, moderate (HCC) Mildly elevated PHQ9 but coping well off meds.   Office Visit from 09/15/2019 in [facility]  PHQ-9 Total Score 9     Her major stressor is her weight. Referral to dietician discussed. She is already set up. Monitor closely off meds.   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 Monitor closely for symptoms.  Morbid obesity ([place]) BMI 35 + DM and HTN will put her under morbid category. Counseling done. I discussed dietician referral. She stated she is already connected. 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 She discussed bariatric surgery. She may qualify if she fails at diet and  exercise only. We will readdress at her next visit.   Right eye pain VA with finger counting with her eye glasses on was normal B/L. Both eye mildly injected. ?? Allergic conjunctivitis. Topical antihistamine prescribed. I advised f/u with her ophthalmologist for reassessment. She attempted to call their office to schedule an appointment during the clinic today. Red flag signs and indication for ED visit discussed.  NB: She is due for PAP and I discussed her documented hx of VAIN 1 and need for close monitoring. She stated that she an OB/GYN appointment already set up. I confirmed this for her as well.  Andrena Mews, MD [place]

## 2019-09-15 NOTE — Assessment & Plan Note (Signed)
BMI 35 + DM and HTN will put her under morbid category. Counseling done. I discussed dietician referral. She stated she is already connected. 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 She discussed bariatric surgery. She may qualify if she fails at diet and exercise only. We will readdress at her next visit.

## 2019-09-15 NOTE — Patient Instructions (Signed)

## 2019-09-15 NOTE — Assessment & Plan Note (Signed)
UA collected today positive for leuks and Nitrites. Urine sent to lab for culture. Keflex started pending culture report.

## 2019-09-15 NOTE — Assessment & Plan Note (Signed)
No acute findings. Monitor closely for symptoms.

## 2019-09-15 NOTE — Assessment & Plan Note (Signed)
NB: She is due for PAP and I discussed her documented hx of VAIN 1 and need for close monitoring. She stated that she an OB/GYN appointment already set up. I confirmed this for her as well

## 2019-09-15 NOTE — Assessment & Plan Note (Signed)
A1C 6.1 May continue current dose of Metformin which may also help with weight loss. F/U with endocrinologist as planned. May consider Metformin holiday if her A1C remains good. Will defer to her endocrinologist.

## 2019-09-17 LAB — URINE CULTURE

## 2019-09-17 LAB — MICROALBUMIN / CREATININE URINE RATIO
Creatinine, Urine: 138.5 mg/dL
Microalb/Creat Ratio: 43 mg/g creat — ABNORMAL HIGH (ref 0–29)
Microalbumin, Urine: 59.4 ug/mL

## 2019-09-22 MED FILL — ESTRADIOL 0.075 MG PATCH: 0.075 | 28 days supply | Qty: 8 | Fill #4

## 2019-10-23 ENCOUNTER — Other Ambulatory Visit: Payer: Self-pay

## 2019-10-23 ENCOUNTER — Encounter: Payer: Self-pay | Admitting: Family Medicine

## 2019-10-23 ENCOUNTER — Ambulatory Visit (INDEPENDENT_AMBULATORY_CARE_PROVIDER_SITE_OTHER): Payer: No Typology Code available for payment source | Admitting: Family Medicine

## 2019-10-23 DIAGNOSIS — E1165 Type 2 diabetes mellitus with hyperglycemia: Secondary | ICD-10-CM | POA: Diagnosis not present

## 2019-10-23 DIAGNOSIS — E1169 Type 2 diabetes mellitus with other specified complication: Secondary | ICD-10-CM | POA: Diagnosis not present

## 2019-10-23 DIAGNOSIS — E785 Hyperlipidemia, unspecified: Secondary | ICD-10-CM

## 2019-10-23 MED ORDER — WEGOVY 0.25 MG/0.5ML ~~LOC~~ SOAJ: 0.2500 mg | SUBCUTANEOUS | 0 refills | Status: DC

## 2019-10-23 NOTE — Assessment & Plan Note (Signed)
Stable on Metformin. As discussed with her, with weight loss and use of Wegogy we might be able to get her off metformin, since her A1C has really been around 6-6.4. Monitor CBG closely at home.

## 2019-10-23 NOTE — Assessment & Plan Note (Signed)
Multifactorial: Poor diet, low exercise, medication (estradiol) I spent time counseling on diet and exercise. Cutting back on carbs and concentrated sugar drink. Add more fruits and vege to meal. I discussed Wegovy as a newly FDA approved weight loss meds, discussed side effects including thyroid cancer/tumor. I strongly recommended lifestyle modification prior to starting any medication, but she insisted on getting started on Wegovy today. She agreed to monitor for neck pain or swelling. I started her on a low dose to reassess in 4 weeks. At that point, we will titrate to 0.5 mg [place] injection weekly.

## 2019-10-23 NOTE — Patient Instructions (Addendum)
It was nice seeing you. Today we discussed weight loss plan including diet change and exercise. After discussing side effect of Wegovy, you agreed to trial it. Start 0.25 mg weekly for four week. We will titrate dose up following that. See me back in 4 weeks.  Calorie Counting for Weight Loss Calories are units of energy. Your body needs a certain amount of calories from food to keep you going throughout the day. When you eat more calories than your body needs, your body stores the extra calories as fat. When you eat fewer calories than your body needs, your body burns fat to get the energy it needs. Calorie counting means keeping track of how many calories you eat and drink each day. Calorie counting can be helpful if you need to lose weight. If you make sure to eat fewer calories than your body needs, you should lose weight. Ask your health care provider what a healthy weight is for you. For calorie counting to work, you will need to eat the right number of calories in a day in order to lose a healthy amount of weight per week. A dietitian can help you determine how many calories you need in a day and will give you suggestions on how to reach your calorie goal.  A healthy amount of weight to lose per week is usually 1-2 lb (0.5-0.9 kg). This usually means that your daily calorie intake should be reduced by 500-750 calories.  Eating 1,200 - 1,500 calories per day can help most women lose weight.  Eating 1,500 - 1,800 calories per day can help most men lose weight. What is my plan? My goal is to have __________ calories per day. If I have this many calories per day, I should lose around __________ pounds per week. What do I need to know about calorie counting? In order to meet your daily calorie goal, you will need to:  Find out how many calories are in each food you would like to eat. Try to do this before you eat.  Decide how much of the food you plan to eat.  Write down what you ate and how  many calories it had. Doing this is called keeping a food log. To successfully lose weight, it is important to balance calorie counting with a healthy lifestyle that includes regular activity. Aim for 150 minutes of moderate exercise (such as walking) or 75 minutes of vigorous exercise (such as running) each week. Where do I find calorie information?  The number of calories in a food can be found on a Nutrition Facts label. If a food does not have a Nutrition Facts label, try to look up the calories online or ask your dietitian for help. Remember that calories are listed per serving. If you choose to have more than one serving of a food, you will have to multiply the calories per serving by the amount of servings you plan to eat. For example, the label on a package of bread might say that a serving size is 1 slice and that there are 90 calories in a serving. If you eat 1 slice, you will have eaten 90 calories. If you eat 2 slices, you will have eaten 180 calories. How do I keep a food log? Immediately after each meal, record the following information in your food log:  What you ate. Don't forget to include toppings, sauces, and other extras on the food.  How much you ate. This can be measured in cups, ounces,  or number of items.  How many calories each food and drink had.  The total number of calories in the meal. Keep your food log near you, such as in a small notebook in your pocket, or use a mobile app or website. Some programs will calculate calories for you and show you how many calories you have left for the day to meet your goal. What are some calorie counting tips?   Use your calories on foods and drinks that will fill you up and not leave you hungry: ? Some examples of foods that fill you up are nuts and nut butters, vegetables, lean proteins, and high-fiber foods like whole grains. High-fiber foods are foods with more than 5 g fiber per serving. ? Drinks such as sodas, specialty  coffee drinks, alcohol, and juices have a lot of calories, yet do not fill you up.  Eat nutritious foods and avoid empty calories. Empty calories are calories you get from foods or beverages that do not have many vitamins or protein, such as candy, sweets, and soda. It is better to have a nutritious high-calorie food (such as an avocado) than a food with few nutrients (such as a bag of chips).  Know how many calories are in the foods you eat most often. This will help you calculate calorie counts faster.  Pay attention to calories in drinks. Low-calorie drinks include water and unsweetened drinks.  Pay attention to nutrition labels for "low fat" or "fat free" foods. These foods sometimes have the same amount of calories or more calories than the full fat versions. They also often have added sugar, starch, or salt, to make up for flavor that was removed with the fat.  Find a way of tracking calories that works for you. Get creative. Try different apps or programs if writing down calories does not work for you. What are some portion control tips?  Know how many calories are in a serving. This will help you know how many servings of a certain food you can have.  Use a measuring cup to measure serving sizes. You could also try weighing out portions on a kitchen scale. With time, you will be able to estimate serving sizes for some foods.  Take some time to put servings of different foods on your favorite plates, bowls, and cups so you know what a serving looks like.  Try not to eat straight from a bag or box. Doing this can lead to overeating. Put the amount you would like to eat in a cup or on a plate to make sure you are eating the right portion.  Use smaller plates, glasses, and bowls to prevent overeating.  Try not to multitask (for example, watch TV or use your computer) while eating. If it is time to eat, sit down at a table and enjoy your food. This will help you to know when you are full.  It will also help you to be aware of what you are eating and how much you are eating. What are tips for following this plan? Reading food labels  Check the calorie count compared to the serving size. The serving size may be smaller than what you are used to eating.  Check the source of the calories. Make sure the food you are eating is high in vitamins and protein and low in saturated and trans fats. Shopping  Read nutrition labels while you shop. This will help you make healthy decisions before you decide to purchase your food.  Make a grocery list and stick to it. Cooking  Try to cook your favorite foods in a healthier way. For example, try baking instead of frying.  Use low-fat dairy products. Meal planning  Use more fruits and vegetables. Half of your plate should be fruits and vegetables.  Include lean proteins like poultry and fish. How do I count calories when eating out?  Ask for smaller portion sizes.  Consider sharing an entree and sides instead of getting your own entree.  If you get your own entree, eat only half. Ask for a box at the beginning of your meal and put the rest of your entree in it so you are not tempted to eat it.  If calories are listed on the menu, choose the lower calorie options.  Choose dishes that include vegetables, fruits, whole grains, low-fat dairy products, and lean protein.  Choose items that are boiled, broiled, grilled, or steamed. Stay away from items that are buttered, battered, fried, or served with cream sauce. Items labeled "crispy" are usually fried, unless stated otherwise.  Choose water, low-fat milk, unsweetened iced tea, or other drinks without added sugar. If you want an alcoholic beverage, choose a lower calorie option such as a glass of wine or light beer.  Ask for dressings, sauces, and syrups on the side. These are usually high in calories, so you should limit the amount you eat.  If you want a salad, choose a garden salad  and ask for grilled meats. Avoid extra toppings like bacon, cheese, or fried items. Ask for the dressing on the side, or ask for olive oil and vinegar or lemon to use as dressing.  Estimate how many servings of a food you are given. For example, a serving of cooked rice is  cup or about the size of half a baseball. Knowing serving sizes will help you be aware of how much food you are eating at restaurants. The list below tells you how big or small some common portion sizes are based on everyday objects: ? 1 oz--4 stacked dice. ? 3 oz--1 deck of cards. ? 1 tsp--1 die. ? 1 Tbsp-- a ping-pong ball. ? 2 Tbsp--1 ping-pong ball. ?  cup-- baseball. ? 1 cup--1 baseball. Summary  Calorie counting means keeping track of how many calories you eat and drink each day. If you eat fewer calories than your body needs, you should lose weight.  A healthy amount of weight to lose per week is usually 1-2 lb (0.5-0.9 kg). This usually means reducing your daily calorie intake by 500-750 calories.  The number of calories in a food can be found on a Nutrition Facts label. If a food does not have a Nutrition Facts label, try to look up the calories online or ask your dietitian for help.  Use your calories on foods and drinks that will fill you up, and not on foods and drinks that will leave you hungry.  Use smaller plates, glasses, and bowls to prevent overeating. This information is not intended to replace advice given to you by your health care provider. Make sure you discuss any questions you have with your health care provider. Document Revised: 11/22/2017 Document Reviewed: 02/03/2016 Elsevier Patient Education  [employer].

## 2019-10-23 NOTE — Assessment & Plan Note (Signed)
She is above her LDL goal of <70. LDL checked today. As discussed with her, we might go up on her Crestor if her LDL remains elevated. She agreed with the plan.

## 2019-10-23 NOTE — Addendum Note (Signed)
Addended by: Andrena Mews T on: 10/23/2019 01:12 PM   Modules accepted: Orders

## 2019-10-23 NOTE — Progress Notes (Deleted)
    SUBJECTIVE:   CHIEF COMPLAINT / HPI:   ***  PERTINENT  PMH / PSH: ***  OBJECTIVE:   LMP  (LMP Unknown)   ***  ASSESSMENT/PLAN:   No problem-specific Assessment & Plan notes found for this encounter.     Andrena Mews, MD [place]

## 2019-10-23 NOTE — Progress Notes (Addendum)
Patient ID: [name], female   DOB: February 27, 1961, 59 y.o.   MRN: [id number]    SUBJECTIVE:   CHIEF COMPLAINT / HPI:   Obesity:Here to follow-up for her weight gain. She has not been doing well with her diet. She snacks all day, sometimes on candy bars, eats heavy carbs meal at night. She has improved a bit on vege diet. She drinks lots of Soda and sweat tea. She will schedule dietician appointment. DM2:Compliant with Metformin XL 500 mg qd. Doing well in general. Hyperlipidemia:She is compliant with her Zetia and Crestor 10 mg qd. No concerns.  PERTINENT  PMH / PSH: PMX reviewed.  OBJECTIVE:   BP 132/80   Pulse 84   Ht 5\' 5"  (1.651 m)   Wt 210 lb 6.4 oz (95.4 kg)   LMP  (LMP Unknown)   SpO2 97%   BMI 35.01 kg/m   Physical Exam Vitals and nursing note reviewed.  Constitutional:      Appearance: She is obese.  Cardiovascular:     Rate and Rhythm: Normal rate and regular rhythm.     Heart sounds: Normal heart sounds. No murmur heard.   Pulmonary:     Effort: Pulmonary effort is normal. No respiratory distress.     Breath sounds: Normal breath sounds. No wheezing.  Abdominal:     General: Abdomen is flat. Bowel sounds are normal. There is no distension.     Palpations: Abdomen is soft. There is no mass.     Tenderness: There is no abdominal tenderness.  Musculoskeletal:     Right lower leg: No edema.     Left lower leg: No edema.  Neurological:     Mental Status: She is alert.  Psychiatric:        Mood and Affect: Mood normal.        Behavior: Behavior normal.        Thought Content: Thought content normal.        Judgment: Judgment normal.   FOOT EXAM:Sensory exam of the foot is normal, tested with the monofilament. Good pulses, no lesions or ulcers, good peripheral pulses. + callus on her right big toe.     ASSESSMENT/PLAN:   Morbid obesity ([place]) Multifactorial: Poor diet, low exercise, medication (estradiol) I spent time counseling on diet and  exercise. Cutting back on carbs and concentrated sugar drink. Add more fruits and vege to meal. I discussed Wegovy as a newly FDA approved weight loss meds, discussed side effects including thyroid cancer/tumor. I strongly recommended lifestyle modification prior to starting any medication, but she insisted on getting started on Wegovy today. She agreed to monitor for neck pain or swelling. I started her on a low dose to reassess in 4 weeks. At that point, we will titrate to 0.5 mg [place] injection weekly.  Type 2 diabetes mellitus with hyperglycemia, without long-term current use of insulin (HCC) Stable on Metformin. As discussed with her, with weight loss and use of Wegogy we might be able to get her off metformin, since her A1C has really been around 6-6.4. Monitor CBG closely at home.   Hyperlipidemia She is above her LDL goal of <70. LDL checked today. As discussed with her, we might go up on her Crestor if her LDL remains elevated. She agreed with the plan.   Note that I discussed her urine Microalbumin report was discussed with her again. Could be in the setting of DM, although she is well controlled. Her kidney functions looks good from the  recent check. BP looks good. ACEi/ARBs discussed but not interested now especially since her BP looks good. Monitor closely.  Andrena Mews, MD [place]

## 2019-10-24 ENCOUNTER — Telehealth: Payer: Self-pay | Admitting: Family Medicine

## 2019-10-24 ENCOUNTER — Other Ambulatory Visit: Payer: Self-pay | Admitting: Family Medicine

## 2019-10-24 LAB — LDL CHOLESTEROL, DIRECT: LDL Direct: 148 mg/dL — ABNORMAL HIGH (ref 0–99)

## 2019-10-24 MED ORDER — ROSUVASTATIN CALCIUM 20 MG PO TABS: 20.0000 mg | ORAL_TABLET | Freq: Every day | ORAL | 1 refills | Status: DC

## 2019-10-24 NOTE — Telephone Encounter (Signed)
LDL improved but still elevated. I andvised increasing Crestor to 20 mg qd. She will double up on her dose.  She may be able to get off Zetia with this new dose and weight loss.  Recheck LDL in 6 months. She agreed with the plan.

## 2019-10-26 MED FILL — ROSUVASTATIN CALCIUM 20 MG: 20 | 90 days supply | Qty: 90 | Fill #0

## 2019-10-26 MED FILL — WEGOVY 0.25 MG/0.5ML SOAJ: 0.25 | 28 days supply | Qty: 2 | Fill #0

## 2019-10-27 NOTE — Telephone Encounter (Signed)
Appt made for 10/29/19 @ 2:15 with Dr. Valentina Lucks.  Appt with Dr. Gwendlyn Deutscher canceled. Christen Bame, CMA

## 2019-10-27 NOTE — Telephone Encounter (Signed)
Patient discussed with Dr. Valentina Lucks and he will be happy to work her through the use of this injectable medication.  Please help her schedule pharmacy appointment with Dr. Valentina Lucks and advise her to come in with her Wegovy on the day of her appointment for training. Thanks.

## 2019-10-27 NOTE — Telephone Encounter (Signed)
Patient is anxious about how to use the [facility].  She would like Dr. Gwendlyn Deutscher to "walk her thru it".  Advised that it may be better to have her come in and show her.  She is agreeable to an appt on Friday, but would like for me to check with Dr. Gwendlyn Deutscher as well.  Christen Bame, CMA

## 2019-10-29 ENCOUNTER — Ambulatory Visit (INDEPENDENT_AMBULATORY_CARE_PROVIDER_SITE_OTHER): Payer: No Typology Code available for payment source | Admitting: Pharmacist

## 2019-10-29 ENCOUNTER — Encounter: Payer: Self-pay | Admitting: Pharmacist

## 2019-10-29 ENCOUNTER — Other Ambulatory Visit: Payer: Self-pay

## 2019-10-29 NOTE — Patient Instructions (Signed)
Nice meeting you today!  Start taking Wegovy 0.25 mg once weekly for 4 weeks!  We will give you call in 4 weeks to see how the medicine is working for you!

## 2019-10-29 NOTE — Assessment & Plan Note (Addendum)
Discussed clinical benefits, potential side effects, and proper administration technique of Wegovy. Patient verbalized understanding with teach back method. Explained patient will take Wegovy 0.25 mg once weekly for 4-weeks, and if she tolerates the medication at week 4, she will increase to 0.5 mg once weekly for an additional 4 weeks. Patient brought in 4 pens of single-dose prefilled Wegovy 0.25 mg. One of the patient supplied Wegovy pens was wasted to due pharmacist administration error. Provided patient with sample of 1 pen of Ozempic 0.25 mg to replace the wasted pen. First dose of Wegovy was given in clinic today.

## 2019-10-29 NOTE — Progress Notes (Signed)
   S:     Chief Complaint  Patient presents with  . Medication Management    Wegovy injection teaching   Patient arrives today in good spirits for [facility] injection training. Patient was referred and last seen by Primary Care Provider on 10/23/2019.   Insurance coverage/medication affordability: [employer]  Medication reports adherence with all meds .   Current diabetes medications include: Metformin XR 500 mg daily Current hyperlipidemia medications include: Rosuvastatin 20 mg daily, Ezetimibe 10 mg daily     O:  Physical Exam Vitals reviewed.  Constitutional:      Appearance: Normal appearance.  Neurological:     Mental Status: She is alert.  Psychiatric:        Mood and Affect: Mood normal.        Behavior: Behavior normal.        Thought Content: Thought content normal.    Review of Systems  All other systems reviewed and are negative.  Lab Results  Component Value Date   HGBA1C 6.1 09/15/2019   Vitals:   10/29/19 1422  BP: 128/75  Pulse: 87  SpO2: 96%    Lipid Panel     Component Value Date/Time   CHOL 260 (H) 07/03/2019 1041   CHOL 197 02/05/2014 0000   TRIG 91.0 07/03/2019 1041   HDL 51.60 07/03/2019 1041   HDL 45 02/05/2014 0000   CHOLHDL 5 07/03/2019 1041   VLDL 18.2 07/03/2019 1041   LDLCALC 190 (H) 07/03/2019 1041   LDLCALC 117 (H) 02/05/2014 0000   LDLDIRECT 148 (H) 10/23/2019 0955    Clinical Atherosclerotic Cardiovascular Disease (ASCVD): No  The 10-year ASCVD risk score Mikey Bussing DC Jr., et al., 2013) is: 12.1%   Values used to calculate the score:     Age: 51 years     Sex: Female     Is Non-Hispanic African American: Yes     Diabetic: Yes     Tobacco smoker: No     Systolic Blood Pressure: 027 mmHg     Is BP treated: No     HDL Cholesterol: 51.6 mg/dL     Total Cholesterol: 260 mg/dL    A/P: Weight loss managemnt Discussed clinical benefits, potential side effects, and proper administration technique of Wegovy. Patient  verbalized understanding with teach back method. Explained patient will take Wegovy 0.25 mg once weekly for 4-weeks, and if she tolerates the medication at week 4, she will increase to 0.5 mg once weekly for an additional 4 weeks. Patient brought in 4 pens of single-dose prefilled Wegovy 0.25 mg. One of the patient supplied Wegovy pens was wasted to due pharmacist administration error. Provided patient with sample of 1 pen of Ozempic 0.25 mg to replace the wasted pen. First dose of Wegovy was given in clinic today.   Medication Samples have been provided to the patient.  Drug name: Ozempic       Strength: 0.25 mg       Qty: 1   LOT: [id number] Exp.Date: 06/16/2021  Dosing instructions: Inject 0.25 mg once weekly  Written patient instructions provided.  Total time in face to face counseling 25 minutes.   Will follow up with patient via call in 4 weeks to assess side-effect.    Patient seen with Mickeal Skinner, PharmD Candidate and Lorel Monaco, PharmD, PGY2 Pharmacy Resident.

## 2019-10-30 ENCOUNTER — Ambulatory Visit: Payer: No Typology Code available for payment source | Admitting: Family Medicine

## 2019-10-30 NOTE — Progress Notes (Signed)
Reviewed: I agree with Dr. Koval's documentation and management. 

## 2019-11-10 ENCOUNTER — Other Ambulatory Visit: Payer: Self-pay | Admitting: Internal Medicine

## 2019-11-10 MED FILL — ESTRADIOL 0.075 MG PATCH: 0.075 | 28 days supply | Qty: 8 | Fill #5

## 2019-11-11 ENCOUNTER — Other Ambulatory Visit: Payer: Self-pay | Admitting: Internal Medicine

## 2019-11-11 MED FILL — EZETIMIBE 10 MG TABS: 10 | 90 days supply | Qty: 90 | Fill #0

## 2019-11-19 ENCOUNTER — Telehealth: Payer: Self-pay | Admitting: Pharmacist

## 2019-11-19 MED ORDER — WEGOVY 0.5 MG/0.5ML ~~LOC~~ SOAJ: 0.5000 mg | SUBCUTANEOUS | 0 refills | Status: DC

## 2019-11-19 NOTE — Telephone Encounter (Signed)
Noted and agree. 

## 2019-11-19 NOTE — Telephone Encounter (Signed)
Patient would like to receive another call from [facility] or Dr. Valentina Lucks regarding her meds.

## 2019-11-19 NOTE — Telephone Encounter (Signed)
Contacted patient regarding Wegovy. Patient's pharmacy informed her that Wegovy 0.5 mg is on back-order and unsure when they will receive the next shipment.   Will provide patient with four sample pens of Semaglutide 0.5 mg given weekly x4. Patient will come by the office tomorrow morning around 9:30AM.  Drug name: Semaglutide 0.5 mg Qty: 4 pens   LOT: [id number]  Exp.Date: 06/16/2021  Dosing instructions: Inject semaglutide 0.5 mg subcutaneously once weekly x4  Lorel Monaco, PharmD PGY2 Ambulatory Care Resident [place]

## 2019-11-19 NOTE — Assessment & Plan Note (Signed)
Completed Wegovy 0.25 mg weekly x4. Patient reports taking Wegovy on Thursdays and has not experienced any side effects.   Plan: Start Wegovy 0.5 mg weekly x4 next Thursday, September 9th

## 2019-11-19 NOTE — Telephone Encounter (Signed)
Contacted patient to assess tolerability after completing Wegovy 0.25 mg weekly x4. Patient reports taking Wegovy on Thursdays and has not experienced any side effects.   Plan: Start Wegovy 0.5 mg weekly x4 next Thursday, September 9th.  Telephone call conducted [name], PGY2Pharmacy Resident.

## 2019-11-27 MED FILL — WEGOVY 0.5 MG/0.5ML SOAJ: 0.5 | 28 days supply | Qty: 2 | Fill #0

## 2019-12-09 ENCOUNTER — Other Ambulatory Visit: Payer: Self-pay | Admitting: Obstetrics and Gynecology

## 2019-12-09 DIAGNOSIS — Z1231 Encounter for screening mammogram for malignant neoplasm of breast: Secondary | ICD-10-CM

## 2019-12-18 ENCOUNTER — Ambulatory Visit: Payer: No Typology Code available for payment source

## 2019-12-18 ENCOUNTER — Other Ambulatory Visit: Payer: Self-pay

## 2019-12-18 ENCOUNTER — Ambulatory Visit (INDEPENDENT_AMBULATORY_CARE_PROVIDER_SITE_OTHER): Payer: No Typology Code available for payment source | Admitting: Family Medicine

## 2019-12-18 ENCOUNTER — Encounter: Payer: Self-pay | Admitting: Family Medicine

## 2019-12-18 VITALS — BP 120/70 | HR 85 | Ht 65.0 in | Wt 203.2 lb

## 2019-12-18 DIAGNOSIS — E8881 Metabolic syndrome: Secondary | ICD-10-CM | POA: Diagnosis not present

## 2019-12-18 DIAGNOSIS — E1165 Type 2 diabetes mellitus with hyperglycemia: Secondary | ICD-10-CM | POA: Diagnosis not present

## 2019-12-18 DIAGNOSIS — E8889 Other specified metabolic disorders: Secondary | ICD-10-CM | POA: Insufficient documentation

## 2019-12-18 DIAGNOSIS — R5383 Other fatigue: Secondary | ICD-10-CM | POA: Diagnosis not present

## 2019-12-18 DIAGNOSIS — M79675 Pain in left toe(s): Secondary | ICD-10-CM

## 2019-12-18 DIAGNOSIS — Z23 Encounter for immunization: Secondary | ICD-10-CM

## 2019-12-18 LAB — POCT GLYCOSYLATED HEMOGLOBIN (HGB A1C): HbA1c, POC (prediabetic range): 5.9 % (ref 5.7–6.4)

## 2019-12-18 NOTE — Assessment & Plan Note (Signed)
Compliant with her Semaglutide. She lost 6lbs since her last visit in August. Diet and exercise counseling discussed. She will restart Wegovy once she completes her Ozempic.

## 2019-12-18 NOTE — Patient Instructions (Signed)

## 2019-12-18 NOTE — Assessment & Plan Note (Signed)
A1C looks good. She has an upcoming appointment with her endocrinologist. She will discuss going off Metformin altogether with them since her number remains good and she is also on a Semaglutide. F/U in 3-4 months.

## 2019-12-18 NOTE — Progress Notes (Signed)
    SUBJECTIVE:   CHIEF COMPLAINT / HPI:   Toe pain: Patient c/o left big toe pain x 1 month. Pain occurs at least three times a week. Denies associated swelling or redness. Denies hx of trauma. She denies any pain today.  DM2/Weight management: She is doing well on her Ozempic. However, she recently got her [facility] refilled. She plan on switching back to [facility] for weight management. She takes her Metformin on and off.  Fatigue: In the last few months.   PERTINENT  PMH / PSH: PMX reviewed  OBJECTIVE:   BP 120/70   Pulse 85   Ht 5\' 5"  (1.651 m)   Wt 203 lb 4 oz (92.2 kg)   LMP  (LMP Unknown)   SpO2 99%   BMI 33.82 kg/m    Vitals:   12/18/19 0845  BP: 120/70  Pulse: 85  SpO2: 99%  Weight: 203 lb 4 oz (92.2 kg)  Height: 5\' 5"  (1.651 m)    Physical Exam Vitals and nursing note reviewed.  Constitutional:      Appearance: Normal appearance.  Cardiovascular:     Rate and Rhythm: Normal rate and regular rhythm.     Heart sounds: Normal heart sounds. No murmur heard.   Pulmonary:     Effort: Pulmonary effort is normal. No respiratory distress.     Breath sounds: Normal breath sounds. No wheezing or rhonchi.  Abdominal:     General: Bowel sounds are normal. There is no distension.     Tenderness: There is no abdominal tenderness.  Feet:     Comments: Normal ROM of all toes with no tenderness, swelling or erythema.     ASSESSMENT/PLAN:  Toe pain: ?? Gout vs arthritis. Use Tylenol/Ibuprofen as needed for pain. Check uric acid level today. Xray if there is no improvement. She agreed with the plan.  DM2 (diabetes mellitus, type 2) (HCC) A1C looks good. She has an upcoming appointment with her endocrinologist. She will discuss going off Metformin altogether with them since her number remains good and she is also on a Semaglutide. F/U in 3-4 months.  Morbid obesity ([place]) Compliant with her Semaglutide. She lost 6lbs since her last visit in August. Diet and  exercise counseling discussed. She will restart Wegovy once she completes her Ozempic.    Fatigue: ?? Related to stress at work. We checked vitamin B12, TSH, Vitamin D today. Her recent hemoglobin was normal. I will contact her soon with the result.   Andrena Mews, MD [place]

## 2019-12-19 LAB — CBC
Hematocrit: 38.2 % (ref 34.0–46.6)
Hemoglobin: 12.6 g/dL (ref 11.1–15.9)
MCH: 28.5 pg (ref 26.6–33.0)
MCHC: 33 g/dL (ref 31.5–35.7)
MCV: 86 fL (ref 79–97)
Platelets: 293 10*3/uL (ref 150–450)
RBC: 4.42 x10E6/uL (ref 3.77–5.28)
RDW: 13.3 % (ref 11.7–15.4)
WBC: 5.7 10*3/uL (ref 3.4–10.8)

## 2019-12-19 LAB — VITAMIN B12: Vitamin B-12: 397 pg/mL (ref 232–1245)

## 2019-12-19 LAB — VITAMIN D 25 HYDROXY (VIT D DEFICIENCY, FRACTURES): Vit D, 25-Hydroxy: 26.2 ng/mL — ABNORMAL LOW (ref 30.0–100.0)

## 2019-12-19 LAB — URIC ACID: Uric Acid: 3.5 mg/dL (ref 3.0–7.2)

## 2019-12-19 LAB — TSH: TSH: 0.565 u[IU]/mL (ref 0.450–4.500)

## 2019-12-21 ENCOUNTER — Telehealth: Payer: Self-pay | Admitting: Family Medicine

## 2019-12-21 NOTE — Telephone Encounter (Signed)
  Result discussed. I recommended OTC vitamin D supplement.  Uric acid level is normal. May obtain xray in the future if pain persists or worsens. Otherwise, Ibuprofen as needed recommended.  She requested that her flu shot report be faxed to her job as she is unable to print from [employer] account.   Fax: [phone]

## 2019-12-21 NOTE — Telephone Encounter (Signed)
Immunization records faxed to number given. Salvatore Marvel, CMA

## 2019-12-29 ENCOUNTER — Ambulatory Visit (INDEPENDENT_AMBULATORY_CARE_PROVIDER_SITE_OTHER): Payer: No Typology Code available for payment source | Admitting: Family Medicine

## 2019-12-29 ENCOUNTER — Other Ambulatory Visit: Payer: Self-pay | Admitting: Family Medicine

## 2019-12-29 ENCOUNTER — Other Ambulatory Visit: Payer: Self-pay

## 2019-12-29 VITALS — BP 124/70 | HR 76 | Ht 65.0 in | Wt 201.0 lb

## 2019-12-29 DIAGNOSIS — N390 Urinary tract infection, site not specified: Secondary | ICD-10-CM | POA: Insufficient documentation

## 2019-12-29 DIAGNOSIS — R300 Dysuria: Secondary | ICD-10-CM

## 2019-12-29 DIAGNOSIS — N3001 Acute cystitis with hematuria: Secondary | ICD-10-CM

## 2019-12-29 LAB — POCT URINALYSIS DIP (MANUAL ENTRY)
Bilirubin, UA: NEGATIVE
Glucose, UA: NEGATIVE mg/dL
Ketones, POC UA: NEGATIVE mg/dL
Nitrite, UA: NEGATIVE
Protein Ur, POC: NEGATIVE mg/dL
Spec Grav, UA: 1.01 (ref 1.010–1.025)
Urobilinogen, UA: 0.2 E.U./dL
pH, UA: 6.5 (ref 5.0–8.0)

## 2019-12-29 LAB — POCT UA - MICROSCOPIC ONLY: WBC, Ur, HPF, POC: >20

## 2019-12-29 MED ORDER — CEPHALEXIN 500 MG PO CAPS: 500.0000 mg | ORAL_CAPSULE | Freq: Four times a day (QID) | ORAL | 0 refills | Status: DC

## 2019-12-29 MED FILL — CEPHALEXIN 500 MG CAPSULE: 500 | 5 days supply | Qty: 20 | Fill #0

## 2019-12-29 NOTE — Assessment & Plan Note (Signed)
History and physical in addition to urinalysis all consistent with urinary tract infection. -Keflex 500 4 times daily for 5 days -Follow-up urine culture -Repeat UA at next visit to ensure resolution of bleeding

## 2019-12-29 NOTE — Patient Instructions (Signed)
UTI: I have sent in an antibiotic called Keflex. Please take this antibiotic every 6 hours for the next 5 days. Expect this to totally improve your symptoms.  Left toe pain: I agree that the pain in your foot is a little bit unusual. I do not think that there is anything scary going on based on what you told me. I agree with you, it is reasonable to hold off on imaging for now. Please let me or Dr. Gwendlyn Deutscher know if this pain is worsening and we can move forward with x-rays.

## 2019-12-29 NOTE — Progress Notes (Signed)
    SUBJECTIVE:   CHIEF COMPLAINT / HPI:   UTI [name] reports that she has been having about 3 days of lower abdominal pain, pain with urination and the sensation of incomplete voiding. She specifically denies fever, nausea, vomiting, shortness of breath. She has had UTIs in the past but not in the past couple years.  Left toe pain She was recently seen and assessed by Dr. Gwendlyn Deutscher for left toe pain. She notes a constant, mild dull ache in her left toe. At her last visit, she was instructed to use Tylenol and Motrin for discomfort and to continue to monitor. The option of x-rays was discussed at the last visit but they decided to hold off unless symptoms worsen. Today, she notes that her symptoms seem to be exactly the same, they do not seem to be worsening or improving.  PERTINENT  PMH / PSH: Diabetes  OBJECTIVE:   BP 124/70   Pulse 76   Ht 5\' 5"  (1.651 m)   Wt 201 lb (91.2 kg)   LMP  (LMP Unknown)   SpO2 100%   BMI 33.45 kg/m    General: Alert and cooperative and appears to be in no acute distress Cardio: Normal S1 and S2, no S3 or S4. Rhythm is regular. No murmurs or rubs.   Pulm: Clear to auscultation bilaterally, no crackles, wheezing, or diminished breath sounds. Normal respiratory effort Abdomen: Bowel sounds normal. Abdomen soft and non-tender.  Extremities: No peripheral edema. Warm/ well perfused.  Strong radial pulse. No significant erythema or swelling of left first toe. Good extension and flexion of the left first toe.   ASSESSMENT/PLAN:   UTI (urinary tract infection) History and physical in addition to urinalysis all consistent with urinary tract infection. -Keflex 500 4 times daily for 5 days -Follow-up urine culture -Repeat UA at next visit to ensure resolution of bleeding   Left toe pain Unclear etiology. Possibly soft tissue injury. Potentially stress fracture or injury to the sesamoid bones although this does seem unlikely. Although the location is  typical of gout, the appearance and pain are not consistent with gout and her uric acid level was normal. Her discomfort seems stable from her last visit she was not interested in moving forward with x-rays at this time. She reported that she would reach out if the pain did worsen to consider x-rays.  Matilde Haymaker, MD [place]

## 2019-12-30 NOTE — Progress Notes (Signed)
59 y.o. [id number] Married [place] American female here for annual exam. Patient would like to discuss stopping the Estradiol patch due to possible allergic reaction.    Having a skin reaction to the patch.   Has a UTI and taking Keflex.   A1C is 5.9.  Changed her diet.  Walking.  Did her Covid vaccine with booster and flu vaccine.   Mother passed away last year around Thanksgiving.   PCP: Andrena Mews, MD    No LMP recorded (lmp unknown). Patient has had a hysterectomy.           Sexually active: Yes.    The current method of family planning is status post hysterectomy.    Exercising: Yes.    walking Smoker:  no  Health Maintenance: Pap:  11/12/16 Vag pap Neg, 10-14-13 vag.pap Neg:Neg HR HPV, 10-05-11 LGSIL History of abnormal Pap:  Yes, 10-05-11 LGSIL MMG: 01-22-19 3D/Neg/density C/BiRads1--appt. 01-29-20 Colonoscopy: 2014 [facility], follow up in 10 years BMD:   none  Result  n/a TDaP:  2011.  Declines.  Gardasil:   no HIV: 11-18-18 NR Hep C:12-11-16 Neg Screening Labs:  PCP   reports that she quit smoking about 9 years ago. Her smoking use included cigarettes. She has a 15.00 pack-year smoking history. She has never used smokeless tobacco. She reports that she does not drink alcohol and does not use drugs.  Past Medical History:  Diagnosis Date  . Anxiety   . 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 Brain tumor (benign) ([place]) 2018  . Chest pain 05/15/2015  . Complication of anesthesia   . Depression    in the past  . Diabetes mellitus without complication ([place])    Type II  . GERD (gastroesophageal reflux disease)    04/30/2019- "in the past"  . H/O varicella   . History of measles, mumps, or rubella   . HSV-2 (herpes simplex virus 2) infection   . Hx of ovarian cyst   . Hyperlipidemia   . Hypersomnia 07/19/2015  . Hypertension    "sometimes"   . Migraine headache    "due to cervical issuses"  . Plantar fasciitis   . PONV (postoperative nausea and vomiting)   .  Scapular dyskinesis 10/03/2016  . Sleep apnea    pt denies  . Sleep deprivation    loss of sleep  . VAIN I (vaginal intraepithelial neoplasia grade I) 2013   pap and confirmed by colposcopic biopsy    Past Surgical History:  Procedure Laterality Date  . ABDOMINAL HYSTERECTOMY    . ANTERIOR CERVICAL DECOMP/DISCECTOMY FUSION N/A 05/07/2019   Procedure: ANTERIOR CERVICAL DECOMPRESSION FUSION CERVICAL 5-6, CERVICAL 6-7 WITH INSTRUMENTATION AND ALLOGRAFT;  Surgeon: Phylliss Bob, MD;  Location: [facility];  Service: Orthopedics;  Laterality: N/A;  . CHOLECYSTECTOMY  11/13/10  . COLONOSCOPY  Multiple  . DILATION AND CURETTAGE OF UTERUS    . ESOPHAGOGASTRODUODENOSCOPY    . OOPHORECTOMY  2009   bilateral salpingo-oophorectomy.  Marland Kitchen SHOULDER ARTHROSCOPY WITH ROTATOR CUFF REPAIR Right 08/03/2019   Procedure: SHOULDER ARTHROSCOPY WITH. ROTATOR CUFF DEBRIDEMENT, SUBACROMIAL DECOMPRESSION, DISTAL CLAVICLE EXCISION;  Surgeon: Tania Ade, MD;  Location: [facility];  Service: Orthopedics;  Laterality: Right;  . TUBAL LIGATION      Current Outpatient Medications  Medication Sig Dispense Refill  . blood glucose meter kit and supplies Dispense based on patient and insurance preference. Use once a day as directed. (FOR ICD-10 E10.9, E11.9). 1 each 0  .  cephALEXin (KEFLEX) 500 MG capsule Take 1 capsule (500 mg total) by mouth 4 (four) times daily for 5 days. 20 capsule 0  . Elastic Bandages & Supports (WRIST BRACE/LEFT MEDIUM) MISC 1 each by Does not apply route as needed. 1 each 0  . estradiol (VIVELLE-DOT) 0.075 MG/24HR Place 1 patch onto the skin 2 (two) times a week. 8 patch 11  . ezetimibe (ZETIA) 10 MG tablet TAKE 1 TABLET (10 MG TOTAL) BY MOUTH DAILY. 90 tablet 3  . metFORMIN (GLUCOPHAGE-XR) 500 MG 24 hr tablet TAKE 1 TABLET BY MOUTH DAILY WITH A MEAL. (Patient taking differently: Take 500 mg by mouth daily with breakfast. ) 180 tablet 3  . rosuvastatin (CRESTOR) 20 MG tablet Take 1  tablet (20 mg total) by mouth at bedtime. 90 tablet 1  . Semaglutide-Weight Management (WEGOVY) 0.5 MG/0.5ML SOAJ Inject 0.5 mLs (0.5 mg total) into the skin every 7 (seven) days. 2 mL 0  . methocarbamol (ROBAXIN) 500 MG tablet Take 500 mg by mouth 2 (two) times daily at 10 AM and 5 PM.  (Patient not taking: Reported on 12/31/2019)     No current facility-administered medications for this visit.    Family History  Problem Relation Age of Onset  . Hypertension Mother   . Hypotension Mother   . Anemia Mother        low iron  . Heart disease Sister   . Sickle cell trait Other   . Alcohol abuse Brother   . Alcohol abuse Brother   . Aneurysm Brother   . Drug abuse Brother   . Colon cancer Neg Hx   . Esophageal cancer Neg Hx   . Stomach cancer Neg Hx   . Breast cancer Neg Hx     Review of Systems  Constitutional: Negative.   HENT: Negative.   Eyes: Negative.   Respiratory: Negative.   Cardiovascular: Negative.   Gastrointestinal: Negative.   Endocrine: Negative.   Genitourinary: Negative.   Musculoskeletal: Negative.   Skin: Negative.   Allergic/Immunologic: Negative.   Neurological: Negative.   Hematological: Negative.   Psychiatric/Behavioral: Negative.     Exam:   BP 138/72 (Cuff Size: Large)   Pulse 70   Resp 16   Ht 5' 4.5" (1.638 m)   Wt 200 lb (90.7 kg)   LMP  (LMP Unknown)   BMI 33.80 kg/m     General appearance: alert, cooperative and appears stated age Head: normocephalic, without obvious abnormality, atraumatic Neck: no adenopathy, supple, symmetrical, trachea midline and thyroid normal to inspection and palpation Lungs: clear to auscultation bilaterally Breasts: normal appearance, no masses or tenderness, No nipple retraction or dimpling, No nipple discharge or bleeding, No axillary adenopathy Heart: regular rate and rhythm Abdomen: soft, non-tender; no masses, no organomegaly Extremities: extremities normal, atraumatic, no cyanosis or edema Skin: skin  color, texture, turgor normal. No rashes or lesions Lymph nodes: cervical, supraclavicular, and axillary nodes normal. Neurologic: grossly normal  Pelvic: External genitalia:  no lesions              No abnormal inguinal nodes palpated.              Urethra:  normal appearing urethra with no masses, tenderness or lesions              Bartholins and Skenes: normal                 Vagina: normal appearing vagina with normal color and discharge, no lesions  Cervix: absent              Pap taken: Yes.   Bimanual Exam:  Uterus:  absent              Adnexa: no mass, fullness, tenderness              Rectal exam: Declined.   Chaperone was present for exam.  Assessment:   Well woman visit with normal exam. Status post hysterectomy. Status post BSO. Menopausal symptoms.   Hx VAIN I. Hx HSV 2. Overactive bladder. Yeast vulvitis.   Plan: Mammogram screening discussed. Self breast awareness reviewed. Pap and HR HPV as above. Guidelines for Calcium, Vitamin D, regular exercise program including cardiovascular and weight bearing exercise. Diflucan.  Rx for Vivelle dot 0.075 mg twice weekly.  She wishes to continue and not take oral ERT.  We discussed increased risk of stroke, DVT, PE. Follow up annually and prn.   After visit summary provided.

## 2019-12-31 ENCOUNTER — Other Ambulatory Visit (HOSPITAL_COMMUNITY)
Admission: RE | Admit: 2019-12-31 | Discharge: 2019-12-31 | Disposition: A | Discharge status: Home / Self Care | Payer: No Typology Code available for payment source | Source: Ambulatory Visit | Attending: Obstetrics and Gynecology | Admitting: Obstetrics and Gynecology

## 2019-12-31 ENCOUNTER — Other Ambulatory Visit: Payer: Self-pay

## 2019-12-31 ENCOUNTER — Other Ambulatory Visit: Payer: Self-pay | Admitting: Obstetrics and Gynecology

## 2019-12-31 ENCOUNTER — Ambulatory Visit (INDEPENDENT_AMBULATORY_CARE_PROVIDER_SITE_OTHER): Payer: No Typology Code available for payment source | Admitting: Obstetrics and Gynecology

## 2019-12-31 ENCOUNTER — Encounter: Payer: Self-pay | Admitting: Obstetrics and Gynecology

## 2019-12-31 VITALS — BP 138/72 | HR 70 | Resp 16 | Ht 64.5 in | Wt 200.0 lb

## 2019-12-31 DIAGNOSIS — Z01419 Encounter for gynecological examination (general) (routine) without abnormal findings: Secondary | ICD-10-CM

## 2019-12-31 MED ORDER — ESTRADIOL 0.075 MG/24HR TD PTTW
1.0000 | MEDICATED_PATCH | TRANSDERMAL | 3 refills | Status: DC
Start: 2019-12-31 — End: 2019-12-31

## 2019-12-31 MED ORDER — FLUCONAZOLE 150 MG PO TABS: 150.0000 mg | ORAL_TABLET | Freq: Once | ORAL | 0 refills | Status: DC

## 2019-12-31 MED FILL — ESTRADIOL 0.075 MG PATCH: 0.075 | 84 days supply | Qty: 24 | Fill #0

## 2019-12-31 MED FILL — FLUCONAZOLE 150 MG TABS: 150 | 3 days supply | Qty: 2 | Fill #0

## 2019-12-31 NOTE — Patient Instructions (Signed)

## 2020-01-04 LAB — CYTOLOGY - PAP
Comment: NEGATIVE
Diagnosis: NEGATIVE
High risk HPV: NEGATIVE

## 2020-01-07 ENCOUNTER — Ambulatory Visit: Payer: No Typology Code available for payment source

## 2020-01-08 ENCOUNTER — Other Ambulatory Visit: Payer: Self-pay

## 2020-01-08 ENCOUNTER — Encounter: Payer: Self-pay | Admitting: Internal Medicine

## 2020-01-08 ENCOUNTER — Ambulatory Visit: Payer: No Typology Code available for payment source | Admitting: Internal Medicine

## 2020-01-08 VITALS — BP 130/80 | HR 92 | Ht 64.5 in | Wt 199.2 lb

## 2020-01-08 DIAGNOSIS — R7989 Other specified abnormal findings of blood chemistry: Secondary | ICD-10-CM | POA: Diagnosis not present

## 2020-01-08 DIAGNOSIS — E785 Hyperlipidemia, unspecified: Secondary | ICD-10-CM | POA: Diagnosis not present

## 2020-01-08 DIAGNOSIS — E1165 Type 2 diabetes mellitus with hyperglycemia: Secondary | ICD-10-CM

## 2020-01-08 DIAGNOSIS — E538 Deficiency of other specified B group vitamins: Secondary | ICD-10-CM

## 2020-01-08 DIAGNOSIS — E559 Vitamin D deficiency, unspecified: Secondary | ICD-10-CM

## 2020-01-08 LAB — LIPID PANEL
Cholesterol: 186 mg/dL (ref 0–200)
HDL: 40.2 mg/dL (ref >39.00)
LDL Cholesterol: 123 mg/dL — ABNORMAL HIGH (ref 0–99)
NonHDL: 145.35
Total CHOL/HDL Ratio: 5
Triglycerides: 112 mg/dL (ref 0.0–149.0)
VLDL: 22.4 mg/dL (ref 0.0–40.0)

## 2020-01-08 NOTE — Progress Notes (Signed)
Patient ID: [name], female   DOB: 1960-05-29, 59 y.o.   MRN: [id number]  This visit occurred during the SARS-CoV-2 public health emergency.  Safety protocols were in place, including screening questions prior to the visit, additional usage of staff PPE, and extensive cleaning of exam room while observing appropriate contact time as indicated for disinfecting solutions.   HPI: [name] is a 59 y.o.-year-old female, presenting for f/u for DM2; prediabetes dx early 2000s, controlled, without long term complications, hyperlipidemia, subclinical thyrotoxicosis. Also, vitamin D deficiency and low vitamin B12.last visit 6 months ago.  She changed her diet and started to walk since last OV. Also, started [facility]. Sugars improved and her weight also starting to improve.  Reviewed HbA1c levels: Lab Results  Component Value Date   HGBA1C 5.9 12/18/2019   HGBA1C 6.1 09/15/2019   HGBA1C 6.5 (H) 04/30/2019  She had several steroid inj in the past- last in 04/2014.  She is on: - Metformin ER 500 mg 2x a day >> we had to decrease to once a day 2/2 GI discomfort >> 500 mg with dinner. She previously tried: - Tradjenta 5 mg in am, before b'fast  - Invokana 100 mg in am - started 03/2015 >> she had CP, HA, lightheadedness - regular metformin >> diarrhea/nausea with regular metformin.  Pt checks sugars twice a day: - am:  88-131 >> 88-95 >> 87-90s, 101 >> 88-102 >> 80s - 2h after b'fast:  n/c >> 90-125 >> 86-127 >> n/c - lunch: 95-100 >> 90s >> n/c >> 87-120 >> n/c - 2h after lunch:  95-100 >> n/c >> 86-118 >> n/c - before dinner:80-100 >> n/c >> 80-127 >> n/c - 2h after dinner: 98-103 >> 110-118 >> 112-115 >> 90s - bedtime:  85-100 >> n/c >> 97-121 >> n/c - nighttime: 89-116 Lowest sugar was  66....>> 87 >> 88 >> 80; it is unclear at which level she has hypoglycemia awareness.. Highest: 135 ... >> 118 >> 130 >> 98.  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  -No CKD: Lab Results  Component Value Date   BUN 14 07/28/2019   CREATININE 0.98 07/28/2019   Lab Results  Component Value Date   MICRALBCREAT 43 (H) 09/15/2019   MICRALBCREAT 30-300 10/07/2018   MICRALBCREAT 0.8 07/04/2017   MICRALBCREAT <5.6 12/11/2016   - last eye exam was in 05/2019: No DR, + cataract. [facility]. Saw Dr Claudean Kinds: + history of a corneal ulcer -+ Numbness, tingling, burning in her feet- related to cervical spondylosis -on Neurontin  HL: She has hypercholesterolemia: Lab Results  Component Value Date   CHOL 260 (H) 07/03/2019   CHOL 224 (H) 01/02/2019   CHOL 231 (H) 12/30/2017   Lab Results  Component Value Date   HDL 51.60 07/03/2019   HDL 47.20 01/02/2019   HDL 47.60 12/30/2017   Lab Results  Component Value Date   LDLCALC 190 (H) 07/03/2019   LDLCALC 158 (H) 01/02/2019   LDLCALC 164 (H) 12/30/2017   Lab Results  Component Value Date   TRIG 91.0 07/03/2019   TRIG 96.0 01/02/2019   TRIG 101.0 12/30/2017   Lab Results  Component Value Date   CHOLHDL 5 07/03/2019   CHOLHDL 5 01/02/2019  CHOLHDL 5 12/30/2017   We changed from Livalo to Crestor initially 5 mg daily and then increased to 10 mg daily in 06/2019. Now on Crestor to 20 mg daily and Zetia 10.  She had normal LFTs: Lab Results  Component Value Date   ALT 15 04/30/2019   AST 16 04/30/2019   ALKPHOS 88 04/30/2019   BILITOT 0.5 04/30/2019   Low vitamin B12 and D:  Reviewed her vitamin levels: Lab Results  Component Value Date   VD25OH 26.2 (L) 12/18/2019   VD25OH 22.72 (L) 07/03/2019   VD25OH 32.8 11/18/2018   VD25OH 16.8 (L) 02/07/2018   VD25OH 32.81 12/30/2017   VD25OH 23.26 (L) 07/04/2017   VD25OH 17.98 (L) 08/03/2016   VD25OH 9 (L)  05/13/2015   Lab Results  Component Value Date   VITAMINB12 397 12/18/2019   VITAMINB12 347 07/03/2019   VITAMINB12 584 11/18/2018   VITAMINB12 613 02/07/2018   VITAMINB12 390 12/30/2017   VITAMINB12 368 07/04/2017   VITAMINB12 315 08/03/2016   VITAMINB12 428 05/13/2015   VITAMINB12 496 01/22/2014    She is on: -B12 injections, previously p.o. B12 5000 units daily -now back to injections, but not compliant>> off  -Ergocalciferol 50,000 weekly>> stopped 04/2019 >> resumed  Subclinical thyrotoxicosis   Reviewed her TFTs: Lab Results  Component Value Date   TSH 0.565 12/18/2019   TSH 0.938 11/18/2018   TSH 0.42 07/04/2017   TSH 0.97 12/04/2016   TSH 0.34 (L) 08/03/2016   TSH 0.430 (L) 06/08/2016   TSH 0.77 05/13/2015   TSH 0.619 01/22/2014   TSH 0.730 10/14/2013   TSH 0.492 09/01/2012   Lab Results  Component Value Date   FREET4 0.70 07/04/2017   FREET4 0.77 12/04/2016   FREET4 0.79 08/03/2016   T3FREE 3.8 07/04/2017   T3FREE 3.4 12/04/2016   T3FREE 3.6 08/03/2016   She has a history of dysphagia and has had esophageal stretching in the past.  No new symptoms.  She has a history of migraines (related to neck pain, C5-C6 - will get a steroid inj soon), carpal tunnel, and peripheral neuropathy. She also has rotator cuff pbs.  She had cervical decompression surgery in 04/2019.   ROS: Constitutional: no weight gain/+ weight loss, no fatigue, no subjective hyperthermia, no subjective hypothermia Eyes: no blurry vision, no xerophthalmia ENT: no sore throat, no nodules palpated in neck, no dysphagia, no odynophagia, no hoarseness Cardiovascular: no CP/no SOB/no palpitations/no leg swelling Respiratory: no cough/no SOB/no wheezing Gastrointestinal: no N/no V/no D/no C/no acid reflux Musculoskeletal: no muscle aches/no joint aches, + foot Skin: no rashes, no hair loss Neurological: no tremors/+ numbness/+ tingling/no dizziness  I reviewed pt's medications, allergies,  PMH, social hx, family hx, and changes were documented in the history of present illness. Otherwise, unchanged from my initial visit note.  Past Medical History:  Diagnosis Date  . Anxiety   . 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 Brain tumor (benign) ([place]) 2018  . Chest pain 05/15/2015  . Complication of anesthesia   . Depression    in the past  . Diabetes mellitus without complication ([place])    Type II  . GERD (gastroesophageal reflux disease)    04/30/2019- "in the past"  . H/O varicella   . History of measles, mumps, or rubella   . HSV-2 (herpes simplex virus 2) infection   . Hx of ovarian cyst   . Hyperlipidemia   . Hypersomnia 07/19/2015  . Hypertension    "  sometimes"   . Migraine headache    "due to cervical issuses"  . Plantar fasciitis   . PONV (postoperative nausea and vomiting)   . Scapular dyskinesis 10/03/2016  . Sleep apnea    pt denies  . Sleep deprivation    loss of sleep  . 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 ABDOMINAL HYSTERECTOMY    . ANTERIOR CERVICAL DECOMP/DISCECTOMY FUSION N/A 05/07/2019   Procedure: ANTERIOR CERVICAL DECOMPRESSION FUSION CERVICAL 5-6, CERVICAL 6-7 WITH INSTRUMENTATION AND ALLOGRAFT;  Surgeon: Phylliss Bob, MD;  Location: [facility];  Service: Orthopedics;  Laterality: N/A;  . CHOLECYSTECTOMY  11/13/10  . COLONOSCOPY  Multiple  . DILATION AND CURETTAGE OF UTERUS    . ESOPHAGOGASTRODUODENOSCOPY    . OOPHORECTOMY  2009   bilateral salpingo-oophorectomy.  Marland Kitchen SHOULDER ARTHROSCOPY WITH ROTATOR CUFF REPAIR Right 08/03/2019   Procedure: SHOULDER ARTHROSCOPY WITH. ROTATOR CUFF DEBRIDEMENT, SUBACROMIAL DECOMPRESSION, DISTAL CLAVICLE EXCISION;  Surgeon: Tania Ade, MD;  Location: [facility];  Service: Orthopedics;  Laterality: Right;  . TUBAL LIGATION     Social History   Socioeconomic History  .  Marital status: Married    Spouse name: [name]  . Number of children: 2  . Years of education: 77  . Highest education level: Associate degree: academic program  Occupational History    Employer: [employer]  Tobacco Use  . Smoking status: Former Smoker    Packs/day: 0.50    Years: 30.00    Pack years: 15.00    Types: Cigarettes    Quit date: 2012    Years since quitting: 9.8  . Smokeless tobacco: Never Used  Vaping Use  . Vaping Use: Never used  Substance and Sexual Activity  . Alcohol use: No    Alcohol/week: 0.0 standard drinks  . Drug use: No  . Sexual activity: Yes    Partners: Male    Birth control/protection: Surgical    Comment: Hysterectomy  Other Topics Concern  . Not on file  Social History Narrative   Patient lives at home with her husband [name]). 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 Two children. Both boys      Patient is right-handed. She lives in a one level home. She drinks coffee occasionally. She does not regularly exercise.   Social Determinants of Health   Financial Resource Strain:   . Difficulty of Paying Living Expenses: Not on file  Food Insecurity:   . Worried About Charity fundraiser in the Last Year: Not on file  . Ran Out of Food in the Last Year: Not on file  Transportation Needs:   . Lack of Transportation (Medical): Not on file  . Lack of Transportation (Non-Medical): Not on file  Physical Activity:   . Days of Exercise per Week: Not on file  . Minutes of Exercise per Session: Not on file  Stress:   . Feeling of Stress : Not on file  Social Connections:   . Frequency of Communication with Friends and Family: Not on file  . Frequency of Social Gatherings with Friends and Family: Not on file  . Attends Religious Services: Not on file  . Active Member of Clubs or Organizations: Not on file  . Attends Archivist Meetings: Not on file  . Marital  Status: Not  on file  Intimate Partner Violence:   . Fear of Current or Ex-Partner: Not on file  . Emotionally Abused: Not on file  . Physically Abused: Not on file  . Sexually Abused: Not on file   Current Outpatient Medications on File Prior to Visit  Medication Sig Dispense Refill  . blood glucose meter kit and supplies Dispense based on patient and insurance preference. Use once a day as directed. (FOR ICD-10 E10.9, E11.9). 1 each 0  . Elastic Bandages & Supports (WRIST BRACE/LEFT MEDIUM) MISC 1 each by Does not apply route as needed. 1 each 0  . estradiol (VIVELLE-DOT) 0.075 MG/24HR Place 1 patch onto the skin 2 (two) times a week. 24 patch 3  . ezetimibe (ZETIA) 10 MG tablet TAKE 1 TABLET (10 MG TOTAL) BY MOUTH DAILY. 90 tablet 3  . metFORMIN (GLUCOPHAGE-XR) 500 MG 24 hr tablet TAKE 1 TABLET BY MOUTH DAILY WITH A MEAL. (Patient taking differently: Take 500 mg by mouth daily with breakfast. ) 180 tablet 3  . methocarbamol (ROBAXIN) 500 MG tablet Take 500 mg by mouth 2 (two) times daily at 10 AM and 5 PM.  (Patient not taking: Reported on 12/31/2019)    . rosuvastatin (CRESTOR) 20 MG tablet Take 1 tablet (20 mg total) by mouth at bedtime. 90 tablet 1  . Semaglutide-Weight Management (WEGOVY) 0.5 MG/0.5ML SOAJ Inject 0.5 mLs (0.5 mg total) into the skin every 7 (seven) days. 2 mL 0   No current facility-administered medications on file prior to visit.   Allergies  Allergen Reactions  . Sulfa Antibiotics Anaphylaxis, Hives and Swelling   Family History  Problem Relation Age of Onset  . Hypertension Mother   . Hypotension Mother   . Anemia Mother        low iron  . Heart disease Sister   . Sickle cell trait Other   . Alcohol abuse Brother   . Alcohol abuse Brother   . Aneurysm Brother   . Drug abuse Brother   . Colon cancer Neg Hx   . Esophageal cancer Neg Hx   . Stomach cancer Neg Hx   . Breast cancer Neg Hx     PE: BP 130/80   Pulse 92   Ht 5' 4.5" (1.638 m)   Wt 199  lb 3.2 oz (90.4 kg)   LMP  (LMP Unknown)   SpO2 96%   BMI 33.66 kg/m  Body mass index is 33.66 kg/m. Wt Readings from Last 3 Encounters:  01/08/20 199 lb 3.2 oz (90.4 kg)  12/31/19 200 lb (90.7 kg)  12/29/19 201 lb (91.2 kg)   Constitutional: overweight, in NAD Eyes: PERRLA, EOMI, no exophthalmos ENT: moist mucous membranes, no thyromegaly, no cervical lymphadenopathy Cardiovascular: RRR, No MRG Respiratory: CTA B Gastrointestinal: abdomen soft, NT, ND, BS+ Musculoskeletal: no deformities, strength intact in all 4 Skin: moist, warm, no rashes Neurological: no tremor with outstretched hands, DTR normal in all 4  ASSESSMENT: 1. DM2, non-insulin dependent, without long-term complications, but with hyperglycemia  2. HL  3. H/o Low TSH  4.  Low vitamin D   5. Low B12  PLAN:  1. Patient with history of controlled type 2 diabetes on low-dose Metformin, without side effects.  At last visit sugars were all at goal so we continued the same regimen.  Since last visit, she started [facility] for weight loss.  HbA1c before last visit was 6.5%, however, she had another HbA1c checked earlier this month and this was even lower, at  5.9%. -At today's visit, sugars are excellent, usually lower than 100, after starting semaglutide.  She is building up the dose.  At this point, we can go ahead and stop Metformin.  - I suggested to: Patient Instructions  Please stop: - Metformin ER   Continue: - NOMVEH  Also, continue: - Ergocalciferol 50,000 units weekly - B12 injections monthly - Crestor 10 mg daily - Zetia 10 mg daily  Please return in 6 months with your sugar log.   - advised to check sugars at different times of the day - 1x a day, rotating check times - advised for yearly eye exams >> she is UTD - return to clinic in 6 months   2. HL -Reviewed latest lipid panel from 06/2019: Very high LDL: Lab Results  Component Value Date   CHOL 260 (H) 07/03/2019   HDL 51.60 07/03/2019    LDLCALC 190 (H) 07/03/2019   LDLDIRECT 148 (H) 10/23/2019   TRIG 91.0 07/03/2019   CHOLHDL 5 07/03/2019  -At last visit she was on Livalo and Zetia but LDL was high so we switched to Crestor 5.  At last visit, since LDL was very high, I advised her to increase Crestor to 10 mg daily >> then 30 mg daily. Alo on Zetia 10 mg daily. -She will need a repeat lipid panel >> will check today (fasting except for coffee)  3. Low TSH -No thyrotoxic symptoms -She had a normal TSH at last check earlier this month: Lab Results  Component Value Date   TSH 0.565 12/18/2019   4. Low vitamin D  -She is not completely compliant with the 50,000 units of ergocalciferol weekly.  I again recommended that she took it every week.  However, she stopped it completely 2 months ago at least -Latest vitamin D level was low at last visit, at 22.7 and Iadvisedherto take her ergocalciferol 50,000 units every week consistently.  She did so.  Latest vitamin D level was improved to 26.2 in 12/2019.   -I advised her to continue the above dose of ergocalciferol and work on improving compliance  5. Low B12 -At last visit she was off injections completely.  I advised her to resume them. -At that time, B12 was in the low normal range -In the past she was not very compliant with B12 injections.  I did advise in the past to start p.o. B12 but she was forgetting these.  Now off inj's completely -Latest B12 level was improved to 397 in 12/2019 -Especially since we are stopping Metformin, which can decrease the B12 absorption, we can continue on B12 supplements for now  Component     Latest Ref Rng & Units 01/08/2020  Cholesterol     0 - 200 mg/dL 186  Triglycerides     0 - 149 mg/dL 112.0  HDL Cholesterol     >39.00 mg/dL 40.20  VLDL     0.0 - 40.0 mg/dL 22.4  LDL (calc)     0 - 99 mg/dL 123 (H)  Total CHOL/HDL Ratio      5  NonHDL      145.35   LDL much better, as are the rest of her fractions.  For now, we will  continue the current cholesterol regimen, continue to work on her diet.  Philemon Kingdom, MD PhD [facility] Endocrinology

## 2020-01-08 NOTE — Patient Instructions (Addendum)
Please stop: - Metformin ER   Continue: - AQTMAU  Also, continue: - Ergocalciferol 50,000 units weekly - B12 injections monthly - Crestor 10 mg daily - Zetia 10 mg daily  Please return in 6 months with your sugar log.

## 2020-01-14 ENCOUNTER — Other Ambulatory Visit: Payer: Self-pay | Admitting: Family Medicine

## 2020-01-15 ENCOUNTER — Other Ambulatory Visit: Payer: Self-pay | Admitting: Family Medicine

## 2020-01-15 ENCOUNTER — Encounter: Payer: Self-pay | Admitting: Family Medicine

## 2020-01-15 MED ORDER — WEGOVY 1 MG/0.5ML ~~LOC~~ SOAJ
1.0000 mg | SUBCUTANEOUS | 0 refills | Status: DC
Start: 2020-01-15 — End: 2020-03-04

## 2020-01-15 MED ORDER — WEGOVY 1 MG/0.5ML ~~LOC~~ SOAJ: 1.0000 mg | SUBCUTANEOUS | 0 refills | Status: DC

## 2020-01-15 NOTE — Progress Notes (Signed)
Would have completed 4 weeks of 0.5 mg of Wegovy.  Increased dose to 1 mg every week.  F/U as planned.

## 2020-01-15 NOTE — Telephone Encounter (Signed)
[facility] sent in a 7 weekly order instead of every 7 days.   New prescription sent in and I called the pharmacist Jinny Blossom) who confirmed cancellation of the initial script.

## 2020-01-26 ENCOUNTER — Encounter: Payer: Self-pay | Admitting: Family Medicine

## 2020-01-26 MED FILL — WEGOVY 1 MG/0.5ML SOAJ: 1 | 28 days supply | Qty: 2 | Fill #0

## 2020-01-29 ENCOUNTER — Other Ambulatory Visit: Payer: Self-pay

## 2020-01-29 ENCOUNTER — Ambulatory Visit
Admission: RE | Admit: 2020-01-29 | Discharge: 2020-01-29 | Disposition: A | Discharge status: Home / Self Care | Payer: No Typology Code available for payment source | Source: Ambulatory Visit | Attending: Obstetrics and Gynecology | Admitting: Obstetrics and Gynecology

## 2020-01-29 DIAGNOSIS — Z1231 Encounter for screening mammogram for malignant neoplasm of breast: Secondary | ICD-10-CM

## 2020-02-01 ENCOUNTER — Encounter: Payer: Self-pay | Admitting: Family Medicine

## 2020-02-01 NOTE — Telephone Encounter (Signed)
Called patient to follow up on myChart message. Patient reports symptom onset last Wednesday. Intermittent dizziness and headache. Patient states that this is different pain from migraines and has been taking acetaminophen with minimal improvement. Reports generalized fatigue.   Denies blurry vision, chest pain or SHOB. Scheduled patient with Dr. Erin Hearing tomorrow morning at 0910.  Precepted with Dr. Owens Shark who is agreeable with plan for patient to have close follow up in clinic tomorrow.   Strict ED/return precautions given.   Talbot Grumbling, RN

## 2020-02-02 ENCOUNTER — Other Ambulatory Visit: Payer: Self-pay

## 2020-02-02 ENCOUNTER — Encounter: Payer: Self-pay | Admitting: Family Medicine

## 2020-02-02 ENCOUNTER — Ambulatory Visit (INDEPENDENT_AMBULATORY_CARE_PROVIDER_SITE_OTHER): Payer: No Typology Code available for payment source | Admitting: Family Medicine

## 2020-02-02 DIAGNOSIS — R2689 Other abnormalities of gait and mobility: Secondary | ICD-10-CM | POA: Insufficient documentation

## 2020-02-02 DIAGNOSIS — E1169 Type 2 diabetes mellitus with other specified complication: Secondary | ICD-10-CM | POA: Diagnosis not present

## 2020-02-02 LAB — GLUCOSE, POCT (MANUAL RESULT ENTRY): POC Glucose: 81 mg/dl (ref 70–99)

## 2020-02-02 NOTE — Progress Notes (Signed)
° ° °  SUBJECTIVE:   CHIEF COMPLAINT / HPI:   IMBALANCE SENSATION For last few days has felt very mild headache, feelings of being off balance and runny nose.  Her blood pressure taken at home has been high 150s/90s.  Has never had hypertension.  No falls or focal weakness or loss of vision or chest pain or shortness of breath.  No new medications   PERTINENT  PMH / PSH: diabetes recently stopped metformin due to good control  OBJECTIVE:   BP 126/80    Pulse 82    Wt 200 lb 9.6 oz (91 kg)    LMP  (LMP Unknown)    SpO2 100%    BMI 33.90 kg/m   Psych:  Cognition and judgment appear intact. Alert, communicative  and cooperative with normal attention span and concentration. No apparent delusions, illusions, hallucinations Heart - Regular rate and rhythm.  No murmurs, gallops or rubs.    Lungs:  Normal respiratory effort, chest expands symmetrically. Lungs are clear to auscultation, no crackles or wheezes. Neurologic exam : Cn 2-7 intact Strength equal & normal in upper & lower extremities Able to walk on heels and toes.   Balance normal.  Very mild sway with Romberg  finger to nose normal Eye - Pupils Equal Round Reactive to light, Extraocular movements intact, Fundi without hemorrhage or visible lesions, Conjunctiva without redness or discharge Ears:  External ear exam shows no significant lesions or deformities.  Otoscopic examination reveals clear canals, tympanic membranes are intact bilaterally without bulging, retraction, inflammation or discharge. Hearing is grossly normal bilaterall Orthostatics are normal   ASSESSMENT/PLAN:   Imbalance Primarily a sensation of being off balance.  Normal neuro exam.  No signs of TIA or intracranial lesion.  Perhaps mild URI or allergies.  Asked to restart her antihistamine.  Her blood pressure in office is normal.  See after visit summary for instructions   Her blood sugar was normal    Lind Covert, MD [place]

## 2020-02-02 NOTE — Assessment & Plan Note (Signed)
Primarily a sensation of being off balance.  Normal neuro exam.  No signs of TIA or intracranial lesion.  Perhaps mild URI or allergies.  Asked to restart her antihistamine.  Her blood pressure in office is normal.  See after visit summary for instructions

## 2020-02-02 NOTE — Patient Instructions (Addendum)
Good to see you today!  Thanks for coming in.  I think you have a mild viral infection It should be better in 2 weeks I would try clariten to see if this helps If it is getting worse meaning  Focal weakness - arm or leg or visual loss or trouble speaking or worsening shortness of breath or chest pain or nearly falling Then call us right away  Your goal blood pressure is less than 140/90.  Check your blood pressure several times a week.  If regularly higher than this please let me know - either with MyChart or leaving a phone message. Next visit please bring in your blood pressure cuff.    Make an appointment to see Dr Gwendlyn Deutscher in 4-6 weeks,

## 2020-02-15 ENCOUNTER — Other Ambulatory Visit: Payer: Self-pay

## 2020-02-15 ENCOUNTER — Ambulatory Visit (INDEPENDENT_AMBULATORY_CARE_PROVIDER_SITE_OTHER): Payer: No Typology Code available for payment source | Admitting: Family Medicine

## 2020-02-15 ENCOUNTER — Other Ambulatory Visit: Payer: Self-pay | Admitting: Family Medicine

## 2020-02-15 VITALS — BP 120/80 | HR 84 | Temp 99.0°F | Ht 64.0 in

## 2020-02-15 DIAGNOSIS — R059 Cough, unspecified: Secondary | ICD-10-CM | POA: Diagnosis not present

## 2020-02-15 DIAGNOSIS — B9789 Other viral agents as the cause of diseases classified elsewhere: Secondary | ICD-10-CM

## 2020-02-15 DIAGNOSIS — J988 Other specified respiratory disorders: Secondary | ICD-10-CM | POA: Diagnosis not present

## 2020-02-15 MED ORDER — BENZONATATE 200 MG PO CAPS: 200.0000 mg | ORAL_CAPSULE | Freq: Two times a day (BID) | ORAL | 0 refills | Status: DC | PRN

## 2020-02-15 MED FILL — BENZONATATE 200 MG CAPS: 200 | 10 days supply | Qty: 20 | Fill #0

## 2020-02-15 NOTE — Progress Notes (Signed)
   SUBJECTIVE:   CHIEF COMPLAINT / HPI:   Chief Complaint  Patient presents with  . Cough    since last Wed.     [name] is a 59 y.o. female here for ongoing cough.   States she woke up last Wed with a sore throat, with elevated temp 99.4 F. [facility] sent her for COVID test that was negative that same day. She has been feeling achy for the past 2 days. Coughing all night, productive (green/yellow sputum). Took cold-EZ lozenges, NyQuil without much relief.  Endorses decreased appetite, sleep disturbance, frontal headache, fatigue. Denies recent sick contacts. COVID vaccinated including booster.     PERTINENT  PMH / PSH: reviewed and updated as appropriate   OBJECTIVE:   BP 120/80   Pulse 84   Temp 99 F (37.2 C) (Oral)   Ht 5\' 4"  (1.626 m)   LMP  (LMP Unknown)   SpO2 99%   BMI 34.43 kg/m    GEN: pleasant female, in no acute distress  EYES: no scleral injection  HENT: moist mucus membranes, no oropharyngeal lesions, no nasal discharge  NECK: no lymphadenopathy, normal ROM  CV: regular rate and rhythm, no murmurs appreciated  RESP: no increased work of breathing, clear to ascultation bilaterally with no crackles, wheezes, or rhonchi SKIN: warm, dry, well perfused     ASSESSMENT/PLAN:   Viral respiratory illness Not likely pneumonia given clear lungs and lack of fever. Recent COVID test negative however she was testing early in her illness. Can not rule out influenza. Recommended getting retested. Of note, she is COVID and influenza vaccinated. Outside of the Tamiflu window. Tessalon Perles for cough. ED precautions given. Patient to notify [facility].      Lyndee Hensen, DO PGY-2, [facility] 02/15/2020

## 2020-02-15 NOTE — Patient Instructions (Signed)
It was great seeing you today!   I recommend you getting COVID tested again. Stop the pharmacy to pick up your prescription. If you have any shortness of breath or chest pain go to the emergency department.    If you have questions or concerns please do not hesitate to call at [phone].  Dr. Rushie [facility]

## 2020-02-16 NOTE — Assessment & Plan Note (Signed)
Not likely pneumonia given clear lungs and lack of fever. Recent COVID test negative however she was testing early in her illness. Can not rule out influenza. Recommended getting retested. Of note, she is COVID and influenza vaccinated. Outside of the Tamiflu window. Tessalon Perles for cough. ED precautions given. Patient to notify [facility].

## 2020-02-18 ENCOUNTER — Other Ambulatory Visit: Payer: Self-pay

## 2020-02-18 ENCOUNTER — Ambulatory Visit (INDEPENDENT_AMBULATORY_CARE_PROVIDER_SITE_OTHER): Payer: No Typology Code available for payment source | Admitting: Family Medicine

## 2020-02-18 VITALS — BP 118/64 | HR 84

## 2020-02-18 DIAGNOSIS — J00 Acute nasopharyngitis [common cold]: Secondary | ICD-10-CM

## 2020-02-18 DIAGNOSIS — B9789 Other viral agents as the cause of diseases classified elsewhere: Secondary | ICD-10-CM | POA: Diagnosis not present

## 2020-02-18 DIAGNOSIS — J988 Other specified respiratory disorders: Secondary | ICD-10-CM

## 2020-02-18 MED ORDER — FLUTICASONE PROPIONATE 50 MCG/ACT NA SUSP: 1.0000 | Freq: Every day | NASAL | 2 refills | Status: DC

## 2020-02-18 MED FILL — FLUTICASONE PROP 50 MCG SPR: 50 | 60 days supply | Qty: 16 | Fill #0

## 2020-02-18 NOTE — Assessment & Plan Note (Signed)
Most likely viral URI, expect resolution in 1-6 days (total of 7-14 days of symptoms). VSS, afebrile. Repeat COVID testing earlier this week in setting of fully vaccinated decreases likelihood of COVID-19. Unable to r/o influenza, but she is vaccinated as of 12/18/19. Well outside of tamiflu window, can consider getting tested for flu (do not have tests in clinic). Gave flonase for nasal congestion and post-nasal drip causing sore throat. Recommend staying out of work through the weekend, but if symptoms are improved and no fever within 24 hours of Monday 12/6, she may return to work. ED and RTC precautions reviewed with patient.

## 2020-02-18 NOTE — Patient Instructions (Addendum)
It was a pleasure to see you today!  You most likely have a viral upper respiratory infection. I recommend you use flonase to help open up your sinuses and decrease throat soreness in the morning. Use this spray (one in each nostril) once in the morning. This medication works best when used regularly. Return to care if you have a temperature over 100.4*F or are not improved in the next week. If you have worsening shortness of breath, unable to keep down any fluids, or a fever that is not responsive to tylenol or NSAIDs (ibuprofen, advil, motrin, etc), please call us [phone] or seek care at the nearest emergency department.  I recommend staying out of work through at least the weekend and if your symptoms are improved with no fever within 24 hours of Monday morning, you may return to work.   Be Well,  Dr. Chauncey Reading

## 2020-02-18 NOTE — Progress Notes (Signed)
    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  Primary symptom: sore throat, runny nose, mild cough, myalgia Duration: 8 days Severity:mild-moderate Associated symptoms: elevated temp to 99.8*F at home, afebrile in office Fever? Tmax?: see above Sick contacts: not that she knows of Covid test: yes on 11/24 and 11/29, both negative Covid vaccination(s): Pfizer series completed in April and got flu shot in October.    PERTINENT  PMH / PSH: non-contributory  OBJECTIVE:  Nursing note and vitals reviewed BP 118/64   Pulse 84   LMP  (LMP Unknown)   SpO2 98%   GEN: middle-aged AA woman resting comfortably in chair, NAD HEENT: PERRL. Sclera without injection or icterus. MMM. Mildly erythematous oropharynx, no exudate. Sinus passages are similarly erythematous with clear, thin secretions. Neck: Supple, no LAD Cardiac: Regular rate and rhythm. Normal S1/S2. No murmurs, rubs, or gallops appreciated. Lungs: Clear bilaterally to ascultation, no w/r/r, no increased WOB. Psych: Pleasant and appropriate   ASSESSMENT/PLAN:   Viral respiratory illness Most likely viral URI, expect resolution in 1-6 days (total of 7-14 days of symptoms). VSS, afebrile. Repeat COVID testing earlier this week in setting of fully vaccinated decreases likelihood of COVID-19. Unable to r/o influenza, but she is vaccinated as of 12/18/19. Well outside of tamiflu window, can consider getting tested for flu (do not have tests in clinic). Gave flonase for nasal congestion and post-nasal drip causing sore throat. Recommend staying out of work through the weekend, but if symptoms are improved and no fever within 24 hours of Monday 12/6, she may return to work. ED and RTC precautions reviewed with patient.     Gladys Damme, MD [place]

## 2020-03-04 ENCOUNTER — Ambulatory Visit (INDEPENDENT_AMBULATORY_CARE_PROVIDER_SITE_OTHER): Payer: No Typology Code available for payment source | Admitting: Family Medicine

## 2020-03-04 ENCOUNTER — Encounter: Payer: Self-pay | Admitting: Family Medicine

## 2020-03-04 ENCOUNTER — Other Ambulatory Visit: Payer: Self-pay

## 2020-03-04 ENCOUNTER — Other Ambulatory Visit: Payer: Self-pay | Admitting: Family Medicine

## 2020-03-04 VITALS — BP 118/82 | HR 97 | Ht 64.0 in | Wt 197.0 lb

## 2020-03-04 DIAGNOSIS — Z23 Encounter for immunization: Secondary | ICD-10-CM

## 2020-03-04 DIAGNOSIS — M25562 Pain in left knee: Secondary | ICD-10-CM

## 2020-03-04 DIAGNOSIS — G8929 Other chronic pain: Secondary | ICD-10-CM

## 2020-03-04 DIAGNOSIS — M79672 Pain in left foot: Secondary | ICD-10-CM | POA: Diagnosis not present

## 2020-03-04 HISTORY — DX: Pain in left knee: M25.562

## 2020-03-04 MED ORDER — IBUPROFEN 400 MG PO TABS: 400.0000 mg | ORAL_TABLET | Freq: Three times a day (TID) | ORAL | 3 refills | Status: DC | PRN

## 2020-03-04 MED ORDER — WEGOVY 1.7 MG/0.75ML ~~LOC~~ SOAJ: 1.7000 mg | SUBCUTANEOUS | 0 refills | Status: DC

## 2020-03-04 MED FILL — IBUPROFEN 400 MG TABS: 400 | 30 days supply | Qty: 90 | Fill #0

## 2020-03-04 MED FILL — WEGOVY 1.7 MG/0.75ML SOAJ: 1.7 | 28 days supply | Qty: 3 | Fill #0

## 2020-03-04 NOTE — Progress Notes (Signed)
    SUBJECTIVE:   CHIEF COMPLAINT / HPI:  Knee pain: Left knee pain started many years ago. Now worsening. She is here for f/u.  Toe pain: She is here to f/u for her left big toe pain, which started > 3 months ago. Pain is about 10/10 in severity whenever it hurts. She is currently not hurting. Denies recent injury. Pain occurs any time of the day--minimal improvement with Tylenol.  Weight management: Doing well on Wegovy 1mg  weekly. She is happy with the result.  PERTINENT  PMH / PSH: PMX reviewed.  OBJECTIVE:   Vitals:   03/04/20 0920  BP: 118/82  Pulse: 97  SpO2: 97%  Weight: 197 lb (89.4 kg)  Height: 5\' 4"  (1.626 m)    Physical Exam Vitals and nursing note reviewed.  Cardiovascular:     Rate and Rhythm: Normal rate and regular rhythm.     Heart sounds: Normal heart sounds. No murmur heard.   Pulmonary:     Effort: Pulmonary effort is normal. No respiratory distress.     Breath sounds: Normal breath sounds. No wheezing.  Musculoskeletal:     Right knee: Normal.     Left knee: No swelling, deformity, erythema or bony tenderness. Normal range of motion.     Left foot: Normal range of motion. No deformity.  Feet:     Left foot:     Skin integrity: No erythema.     Comments: Non-tender, left big toe and foot. No erythema     ASSESSMENT/PLAN:   Knee pain, left Chronic pain. ?? Arthritis. Xray ordered. Will benefit from PT. Start Ibuprofen 400 mg prn. F/U soon if there is no improvement.  Left foot pain ?? Arthritis or gout. Recent uric acid level was normal. Xray discussed and it was ordered.  Morbid obesity ([place]) Weight down from 201 to 197 She was commended on a job well done. Increase Wegovy to 1.7 mg x 4 weeks and then 2.4 mg weekly. Monitor weight closely.    Tdap given. She already got her COVID-19 booster shot.  Andrena Mews, MD [place]

## 2020-03-04 NOTE — Assessment & Plan Note (Signed)
Weight down from 201 to 197 She was commended on a job well done. Increase Wegovy to 1.7 mg x 4 weeks and then 2.4 mg weekly. Monitor weight closely.

## 2020-03-04 NOTE — Patient Instructions (Signed)
Journal for Nurse Practitioners, 15(4), 263-267. Retrieved December 23, 2017 from http://clinicalkey.com/nursing">  Knee Exercises Ask your health care provider which exercises are safe for you. Do exercises exactly as told by your health care provider and adjust them as directed. It is normal to feel mild stretching, pulling, tightness, or discomfort as you do these exercises. Stop right away if you feel sudden pain or your pain gets worse. Do not begin these exercises until told by your health care provider. Stretching and range-of-motion exercises These exercises warm up your muscles and joints and improve the movement and flexibility of your knee. These exercises also help to relieve pain and swelling. Knee extension, prone 1. Lie on your abdomen (prone position) on a bed. 2. Place your left / right knee just beyond the edge of the surface so your knee is not on the bed. You can put a towel under your left / right thigh just above your kneecap for comfort. 3. Relax your leg muscles and allow gravity to straighten your knee (extension). You should feel a stretch behind your left / right knee. 4. Hold this position for __________ seconds. 5. Scoot up so your knee is supported between repetitions. Repeat __________ times. Complete this exercise __________ times a day. Knee flexion, active  1. Lie on your back with both legs straight. If this causes back discomfort, bend your left / right knee so your foot is flat on the floor. 2. Slowly slide your left / right heel back toward your buttocks. Stop when you feel a gentle stretch in the front of your knee or thigh (flexion). 3. Hold this position for __________ seconds. 4. Slowly slide your left / right heel back to the starting position. Repeat __________ times. Complete this exercise __________ times a day. Quadriceps stretch, prone  1. Lie on your abdomen on a firm surface, such as a bed or padded floor. 2. Bend your left / right knee and hold  your ankle. If you cannot reach your ankle or pant leg, loop a belt around your foot and grab the belt instead. 3. Gently pull your heel toward your buttocks. Your knee should not slide out to the side. You should feel a stretch in the front of your thigh and knee (quadriceps). 4. Hold this position for __________ seconds. Repeat __________ times. Complete this exercise __________ times a day. Hamstring, supine 1. Lie on your back (supine position). 2. Loop a belt or towel over the ball of your left / right foot. The ball of your foot is on the walking surface, right under your toes. 3. Straighten your left / right knee and slowly pull on the belt to raise your leg until you feel a gentle stretch behind your knee (hamstring). ? Do not let your knee bend while you do this. ? Keep your other leg flat on the floor. 4. Hold this position for __________ seconds. Repeat __________ times. Complete this exercise __________ times a day. Strengthening exercises These exercises build strength and endurance in your knee. Endurance is the ability to use your muscles for a long time, even after they get tired. Quadriceps, isometric This exercise stretches the muscles in front of your thigh (quadriceps) without moving your knee joint (isometric). 1. Lie on your back with your left / right leg extended and your other knee bent. Put a rolled towel or small pillow under your knee if told by your health care provider. 2. Slowly tense the muscles in the front of your left /   right thigh. You should see your kneecap slide up toward your hip or see increased dimpling just above the knee. This motion will push the back of the knee toward the floor. 3. For __________ seconds, hold the muscle as tight as you can without increasing your pain. 4. Relax the muscles slowly and completely. Repeat __________ times. Complete this exercise __________ times a day. Straight leg raises This exercise stretches the muscles in front  of your thigh (quadriceps) and the muscles that move your hips (hip flexors). 1. Lie on your back with your left / right leg extended and your other knee bent. 2. Tense the muscles in the front of your left / right thigh. You should see your kneecap slide up or see increased dimpling just above the knee. Your thigh may even shake a bit. 3. Keep these muscles tight as you raise your leg 4-6 inches (10-15 cm) off the floor. Do not let your knee bend. 4. Hold this position for __________ seconds. 5. Keep these muscles tense as you lower your leg. 6. Relax your muscles slowly and completely after each repetition. Repeat __________ times. Complete this exercise __________ times a day. Hamstring, isometric 1. Lie on your back on a firm surface. 2. Bend your left / right knee about __________ degrees. 3. Dig your left / right heel into the surface as if you are trying to pull it toward your buttocks. Tighten the muscles in the back of your thighs (hamstring) to "dig" as hard as you can without increasing any pain. 4. Hold this position for __________ seconds. 5. Release the tension gradually and allow your muscles to relax completely for __________ seconds after each repetition. Repeat __________ times. Complete this exercise __________ times a day. Hamstring curls If told by your health care provider, do this exercise while wearing ankle weights. Begin with __________ lb weights. Then increase the weight by 1 lb (0.5 kg) increments. Do not wear ankle weights that are more than __________ lb. 1. Lie on your abdomen with your legs straight. 2. Bend your left / right knee as far as you can without feeling pain. Keep your hips flat against the floor. 3. Hold this position for __________ seconds. 4. Slowly lower your leg to the starting position. Repeat __________ times. Complete this exercise __________ times a day. Squats This exercise strengthens the muscles in front of your thigh and knee  (quadriceps). 1. Stand in front of a table, with your feet and knees pointing straight ahead. You may rest your hands on the table for balance but not for support. 2. Slowly bend your knees and lower your hips like you are going to sit in a chair. ? Keep your weight over your heels, not over your toes. ? Keep your lower legs upright so they are parallel with the table legs. ? Do not let your hips go lower than your knees. ? Do not bend lower than told by your health care provider. ? If your knee pain increases, do not bend as low. 3. Hold the squat position for __________ seconds. 4. Slowly push with your legs to return to standing. Do not use your hands to pull yourself to standing. Repeat __________ times. Complete this exercise __________ times a day. Wall slides This exercise strengthens the muscles in front of your thigh and knee (quadriceps). 1. Lean your back against a smooth wall or door, and walk your feet out 18-24 inches (46-61 cm) from it. 2. Place your feet hip-width apart. 3.   Slowly slide down the wall or door until your knees bend __________ degrees. Keep your knees over your heels, not over your toes. Keep your knees in line with your hips. 4. Hold this position for __________ seconds. Repeat __________ times. Complete this exercise __________ times a day. Straight leg raises This exercise strengthens the muscles that rotate the leg at the hip and move it away from your body (hip abductors). 1. Lie on your side with your left / right leg in the top position. Lie so your head, shoulder, knee, and hip line up. You may bend your bottom knee to help you keep your balance. 2. Roll your hips slightly forward so your hips are stacked directly over each other and your left / right knee is facing forward. 3. Leading with your heel, lift your top leg 4-6 inches (10-15 cm). You should feel the muscles in your outer hip lifting. ? Do not let your foot drift forward. ? Do not let your knee  roll toward the ceiling. 4. Hold this position for __________ seconds. 5. Slowly return your leg to the starting position. 6. Let your muscles relax completely after each repetition. Repeat __________ times. Complete this exercise __________ times a day. Straight leg raises This exercise stretches the muscles that move your hips away from the front of the pelvis (hip extensors). 1. Lie on your abdomen on a firm surface. You can put a pillow under your hips if that is more comfortable. 2. Tense the muscles in your buttocks and lift your left / right leg about 4-6 inches (10-15 cm). Keep your knee straight as you lift your leg. 3. Hold this position for __________ seconds. 4. Slowly lower your leg to the starting position. 5. Let your leg relax completely after each repetition. Repeat __________ times. Complete this exercise __________ times a day. This information is not intended to replace advice given to you by your health care provider. Make sure you discuss any questions you have with your health care provider. Document Revised: 12/24/2017 Document Reviewed: 12/24/2017 Elsevier Patient Education  2020 Elsevier Inc.  

## 2020-03-04 NOTE — Assessment & Plan Note (Signed)
??   Arthritis or gout. Recent uric acid level was normal. Xray discussed and it was ordered.

## 2020-03-04 NOTE — Assessment & Plan Note (Signed)
Chronic pain. ?? Arthritis. Xray ordered. Will benefit from PT. Start Ibuprofen 400 mg prn. F/U soon if there is no improvement.

## 2020-03-07 ENCOUNTER — Other Ambulatory Visit: Payer: Self-pay | Admitting: Family Medicine

## 2020-03-07 NOTE — Telephone Encounter (Signed)
Need to update dose in 4 week. I will escribed med in 4 weeks. Please update her pharmacy on file.

## 2020-03-10 NOTE — Telephone Encounter (Signed)
Pharmacy has been updated.   Talbot Grumbling, RN

## 2020-03-15 ENCOUNTER — Other Ambulatory Visit: Payer: Self-pay

## 2020-03-15 ENCOUNTER — Ambulatory Visit (HOSPITAL_COMMUNITY)
Admission: RE | Admit: 2020-03-15 | Discharge: 2020-03-15 | Disposition: A | Discharge status: Home / Self Care | Payer: No Typology Code available for payment source | Source: Ambulatory Visit | Attending: Family Medicine | Admitting: Family Medicine

## 2020-03-15 ENCOUNTER — Telehealth: Payer: Self-pay | Admitting: Family Medicine

## 2020-03-15 DIAGNOSIS — M25562 Pain in left knee: Secondary | ICD-10-CM | POA: Diagnosis present

## 2020-03-15 DIAGNOSIS — M79672 Pain in left foot: Secondary | ICD-10-CM | POA: Insufficient documentation

## 2020-03-15 DIAGNOSIS — G8929 Other chronic pain: Secondary | ICD-10-CM | POA: Diagnosis present

## 2020-03-15 NOTE — Telephone Encounter (Signed)
Please advise. A higher dose may predispose to [employer] or gi ulcer. I can prescribe celebrex which has less GI effect if she is ok with that. Let me know.

## 2020-03-15 NOTE — Telephone Encounter (Addendum)
HIPAA compliant callback message left.  Please advise her that her toe xray showed mild arthritis. Knee xray is normal. Continue Ibuprofen as prescribed for toe pain. F/U soon if there is no improvement.

## 2020-03-15 NOTE — Telephone Encounter (Signed)
Patient returns call to nurse line. Informed of below results. Patient is requesting an increase dosage of ibuprofen, as current dosage is not helping her pain.   Please advise.   Veronda Prude, RN

## 2020-03-16 ENCOUNTER — Telehealth: Payer: Self-pay | Admitting: Family Medicine

## 2020-03-16 ENCOUNTER — Other Ambulatory Visit: Payer: Self-pay | Admitting: Family Medicine

## 2020-03-16 MED ORDER — WEGOVY 1.7 MG/0.75ML ~~LOC~~ SOAJ: 1.7000 mg | SUBCUTANEOUS | 0 refills | Status: DC

## 2020-03-16 MED ORDER — IBUPROFEN 600 MG PO TABS: 600.0000 mg | ORAL_TABLET | Freq: Three times a day (TID) | ORAL | 2 refills | Status: DC | PRN

## 2020-03-16 MED FILL — IBUPROFEN 600 MG TABLET: 600 | 30 days supply | Qty: 90 | Fill #0

## 2020-03-16 NOTE — Telephone Encounter (Signed)
I spoke with the patient. I was unable to escribe Celebrex since she has allergy to Sulfa.I increased her Ibuprofen from 400 mg to 600 mg instead and she agreed with that plan.  She mentioned that she inadvertently wasted two doses of her weekly Wegovy shot and will need a refill. Refill sent to [facility] at her request.

## 2020-03-16 NOTE — Telephone Encounter (Signed)
Called patient to discuss. Patient is agreeable to celebrex prescription. Patient requests rx be sent to [facility].   Veronda Prude, RN

## 2020-03-16 NOTE — Telephone Encounter (Signed)
HIPAA compliant callback message left. I am unable to prescribe her Celebrex given her Sulfa allergy.  I increased her Ibuprofen from 400 mg to 600 mg instead. Please advise her when she returns my call.

## 2020-03-16 NOTE — Telephone Encounter (Signed)
Patient returning Dr Phebe Colla call. Please call patient back.

## 2020-03-21 ENCOUNTER — Telehealth: Payer: Self-pay | Admitting: Family Medicine

## 2020-03-21 NOTE — Telephone Encounter (Signed)
Error

## 2020-03-30 ENCOUNTER — Other Ambulatory Visit: Payer: Self-pay | Admitting: Family Medicine

## 2020-03-30 MED ORDER — SEMAGLUTIDE-WEIGHT MANAGEMENT 2.4 MG/0.75ML ~~LOC~~ SOAJ: 2.4000 mg | SUBCUTANEOUS | 3 refills | Status: DC

## 2020-04-26 ENCOUNTER — Ambulatory Visit (INDEPENDENT_AMBULATORY_CARE_PROVIDER_SITE_OTHER): Payer: No Typology Code available for payment source | Admitting: Family Medicine

## 2020-04-26 ENCOUNTER — Other Ambulatory Visit: Payer: Self-pay

## 2020-04-26 ENCOUNTER — Encounter: Payer: Self-pay | Admitting: Family Medicine

## 2020-04-26 VITALS — BP 124/82 | HR 91 | Ht 64.0 in | Wt 186.4 lb

## 2020-04-26 DIAGNOSIS — M79672 Pain in left foot: Secondary | ICD-10-CM

## 2020-04-26 DIAGNOSIS — E1169 Type 2 diabetes mellitus with other specified complication: Secondary | ICD-10-CM | POA: Diagnosis not present

## 2020-04-26 DIAGNOSIS — E8881 Metabolic syndrome: Secondary | ICD-10-CM | POA: Diagnosis not present

## 2020-04-26 DIAGNOSIS — R42 Dizziness and giddiness: Secondary | ICD-10-CM

## 2020-04-26 DIAGNOSIS — E785 Hyperlipidemia, unspecified: Secondary | ICD-10-CM

## 2020-04-26 HISTORY — DX: Dizziness and giddiness: R42

## 2020-04-26 NOTE — Assessment & Plan Note (Signed)
Continue [id number]. Continue home CBG monitoring. F/U for A1C as planned.

## 2020-04-26 NOTE — Progress Notes (Signed)
    SUBJECTIVE:   CHIEF COMPLAINT / HPI:   Toe pain:  Here to follow-up for her left foot pain. She is yet to pick up her Ibuprofen 600 mg dose. Pain is intermittent, currently asymptomatic. No recent injury.  Dizziness:  Lightheaded x 3 months. This occurs with getting up suddenly from a sitting position or with a certain change in posture. She denies any recent falls. Her CBG checked at the time ranges between the 80s and 90s. BP checked at home is all over the place. No bleeding.  DM2/HLD:Doing well on Wegovy. She is compliant with Zetia 10 mg QD and Crestor 20 mg QD.   PERTINENT  PMH / PSH: PMX revieved  OBJECTIVE:   Vitals:   04/26/20 0908 04/26/20 0910 04/26/20 0912 04/26/20 0914  BP: 119/80 125/87 (!) 125/97 124/82  Pulse: 88 93 100 91  SpO2:      Weight:      Height:       Physical Exam Vitals and nursing note reviewed.  Constitutional:      Appearance: Normal appearance.  Cardiovascular:     Rate and Rhythm: Normal rate and regular rhythm.     Pulses:          Dorsalis pedis pulses are 2+ on the right side and 2+ on the left side.     Heart sounds: Normal heart sounds. No murmur heard.   Pulmonary:     Effort: Pulmonary effort is normal. No respiratory distress.     Breath sounds: Normal breath sounds. No wheezing.  Abdominal:     General: Abdomen is flat. Bowel sounds are normal. There is no distension.     Palpations: Abdomen is soft.     Tenderness: There is no abdominal tenderness.  Musculoskeletal:     Right lower leg: No edema.     Left lower leg: No edema.     Right foot: Normal range of motion.     Left foot: Normal range of motion.  Feet:     Comments: No tenderness or erythema of her feet Neurological:     General: No focal deficit present.     Mental Status: She is oriented to person, place, and time.     Motor: No weakness.     Coordination: Coordination normal.     Gait: Gait normal.  Psychiatric:        Mood and Affect: Mood normal.       Flowsheet Row Office Visit from 04/26/2020 in [place]  PHQ-9 Total Score 0       ASSESSMENT/PLAN:   Left foot pain Likely OA. Her recent xray showed 1 metatarsal DJD. I encouraged her to start her Ibuprofen 600 mg prn. PT offered, but she declined. Monitor closely.  Dizziness Likely BPPV Became symptomatic when I made her lie down and get up from the examination table. No neurologic deficit Orthostatic vitals looks assuring. CBC and Bmet checked. Will hold off of Meclizine. Monitor closely for now.   DM2 (diabetes mellitus, type 2) ([place]) Continue Wegovy. Continue home CBG monitoring. F/U for A1C as planned.  Hyperlipidemia She is compliant with her meds. She had a recent increase in the dose of her Crestor following an improvement of her LDL. Will recheck FLP in about 2-3 months. She agreed with the plan.     Andrena Mews, MD [place]

## 2020-04-26 NOTE — Patient Instructions (Signed)

## 2020-04-26 NOTE — Assessment & Plan Note (Signed)
She is compliant with her meds. She had a recent increase in the dose of her Crestor following an improvement of her LDL. Will recheck FLP in about 2-3 months. She agreed with the plan.

## 2020-04-26 NOTE — Assessment & Plan Note (Signed)
Likely BPPV Became symptomatic when I made her lie down and get up from the examination table. No neurologic deficit Orthostatic vitals looks assuring. CBC and Bmet checked. Will hold off of Meclizine. Monitor closely for now.

## 2020-04-26 NOTE — Assessment & Plan Note (Signed)
Likely OA. Her recent xray showed 1 metatarsal DJD. I encouraged her to start her Ibuprofen 600 mg prn. PT offered, but she declined. Monitor closely.

## 2020-04-27 ENCOUNTER — Telehealth: Payer: Self-pay | Admitting: Family Medicine

## 2020-04-27 ENCOUNTER — Telehealth: Payer: Self-pay

## 2020-04-27 LAB — CMP14+EGFR
ALT: 12 IU/L (ref 0–32)
AST: 17 IU/L (ref 0–40)
Albumin/Globulin Ratio: 1.5 (ref 1.2–2.2)
Albumin: 4.2 g/dL (ref 3.8–4.9)
Alkaline Phosphatase: 103 IU/L (ref 44–121)
BUN/Creatinine Ratio: 10 (ref 9–23)
BUN: 11 mg/dL (ref 6–24)
Bilirubin Total: <0.2 mg/dL (ref 0.0–1.2)
CO2: 26 mmol/L (ref 20–29)
Calcium: 9.2 mg/dL (ref 8.7–10.2)
Chloride: 102 mmol/L (ref 96–106)
Creatinine, Ser: 1.08 mg/dL — ABNORMAL HIGH (ref 0.57–1.00)
GFR calc Af Amer: 65 mL/min/{1.73_m2} (ref >59)
GFR calc non Af Amer: 57 mL/min/{1.73_m2} — ABNORMAL LOW (ref >59)
Globulin, Total: 2.8 g/dL (ref 1.5–4.5)
Glucose: 80 mg/dL (ref 65–99)
Potassium: 3.7 mmol/L (ref 3.5–5.2)
Sodium: 141 mmol/L (ref 134–144)
Total Protein: 7 g/dL (ref 6.0–8.5)

## 2020-04-27 LAB — CBC
Hematocrit: 38 % (ref 34.0–46.6)
Hemoglobin: 12.7 g/dL (ref 11.1–15.9)
MCH: 28.3 pg (ref 26.6–33.0)
MCHC: 33.4 g/dL (ref 31.5–35.7)
MCV: 85 fL (ref 79–97)
Platelets: 364 10*3/uL (ref 150–450)
RBC: 4.49 x10E6/uL (ref 3.77–5.28)
RDW: 13.3 % (ref 11.7–15.4)
WBC: 5.8 10*3/uL (ref 3.4–10.8)

## 2020-04-27 NOTE — Telephone Encounter (Signed)
Received fax from pharmacy, PA needed on Wegovy. Clinical questions submitted via Cover My Meds. Waiting on response, could take up to 72 hours.  Cover My Meds info: Key: [id number]

## 2020-04-27 NOTE — Telephone Encounter (Signed)
Result discussed. Mild AKI. Hydration recommended.  She continues to have early morning headache. Similar to her previous migraine headache. Used Topamax and MgOxide in the past.  MRI of her brain in 2020 was normal.  I discussed restarting MgOxide with Ibuprofen or Tylenol prn. She can take med prior to going to sleep at night.  She also endorses that she does not sleep well at night. This might be contributing to her headache.  Repeat imaging discussed. Defer to later if there is no improvement of her symptoms.  She will f/u soon to discuss next step if there is no improvement.

## 2020-04-29 NOTE — Telephone Encounter (Signed)
The medication has been approved. See below for details. The pharmacy has been updated.

## 2020-05-06 ENCOUNTER — Other Ambulatory Visit (HOSPITAL_COMMUNITY): Payer: Self-pay | Admitting: Surgical

## 2020-05-06 MED FILL — predniSONE 10 MG TABS: 10 | 6 days supply | Qty: 21 | Fill #0

## 2020-06-07 ENCOUNTER — Ambulatory Visit (INDEPENDENT_AMBULATORY_CARE_PROVIDER_SITE_OTHER): Payer: No Typology Code available for payment source | Admitting: Family Medicine

## 2020-06-07 ENCOUNTER — Encounter: Payer: Self-pay | Admitting: Family Medicine

## 2020-06-07 ENCOUNTER — Other Ambulatory Visit: Payer: Self-pay

## 2020-06-07 VITALS — BP 135/91 | HR 82 | Wt 183.0 lb

## 2020-06-07 DIAGNOSIS — R6889 Other general symptoms and signs: Secondary | ICD-10-CM

## 2020-06-07 DIAGNOSIS — F331 Major depressive disorder, recurrent, moderate: Secondary | ICD-10-CM

## 2020-06-07 DIAGNOSIS — G43809 Other migraine, not intractable, without status migrainosus: Secondary | ICD-10-CM | POA: Diagnosis not present

## 2020-06-07 DIAGNOSIS — E8889 Other specified metabolic disorders: Secondary | ICD-10-CM

## 2020-06-07 DIAGNOSIS — R030 Elevated blood-pressure reading, without diagnosis of hypertension: Secondary | ICD-10-CM

## 2020-06-07 DIAGNOSIS — C719 Malignant neoplasm of brain, unspecified: Secondary | ICD-10-CM

## 2020-06-07 NOTE — Assessment & Plan Note (Signed)
Recurrent. No neurologic deficit on exam. Continue Tylenol as needed. F/U soon or to the ED if no improvement.

## 2020-06-07 NOTE — Progress Notes (Signed)
    SUBJECTIVE:   CHIEF COMPLAINT / HPI:   Cold intolerance:  C/O feeling cold even when the house temperature is high. She needed to put on a space heater and multiple layers of cloth to feel better. She feels cold and chills to her bones. She thinks she might have anemia. She feels tired all the time.  Headaches/elevated BP:  She stated that her headache returned about 1- 2 weeks ago. She now has daily morning headaches as before. She initially thought that it might be related to her sinus, but she is uncertain. She usually has a runny nose in the AM and evenings. Her frontal headache responds well to Tylenol prn. She denies vision change, no N/V. No stress. She lost her older brother two weeks ago to COPD. Otherwise, she is doing well without stress or anxiety. BP elevated during this visit.  Hx of MDD: She denies depression or anxiety. No SI or HI.   PERTINENT  PMH / PSH: PMX reviewed.  OBJECTIVE:   Vitals:   06/07/20 0914 06/07/20 0934 06/07/20 0936  BP: (!) 140/92 (!) 133/93 (!) 135/91  Pulse: 82    SpO2: 94%    Weight: 183 lb (83 kg)      Physical Exam Vitals and nursing note reviewed.  Cardiovascular:     Rate and Rhythm: Normal rate and regular rhythm.     Heart sounds: Normal heart sounds. No murmur heard.   Pulmonary:     Effort: Pulmonary effort is normal. No respiratory distress.     Breath sounds: Normal breath sounds. No wheezing or rhonchi.  Abdominal:     General: Abdomen is flat. Bowel sounds are normal. There is no distension.     Palpations: Abdomen is soft. There is no mass.     Tenderness: There is no abdominal tenderness.  Musculoskeletal:     Cervical back: Neck supple.     Right lower leg: No edema.     Left lower leg: No edema.      ASSESSMENT/PLAN:  Cold intolerance: ?? Related to her weight loss from > 200 to 180 due to loss of fat insulator as well as reduced metabolic rate. Recent hemoglobin within normal. She prefers recheck. CBC  and TSH checked today. I will contact her with the result.  Migraine headache Recurrent. No neurologic deficit on exam. Continue Tylenol as needed. F/U soon or to the ED if no improvement.  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 She had extensive neuro evaluation by the neurologist in the past who suggested this is not related to her headache. F/U soon with neuro discussed.  Elevated BP without diagnosis of hypertension BP repeated and her systolic improved a bit and diastolic remains elevated. She is not interested in started antihypertensive agents. Lifestyle modification discussed. Already doing well with weight loss. F/U in 4 weeks for reassessment.  Major depressive disorder, recurrent episode, moderate ([place]) Denies depression and wants this off her record. I deleted this off her problem list. [place] Office Visit from 06/07/2020 in [place]  PHQ-9 Total Score [place], MD [place]

## 2020-06-07 NOTE — Assessment & Plan Note (Signed)
I am uncertain if this is related to her headache. She had extensive neuro evaluation by the neurologist in the past who suggested this is not related to her headache. F/U soon with neuro discussed.

## 2020-06-07 NOTE — Patient Instructions (Signed)
Blood Pressure Record Sheet To take your blood pressure, you will need a blood pressure machine. You can buy a blood pressure machine (blood pressure monitor) at your clinic, drug store, or online. When choosing one, consider:  An automatic monitor that has an arm cuff.  A cuff that wraps snugly around your upper arm. You should be able to fit only one finger between your arm and the cuff.  A device that stores blood pressure reading results.  Do not choose a monitor that measures your blood pressure from your wrist or finger. Follow your health care provider's instructions for how to take your blood pressure. To use this form:  Get one reading in the morning (a.m.) before you take any medicines.  Get one reading in the evening (p.m.) before supper.  Take at least 2 readings with each blood pressure check. This makes sure the results are correct. Wait 1-2 minutes between measurements.  Write down the results in the spaces on this form.  Repeat this once a week, or as told by your health care provider.  Make a follow-up appointment with your health care provider to discuss the results. Blood pressure log Date: _______________________  a.m. _____________________(1st reading) _____________________(2nd reading)  p.m. _____________________(1st reading) _____________________(2nd reading) Date: _______________________  a.m. _____________________(1st reading) _____________________(2nd reading)  p.m. _____________________(1st reading) _____________________(2nd reading) Date: _______________________  a.m. _____________________(1st reading) _____________________(2nd reading)  p.m. _____________________(1st reading) _____________________(2nd reading) Date: _______________________  a.m. _____________________(1st reading) _____________________(2nd reading)  p.m. _____________________(1st reading) _____________________(2nd reading) Date: _______________________  a.m.  _____________________(1st reading) _____________________(2nd reading)  p.m. _____________________(1st reading) _____________________(2nd reading) This information is not intended to replace advice given to you by your health care provider. Make sure you discuss any questions you have with your health care provider. Document Revised: 06/24/2019 Document Reviewed: 06/24/2019 Elsevier Patient Education  2021 Elsevier Inc.  

## 2020-06-07 NOTE — Assessment & Plan Note (Signed)
BP repeated and her systolic improved a bit and diastolic remains elevated. She is not interested in started antihypertensive agents. Lifestyle modification discussed. Already doing well with weight loss. F/U in 4 weeks for reassessment.

## 2020-06-07 NOTE — Assessment & Plan Note (Signed)
Denies depression and wants this off her record. I deleted this off her problem list. [place] Office Visit from 06/07/2020 in [place]  PHQ-9 Total Score 0

## 2020-06-07 NOTE — Assessment & Plan Note (Signed)
Her weight continues to trend down.

## 2020-06-07 NOTE — Assessment & Plan Note (Signed)
Weight down trending.

## 2020-06-08 LAB — CBC WITH DIFFERENTIAL/PLATELET
Basophils Absolute: 0 10*3/uL (ref 0.0–0.2)
Basos: 1 %
EOS (ABSOLUTE): 0.2 10*3/uL (ref 0.0–0.4)
Eos: 3 %
Hematocrit: 37 % (ref 34.0–46.6)
Hemoglobin: 12.4 g/dL (ref 11.1–15.9)
Immature Grans (Abs): 0 10*3/uL (ref 0.0–0.1)
Immature Granulocytes: 0 %
Lymphocytes Absolute: 3.1 10*3/uL (ref 0.7–3.1)
Lymphs: 52 %
MCH: 29 pg (ref 26.6–33.0)
MCHC: 33.5 g/dL (ref 31.5–35.7)
MCV: 86 fL (ref 79–97)
Monocytes Absolute: 0.4 10*3/uL (ref 0.1–0.9)
Monocytes: 6 %
Neutrophils Absolute: 2.2 10*3/uL (ref 1.4–7.0)
Neutrophils: 38 %
Platelets: 353 10*3/uL (ref 150–450)
RBC: 4.28 x10E6/uL (ref 3.77–5.28)
RDW: 13.4 % (ref 11.7–15.4)
WBC: 5.9 10*3/uL (ref 3.4–10.8)

## 2020-06-08 LAB — TSH: TSH: 0.572 u[IU]/mL (ref 0.450–4.500)

## 2020-06-17 LAB — HM DIABETES EYE EXAM

## 2020-06-20 ENCOUNTER — Encounter: Payer: Self-pay | Admitting: Family Medicine

## 2020-06-21 ENCOUNTER — Encounter: Payer: Self-pay | Admitting: Family Medicine

## 2020-06-22 ENCOUNTER — Other Ambulatory Visit (HOSPITAL_COMMUNITY): Payer: Self-pay

## 2020-06-22 MED ORDER — ACETAMINOPHEN-CODEINE #3 300-30 MG PO TABS
1.0000 | ORAL_TABLET | Freq: Four times a day (QID) | ORAL | 0 refills | Status: DC | PRN
  Filled 2020-06-22: qty 20, 5d supply, fill #0

## 2020-06-23 ENCOUNTER — Other Ambulatory Visit (HOSPITAL_COMMUNITY): Payer: Self-pay

## 2020-06-27 ENCOUNTER — Encounter (HOSPITAL_COMMUNITY): Payer: Self-pay

## 2020-06-27 ENCOUNTER — Other Ambulatory Visit (HOSPITAL_COMMUNITY): Payer: Self-pay

## 2020-06-27 ENCOUNTER — Ambulatory Visit (HOSPITAL_COMMUNITY)
Admission: RE | Admit: 2020-06-27 | Discharge: 2020-06-27 | Disposition: A | Discharge status: Home / Self Care | Payer: No Typology Code available for payment source | Source: Ambulatory Visit | Attending: Family Medicine | Admitting: Family Medicine

## 2020-06-27 ENCOUNTER — Other Ambulatory Visit: Payer: Self-pay

## 2020-06-27 ENCOUNTER — Encounter: Payer: Self-pay | Admitting: Family Medicine

## 2020-06-27 VITALS — BP 159/92 | HR 81 | Temp 98.0°F | Resp 18

## 2020-06-27 DIAGNOSIS — J069 Acute upper respiratory infection, unspecified: Secondary | ICD-10-CM

## 2020-06-27 DIAGNOSIS — R030 Elevated blood-pressure reading, without diagnosis of hypertension: Secondary | ICD-10-CM | POA: Diagnosis not present

## 2020-06-27 MED ORDER — AMLODIPINE BESYLATE 5 MG PO TABS
5.0000 mg | ORAL_TABLET | Freq: Every day | ORAL | 1 refills | Status: DC
Start: 2020-06-27 — End: 2020-08-31
  Filled 2020-06-27: qty 30, 30d supply, fill #0
  Filled 2020-08-02: qty 30, 30d supply, fill #1

## 2020-06-27 NOTE — Discharge Instructions (Addendum)
For runny nose/nasal congestion you may begin taking over the counter daily loratadine.  Your blood pressure was noted to be elevated during your visit today. If you are currently taking medication for high blood pressure, please ensure you are taking this as directed. If you do not have a history of high blood pressure and your blood pressure remains persistently elevated, you may need to begin taking a medication at some point. You may return here within the next few days to recheck if unable to see your primary care provider or if you do not have a one.  BP (!) 159/92 (BP Location: Right Arm)   Pulse 81   Temp 98 F (36.7 C) (Oral)   Resp 18   LMP  (LMP Unknown)   SpO2 100%   BP Readings from Last 3 Encounters:  06/27/20 (!) 159/92  06/07/20 (!) 135/91  04/26/20 124/82

## 2020-06-27 NOTE — Telephone Encounter (Signed)
Patient calls nurse line regarding high blood pressure and scheduling appointment. Patient does report associated headache with elevated BP readings. Advised patient that as we do not have any appointments until Thursday, she should be evaluated in UC. Patient verbalizes understanding.   Talbot Grumbling, RN

## 2020-06-27 NOTE — ED Triage Notes (Signed)
Pt presents with chronic elevated blood pressure with headache & dizziness for past weeks.

## 2020-06-27 NOTE — ED Provider Notes (Signed)
[name]   [id number] 06/27/20 Arrival Time: 0165  ASSESSMENT & PLAN:  1. Elevated blood pressure reading without diagnosis of hypertension   2. Viral URI with cough    Would like to initiate HTN treatment today. Trend shows her blood pressures above normal. See AVS for written discharge information.  Begin: Meds ordered this encounter  Medications  . amLODipine (NORVASC) 5 MG tablet    Sig: Take 1 tablet (5 mg total) by mouth daily.    Dispense:  30 tablet    Refill:  1   Likely coming down with viral URI beginning yesterday. Discussed time to recover and no cure for viruses. May use OTC Claritin daily. She questions whether some symptoms related to allergies.   Follow-up Information    Sharon Feil, MD.   Specialty: Family Medicine Why: Keep your scheduled follow up to recheck and monitor your blood pressure. Contact information: [place] [address] [facility].   Specialty: Emergency Medicine Why: If your blood pressure or symptoms worsen in any way. Contact information: [address] [id number] [place] [phone]               Discharge Instructions      For runny nose/nasal congestion you may begin taking over the counter daily loratadine.  Your blood pressure was noted to be elevated during your visit today. If you are currently taking medication for high blood pressure, please ensure you are taking this as directed. If you do not have a history of high blood pressure and your blood pressure remains persistently elevated, you may need to begin taking a medication at some point. You may return here within the next few days to recheck if unable to see your primary care provider or if you do not have a one.  BP (!) 159/92 (BP Location: Right Arm)   Pulse 81   Temp 98 F (36.7 C) (Oral)   Resp 18   LMP  (LMP  Unknown)   SpO2 100%   BP Readings from Last 3 Encounters:  06/27/20 (!) 159/92  06/07/20 (!) 135/91  04/26/20 124/82       Reviewed expectations re: course of current medical issues. Questions answered. Outlined signs and symptoms indicating need for more acute intervention. Patient verbalized understanding. After Visit Summary given.   SUBJECTIVE:  [name] is a 60 y.o. female who presents with concerns regarding increased blood pressures. She reports that she has not been treated for hypertension in the past. Checking pressures at home and at work; mostly borderline to mildly elevated. She reports no chest pain on exertion, no dyspnea on exertion, no swelling of ankles, no orthopnea or paroxysmal nocturnal dyspnea and no palpitations. Transient lightheaded feeling over past week or two. No limitations of daily activities.  Social History   Tobacco Use  Smoking Status Former Smoker  . Packs/day: 0.50  . Years: 30.00  . Pack years: 15.00  . Types: Cigarettes  . Quit date: 2012  . Years since quitting: 10.2  Smokeless Tobacco Never Used   Also reports nasal congestion/post-nasal drainage along with body aches and coughing; abrupt onset yesterday. Frontal headache. Normal PO intake without n/v/d.  OBJECTIVE:  Vitals:   06/27/20 1022  BP: (!) 159/92  Pulse: 81  Resp: 18  Temp: 98 F (36.7 C)  TempSrc: Oral  SpO2: 100%    General appearance:  alert; no distress Eyes: PERRLA; EOMI HENT: normocephalic; atraumatic; throat mild erythema/cobblestoning; with nasal congestion Neck: supple Lungs: unlabored respirations; occas coughing Heart: regular Abdomen: soft, non-tender; bowel sounds normal Extremities: no edema; symmetrical with no gross deformities Skin: warm and dry Psychological: alert and cooperative; normal mood and affect   Allergies  Allergen Reactions  . Sulfa Antibiotics Anaphylaxis, Hives and Swelling    Past Medical History:   Diagnosis Date  . Anxiety   . 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 Brain tumor (benign) ([place]) 2018  . Chest pain 05/15/2015  . Complication of anesthesia   . Depression    in the past  . Diabetes mellitus without complication ([place])    Type II  . GERD (gastroesophageal reflux disease)    04/30/2019- "in the past"  . H/O varicella   . History of measles, mumps, or rubella   . HSV-2 (herpes simplex virus 2) infection   . Hx of ovarian cyst   . Hyperlipidemia   . Hypersomnia 07/19/2015  . Hypertension    "sometimes"   . Migraine headache    "due to cervical issuses"  . Plantar fasciitis   . PONV (postoperative nausea and vomiting)   . Scapular dyskinesis 10/03/2016  . Sleep apnea    pt denies  . Sleep deprivation    loss of sleep  . VAIN I (vaginal intraepithelial neoplasia grade I) 2013   pap and confirmed by colposcopic biopsy   Social History   Socioeconomic History  . Marital status: Married    Spouse name: [name]  . Number of children: 2  . Years of education: 27  . Highest education level: Associate degree: academic program  Occupational History    Employer: [employer]  Tobacco Use  . Smoking status: Former Smoker    Packs/day: 0.50    Years: 30.00    Pack years: 15.00    Types: Cigarettes    Quit date: 2012    Years since quitting: 10.2  . Smokeless tobacco: Never Used  Vaping Use  . Vaping Use: Never used  Substance and Sexual Activity  . Alcohol use: No    Alcohol/week: 0.0 standard drinks  . Drug use: No  . Sexual activity: Yes    Partners: Male    Birth control/protection: Surgical    Comment: Hysterectomy  Other Topics Concern  . Not on file  Social History Narrative   Patient lives at home with her husband [name]). 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 Two children. Both boys      Patient is right-handed. She lives in a one level home. She drinks coffee  occasionally. She does not regularly exercise.   Social Determinants of Health   Financial Resource Strain: Not on file  Food Insecurity: Not on file  Transportation Needs: Not on file  Physical Activity: Not on file  Stress: Not on file  Social Connections: Not on file  Intimate Partner Violence: Not on file   Family History  Problem Relation Age of Onset  . Hypertension Mother   . Hypotension Mother   . Anemia Mother        low iron  . Heart disease Sister   . Sickle cell trait Other   . Alcohol abuse Brother   . Alcohol abuse Brother   . Aneurysm Brother   . Drug abuse Brother   . Colon  cancer Neg Hx   . Esophageal cancer Neg Hx   . Stomach cancer Neg Hx   . Breast cancer Neg Hx    Past Surgical History:  Procedure Laterality Date  . ABDOMINAL HYSTERECTOMY    . ANTERIOR CERVICAL DECOMP/DISCECTOMY FUSION N/A 05/07/2019   Procedure: ANTERIOR CERVICAL DECOMPRESSION FUSION CERVICAL 5-6, CERVICAL 6-7 WITH INSTRUMENTATION AND ALLOGRAFT;  Surgeon: Phylliss Bob, MD;  Location: [facility];  Service: Orthopedics;  Laterality: N/A;  . CHOLECYSTECTOMY  11/13/10  . COLONOSCOPY  Multiple  . DILATION AND CURETTAGE OF UTERUS    . ESOPHAGOGASTRODUODENOSCOPY    . OOPHORECTOMY  2009   bilateral salpingo-oophorectomy.  Marland Kitchen SHOULDER ARTHROSCOPY WITH ROTATOR CUFF REPAIR Right 08/03/2019   Procedure: SHOULDER ARTHROSCOPY WITH. ROTATOR CUFF DEBRIDEMENT, SUBACROMIAL DECOMPRESSION, DISTAL CLAVICLE EXCISION;  Surgeon: Tania Ade, MD;  Location: [facility];  Service: Orthopedics;  Laterality: Right;  . TUBAL LIGATION        Vanessa Kick, MD 06/27/20 1134

## 2020-06-30 ENCOUNTER — Encounter: Payer: Self-pay | Admitting: Family Medicine

## 2020-07-08 ENCOUNTER — Ambulatory Visit (INDEPENDENT_AMBULATORY_CARE_PROVIDER_SITE_OTHER): Payer: No Typology Code available for payment source | Admitting: Family Medicine

## 2020-07-08 ENCOUNTER — Encounter: Payer: Self-pay | Admitting: Family Medicine

## 2020-07-08 ENCOUNTER — Other Ambulatory Visit: Payer: Self-pay

## 2020-07-08 ENCOUNTER — Ambulatory Visit (INDEPENDENT_AMBULATORY_CARE_PROVIDER_SITE_OTHER): Payer: No Typology Code available for payment source

## 2020-07-08 ENCOUNTER — Other Ambulatory Visit (HOSPITAL_COMMUNITY): Payer: Self-pay

## 2020-07-08 VITALS — BP 123/81 | Ht 64.0 in | Wt 180.2 lb

## 2020-07-08 DIAGNOSIS — J329 Chronic sinusitis, unspecified: Secondary | ICD-10-CM

## 2020-07-08 DIAGNOSIS — E785 Hyperlipidemia, unspecified: Secondary | ICD-10-CM | POA: Diagnosis not present

## 2020-07-08 DIAGNOSIS — E8889 Other specified metabolic disorders: Secondary | ICD-10-CM

## 2020-07-08 DIAGNOSIS — Z23 Encounter for immunization: Secondary | ICD-10-CM

## 2020-07-08 DIAGNOSIS — I10 Essential (primary) hypertension: Secondary | ICD-10-CM | POA: Diagnosis not present

## 2020-07-08 DIAGNOSIS — E1169 Type 2 diabetes mellitus with other specified complication: Secondary | ICD-10-CM

## 2020-07-08 LAB — POCT GLYCOSYLATED HEMOGLOBIN (HGB A1C): HbA1c, POC (controlled diabetic range): 5.8 % (ref 0.0–7.0)

## 2020-07-08 MED ORDER — AZITHROMYCIN 250 MG PO TABS
ORAL_TABLET | ORAL | 0 refills | Status: DC
  Filled 2020-07-08: qty 6, 5d supply, fill #0

## 2020-07-08 NOTE — Progress Notes (Signed)
    SUBJECTIVE:   CHIEF COMPLAINT / HPI:   HTN/HLD/DM2: Here for f/u. No concern. She is complaint with all meds. She started Norvasc a few days ago. She came in with her home BP log for the past few days.  Weight management: Off Wegovy for 3 weeks. She is yet to pick up her refills from the pharmacy.  URI/Sinus: Started more than a week ago, almost 2 weeks. Coughing, sinus drainage and hoarseness. Voice improved this week. No fever. No SOB, no chest pain.   PERTINENT  PMH / PSH: PMX  OBJECTIVE:   Vitals:   07/08/20 0837 07/08/20 0850  BP: 120/70 123/81  SpO2: 100%   Weight: 180 lb 4 oz (81.8 kg)   Height: 5\' 4"  (1.626 m)     Physical Exam Vitals and nursing note reviewed.  Cardiovascular:     Rate and Rhythm: Normal rate and regular rhythm.     Heart sounds: Normal heart sounds. No murmur heard.   Pulmonary:     Effort: Pulmonary effort is normal.     Breath sounds: Normal breath sounds. No wheezing.  Abdominal:     General: Abdomen is flat. Bowel sounds are normal. There is no distension.     Palpations: Abdomen is soft. There is no mass.     Tenderness: There is no abdominal tenderness.  Musculoskeletal:     Right lower leg: No edema.     Left lower leg: No edema.      ASSESSMENT/PLAN:   Essential hypertension Now on Norvasc 5 mg. Continue same.  Hyperlipidemia Deferring last to her visit with endo tomorrow. Continue Crestor and Zetia.  DM2 (diabetes mellitus, type 2) (HCC) A1C looks great today. Continue Wegogy for weight management  Morbid obesity ([place]) Weight keeps going in the right direction. She will pick up [facility] soon.  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 She will pick up [facility] soon.   Sinusitis:  ?? Associated bronchitis Lungs sounds clear. Zpak prescribed. F/U soon if no improvement.  Andrena Mews, MD [place]

## 2020-07-08 NOTE — Assessment & Plan Note (Signed)
Deferring last to her visit with endo tomorrow. Continue Crestor and Zetia.

## 2020-07-08 NOTE — Assessment & Plan Note (Signed)
Now on Norvasc 5 mg. Continue same.

## 2020-07-08 NOTE — Assessment & Plan Note (Signed)
A1C looks great today. Continue Wegogy for weight management

## 2020-07-08 NOTE — Patient Instructions (Signed)
Managing Your Hypertension Hypertension, also called high blood pressure, is when the force of the blood pressing against the walls of the arteries is too strong. Arteries are blood vessels that carry blood from your heart throughout your body. Hypertension forces the heart to work harder to pump blood and may cause the arteries to become narrow or stiff. Understanding blood pressure readings Your personal target blood pressure may vary depending on your medical conditions, your age, and other factors. A blood pressure reading includes a higher number over a lower number. Ideally, your blood pressure should be below 120/80. You should know that:  The first, or top, number is called the systolic pressure. It is a measure of the pressure in your arteries as your heart beats.  The second, or bottom number, is called the diastolic pressure. It is a measure of the pressure in your arteries as the heart relaxes. Blood pressure is classified into four stages. Based on your blood pressure reading, your health care provider may use the following stages to determine what type of treatment you need, if any. Systolic pressure and diastolic pressure are measured in a unit called mmHg. Normal  Systolic pressure: below 120.  Diastolic pressure: below 80. Elevated  Systolic pressure: 120-129.  Diastolic pressure: below 80. Hypertension stage 1  Systolic pressure: 130-139.  Diastolic pressure: 80-89. Hypertension stage 2  Systolic pressure: 140 or above.  Diastolic pressure: 90 or above. How can this condition affect me? Managing your hypertension is an important responsibility. Over time, hypertension can damage the arteries and decrease blood flow to important parts of the body, including the brain, heart, and kidneys. Having untreated or uncontrolled hypertension can lead to:  A heart attack.  A stroke.  A weakened blood vessel (aneurysm).  Heart failure.  Kidney damage.  Eye  damage.  Metabolic syndrome.  Memory and concentration problems.  Vascular dementia. What actions can I take to manage this condition? Hypertension can be managed by making lifestyle changes and possibly by taking medicines. Your health care provider will help you make a plan to bring your blood pressure within a normal range. Nutrition  Eat a diet that is high in fiber and potassium, and low in salt (sodium), added sugar, and fat. An example eating plan is called the Dietary Approaches to Stop Hypertension (DASH) diet. To eat this way: ? Eat plenty of fresh fruits and vegetables. Try to fill one-half of your plate at each meal with fruits and vegetables. ? Eat whole grains, such as whole-wheat pasta, brown rice, or whole-grain bread. Fill about one-fourth of your plate with whole grains. ? Eat low-fat dairy products. ? Avoid fatty cuts of meat, processed or cured meats, and poultry with skin. Fill about one-fourth of your plate with lean proteins such as fish, chicken without skin, beans, eggs, and tofu. ? Avoid pre-made and processed foods. These tend to be higher in sodium, added sugar, and fat.  Reduce your daily sodium intake. Most people with hypertension should eat less than 1,500 mg of sodium a day.   Lifestyle  Work with your health care provider to maintain a healthy body weight or to lose weight. Ask what an ideal weight is for you.  Get at least 30 minutes of exercise that causes your heart to beat faster (aerobic exercise) most days of the week. Activities may include walking, swimming, or biking.  Include exercise to strengthen your muscles (resistance exercise), such as weight lifting, as part of your weekly exercise routine. Try   to do these types of exercises for 30 minutes at least 3 days a week.  Do not use any products that contain nicotine or tobacco, such as cigarettes, e-cigarettes, and chewing tobacco. If you need help quitting, ask your health care  provider.  Control any long-term (chronic) conditions you have, such as high cholesterol or diabetes.  Identify your sources of stress and find ways to manage stress. This may include meditation, deep breathing, or making time for fun activities.   Alcohol use  Do not drink alcohol if: ? Your health care provider tells you not to drink. ? You are pregnant, may be pregnant, or are planning to become pregnant.  If you drink alcohol: ? Limit how much you use to:  0-1 drink a day for women.  0-2 drinks a day for men. ? Be aware of how much alcohol is in your drink. In the [place] one drink equals one 12 oz bottle of beer (355 mL), one 5 oz glass of wine (148 mL), or one 1 oz glass of hard liquor (44 mL). Medicines Your health care provider may prescribe medicine if lifestyle changes are not enough to get your blood pressure under control and if:  Your systolic blood pressure is 130 or higher.  Your diastolic blood pressure is 80 or higher. Take medicines only as told by your health care provider. Follow the directions carefully. Blood pressure medicines must be taken as told by your health care provider. The medicine does not work as well when you skip doses. Skipping doses also puts you at risk for problems. Monitoring Before you monitor your blood pressure:  Do not smoke, drink caffeinated beverages, or exercise within 30 minutes before taking a measurement.  Use the bathroom and empty your bladder (urinate).  Sit quietly for at least 5 minutes before taking measurements. Monitor your blood pressure at home as told by your health care provider. To do this:  Sit with your back straight and supported.  Place your feet flat on the floor. Do not cross your legs.  Support your arm on a flat surface, such as a table. Make sure your upper arm is at heart level.  Each time you measure, take two or three readings one minute apart and record the results. You may also need to have your  blood pressure checked regularly by your health care provider.   General information  Talk with your health care provider about your diet, exercise habits, and other lifestyle factors that may be contributing to hypertension.  Review all the medicines you take with your health care provider because there may be side effects or interactions.  Keep all visits as told by your health care provider. Your health care provider can help you create and adjust your plan for managing your high blood pressure. Where to find more information  National Heart, Lung, and Blood Institute: www.nhlbi.nih.gov  American Heart Association: www.heart.org Contact a health care provider if:  You think you are having a reaction to medicines you have taken.  You have repeated (recurrent) headaches.  You feel dizzy.  You have swelling in your ankles.  You have trouble with your vision. Get help right away if:  You develop a severe headache or confusion.  You have unusual weakness or numbness, or you feel faint.  You have severe pain in your chest or abdomen.  You vomit repeatedly.  You have trouble breathing. These symptoms may represent a serious problem that is an emergency. Do not wait   to see if the symptoms will go away. Get medical help right away. Call your local emergency services (911 in the [place].S[place] Do not drive yourself to the hospital. Summary  Hypertension is when the force of blood pumping through your arteries is too strong. If this condition is not controlled, it may put you at risk for serious complications.  Your personal target blood pressure may vary depending on your medical conditions, your age, and other factors. For most people, a normal blood pressure is less than 120/80.  Hypertension is managed by lifestyle changes, medicines, or both.  Lifestyle changes to help manage hypertension include losing weight, eating a healthy, low-sodium diet, exercising more, stopping smoking, and  limiting alcohol. This information is not intended to replace advice given to you by your health care provider. Make sure you discuss any questions you have with your health care provider. Document Revised: 04/10/2019 Document Reviewed: 02/03/2019 Elsevier Patient Education  2021 Elsevier Inc.  

## 2020-07-08 NOTE — Assessment & Plan Note (Signed)
Weight keeps going in the right direction. She will pick up [facility] soon.

## 2020-07-08 NOTE — Assessment & Plan Note (Signed)
Weight keeps going in the right direction. She will pick up Wegovy soon. 

## 2020-07-13 ENCOUNTER — Ambulatory Visit: Payer: No Typology Code available for payment source | Admitting: Internal Medicine

## 2020-07-14 ENCOUNTER — Other Ambulatory Visit (HOSPITAL_COMMUNITY): Payer: Self-pay

## 2020-07-14 MED FILL — Semaglutide (Weight Mngmt) Soln Auto-Injector 2.4 MG/0.75ML: SUBCUTANEOUS | 28 days supply | Qty: 3 | Fill #0 | Status: AC

## 2020-07-29 ENCOUNTER — Telehealth: Payer: Self-pay | Admitting: Family Medicine

## 2020-07-29 NOTE — Telephone Encounter (Signed)
**  After Hours/ Emergency Line Call**  Received a call to report that [name]  flu like symptoms, headaches, runny nose, low grade fever 99.6. Had COVID testing this morning.  Recommended to continue COVID precautions until results return.  Symptom management with Tylenol and hydration.  Red flags discussed.  If symptoms worsen recommend to be evaluated in Urgent Care or ED. Will forward to PCP.  Carollee Leitz MD PGY-2, [facility] 07/29/2020 2:55 PM

## 2020-08-01 ENCOUNTER — Ambulatory Visit (INDEPENDENT_AMBULATORY_CARE_PROVIDER_SITE_OTHER): Payer: No Typology Code available for payment source | Admitting: Family Medicine

## 2020-08-01 ENCOUNTER — Other Ambulatory Visit: Payer: Self-pay

## 2020-08-01 ENCOUNTER — Other Ambulatory Visit (HOSPITAL_COMMUNITY): Payer: Self-pay

## 2020-08-01 DIAGNOSIS — R0981 Nasal congestion: Secondary | ICD-10-CM

## 2020-08-01 MED ORDER — MONTELUKAST SODIUM 10 MG PO TABS
10.0000 mg | ORAL_TABLET | Freq: Every day | ORAL | 3 refills | Status: DC
  Filled 2020-08-01: qty 30, 30d supply, fill #0

## 2020-08-01 MED ORDER — FLONASE SENSIMIST 27.5 MCG/SPRAY NA SUSP
2.0000 | Freq: Every day | NASAL | 12 refills | Status: DC
  Filled 2020-08-01 (×2): qty 9.1, 30d supply, fill #0

## 2020-08-01 NOTE — Progress Notes (Signed)
    SUBJECTIVE:   CHIEF COMPLAINT / HPI:   Ms. Racz is a 60 yo F who presents for the following:  Headache and congestion  Experiencing congestion, rhinorrhea, and body aches that started this weekend. She is now having a headache. States she had allergies and has been taking claritin but without relief. She does have some mucus production that is clear. She is concerned that this could be the flu and not her allergies. No other know sick contacts. Denies SOB, fever, vomiting, diarrhea.   PERTINENT  PMH / PSH: Seasonal allergies  OBJECTIVE:   LMP  (LMP Unknown)   General: Appears to feel unwell, no acute distress. Age appropriate. HEENT: Normocephalic. White sclera. Patent nares. Frontal and maxillary sinuses nontender.  Cardiac: RRR, normal heart sounds, no murmurs Respiratory: CTAB, normal effort  ASSESSMENT/PLAN:   Nasal congestion Upper respiratory symptoms with body aches and headache. Question viral illness (if flu outside of window for tamiflu) v. Seasonal allergies v. Sinusitis (no sinus pressure and clear mucus drainage). Discussed continued supportive care. Will change from Claritin to Singulair and add flonase.  -Discontinue claritin, start singulair -Start flonase -Supportive care measure mostly staying hydrated and tylenol for headache -F/u if symptoms fail to resolve.   Gerlene Fee, [place]

## 2020-08-01 NOTE — Patient Instructions (Addendum)
I have sent in singulair and flonase to help with congestion.   Please call the clinic at [phone] if your symptoms worsen or you have any concerns. It was our pleasure to serve you.  Dr. Janus Molder

## 2020-08-02 ENCOUNTER — Other Ambulatory Visit (HOSPITAL_COMMUNITY): Payer: Self-pay

## 2020-08-02 MED FILL — Estradiol TD Patch Twice Weekly 0.075 MG/24HR: TRANSDERMAL | 28 days supply | Qty: 8 | Fill #0 | Status: AC

## 2020-08-03 ENCOUNTER — Other Ambulatory Visit (HOSPITAL_COMMUNITY): Payer: Self-pay

## 2020-08-04 ENCOUNTER — Other Ambulatory Visit (HOSPITAL_COMMUNITY): Payer: Self-pay

## 2020-08-04 ENCOUNTER — Ambulatory Visit: Payer: No Typology Code available for payment source | Admitting: Internal Medicine

## 2020-08-04 ENCOUNTER — Other Ambulatory Visit (INDEPENDENT_AMBULATORY_CARE_PROVIDER_SITE_OTHER): Payer: No Typology Code available for payment source

## 2020-08-04 ENCOUNTER — Other Ambulatory Visit: Payer: Self-pay

## 2020-08-04 ENCOUNTER — Encounter: Payer: Self-pay | Admitting: Internal Medicine

## 2020-08-04 VITALS — BP 128/88 | HR 81 | Ht 64.0 in | Wt 175.8 lb

## 2020-08-04 DIAGNOSIS — R7989 Other specified abnormal findings of blood chemistry: Secondary | ICD-10-CM | POA: Diagnosis not present

## 2020-08-04 DIAGNOSIS — E785 Hyperlipidemia, unspecified: Secondary | ICD-10-CM

## 2020-08-04 DIAGNOSIS — E559 Vitamin D deficiency, unspecified: Secondary | ICD-10-CM

## 2020-08-04 DIAGNOSIS — R0981 Nasal congestion: Secondary | ICD-10-CM | POA: Insufficient documentation

## 2020-08-04 DIAGNOSIS — E538 Deficiency of other specified B group vitamins: Secondary | ICD-10-CM

## 2020-08-04 DIAGNOSIS — E1165 Type 2 diabetes mellitus with hyperglycemia: Secondary | ICD-10-CM | POA: Diagnosis not present

## 2020-08-04 LAB — LIPID PANEL
Cholesterol: 259 mg/dL — ABNORMAL HIGH (ref 0–200)
HDL: 43.3 mg/dL (ref >39.00)
LDL Cholesterol: 189 mg/dL — ABNORMAL HIGH (ref 0–99)
NonHDL: 215.7
Total CHOL/HDL Ratio: 6
Triglycerides: 134 mg/dL (ref 0.0–149.0)
VLDL: 26.8 mg/dL (ref 0.0–40.0)

## 2020-08-04 LAB — MICROALBUMIN / CREATININE URINE RATIO
Creatinine,U: 193.6 mg/dL
Microalb Creat Ratio: 0.6 mg/g (ref 0.0–30.0)
Microalb, Ur: 1.2 mg/dL (ref 0.0–1.9)

## 2020-08-04 LAB — VITAMIN B12: Vitamin B-12: 386 pg/mL (ref 211–911)

## 2020-08-04 MED ORDER — ROSUVASTATIN CALCIUM 20 MG PO TABS
ORAL_TABLET | Freq: Every day | ORAL | 3 refills | Status: DC
  Filled 2020-08-04: qty 90, 90d supply, fill #0

## 2020-08-04 MED ORDER — EZETIMIBE 10 MG PO TABS
10.0000 mg | ORAL_TABLET | Freq: Every day | ORAL | 3 refills | Status: DC
  Filled 2020-08-04: qty 90, 90d supply, fill #0
  Filled 2021-02-01: qty 90, 90d supply, fill #1
  Filled 2021-06-06: qty 90, 90d supply, fill #0

## 2020-08-04 NOTE — Assessment & Plan Note (Addendum)
Upper respiratory symptoms with body aches and headache. Question viral illness (if flu outside of window for tamiflu) v. Seasonal allergies v. Sinusitis (no sinus pressure and clear mucus drainage). Discussed continued supportive care. Will change from Claritin to Singulair and add flonase.  -Discontinue claritin, start singulair -Start flonase -Supportive care measure mostly staying hydrated and tylenol/ibuprofen for headache -F/u if symptoms fail to resolve.

## 2020-08-04 NOTE — Progress Notes (Signed)
Patient ID: [name], female   DOB: [birth year], 60 y.o.   MRN: [id number]  This visit occurred during the SARS-CoV-2 public health emergency.  Safety protocols were in place, including screening questions prior to the visit, additional usage of staff PPE, and extensive cleaning of exam room while observing appropriate contact time as indicated for disinfecting solutions.   HPI: [name] is a 60 y.o.-year-old female, presenting for f/u for DM2; prediabetes dx early 2000s, controlled, without long term complications, hyperlipidemia, subclinical thyrotoxicosis. Also, vitamin D deficiency and low vitamin B12.last visit 6 months ago.  Interim history: Since last visit, she was in the ED with HTN 06/27/2020.  She was started on Norvasc. This visit, she does not report any increased urination, blurry vision, nausea.   She lost ~35 lbs since she started [facility]!  After the weight loss, she has fatigue and feels more cold. Also more congestion (allergies).  Reviewed HbA1c levels: Lab Results  Component Value Date   HGBA1C 5.8 07/08/2020   HGBA1C 5.9 12/18/2019   HGBA1C 6.1 09/15/2019  She had several steroid inj in the past- last in 04/2014.  Previously on: - Metformin ER 500 mg 2x a day >> we had to decrease to once a day 2/2 GI discomfort >> 500 mg with dinner. She previously tried: - Tradjenta 5 mg in am, before b'fast  - Invokana 100 mg in am - started 03/2015 >> she had CP, HA, lightheadedness - regular metformin >> diarrhea/nausea with regular metformin.  Currently on: - Wegovy 2.4 mg weekly  Pt checks sugars twice a day: - am:  87-90s, 101 >> 88-102 >> 80s >> 80-90s - 2h after b'fast:  n/c >> 90-125 >> 86-127 >> n/c - lunch: 95-100 >> 90s >> n/c >> 87-120 >> n/c - 2h after lunch:  95-100 >> n/c >> 86-118 >> n/c - before dinner:80-100 >> n/c >> 80-127 >> n/c - 2h after dinner: 110-118 >> 112-115 >> 90s >> 98 - bedtime:  85-100 >> n/c >> 97-121 >> n/c -  nighttime: 89-116 Lowest sugar was  66.... 80 >> 88; it is unclear at which level she has hypoglycemia awareness.. Highest: 135 .Marland Kitchen. 130 >> 98 >> 98.  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  -No CKD: Lab Results  Component Value Date   BUN 11 04/26/2020   CREATININE 1.08 (H) 04/26/2020   Lab Results  Component Value Date   MICRALBCREAT 43 (H) 09/15/2019   MICRALBCREAT 30-300 10/07/2018   MICRALBCREAT 0.8 07/04/2017   MICRALBCREAT <5.6 12/11/2016   - last eye exam was in 06/2020: No DR, + cataract. [facility]. Saw Dr Claudean Kinds: + history of a corneal ulcer -+ Numbness, tingling, burning in her feet- related to cervical spondylosis -on Neurontin  HL: She has hypercholesterolemia: Lab Results  Component Value Date   CHOL 186 01/08/2020   CHOL 260 (H) 07/03/2019   CHOL 224 (H) 01/02/2019   Lab Results  Component Value Date   HDL 40.20 01/08/2020   HDL 51.60 07/03/2019   HDL 47.20 01/02/2019   Lab Results  Component Value Date   LDLCALC 123 (H) 01/08/2020   LDLCALC 190 (H) 07/03/2019   LDLCALC 158 (H) 01/02/2019   Lab Results  Component Value Date  TRIG 112.0 01/08/2020   TRIG 91.0 07/03/2019   TRIG 96.0 01/02/2019   Lab Results  Component Value Date   CHOLHDL 5 01/08/2020   CHOLHDL 5 07/03/2019   CHOLHDL 5 01/02/2019   On Crestor 20 mg daily and Zetia 10.  She had normal LFTs: Lab Results  Component Value Date   ALT 12 04/26/2020   AST 17 04/26/2020   ALKPHOS 103 04/26/2020   BILITOT <0.2 04/26/2020   Low vitamin B12 and D:  Reviewed her vitamin levels: Lab Results  Component Value Date   VD25OH 26.2 (L) 12/18/2019   VD25OH 22.72 (L) 07/03/2019   VD25OH 32.8 11/18/2018   VD25OH 16.8 (L) 02/07/2018   VD25OH  32.81 12/30/2017   VD25OH 23.26 (L) 07/04/2017   VD25OH 17.98 (L) 08/03/2016   VD25OH 9 (L) 05/13/2015   Lab Results  Component Value Date   VITAMINB12 397 12/18/2019   VITAMINB12 347 07/03/2019   VITAMINB12 584 11/18/2018   VITAMINB12 613 02/07/2018   VITAMINB12 390 12/30/2017   VITAMINB12 368 07/04/2017   VITAMINB12 315 08/03/2016   VITAMINB12 428 05/13/2015   VITAMINB12 496 01/22/2014    She is on: -B12 injections, previously p.o. B12 5000 units daily -now back to injections, but not compliant -Ergocalciferol 50,000 weekly  Subclinical thyrotoxicosis   Reviewed her TFTs: Lab Results  Component Value Date   TSH 0.572 06/07/2020   TSH 0.565 12/18/2019   TSH 0.938 11/18/2018   TSH 0.42 07/04/2017   TSH 0.97 12/04/2016   TSH 0.34 (L) 08/03/2016   TSH 0.430 (L) 06/08/2016   TSH 0.77 05/13/2015   TSH 0.619 01/22/2014   TSH 0.730 10/14/2013   Lab Results  Component Value Date   FREET4 0.70 07/04/2017   FREET4 0.77 12/04/2016   FREET4 0.79 08/03/2016   T3FREE 3.8 07/04/2017   T3FREE 3.4 12/04/2016   T3FREE 3.6 08/03/2016   She has a history of dysphagia and has had esophageal stretching in the past.  No new symptoms.  She has a history of migraines (related to neck pain, C5-C6 - will get a steroid inj soon), carpal tunnel, and peripheral neuropathy. She also has rotator cuff pbs.  She had cervical decompression surgery in 04/2019.   ROS: Constitutional: + See HPI Eyes: no blurry vision, no xerophthalmia ENT: no sore throat, no nodules palpated in neck, no dysphagia, no odynophagia, no hoarseness Cardiovascular: no CP/no SOB/no palpitations/no leg swelling Respiratory: no cough/no SOB/no wheezing Gastrointestinal: no N/no V/no D/no C/no acid reflux Musculoskeletal: no muscle aches/no joint aches Skin: no rashes, no hair loss Neurological: no tremors/no numbness/no tingling/no dizziness  I reviewed pt's medications, allergies, PMH, social hx, family hx, and  changes were documented in the history of present illness. Otherwise, unchanged from my initial visit note.  Past Medical History:  Diagnosis Date  . Anxiety   . 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 Brain tumor (benign) ([place]) 2018  . Chest pain 05/15/2015  . Complication of anesthesia   . Depression    in the past  . Diabetes mellitus without complication ([place])    Type II  . GERD (gastroesophageal reflux disease)    04/30/2019- "in the past"  . H/O varicella   . History of measles, mumps, or rubella   . HSV-2 (herpes simplex virus 2) infection   . Hx of ovarian cyst   . Hyperlipidemia   . Hypersomnia 07/19/2015  . Hypertension    "sometimes"   .  Migraine headache    "due to cervical issuses"  . Plantar fasciitis   . PONV (postoperative nausea and vomiting)   . Scapular dyskinesis 10/03/2016  . Sleep apnea    pt denies  . Sleep deprivation    loss of sleep  . 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 ABDOMINAL HYSTERECTOMY    . ANTERIOR CERVICAL DECOMP/DISCECTOMY FUSION N/A 05/07/2019   Procedure: ANTERIOR CERVICAL DECOMPRESSION FUSION CERVICAL 5-6, CERVICAL 6-7 WITH INSTRUMENTATION AND ALLOGRAFT;  Surgeon: Phylliss Bob, MD;  Location: [place];  Service: Orthopedics;  Laterality: N/A;  . CHOLECYSTECTOMY  11/13/10  . COLONOSCOPY  Multiple  . DILATION AND CURETTAGE OF UTERUS    . ESOPHAGOGASTRODUODENOSCOPY    . OOPHORECTOMY  2009   bilateral salpingo-oophorectomy.  Marland Kitchen SHOULDER ARTHROSCOPY WITH ROTATOR CUFF REPAIR Right 08/03/2019   Procedure: SHOULDER ARTHROSCOPY WITH. ROTATOR CUFF DEBRIDEMENT, SUBACROMIAL DECOMPRESSION, DISTAL CLAVICLE EXCISION;  Surgeon: Tania Ade, MD;  Location: [place];  Service: Orthopedics;  Laterality: Right;  . TUBAL LIGATION     Social History   Socioeconomic History  . Marital status: Married    Spouse  name: [name]  . Number of children: 2  . Years of education: 48  . Highest education level: Associate degree: academic program  Occupational History    Employer: [employer]  Tobacco Use  . Smoking status: Former Smoker    Packs/day: 0.50    Years: 30.00    Pack years: 15.00    Types: Cigarettes    Quit date: 2012    Years since quitting: 10.3  . Smokeless tobacco: Never Used  Vaping Use  . Vaping Use: Never used  Substance and Sexual Activity  . Alcohol use: No    Alcohol/week: 0.0 standard drinks  . Drug use: No  . Sexual activity: Yes    Partners: Male    Birth control/protection: Surgical    Comment: Hysterectomy  Other Topics Concern  . Not on file  Social History Narrative   Patient lives at home with her husband [name]). 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 Two children. Both boys      Patient is right-handed. She lives in a one level home. She drinks coffee occasionally. She does not regularly exercise.   Social Determinants of Health   Financial Resource Strain: Not on file  Food Insecurity: Not on file  Transportation Needs: Not on file  Physical Activity: Not on file  Stress: Not on file  Social Connections: Not on file  Intimate Partner Violence: Not on file   Current Outpatient Medications on File Prior to Visit  Medication Sig Dispense Refill  . amLODipine (NORVASC) 5 MG tablet Take 1 tablet (5 mg total) by mouth daily. 30 tablet 1  . azithromycin (ZITHROMAX) 250 MG tablet Take 2 tablets by mouth day 1, then 1 tablet daily 6 each 0  . blood glucose meter kit and supplies Dispense based on patient and insurance preference. Use once a day as directed. (FOR ICD-10 E10.9, E11.9). 1 each 0  . Elastic Bandages & Supports (WRIST BRACE/LEFT MEDIUM) MISC 1 each by Does not apply route as needed. 1 each 0  . estradiol (VIVELLE-DOT) 0.075 MG/24HR PLACE 1 PATCH ONTO THE SKIN 2 (TWO)  TIMES A WEEK. 24 patch 3  . ezetimibe (ZETIA)  10 MG tablet TAKE 1 TABLET (10 MG TOTAL) BY MOUTH DAILY. 90 tablet 2  . fluticasone (FLONASE SENSIMIST) 27.5 MCG/SPRAY nasal spray Place 2 sprays into the nose daily. 9.1 mL 12  . ibuprofen (ADVIL) 600 MG tablet TAKE 1 TABLET (600 MG TOTAL) BY MOUTH EVERY 8 (EIGHT) HOURS AS NEEDED. (Patient not taking: No sig reported) 90 tablet 2  . montelukast (SINGULAIR) 10 MG tablet Take 1 tablet (10 mg total) by mouth at bedtime. 30 tablet 3  . rosuvastatin (CRESTOR) 20 MG tablet TAKE 1 TABLET (20 MG TOTAL) BY MOUTH AT BEDTIME. 90 tablet 0  . Semaglutide-Weight Management 2.4 MG/0.75ML SOAJ INJECT 2.4 MG (0.75 ML) INTO THE SKIN EVERY 7 (SEVEN) DAYS. (Patient not taking: Reported on 07/08/2020) 3 mL 3   No current facility-administered medications on file prior to visit.   Allergies  Allergen Reactions  . Sulfa Antibiotics Anaphylaxis, Hives and Swelling   Family History  Problem Relation Age of Onset  . Hypertension Mother   . Hypotension Mother   . Anemia Mother        low iron  . Heart disease Sister   . Sickle cell trait Other   . Alcohol abuse Brother   . Alcohol abuse Brother   . Aneurysm Brother   . Drug abuse Brother   . Colon cancer Neg Hx   . Esophageal cancer Neg Hx   . Stomach cancer Neg Hx   . Breast cancer Neg Hx     PE: BP 128/88 (BP Location: Right Arm, Patient Position: Sitting, Cuff Size: Normal)   Pulse 81   Ht 5' 4"  (1.626 m)   Wt 175 lb 12.8 oz (79.7 kg)   LMP  (LMP Unknown)   SpO2 98%   BMI 30.18 kg/m  Body mass index is 30.18 kg/m. Wt Readings from Last 3 Encounters:  08/04/20 175 lb 12.8 oz (79.7 kg)  07/08/20 180 lb 4 oz (81.8 kg)  06/07/20 183 lb (83 kg)   Constitutional: overweight, in NAD Eyes: PERRLA, EOMI, no exophthalmos ENT: moist mucous membranes, no thyromegaly, no cervical lymphadenopathy Cardiovascular: RRR, No MRG Respiratory: CTA B Gastrointestinal: abdomen soft, NT, ND, BS+ Musculoskeletal: no  deformities, strength intact in all 4 Skin: moist, warm, no rashes Neurological: no tremor with outstretched hands, DTR normal in all 4  ASSESSMENT: 1. DM2, non-insulin dependent, without long-term complications, but with hyperglycemia  2. HL  3. H/o Low TSH  4.  Low vitamin D   5. Low B12  PLAN:  1. Patient with history of controlled type 2 diabetes, on Wegovy now, with good control.  At last visit, we stopped metformin.  HbA1c and the most recent check on 07/08/2020 was excellent, at 5.8%, improved.  She tolerates Wegovy well, without nausea or other GI symptoms. -At today's visit, sugars are all at goal so no change in regimen is needed for now. - I suggested to: Patient Instructions  Please continue: - Wegovy 2.4 mg weekly  Also, continue: - Ergocalciferol 50,000 units weekly - B12 injections monthly - Crestor 30 mg daily - Zetia 10 mg daily  Please return in 6 months with your sugar log.   - advised to check sugars at different times of the day - 1x a day, rotating check times - advised for yearly eye exams >> she is UTD - return to clinic in 1 year  2. HL -Regulated lipid panel from last visit: LDL still high, but improved: Lab Results  Component Value Date  CHOL 186 01/08/2020   HDL 40.20 01/08/2020   LDLCALC 123 (H) 01/08/2020   LDLDIRECT 148 (H) 10/23/2019   TRIG 112.0 01/08/2020   CHOLHDL 5 01/08/2020  -On Crestor 20 mg daily and Zetia 10 mg daily without side effects -We will check a lipid panel today  3. Low TSH -No thyrotoxic symptoms -Latest TSH was normal 2 months ago: Lab Results  Component Value Date   TSH 0.572 06/07/2020   4. Low vitamin D  - not completely compliant with her ergocalciferol 50,000 units weekly. -Latest vitamin D level was still low, at 26.2 in 12/2019 -She continues on ergocalciferol, but inconsistently -We will repeat a level today  5. Low B12 -She was not compliant with B12 injections >> now off -At that time, B12  was in the low normal range -We tried p.o. B12 in the past but she was forgetting doses -Latest B12 level was improved to 397 in 12/2019 -We will repeat a level today  Component     Latest Ref Rng & Units 08/04/2020  Cholesterol     0 - 200 mg/dL 259 (H)  Triglycerides     0.0 - 149.0 mg/dL 134.0  HDL Cholesterol     >39.00 mg/dL 43.30  VLDL     0.0 - 40.0 mg/dL 26.8  LDL (calc)     0 - 99 mg/dL 189 (H)  Total CHOL/HDL Ratio      6  NonHDL      215.70  Microalb, Ur     0.0 - 1.9 mg/dL 1.2  Creatinine,U     mg/dL 193.6  MICROALB/CREAT RATIO     0.0 - 30.0 mg/g 0.6  Vitamin B12     211 - 911 pg/mL 386  Vitamin D, 25-Hydroxy     30.0 - 100.0 ng/mL 26.0 (L)  Vitamin D is low, so I will again advised her to take ergocalciferol consistently. B12 level is ok. ACR is normal. LDL is very high.  I suspect she may be missing Crestor doses.  I will advise her to take it consistently.  Philemon Kingdom, MD PhD [facility] Endocrinology

## 2020-08-04 NOTE — Patient Instructions (Addendum)
Please continue: - Wegovy 2.4 mg weekly  Also, continue: - Ergocalciferol 50,000 units weekly - B12 injections monthly - Crestor 20 mg daily - Zetia 10 mg daily  Please return in 1 year.

## 2020-08-05 LAB — VITAMIN D 25 HYDROXY (VIT D DEFICIENCY, FRACTURES): Vit D, 25-Hydroxy: 26 ng/mL — ABNORMAL LOW (ref 30.0–100.0)

## 2020-08-25 ENCOUNTER — Other Ambulatory Visit (HOSPITAL_COMMUNITY): Payer: Self-pay

## 2020-08-31 ENCOUNTER — Encounter: Payer: Self-pay | Admitting: Family Medicine

## 2020-08-31 ENCOUNTER — Other Ambulatory Visit (HOSPITAL_COMMUNITY): Payer: Self-pay

## 2020-08-31 DIAGNOSIS — R0981 Nasal congestion: Secondary | ICD-10-CM

## 2020-08-31 MED ORDER — MONTELUKAST SODIUM 10 MG PO TABS
10.0000 mg | ORAL_TABLET | Freq: Every day | ORAL | 1 refills | Status: DC
  Filled 2020-08-31: qty 90, 90d supply, fill #0
  Filled 2020-12-15 (×2): qty 30, 30d supply, fill #1
  Filled 2021-01-24: qty 30, 30d supply, fill #2
  Filled 2021-03-10: qty 30, 30d supply, fill #0

## 2020-08-31 MED ORDER — AMLODIPINE BESYLATE 5 MG PO TABS
5.0000 mg | ORAL_TABLET | Freq: Every day | ORAL | 1 refills | Status: DC
  Filled 2020-08-31: qty 90, 90d supply, fill #0
  Filled 2020-12-15: qty 30, 30d supply, fill #1

## 2020-09-08 ENCOUNTER — Other Ambulatory Visit (HOSPITAL_COMMUNITY): Payer: Self-pay

## 2020-09-08 MED FILL — Semaglutide (Weight Mngmt) Soln Auto-Injector 2.4 MG/0.75ML: SUBCUTANEOUS | 28 days supply | Qty: 3 | Fill #1 | Status: AC

## 2020-09-20 ENCOUNTER — Encounter: Payer: Self-pay | Admitting: Family Medicine

## 2020-09-20 ENCOUNTER — Other Ambulatory Visit: Payer: Self-pay

## 2020-09-20 ENCOUNTER — Ambulatory Visit (INDEPENDENT_AMBULATORY_CARE_PROVIDER_SITE_OTHER): Payer: No Typology Code available for payment source | Admitting: Family Medicine

## 2020-09-20 ENCOUNTER — Other Ambulatory Visit (HOSPITAL_COMMUNITY): Payer: Self-pay

## 2020-09-20 VITALS — BP 119/80 | HR 72 | Ht 64.0 in | Wt 173.1 lb

## 2020-09-20 DIAGNOSIS — Z87891 Personal history of nicotine dependence: Secondary | ICD-10-CM

## 2020-09-20 DIAGNOSIS — E785 Hyperlipidemia, unspecified: Secondary | ICD-10-CM

## 2020-09-20 DIAGNOSIS — I10 Essential (primary) hypertension: Secondary | ICD-10-CM | POA: Diagnosis not present

## 2020-09-20 DIAGNOSIS — E1169 Type 2 diabetes mellitus with other specified complication: Secondary | ICD-10-CM | POA: Diagnosis not present

## 2020-09-20 DIAGNOSIS — M25512 Pain in left shoulder: Secondary | ICD-10-CM | POA: Diagnosis not present

## 2020-09-20 DIAGNOSIS — G8929 Other chronic pain: Secondary | ICD-10-CM

## 2020-09-20 MED ORDER — SEMAGLUTIDE-WEIGHT MANAGEMENT 2.4 MG/0.75ML ~~LOC~~ SOAJ
SUBCUTANEOUS | 3 refills | Status: DC
  Filled 2020-09-20 – 2021-03-08 (×4): qty 3, 28d supply, fill #0
  Filled 2021-04-06: qty 3, 28d supply, fill #1
  Filled 2021-05-05: qty 3, 28d supply, fill #2
  Filled 2021-06-06: qty 3, 28d supply, fill #3

## 2020-09-20 MED ORDER — ROSUVASTATIN CALCIUM 40 MG PO TABS
40.0000 mg | ORAL_TABLET | Freq: Every day | ORAL | 1 refills | Status: DC
  Filled 2020-09-20 – 2021-06-06 (×2): qty 90, 90d supply, fill #0

## 2020-09-20 NOTE — Progress Notes (Signed)
    SUBJECTIVE:   CHIEF COMPLAINT / HPI:   Shoulder Pain  The pain is present in the left shoulder. This is a chronic problem. Episode onset: Started 2 months ago. The problem has been gradually worsening. The quality of the pain is described as aching and sharp. The pain is at a severity of 7/10. The pain is moderate. Associated symptoms include joint locking and a limited range of motion. Pertinent negatives include no joint swelling or numbness. She has tried acetaminophen and cold (She puts ice on it which helps sometimes) for the symptoms. The treatment provided moderate relief. Her past medical history is significant for osteoarthritis.    HTN/HLD: She is compliant with her Norvasc 5 mg QD. Here for f/u. She is compliant with Crestor 20 mg QD.  DM2/Weight management: Need refill of her Wegovy  Hx of tobacco use: Half pack per week from 19 - 40 in 1 week  PERTINENT  PMH / PSH: PMx reviewed  OBJECTIVE:   BP 119/80   Pulse 72   Ht 5\' 4"  (1.626 m)   Wt 173 lb 2 oz (78.5 kg)   LMP  (LMP Unknown)   SpO2 99%   BMI 29.72 kg/m   Physical Exam Constitutional:      Appearance: Normal appearance.  Cardiovascular:     Rate and Rhythm: Normal rate and regular rhythm.     Heart sounds: Normal heart sounds.  Pulmonary:     Effort: Pulmonary effort is normal. No respiratory distress.     Breath sounds: Normal breath sounds. No wheezing.  Musculoskeletal:     Right lower leg: No edema.     Left lower leg: No edema.  Neurological:     Mental Status: She is alert.     ASSESSMENT/PLAN:   Left shoulder pain Likely DJD. Xray ordered. Home exercise instruction provided. Tylenol as needed for pain. Discussed PT referral which she is not interested in for now. F/U as needed.  Essential hypertension BP looks good on her current regimen. Continue same.   DM2 (diabetes mellitus, type 2) ([place]) Endocrinologist's note reviewed. Continue Wegovy and diet control. Med refilled  today.  Hyperlipidemia Compliant with Crestor 20 mg QD and Zetia. LDL above goal. Increased Crestor to 40 mg QD. Of note, upon record review, it seems at some point, her endocrinologist sent in refill of Crestor 10 mg instead of 20mg . I tried to reach her to clarify but no response. I will follow-up on this for proper med adjustment.  NB: Patient reached out. She has been taking Crestor 20 mg QD. I gave two options to avoid confusion: 1. Reach out to endo to see if they are ok with increased Crestor to 40 mg vs. 2. Continue 20 mg for now with lifestyle modification and repeat LDL in 6 months.  She will reach back to me regarding these to options.  Morbid obesity ([place]) Weight continue to trend down on Wegovy. Med refilled.  History of tobacco use Based on hx, her PYL = 4-8 years. Does not qualify for yearly Lung cancer screen. F/U as needed.    Declined Shingrix and Harvel, MD [place]

## 2020-09-20 NOTE — Assessment & Plan Note (Addendum)
Compliant with Crestor 20 mg QD and Zetia. LDL above goal. Increased Crestor to 40 mg QD. Of note, upon record review, it seems at some point, her endocrinologist sent in refill of Crestor 10 mg instead of 20mg . I tried to reach her to clarify but no response. I will follow-up on this for proper med adjustment.  NB: Patient reached out. She has been taking Crestor 20 mg QD. I gave two options to avoid confusion: 1. Reach out to endo to see if they are ok with increased Crestor to 40 mg vs. 2. Continue 20 mg for now with lifestyle modification and repeat LDL in 6 months.  She will reach back to me regarding these to options.

## 2020-09-20 NOTE — Assessment & Plan Note (Signed)
BP looks good on her current regimen. Continue same.

## 2020-09-20 NOTE — Assessment & Plan Note (Signed)
Weight continue to trend down on Wegovy. Med refilled.

## 2020-09-20 NOTE — Assessment & Plan Note (Signed)
Based on hx, her PYL = 4-8 years. Does not qualify for yearly Lung cancer screen. F/U as needed.

## 2020-09-20 NOTE — Assessment & Plan Note (Signed)
Endocrinologist's note reviewed. Continue Wegovy and diet control. Med refilled today.

## 2020-09-20 NOTE — Patient Instructions (Signed)
It was nice seeing you today. Your cholesterol is elevated. We will increase your crestor to 40 mg. I have place xray order for your shoulder. I will contact you with the result.

## 2020-09-20 NOTE — Assessment & Plan Note (Signed)
Likely DJD. Xray ordered. Home exercise instruction provided. Tylenol as needed for pain. Discussed PT referral which she is not interested in for now. F/U as needed.

## 2020-09-23 ENCOUNTER — Other Ambulatory Visit: Payer: Self-pay

## 2020-09-23 ENCOUNTER — Other Ambulatory Visit (HOSPITAL_COMMUNITY): Payer: Self-pay

## 2020-09-23 ENCOUNTER — Ambulatory Visit (HOSPITAL_COMMUNITY)
Admission: RE | Admit: 2020-09-23 | Discharge: 2020-09-23 | Disposition: A | Discharge status: Home / Self Care | Payer: No Typology Code available for payment source | Source: Ambulatory Visit | Attending: Family Medicine | Admitting: Family Medicine

## 2020-09-23 DIAGNOSIS — G8929 Other chronic pain: Secondary | ICD-10-CM | POA: Insufficient documentation

## 2020-09-23 DIAGNOSIS — M25512 Pain in left shoulder: Secondary | ICD-10-CM | POA: Diagnosis not present

## 2020-09-27 ENCOUNTER — Telehealth: Payer: Self-pay | Admitting: Family Medicine

## 2020-09-27 ENCOUNTER — Other Ambulatory Visit (HOSPITAL_COMMUNITY): Payer: Self-pay

## 2020-09-27 DIAGNOSIS — G8929 Other chronic pain: Secondary | ICD-10-CM

## 2020-09-27 NOTE — Telephone Encounter (Signed)
Shoulder xray discussed.  Pain persist. She declined MRI shoulder for now. I recommended Sports med referral, where she can get an [place] as well as shoulder injection.  She mentioned there is a knot her her shoulder she forgot to mention the last time. She can come in to see me soon regarding that.  Continue Ibuprofen as needed.  Sports Med referral placed. She agreed with the plan.

## 2020-10-05 ENCOUNTER — Ambulatory Visit: Payer: No Typology Code available for payment source | Admitting: Family Medicine

## 2020-10-06 ENCOUNTER — Other Ambulatory Visit: Payer: Self-pay

## 2020-10-06 ENCOUNTER — Ambulatory Visit: Payer: No Typology Code available for payment source | Admitting: Sports Medicine

## 2020-10-06 VITALS — Ht 65.0 in | Wt 170.0 lb

## 2020-10-06 DIAGNOSIS — M25512 Pain in left shoulder: Secondary | ICD-10-CM | POA: Diagnosis not present

## 2020-10-06 NOTE — Progress Notes (Signed)
   Subjective:    Patient ID: [name], female    DOB: [birth year], 60 y.o.   MRN: [id number]  HPI chief complaint: Left shoulder pain  [name] is a very pleasant 60 year old right-hand-dominant female that comes in today complaining of 3 months of diffuse left shoulder pain.  She has a history of right shoulder rotator cuff debridement performed by Dr. Tamera Punt in May 2020.  She has done very well postoperatively.  Her current symptoms are similar in nature to what she experienced at that time.  Prior to surgery she had tried physical therapy and cortisone injections as well as oral medications without significant improvement.  She recently saw her PCP for her left shoulder pain and she ordered an x-ray which was unremarkable.  It was felt that an ultrasound evaluation may be helpful.  Past medical history reviewed Past surgical history reviewed.  In addition to the aforementioned right shoulder rotator cuff debridement, she also underwent cervical spine surgery with Dr. Lynann Bologna in February 2020. Allergies reviewed Medications reviewed    Review of Systems As above    Objective:   Physical Exam  Well-developed, well-nourished.  No acute distress.  Left shoulder: Patient has limited range of motion in all planes.  Active forward flexion and abduction to about 80 degrees.  Extremely limited internal rotation actively.  Active external rotation is to about 60 degrees but passive external rotation is comparable to the uninvolved right shoulder.  Markedly positive empty can and [place].  Pain but no weakness with resisted supraspinatus.  Neurovascularly intact distally.  X-rays of the left shoulder are as above      Assessment & Plan:   Left shoulder pain likely secondary to rotator cuff tendinopathy versus rotator cuff tear  Patient will be scheduled for a complete left shoulder ultrasound to be done sometime next week.  I did offer her a subacromial cortisone injection for  pain relief but she refused.  If her level of pain prevents [place] from being able to perform an adequate ultrasound then we may need to consider an MRI.  We will delineate treatment based on her ultrasound findings next week.

## 2020-10-14 ENCOUNTER — Telehealth: Payer: Self-pay

## 2020-10-14 ENCOUNTER — Other Ambulatory Visit (HOSPITAL_COMMUNITY): Payer: Self-pay

## 2020-10-14 ENCOUNTER — Ambulatory Visit: Payer: No Typology Code available for payment source | Admitting: Sports Medicine

## 2020-10-14 ENCOUNTER — Telehealth (INDEPENDENT_AMBULATORY_CARE_PROVIDER_SITE_OTHER): Payer: No Typology Code available for payment source | Admitting: Family Medicine

## 2020-10-14 ENCOUNTER — Other Ambulatory Visit: Payer: Self-pay

## 2020-10-14 DIAGNOSIS — U071 COVID-19: Secondary | ICD-10-CM

## 2020-10-14 MED ORDER — NIRMATRELVIR/RITONAVIR (PAXLOVID)TABLET
3.0000 | ORAL_TABLET | Freq: Two times a day (BID) | ORAL | 0 refills | Status: AC
  Filled 2020-10-14: qty 30, 5d supply, fill #0

## 2020-10-14 MED ORDER — NIRMATRELVIR/RITONAVIR (PAXLOVID) TABLET (RENAL DOSING)
2.0000 | ORAL_TABLET | Freq: Two times a day (BID) | ORAL | 0 refills | Status: DC
  Filled 2020-10-14: qty 20, 5d supply, fill #0

## 2020-10-14 NOTE — Telephone Encounter (Signed)
Received phone call from pharmacy regarding Paxlovid dosing. Pharmacy states that patient qualifies for the full dosing due to GFR of 65.   Please advise if you would like to change to full dose. If so, please send in new rx.   Talbot Grumbling, RN

## 2020-10-14 NOTE — Progress Notes (Signed)
[facility] Visit  Patient consented to have virtual visit and was identified by name and date of birth. Method of visit: Telephone  Encounter participants: Patient: [name] - located at home Provider: Delora Fuel - located at home Others (if applicable): None  Chief Complaint: COVID-19  HPI:  Patient has had symptoms of headache and sore throat that started yesterday.  Also has had symptoms of low grade fever, chills, congestion,  Indicates symptoms feeling worse today. Denies chest pain or difficulty breathing. Was recently exposed to someone at work with symptoms who ended up testing positive 2 days ago.  Vaccinated x2 and boosted x2.     ROS: per HPI  Pertinent PMHx: Hx of Type 2 Diabetes, hx of obesity  Exam:  LMP  (LMP Unknown)   Respiratory: Patient speakin in full sentences without issue.  Assessment/Plan:  COVID-19 virus infection Patient having mild to moderate symptoms with no current indication for hospitalization.  Performed full med rec with patient. Patient has history of Type 2 Diabetes and Obesity, but based most recent A1C and weight patient no longer has either.  Praised patient for these efforts.  Patient indicating she still interested in   Discussed side effects of medication and possibllitiy of rebound symptoms.  Discussed with Pharmacy proper dosing given patient's last CMP.  Indicated appropriate for patient to have full dose. - Prescribe 5 day course of Paxlovid - reach out or go to ED if worsening symptoms or difficulty breathing - Hold Amoldipine and Rosuvastatin for 5 day period while taking Paxlovid - Continue good oral hydration     Time spent during visit with patient: 13 minutes

## 2020-10-15 DIAGNOSIS — U071 COVID-19: Secondary | ICD-10-CM | POA: Insufficient documentation

## 2020-10-15 HISTORY — DX: COVID-19: U07.1

## 2020-10-15 NOTE — Assessment & Plan Note (Signed)
Patient having mild to moderate symptoms with no current indication for hospitalization.  Performed full med rec with patient. Patient has history of Type 2 Diabetes and Obesity, but based most recent A1C and weight patient no longer has either.  Praised patient for these efforts.  Patient indicating she still interested in   Discussed side effects of medication and possibllitiy of rebound symptoms.  Discussed with Pharmacy proper dosing given patient's last CMP.  Indicated appropriate for patient to have full dose. - Prescribe 5 day course of Paxlovid - reach out or go to ED if worsening symptoms or difficulty breathing - Hold Amoldipine and Rosuvastatin for 5 day period while taking Paxlovid - Continue good oral hydration

## 2020-10-25 ENCOUNTER — Other Ambulatory Visit: Payer: Self-pay

## 2020-10-25 ENCOUNTER — Ambulatory Visit (INDEPENDENT_AMBULATORY_CARE_PROVIDER_SITE_OTHER): Payer: No Typology Code available for payment source | Admitting: Sports Medicine

## 2020-10-25 ENCOUNTER — Ambulatory Visit: Payer: Self-pay

## 2020-10-25 VITALS — BP 104/68 | Ht 65.0 in | Wt 168.0 lb

## 2020-10-25 DIAGNOSIS — M25512 Pain in left shoulder: Secondary | ICD-10-CM

## 2020-10-25 NOTE — Progress Notes (Addendum)
Patient ID: [name], female   DOB: 05/01/1960, 60 y.o.   MRN: [id number]  [name] comes in today for an ultrasound of the left shoulder.  Results are as below.  We discussed proceeding with MRI versus subacromial cortisone injection and formal physical therapy.  She would like to think her options over.  Of note, she had prior right shoulder rotator cuff debridement done by Dr. Tamera Punt.  [facility] ultrasound of the left shoulder: Biceps tendon was well visualized without abnormality.  Subscapularis, infraspinatus, and supraspinatus were all well visualized without any obvious abnormality.  Glenohumeral joint visualized without abnormality. Impression: Normal left shoulder ultrasound.

## 2020-10-31 ENCOUNTER — Other Ambulatory Visit (HOSPITAL_COMMUNITY): Payer: Self-pay

## 2020-10-31 MED FILL — Estradiol TD Patch Twice Weekly 0.075 MG/24HR: TRANSDERMAL | 28 days supply | Qty: 8 | Fill #1 | Status: AC

## 2020-11-15 ENCOUNTER — Encounter: Payer: Self-pay | Admitting: Obstetrics and Gynecology

## 2020-11-15 ENCOUNTER — Ambulatory Visit: Payer: No Typology Code available for payment source | Admitting: Obstetrics and Gynecology

## 2020-11-15 ENCOUNTER — Other Ambulatory Visit (HOSPITAL_COMMUNITY): Payer: Self-pay

## 2020-11-15 ENCOUNTER — Other Ambulatory Visit: Payer: Self-pay

## 2020-11-15 VITALS — BP 112/62 | Temp 98.3°F | Ht 65.5 in | Wt 172.0 lb

## 2020-11-15 DIAGNOSIS — L0292 Furuncle, unspecified: Secondary | ICD-10-CM | POA: Diagnosis not present

## 2020-11-15 DIAGNOSIS — R3915 Urgency of urination: Secondary | ICD-10-CM

## 2020-11-15 DIAGNOSIS — R350 Frequency of micturition: Secondary | ICD-10-CM

## 2020-11-15 DIAGNOSIS — M7918 Myalgia, other site: Secondary | ICD-10-CM | POA: Diagnosis not present

## 2020-11-15 MED ORDER — CYCLOBENZAPRINE HCL 5 MG PO TABS
5.0000 mg | ORAL_TABLET | Freq: Three times a day (TID) | ORAL | 0 refills | Status: DC | PRN
  Filled 2020-11-15: qty 20, 7d supply, fill #0

## 2020-11-15 MED ORDER — NITROFURANTOIN MONOHYD MACRO 100 MG PO CAPS
100.0000 mg | ORAL_CAPSULE | Freq: Two times a day (BID) | ORAL | 0 refills | Status: DC
  Filled 2020-11-15: qty 10, 5d supply, fill #0

## 2020-11-15 MED ORDER — PHENAZOPYRIDINE HCL 200 MG PO TABS
200.0000 mg | ORAL_TABLET | Freq: Three times a day (TID) | ORAL | 0 refills | Status: DC
  Filled 2020-11-15: qty 6, 2d supply, fill #0

## 2020-11-15 NOTE — Progress Notes (Signed)
GYNECOLOGY  VISIT   HPI: 60 y.o.   Married Black or [place] American Not Hispanic or Latino  female   [phone] with No LMP recorded (lmp unknown). Patient has had a hysterectomy.   here for pelvic pain, lower back pain, She says that her symptoms started yesterday. She says that she has urine odor. She states that she had some dysuria first thing this morning.  2 days ago she  She has been drinking a lot of tea and dark soda. Sunday she was stuck in traffic, had to hold her urine. When she finally went to the bathroom the urine had a strong odor. She drank lots of water to flush her system.  She started having lower back, spasms/pain yesterday and some lower abdominal pressure.  She has slight increase in urinary frequency and urgency, drinking lots of water. Voiding normal amounts. No dysuria. Odor is gone (just one time).  Also has a little ? Boil on her vulva. She is getting a boil every one in a while.  No bowel c/o.   She had covid a couple of weeks ago.   Last GFR was 57  GYNECOLOGIC HISTORY: No LMP recorded (lmp unknown). Patient has had a hysterectomy. 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   Wrist swelling 04/28/2019   Hyperchloremia 12/30/2018   Lateral epicondylitis of right elbow 12/23/2018   Incomplete tear of rotator cuff 12/23/2018   Paresthesia 11/18/2018   Hot flashes 10/07/2018    Pain in joint of right shoulder 08/25/2018   Carpal tunnel syndrome of left wrist 05/16/2018   Cervical disc disorder with radiculopathy of cervical region 12/11/2016   Vitamin D deficiency 12/04/2016   Left shoulder pain 08/29/2016   Sleep apnea 05/27/2015   Solitary pulmonary nodule 05/27/2015   GERD (gastroesophageal reflux disease) 05/13/2015   DM2 (diabetes mellitus, type 2) ([facility]) 04/12/2015   Female stress incontinence 06/18/2014   VAIN I (vaginal intraepithelial neoplasia grade I)    Hyperlipidemia 10/22/2013   Migraine headache 10/04/2008    Past Medical History:  Diagnosis Date   Anxiety    Arthritis    neck , right knee > than left   Brain tumor (benign) ([facility]) 2018   Chest pain 0/05/4915   Complication of anesthesia    Depression    in the past   Diabetes mellitus without complication (H[facility])    Type II   GERD (gastroesophageal reflux disease)    04/30/2019- "in the past"   H/O varicella    History of measles, mumps, or rubella    HSV-2 (herpes simplex virus 2) infection    Hx of  ovarian cyst    Hyperlipidemia    Hypersomnia 07/19/2015   Hypertension    "sometimes"    Migraine headache    "due to cervical issuses"   Plantar fasciitis    PONV (postoperative nausea and vomiting)    Scapular dyskinesis 10/03/2016   Sleep apnea    pt denies   Sleep deprivation    loss of sleep   VAIN I (vaginal intraepithelial neoplasia grade I) 2013   pap and confirmed by colposcopic biopsy    Past Surgical History:  Procedure Laterality Date   ABDOMINAL HYSTERECTOMY     ANTERIOR CERVICAL DECOMP/DISCECTOMY FUSION N/A 05/07/2019   Procedure: ANTERIOR CERVICAL DECOMPRESSION FUSION CERVICAL 5-6, CERVICAL 6-7 WITH INSTRUMENTATION AND ALLOGRAFT;  Surgeon: Phylliss Bob, MD;  Location: [facility];  Service: Orthopedics;  Laterality: N/A;   CHOLECYSTECTOMY  11/13/10   COLONOSCOPY  Multiple   DILATION AND CURETTAGE OF UTERUS     ESOPHAGOGASTRODUODENOSCOPY     OOPHORECTOMY  2009    bilateral salpingo-oophorectomy.   SHOULDER ARTHROSCOPY WITH ROTATOR CUFF REPAIR Right 08/03/2019   Procedure: SHOULDER ARTHROSCOPY WITH. ROTATOR CUFF DEBRIDEMENT, SUBACROMIAL DECOMPRESSION, DISTAL CLAVICLE EXCISION;  Surgeon: Tania Ade, MD;  Location: [facility];  Service: Orthopedics;  Laterality: Right;   TUBAL LIGATION      Current Outpatient Medications  Medication Sig Dispense Refill   amLODipine (NORVASC) 5 MG tablet Take 1 tablet (5 mg total) by mouth daily. 90 tablet 1   blood glucose meter kit and supplies Dispense based on patient and insurance preference. Use once a day as directed. (FOR ICD-10 E10.9, E11.9). 1 each 0   Elastic Bandages & Supports (WRIST BRACE/LEFT MEDIUM) MISC 1 each by Does not apply route as needed. 1 each 0   estradiol (VIVELLE-DOT) 0.075 MG/24HR PLACE 1 PATCH ONTO THE SKIN 2 (TWO) TIMES A WEEK. 24 patch 3   ezetimibe (ZETIA) 10 MG tablet Take 1 tablet (10 mg total) by mouth daily. 90 tablet 3   fluticasone (FLONASE SENSIMIST) 27.5 MCG/SPRAY nasal spray Place 2 sprays into the nose daily. 9.1 mL 12   ibuprofen (ADVIL) 600 MG tablet TAKE 1 TABLET (600 MG TOTAL) BY MOUTH EVERY 8 (EIGHT) HOURS AS NEEDED. 90 tablet 2   montelukast (SINGULAIR) 10 MG tablet Take 1 tablet (10 mg total) by mouth at bedtime. 90 tablet 1   rosuvastatin (CRESTOR) 40 MG tablet Take 1 tablet (40 mg total) by mouth at bedtime. 90 tablet 1   Semaglutide-Weight Management 2.4 MG/0.75ML SOAJ INJECT 2.4 MG (0.75 ML) INTO THE SKIN EVERY 7 (SEVEN) DAYS. 3 mL 3   No current facility-administered medications for this visit.     ALLERGIES: Sulfa antibiotics  Family History  Problem Relation Age of Onset   Hypertension Mother    Hypotension Mother    Anemia Mother        low iron   Heart disease Sister    Sickle cell trait Other    Alcohol abuse Brother    Alcohol abuse Brother    Aneurysm Brother    Drug abuse Brother    Colon cancer Neg Hx    Esophageal cancer Neg  Hx    Stomach cancer Neg Hx    Breast cancer Neg Hx     Social History   Socioeconomic History   Marital status: Married    Spouse name: [name]   Number of children: 2   Years of education: 14   Highest education level: Associate degree: academic program  Occupational  History    Employer: [employer]  Tobacco Use   Smoking status: Former    Packs/day: 0.50    Years: 30.00    Pack years: 15.00    Types: Cigarettes    Quit date: 2012    Years since quitting: 10.6   Smokeless tobacco: Never  Vaping Use   Vaping Use: Never used  Substance and Sexual Activity   Alcohol use: No    Alcohol/week: 0.0 standard drinks   Drug use: No   Sexual activity: Yes    Partners: Male    Birth control/protection: Surgical    Comment: Hysterectomy  Other Topics Concern   Not on file  Social History Narrative   Patient lives at home with her husband [name]). 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 Two children. Both boys      Patient is right-handed. She lives in a one level home. She drinks coffee occasionally. She does not regularly exercise.   Social Determinants of Health   Financial Resource Strain: Not on file  Food Insecurity: Not on file  Transportation Needs: Not on file  Physical Activity: Not on file  Stress: Not on file  Social Connections: Not on file  Intimate Partner Violence: Not on file    ROS  PHYSICAL EXAMINATION:    BP 112/62   Temp 98.3 F (36.8 C)   Ht 5' 5.5" (1.664 m)   Wt 172 lb (78 kg)   LMP  (LMP Unknown)   BMI 28.19 kg/m     General appearance: alert, cooperative and appears stated age Lower back: tender to palpation Abdomen: soft, mildly tender in the SP region; non distended, no masses,  no organomegaly  Pelvic: External genitalia:  no lesions              Urethra:  normal appearing urethra with no masses, tenderness or lesions              Bartholins and Skenes:  normal                 Vagina: normal appearing vagina with normal color and discharge, no lesions              Cervix: no cervical motion tenderness and no lesions              Bimanual Exam:  Uterus:  normal size, contour, position, consistency, mobility, non-tender              Adnexa: no mass, fullness, tenderness              Bladder: tender to palpation  Pelvic floor: not tender to palpation.   Chaperone was present for exam.  1. Urinary urgency Bladder tender on exam - nitrofurantoin, macrocrystal-monohydrate, (MACROBID) 100 MG capsule; Take 1 capsule (100 mg total) by mouth 2 (two) times daily.  Dispense: 10 capsule; Refill: 0 - phenazopyridine (PYRIDIUM) 200 MG tablet; Take 1 tablet (200 mg total) by mouth 3 (three) times daily with meals.  Dispense: 6 tablet; Refill: 0 - Urinalysis,Complete w/RFL Culture  2. Urinary frequency - Urinalysis,Complete w/RFL Culture  3. Musculoskeletal pain Discussed using heat and ice, ibuprofen - cyclobenzaprine (FLEXERIL) 5 MG tablet; Take 1 tablet (5 mg total) by mouth 3 (three) times daily as needed for muscle spasms.  Dispense: 20 tablet; Refill: 0  4. Boil Warm compresses and hot soaks.   Over 30 minutes in total  patient care  Addendum: above medications are fine with a GFR of 57  CC: Dr Quincy Simmonds

## 2020-11-15 NOTE — Patient Instructions (Signed)
Skin Abscess  A skin abscess is an infected area on or under your skin that contains a collection of pus and other material. An abscess may also be called a furuncle,carbuncle, or boil. An abscess can occur in or on almost any part of your body. Some abscesses break open (rupture) on their own. Most continue to get worse unless they are treated. The infection can spread deeper into the body and eventually into your blood, whichcan make you feel ill. Treatment usually involves draining the abscess. What are the causes? An abscess occurs when germs, like bacteria, pass through your skin and cause an infection. This may be caused by: A scrape or cut on your skin. A puncture wound through your skin, including a needle injection or insect bite. Blocked oil or sweat glands. Blocked and infected hair follicles. A cyst that forms beneath your skin (sebaceous cyst) and becomes infected. What increases the risk? This condition is more likely to develop in people who: Have a weak body defense system (immune system). Have diabetes. Have dry and irritated skin. Get frequent injections or use illegal IV drugs. Have a foreign body in a wound, such as a splinter. Have problems with their lymph system or veins. What are the signs or symptoms? Symptoms of this condition include: A painful, firm bump under the skin. A bump with pus at the top. This may break through the skin and drain. Other symptoms include: Redness surrounding the abscess site. Warmth. Swelling of the lymph nodes (glands) near the abscess. Tenderness. A sore on the skin. How is this diagnosed? This condition may be diagnosed based on: A physical exam. Your medical history. A sample of pus. This may be used to find out what is causing the infection. Blood tests. Imaging tests, such as an ultrasound, CT scan, or MRI. How is this treated? A small abscess that drains on its own may not need treatment. Treatment for larger abscesses  may include: Moist heat or heat pack applied to the area several times a day. A procedure to drain the abscess (incision and drainage). Antibiotic medicines. For a severe abscess, you may first get antibiotics through an IV and then change to antibiotics by mouth. Follow these instructions at home: Medicines  Take over-the-counter and prescription medicines only as told by your health care provider. If you were prescribed an antibiotic medicine, take it as told by your health care provider. Do not stop taking the antibiotic even if you start to feel better.  Abscess care  If you have an abscess that has not drained, apply heat to the affected area. Use the heat source that your health care provider recommends, such as a moist heat pack or a heating pad. Place a towel between your skin and the heat source. Leave the heat on for 20-30 minutes. Remove the heat if your skin turns bright red. This is especially important if you are unable to feel pain, heat, or cold. You may have a greater risk of getting burned. Follow instructions from your health care provider about how to take care of your abscess. Make sure you: Cover the abscess with a bandage (dressing). Change your dressing or gauze as told by your health care provider. Wash your hands with soap and water before you change the dressing or gauze. If soap and water are not available, use hand sanitizer. Check your abscess every day for signs of a worsening infection. Check for: More redness, swelling, or pain. More fluid or blood. Warmth. More   pus or a bad smell.  General instructions To avoid spreading the infection: Do not share personal care items, towels, or hot tubs with others. Avoid making skin contact with other people. Keep all follow-up visits as told by your health care provider. This is important. Contact a health care provider if you have: More redness, swelling, or pain around your abscess. More fluid or blood coming  from your abscess. Warm skin around your abscess. More pus or a bad smell coming from your abscess. A fever. Muscle aches. Chills or a general ill feeling. Get help right away if you: Have severe pain. See red streaks on your skin spreading away from the abscess. Summary A skin abscess is an infected area on or under your skin that contains a collection of pus and other material. A small abscess that drains on its own may not need treatment. Treatment for larger abscesses may include having a procedure to drain the abscess and taking an antibiotic. This information is not intended to replace advice given to you by your health care provider. Make sure you discuss any questions you have with your healthcare provider. Document Revised: 06/26/2018 Document Reviewed: 04/18/2017 Elsevier Patient Education  2022 [employer]. Acute Back Pain, Adult Acute back pain is sudden and usually short-lived. It is often caused by an injury to the muscles and tissues in the back. The injury may result from: A muscle or ligament getting overstretched or torn (strained). Ligaments are tissues that connect bones to each other. Lifting something improperly can cause a back strain. Wear and tear (degeneration) of the spinal disks. Spinal disks are circular tissue that provide cushioning between the bones of the spine (vertebrae). Twisting motions, such as while playing sports or doing yard work. A hit to the back. Arthritis. You may have a physical exam, lab tests, and imaging tests to find the cause ofyour pain. Acute back pain usually goes away with rest and home care. Follow these instructions at home: Managing pain, stiffness, and swelling Treatment may include medicines for pain and inflammation that are taken by mouth or applied to the skin, prescription pain medicine, or muscle relaxants. Take over-the-counter and prescription medicines only as told by your health care provider. Your health care provider  may recommend applying ice during the first 24-48 hours after your pain starts. To do this: Put ice in a plastic bag. Place a towel between your skin and the bag. Leave the ice on for 20 minutes, 2-3 times a day. If directed, apply heat to the affected area as often as told by your health care provider. Use the heat source that your health care provider recommends, such as a moist heat pack or a heating pad. Place a towel between your skin and the heat source. Leave the heat on for 20-30 minutes. Remove the heat if your skin turns bright red. This is especially important if you are unable to feel pain, heat, or cold. You have a greater risk of getting burned. Activity  Do not stay in bed. Staying in bed for more than 1-2 days can delay your recovery. Sit up and stand up straight. Avoid leaning forward when you sit or hunching over when you stand. If you work at a desk, sit close to it so you do not need to lean over. Keep your chin tucked in. Keep your neck drawn back, and keep your elbows bent at a 90-degree angle (right angle). Sit high and close to the steering wheel when you drive.  Add lower back (lumbar) support to your car seat, if needed. Take short walks on even surfaces as soon as you are able. Try to increase the length of time you walk each day. Do not sit, drive, or stand in one place for more than 30 minutes at a time. Sitting or standing for long periods of time can put stress on your back. Do not drive or use heavy machinery while taking prescription pain medicine. Use proper lifting techniques. When you bend and lift, use positions that put less stress on your back: [place] your knees. Keep the load close to your body. Avoid twisting. Exercise regularly as told by your health care provider. Exercising helps your back heal faster and helps prevent back injuries by keeping muscles strong and flexible. Work with a physical therapist to make a safe exercise program, as recommended by  your health care provider. Do any exercises as told by your physical therapist.  Lifestyle Maintain a healthy weight. Extra weight puts stress on your back and makes it difficult to have good posture. Avoid activities or situations that make you feel anxious or stressed. Stress and anxiety increase muscle tension and can make back pain worse. Learn ways to manage anxiety and stress, such as through exercise. General instructions Sleep on a firm mattress in a comfortable position. Try lying on your side with your knees slightly bent. If you lie on your back, put a pillow under your knees. Follow your treatment plan as told by your health care provider. This may include: Cognitive or behavioral therapy. Acupuncture or massage therapy. Meditation or yoga. Contact a health care provider if: You have pain that is not relieved with rest or medicine. You have increasing pain going down into your legs or buttocks. Your pain does not improve after 2 weeks. You have pain at night. You lose weight without trying. You have a fever or chills. Get help right away if: You develop new bowel or bladder control problems. You have unusual weakness or numbness in your arms or legs. You develop nausea or vomiting. You develop abdominal pain. You feel faint. Summary Acute back pain is sudden and usually short-lived. Use proper lifting techniques. When you bend and lift, use positions that put less stress on your back. Take over-the-counter and prescription medicines and apply heat or ice as directed by your health care provider. This information is not intended to replace advice given to you by your health care provider. Make sure you discuss any questions you have with your healthcare provider. Document Revised: 11/24/2019 Document Reviewed: 11/27/2019 Elsevier Patient Education  2022 [employer]. Urinary Tract Infection, Adult  A urinary tract infection (UTI) is an infection of any part of the urinary  tract. The urinary tract includes the kidneys, ureters, bladder, and urethra.These organs make, store, and get rid of urine in the body. An upper UTI affects the ureters and kidneys. A lower UTI affects the bladderand urethra. What are the causes? Most urinary tract infections are caused by bacteria in your genital area around your urethra, where urine leaves your body. These bacteria grow andcause inflammation of your urinary tract. What increases the risk? You are more likely to develop this condition if: You have a urinary catheter that stays in place. You are not able to control when you urinate or have a bowel movement (incontinence). You are female and you: Use a spermicide or diaphragm for birth control. Have low estrogen levels. Are pregnant. You have certain genes that increase your  risk. You are sexually active. You take antibiotic medicines. You have a condition that causes your flow of urine to slow down, such as: An enlarged prostate, if you are female. Blockage in your urethra. A kidney stone. A nerve condition that affects your bladder control (neurogenic bladder). Not getting enough to drink, or not urinating often. You have certain medical conditions, such as: Diabetes. A weak disease-fighting system (immunesystem). Sickle cell disease. Gout. Spinal cord injury. What are the signs or symptoms? Symptoms of this condition include: Needing to urinate right away (urgency). Frequent urination. This may include small amounts of urine each time you urinate. Pain or burning with urination. Blood in the urine. Urine that smells bad or unusual. Trouble urinating. Cloudy urine. Vaginal discharge, if you are female. Pain in the abdomen or the lower back. You may also have: Vomiting or a decreased appetite. Confusion. Irritability or tiredness. A fever or chills. Diarrhea. The first symptom in older adults may be confusion. In some cases, they may nothave any symptoms  until the infection has worsened. How is this diagnosed? This condition is diagnosed based on your medical history and a physical exam. You may also have other tests, including: Urine tests. Blood tests. Tests for STIs (sexually transmitted infections). If you have had more than one UTI, a cystoscopy or imaging studies may be doneto determine the cause of the infections. How is this treated? Treatment for this condition includes: Antibiotic medicine. Over-the-counter medicines to treat discomfort. Drinking enough water to stay hydrated. If you have frequent infections or have other conditions such as a kidney stone, you may need to see a health care provider who specializes in the urinary tract (urologist). In rare cases, urinary tract infections can cause sepsis. Sepsis is a life-threatening condition that occurs when the body responds to an infection. Sepsis is treated in the hospital with IV antibiotics, fluids, and othermedicines. Follow these instructions at home:  Medicines Take over-the-counter and prescription medicines only as told by your health care provider. If you were prescribed an antibiotic medicine, take it as told by your health care provider. Do not stop using the antibiotic even if you start to feel better. General instructions Make sure you: Empty your bladder often and completely. Do not hold urine for long periods of time. Empty your bladder after sex. Wipe from front to back after urinating or having a bowel movement if you are female. Use each tissue only one time when you wipe. Drink enough fluid to keep your urine pale yellow. Keep all follow-up visits. This is important. Contact a health care provider if: Your symptoms do not get better after 1-2 days. Your symptoms go away and then return. Get help right away if: You have severe pain in your back or your lower abdomen. You have a fever or chills. You have nausea or vomiting. Summary A urinary tract  infection (UTI) is an infection of any part of the urinary tract, which includes the kidneys, ureters, bladder, and urethra. Most urinary tract infections are caused by bacteria in your genital area. Treatment for this condition often includes antibiotic medicines. If you were prescribed an antibiotic medicine, take it as told by your health care provider. Do not stop using the antibiotic even if you start to feel better. Keep all follow-up visits. This is important. This information is not intended to replace advice given to you by your health care provider. Make sure you discuss any questions you have with your healthcare provider. Document Revised: 10/16/2019  Document Reviewed: 10/16/2019 Elsevier Patient Education  [place].

## 2020-11-18 LAB — URINALYSIS, COMPLETE W/RFL CULTURE
Bilirubin Urine: NEGATIVE
Casts: NONE SEEN /LPF
Crystals: NONE SEEN /HPF
Glucose, UA: NEGATIVE
Hgb urine dipstick: NEGATIVE
Hyaline Cast: NONE SEEN /LPF
Ketones, ur: NEGATIVE
Nitrites, Initial: NEGATIVE
Protein, ur: NEGATIVE
Specific Gravity, Urine: 1.01 (ref 1.001–1.035)
Yeast: NONE SEEN /HPF
pH: 7 (ref 5.0–8.0)

## 2020-11-18 LAB — URINE CULTURE
MICRO NUMBER:: 12309296
SPECIMEN QUALITY:: ADEQUATE

## 2020-11-18 LAB — CULTURE INDICATED

## 2020-12-15 ENCOUNTER — Other Ambulatory Visit (HOSPITAL_COMMUNITY): Payer: Self-pay

## 2020-12-15 MED FILL — Estradiol TD Patch Twice Weekly 0.075 MG/24HR: TRANSDERMAL | 28 days supply | Qty: 8 | Fill #2 | Status: AC

## 2020-12-21 ENCOUNTER — Other Ambulatory Visit: Payer: Self-pay

## 2020-12-21 ENCOUNTER — Ambulatory Visit (INDEPENDENT_AMBULATORY_CARE_PROVIDER_SITE_OTHER): Payer: No Typology Code available for payment source

## 2020-12-21 DIAGNOSIS — Z23 Encounter for immunization: Secondary | ICD-10-CM

## 2020-12-30 ENCOUNTER — Other Ambulatory Visit (HOSPITAL_COMMUNITY): Payer: Self-pay

## 2021-01-03 ENCOUNTER — Encounter: Payer: Self-pay | Admitting: Family Medicine

## 2021-01-05 ENCOUNTER — Telehealth: Payer: Self-pay

## 2021-01-05 NOTE — Telephone Encounter (Signed)
[facility] PA form in in PCP box for review. Please return back to RN team once completed.

## 2021-01-05 NOTE — Telephone Encounter (Signed)
PA faxed and scanned into chart.

## 2021-01-05 NOTE — Telephone Encounter (Signed)
Form completed and was returned to [facility].

## 2021-01-06 ENCOUNTER — Other Ambulatory Visit: Payer: No Typology Code available for payment source

## 2021-01-06 ENCOUNTER — Other Ambulatory Visit: Payer: Self-pay | Admitting: Family Medicine

## 2021-01-06 ENCOUNTER — Other Ambulatory Visit: Payer: Self-pay

## 2021-01-06 ENCOUNTER — Other Ambulatory Visit (HOSPITAL_COMMUNITY): Payer: Self-pay

## 2021-01-06 ENCOUNTER — Ambulatory Visit (INDEPENDENT_AMBULATORY_CARE_PROVIDER_SITE_OTHER): Payer: No Typology Code available for payment source | Admitting: Family Medicine

## 2021-01-06 ENCOUNTER — Encounter: Payer: Self-pay | Admitting: Family Medicine

## 2021-01-06 VITALS — BP 129/90 | HR 74 | Ht 65.5 in | Wt 182.0 lb

## 2021-01-06 DIAGNOSIS — I10 Essential (primary) hypertension: Secondary | ICD-10-CM

## 2021-01-06 DIAGNOSIS — M25512 Pain in left shoulder: Secondary | ICD-10-CM

## 2021-01-06 DIAGNOSIS — E1169 Type 2 diabetes mellitus with other specified complication: Secondary | ICD-10-CM | POA: Diagnosis not present

## 2021-01-06 DIAGNOSIS — M5416 Radiculopathy, lumbar region: Secondary | ICD-10-CM

## 2021-01-06 DIAGNOSIS — G8929 Other chronic pain: Secondary | ICD-10-CM

## 2021-01-06 LAB — POCT GLYCOSYLATED HEMOGLOBIN (HGB A1C): HbA1c, POC (controlled diabetic range): 5.8 % (ref 0.0–7.0)

## 2021-01-06 MED ORDER — GABAPENTIN 100 MG PO CAPS
100.0000 mg | ORAL_CAPSULE | Freq: Three times a day (TID) | ORAL | 1 refills | Status: DC
  Filled 2021-01-06: qty 90, 30d supply, fill #0

## 2021-01-06 MED ORDER — LOSARTAN POTASSIUM 25 MG PO TABS
25.0000 mg | ORAL_TABLET | Freq: Every day | ORAL | 1 refills | Status: DC
  Filled 2021-01-06: qty 90, 90d supply, fill #0

## 2021-01-06 MED ORDER — OZEMPIC (0.25 OR 0.5 MG/DOSE) 2 MG/1.5ML ~~LOC~~ SOPN: 0.2500 mg | PEN_INJECTOR | SUBCUTANEOUS | 0 refills | Status: AC

## 2021-01-06 NOTE — Assessment & Plan Note (Signed)
A1C looks great today. I worry that now that she is regaining her weight, her DM might worsens which is now currently controlled. Prior auth for [facility] already initiated. I gave her sample dose of Ozempic to start at 0.25 mg weekly pending [facility]'s approval, as she had tried Ozempic in the past. F/U in the next 3-4 month for assessment.

## 2021-01-06 NOTE — Patient Instructions (Signed)
Back Exercises These exercises help to make your trunk and back strong. They also help to keep the lower back flexible. Doing these exercises can help to prevent or lessen pain in your lower back. If you have back pain, try to do these exercises 2-3 times each day or as told by your doctor. As you get better, do the exercises once each day. Repeat the exercises more often as told by your doctor. To stop back pain from coming back, do the exercises once each day, or as told by your doctor. Do exercises exactly as told by your doctor. Stop right away if you feel sudden pain or your pain gets worse. Exercises Single knee to chest Do these steps 3-5 times in a row for each leg: Lie on your back on a firm bed or the floor with your legs stretched out. Bring one knee to your chest. Grab your knee or thigh with both hands and hold it in place. Pull on your knee until you feel a gentle stretch in your lower back or butt. Keep doing the stretch for 10-30 seconds. Slowly let go of your leg and straighten it. Pelvic tilt Do these steps 5-10 times in a row: Lie on your back on a firm bed or the floor with your legs stretched out. Bend your knees so they point up to the ceiling. Your feet should be flat on the floor. Tighten your lower belly (abdomen) muscles to press your lower back against the floor. This will make your tailbone point up to the ceiling instead of pointing down to your feet or the floor. Stay in this position for 5-10 seconds while you gently tighten your muscles and breathe evenly. Cat-cow Do these steps until your lower back bends more easily: Get on your hands and knees on a firm bed or the floor. Keep your hands under your shoulders, and keep your knees under your hips. You may put padding under your knees. Let your head hang down toward your chest. Tighten (contract) the muscles in your belly. Point your tailbone toward the floor so your lower back becomes rounded like the back of a  cat. Stay in this position for 5 seconds. Slowly lift your head. Let the muscles of your belly relax. Point your tailbone up toward the ceiling so your back forms a sagging arch like the back of a cow. Stay in this position for 5 seconds.  Press-ups Do these steps 5-10 times in a row: Lie on your belly (face-down) on a firm bed or the floor. Place your hands near your head, about shoulder-width apart. While you keep your back relaxed and keep your hips on the floor, slowly straighten your arms to raise the top half of your body and lift your shoulders. Do not use your back muscles. You may change where you place your hands to make yourself more comfortable. Stay in this position for 5 seconds. Keep your back relaxed. Slowly return to lying flat on the floor.  Bridges Do these steps 10 times in a row: Lie on your back on a firm bed or the floor. Bend your knees so they point up to the ceiling. Your feet should be flat on the floor. Your arms should be flat at your sides, next to your body. Tighten your butt muscles and lift your butt off the floor until your waist is almost as high as your knees. If you do not feel the muscles working in your butt and the back of   your thighs, slide your feet 1-2 inches (2.5-5 cm) farther away from your butt. Stay in this position for 3-5 seconds. Slowly lower your butt to the floor, and let your butt muscles relax. If this exercise is too easy, try doing it with your arms crossed over your chest. Belly crunches Do these steps 5-10 times in a row: Lie on your back on a firm bed or the floor with your legs stretched out. Bend your knees so they point up to the ceiling. Your feet should be flat on the floor. Cross your arms over your chest. Tip your chin a little bit toward your chest, but do not bend your neck. Tighten your belly muscles and slowly raise your chest just enough to lift your shoulder blades a tiny bit off the floor. Avoid raising your body  higher than that because it can put too much stress on your lower back. Slowly lower your chest and your head to the floor. Back lifts Do these steps 5-10 times in a row: Lie on your belly (face-down) with your arms at your sides, and rest your forehead on the floor. Tighten the muscles in your legs and your butt. Slowly lift your chest off the floor while you keep your hips on the floor. Keep the back of your head in line with the curve in your back. Look at the floor while you do this. Stay in this position for 3-5 seconds. Slowly lower your chest and your face to the floor. Contact a doctor if: Your back pain gets a lot worse when you do an exercise. Your back pain does not get better within 2 hours after you exercise. If you have any of these problems, stop doing the exercises. Do not do them again unless your doctor says it is okay. Get help right away if: You have sudden, very bad back pain. If this happens, stop doing the exercises. Do not do them again unless your doctor says it is okay. This information is not intended to replace advice given to you by your health care provider. Make sure you discuss any questions you have with your health care provider. Document Revised: 05/18/2020 Document Reviewed: 05/18/2020 Elsevier Patient Education  [employer].

## 2021-01-06 NOTE — Progress Notes (Signed)
SUBJECTIVE:   CHIEF COMPLAINT / HPI:   Hypertension Progression since onset: Patient is here for follow-up. She feels that her BP is better and would like to get off Norvasc. Her last dose was last night. No other concerns.  Back Pain This is a new problem. Episode onset: ongoing for 2-3 weeks. The problem occurs constantly. The problem has been waxing and waning since onset. The pain is present in the lumbar spine. The pain radiates to the right thigh. The pain is at a severity of 6/10 (Sometimes when it is very bad, it is about 10/10 in severity). The symptoms are aggravated by position. Pertinent negatives include no bladder incontinence, bowel incontinence, paresis, paresthesias or weakness. Risk factors: History of fall at work in 2013. Treatments tried: Hx of back pain many years ago and was getting back injection with Dr. Nelva Bush. Currently, she uses Tylenol and Ibuprofen as needed for pain.  Shoulder Pain  The pain is present in the left shoulder. This is a chronic problem. The current episode started more than 1 year ago. The problem occurs intermittently. The problem has been waxing and waning. The pain is moderate. Associated symptoms comments: She is concern about a new knot over her left shoulder which is mildly painful. No redness. Treatments tried: She was evaluated by sports medicine group who recommended steroid injection and MRI of her shoulder.    DM2/Weight management: Here for A1C check. She ran out of her Wegovy 3 weeks ago and had not been able to get it refilled due to prior authorization requirement. She had gained more weight since being off of the [facility] and it feels like she gets hungry more than usual in the last few weeks.  PERTINENT  PMH / PSH: PMHx reviewed  OBJECTIVE:   BP 129/90   Pulse 74   Ht 5' 5.5" (1.664 m)   Wt 182 lb (82.6 kg)   LMP  (LMP Unknown)   SpO2 100%   BMI 29.83 kg/m   Physical Exam Vitals and nursing note reviewed.  Cardiovascular:      Rate and Rhythm: Normal rate and regular rhythm.     Heart sounds: Normal heart sounds. No murmur heard. Pulmonary:     Effort: Pulmonary effort is normal. No respiratory distress.     Breath sounds: Normal breath sounds. No wheezing.  Abdominal:     General: There is no distension.     Palpations: Abdomen is soft. There is no mass.     Tenderness: There is no abdominal tenderness.  Musculoskeletal:     Right shoulder: Normal range of motion.     Left shoulder: Normal range of motion.     Lumbar back: No swelling. Normal range of motion.     Right lower leg: No edema.     Left lower leg: No edema.     Comments: Small, very mildly tender nodular mass, about 2-3 cm in size over her left acromioclavicular joint.  Very mild tenderness of her lumbosacral joint. + right straight leg raising test.  Neurological:     General: No focal deficit present.     Mental Status: She is oriented to person, place, and time.     Sensory: Sensation is intact.     Motor: Motor function is intact.     Gait: Gait is intact.     Deep Tendon Reflexes: Reflexes are normal and symmetric.     ASSESSMENT/PLAN:   Essential hypertension Her BP looks great on meds.  However, I discussed switching from Norvasc to ARBs given her hx of DM. Start Losartan 25 mg qd. D/C Norvasc. Continue home BP monitoring. She agreed with the plan.  DM2 (diabetes mellitus, type 2) (HCC) A1C looks great today. I worry that now that she is regaining her weight, her DM might worsens which is now currently controlled. Prior auth for [employer] already initiated. I gave her sample dose of Ozempic to start at 0.25 mg weekly pending Wegovy's approval, as she had tried Ozempic in the past. F/U in the next 3-4 month for assessment.  Morbid obesity ([place]) Ozempic sample given while awaiting prior auth for her [facility].  Left shoulder pain Recent xray normal. Now has a knot on her shoulder. ?? Ganglion cyst. MRI recommended for  shoulder pain. She prefers to defer MRI and PT for now. Continue Tylenol as needed. F/U with Sports med clinic as planned.  Lumbar radiculopathy No neurologic deficit. Trial of Gabapentin. Creatinine was slightly elevated 8 months ago. I rechecked her Bmet today and if Kidney function is good, I will start her on NSAID as well (Ibuprofen/Naproxen). She agreed with the plan. Home back exercise handout provided.   She declined PCV 20 and COVID booster today.  NB: Unfortunately, she did not get her Bmet done prior to leaving. I called and she stated that she plan on coming later today to get lab done. Future order placed.  Andrena Mews, MD [place]

## 2021-01-06 NOTE — Assessment & Plan Note (Signed)
No neurologic deficit. Trial of Gabapentin. Creatinine was slightly elevated 8 months ago. I rechecked her Bmet today and if Kidney function is good, I will start her on NSAID as well (Ibuprofen/Naproxen). She agreed with the plan. Home back exercise handout provided.

## 2021-01-06 NOTE — Assessment & Plan Note (Signed)
Her BP looks great on meds. However, I discussed switching from Norvasc to ARBs given her hx of DM. Start Losartan 25 mg qd. D/C Norvasc. Continue home BP monitoring. She agreed with the plan.

## 2021-01-06 NOTE — Assessment & Plan Note (Signed)
Ozempic sample given while awaiting prior auth for her [facility].

## 2021-01-06 NOTE — Assessment & Plan Note (Signed)
Recent xray normal. Now has a knot on her shoulder. ?? Ganglion cyst. MRI recommended for shoulder pain. She prefers to defer MRI and PT for now. Continue Tylenol as needed. F/U with Sports med clinic as planned.

## 2021-01-07 ENCOUNTER — Telehealth: Payer: Self-pay | Admitting: Family Medicine

## 2021-01-07 LAB — BASIC METABOLIC PANEL
BUN/Creatinine Ratio: 12 (ref 9–23)
BUN: 11 mg/dL (ref 6–24)
CO2: 25 mmol/L (ref 20–29)
Calcium: 9.5 mg/dL (ref 8.7–10.2)
Chloride: 106 mmol/L (ref 96–106)
Creatinine, Ser: 0.9 mg/dL (ref 0.57–1.00)
Glucose: 102 mg/dL — ABNORMAL HIGH (ref 70–99)
Potassium: 4 mmol/L (ref 3.5–5.2)
Sodium: 143 mmol/L (ref 134–144)
eGFR: 74 mL/min/{1.73_m2} (ref >59)

## 2021-01-07 MED ORDER — NAPROXEN SODIUM 550 MG PO TABS
550.0000 mg | ORAL_TABLET | Freq: Two times a day (BID) | ORAL | 1 refills | Status: DC | PRN
  Filled 2021-01-07: qty 60, 30d supply, fill #0
  Filled 2021-03-09: qty 60, 30d supply, fill #1

## 2021-01-07 NOTE — Telephone Encounter (Signed)
Error

## 2021-01-09 ENCOUNTER — Other Ambulatory Visit (HOSPITAL_COMMUNITY): Payer: Self-pay

## 2021-01-11 ENCOUNTER — Other Ambulatory Visit (HOSPITAL_COMMUNITY): Payer: Self-pay

## 2021-01-11 MED ORDER — CARESTART COVID-19 HOME TEST VI KIT
PACK | 0 refills | Status: DC
Start: 2021-01-11 — End: 2021-02-01
  Filled 2021-01-11: qty 4, 4d supply, fill #0

## 2021-01-12 ENCOUNTER — Other Ambulatory Visit: Payer: Self-pay | Admitting: Obstetrics and Gynecology

## 2021-01-12 DIAGNOSIS — Z1231 Encounter for screening mammogram for malignant neoplasm of breast: Secondary | ICD-10-CM

## 2021-01-24 ENCOUNTER — Other Ambulatory Visit (HOSPITAL_COMMUNITY): Payer: Self-pay

## 2021-01-24 ENCOUNTER — Other Ambulatory Visit: Payer: Self-pay | Admitting: Obstetrics and Gynecology

## 2021-01-24 MED ORDER — ESTRADIOL 0.075 MG/24HR TD PTTW
1.0000 | MEDICATED_PATCH | TRANSDERMAL | 0 refills | Status: DC
  Filled 2021-01-24: qty 24, 84d supply, fill #0

## 2021-01-24 NOTE — Telephone Encounter (Signed)
Last AEX 12/31/2019. Scheduled AEX 02/01/2021.  Last Mammo 01/29/2020 negative. Scheduled mammo 02/16/21.

## 2021-01-31 NOTE — Progress Notes (Signed)
60 y.o. [id number] Married [place] American female here for annual exam.    Having some RLQ pain recently. Can be painful to lift her right leg.  Was taking Gabapentin, which did not help.  Now taking Naprosyn.   E Coli UTI 11/15/20.   Some vaginal dryness.   New dx of HTN.   A1C 5.8.   Lots of family deaths in the last year.   PCP: Andrena Mews, MD  No LMP recorded (lmp unknown). Patient has had a hysterectomy.           Sexually active: Yes.    The current method of family planning is status post hysterectomy.    Exercising: No.   Some walking Smoker:  no  Health Maintenance: Pap:   12-31-19 Neg:Neg HR HPV, 11/12/16 Vag pap Neg, 10-14-13 vag.pap Neg:Neg HR HPV History of abnormal Pap:  Yes, LGSIL on pap and vaginal biopsy 2013.  MMG:  01-29-20 3D/Neg/BiRads1.  Has appointment.  Colonoscopy:  2014;next 10 years BMD:   n/a  Result  n/a TDaP:  03-04-20 Gardasil:   no HIV: 11-18-18 NR Hep C: 12-11-16 Neg Screening Labs: PCP Received flu vaccine and Covid booster.   reports that she quit smoking about 10 years ago. Her smoking use included cigarettes. She has a 15.00 pack-year smoking history. She has never used smokeless tobacco. She reports that she does not drink alcohol and does not use drugs.  Past Medical History:  Diagnosis Date   Anxiety    Arthritis    neck , right knee > than left   Brain tumor (benign) ([place]) 2018   Chest pain 6/38/4665   Complication of anesthesia    Depression    in the past   Diabetes mellitus without complication ([place])    Type II   GERD (gastroesophageal reflux disease)    04/30/2019- "in the past"   H/O varicella    History of measles, mumps, or rubella    HSV-2 (herpes simplex virus 2) infection    Hx of ovarian cyst    Hyperlipidemia    Hypersomnia 07/19/2015   Hypertension    "sometimes"    Migraine headache    "due to cervical issuses"   Plantar fasciitis    PONV (postoperative nausea and vomiting)    Scapular dyskinesis  10/03/2016   Sleep apnea    pt denies   Sleep deprivation    loss of sleep   VAIN I (vaginal intraepithelial neoplasia grade I) 2013   pap and confirmed by colposcopic biopsy    Past Surgical History:  Procedure Laterality Date   ABDOMINAL HYSTERECTOMY     ANTERIOR CERVICAL DECOMP/DISCECTOMY FUSION N/A 05/07/2019   Procedure: ANTERIOR CERVICAL DECOMPRESSION FUSION CERVICAL 5-6, CERVICAL 6-7 WITH INSTRUMENTATION AND ALLOGRAFT;  Surgeon: Phylliss Bob, MD;  Location: [place] [place];  Service: Orthopedics;  Laterality: N/A;   CHOLECYSTECTOMY  11/13/10   COLONOSCOPY  Multiple   DILATION AND CURETTAGE OF UTERUS     ESOPHAGOGASTRODUODENOSCOPY     OOPHORECTOMY  2009   bilateral salpingo-oophorectomy.   SHOULDER ARTHROSCOPY WITH ROTATOR CUFF REPAIR Right 08/03/2019   Procedure: SHOULDER ARTHROSCOPY WITH. ROTATOR CUFF DEBRIDEMENT, SUBACROMIAL DECOMPRESSION, DISTAL CLAVICLE EXCISION;  Surgeon: Tania Ade, MD;  Location: [place];  Service: Orthopedics;  Laterality: Right;   TUBAL LIGATION      Current Outpatient Medications  Medication Sig Dispense Refill   blood glucose meter kit and supplies Dispense based on patient and insurance preference. Use once a day as directed. (FOR  ICD-10 E10.9, E11.9). 1 each 0   Elastic Bandages & Supports (WRIST BRACE/LEFT MEDIUM) MISC 1 each by Does not apply route as needed. 1 each 0   estradiol (VIVELLE-DOT) 0.075 MG/24HR Place 1 patch onto the skin 2 (two) times a week. 24 patch 0   ezetimibe (ZETIA) 10 MG tablet Take 1 tablet (10 mg total) by mouth daily. 90 tablet 3   losartan (COZAAR) 25 MG tablet Take 1 tablet (25 mg total) by mouth daily. 90 tablet 1   montelukast (SINGULAIR) 10 MG tablet Take 1 tablet (10 mg total) by mouth at bedtime. 90 tablet 1   naproxen sodium (ANAPROX) 550 MG tablet Take 1 tablet (550 mg total) by mouth 2 (two) times daily as needed. 60 tablet 1   rosuvastatin (CRESTOR) 40 MG tablet Take 1 tablet (40 mg total) by  mouth at bedtime. 90 tablet 1   Semaglutide-Weight Management 2.4 MG/0.75ML SOAJ INJECT 2.4 MG (0.75 ML) INTO THE SKIN EVERY 7 (SEVEN) DAYS. 3 mL 3   No current facility-administered medications for this visit.    Family History  Problem Relation Age of Onset   Hypertension Mother    Hypotension Mother    Anemia Mother        low iron   Heart disease Sister    Alcohol abuse Brother    Alcohol abuse Brother    Aneurysm Brother    Drug abuse Brother    Sickle cell trait Other    Colon cancer Neg Hx    Esophageal cancer Neg Hx    Stomach cancer Neg Hx    Breast cancer Neg Hx     Review of Systems  Genitourinary:  Positive for pelvic pain (RLQ pain).  All other systems reviewed and are negative.  Exam:   BP 122/76   Pulse 84   Resp 16   Ht 5' 4"  (1.626 m)   Wt 181 lb (82.1 kg)   LMP  (LMP Unknown)   BMI 31.07 kg/m     General appearance: alert, cooperative and appears stated age Head: normocephalic, without obvious abnormality, atraumatic Neck: no adenopathy, supple, symmetrical, trachea midline and thyroid normal to inspection and palpation Lungs: clear to auscultation bilaterally Breasts: normal appearance, no masses or tenderness, No nipple retraction or dimpling, No nipple discharge or bleeding, No axillary adenopathy Heart: regular rate and rhythm Abdomen: soft, non-tender; no masses, no organomegaly Extremities: extremities normal, atraumatic, no cyanosis or edema Skin: skin color, texture, turgor normal. No rashes or lesions Lymph nodes: cervical, supraclavicular, and axillary nodes normal. Neurologic: grossly normal  Pelvic: External genitalia:  no lesions              No abnormal inguinal nodes palpated.              Urethra:  normal appearing urethra with no masses, tenderness or lesions              Bartholins and Skenes: normal                 Vagina: normal appearing vagina with normal color and discharge, no lesions              Cervix: absent               Pap taken: no Bimanual Exam:  Uterus:  absent              Adnexa: no mass, fullness, tenderness  Rectal exam: no.    Chaperone was present for exam:  Estill Bamberg, CMA  Assessment:   Well woman visit with gynecologic exam. Status post hysterectomy.  Status post BSO.  ERT. Hx VAIN I 2013.  Hx HSV 2.  Overactive bladder.  RLQ pain.  Pelvic pain.  Bereavement.   Plan: Mammogram screening discussed. Self breast awareness reviewed. Pap and HR HPV as above. Guidelines for Calcium, Vitamin D, regular exercise program including cardiovascular and weight bearing exercise. I discussed WHI and increased risk of stroke, DVT, and PE with estrogen replacement therapy.    Our goal is to wean off the estrogen. Lower Vivelle Dot to 0.05 mg twice weekly and start Vagifem 10 mcg twice weekly (instructed in use).  Refills for one year.   Urinalysis:  sg <= 1.005, pH 6.0, 0 - 5 WBC, 0 - 2 RBC, few bacteria.  UC sent.  No abx at this time.  Bereavement support given.  Follow up annually and prn.   After visit summary provided.

## 2021-02-01 ENCOUNTER — Ambulatory Visit (INDEPENDENT_AMBULATORY_CARE_PROVIDER_SITE_OTHER): Payer: No Typology Code available for payment source | Admitting: Obstetrics and Gynecology

## 2021-02-01 ENCOUNTER — Other Ambulatory Visit: Payer: Self-pay

## 2021-02-01 ENCOUNTER — Encounter: Payer: Self-pay | Admitting: Obstetrics and Gynecology

## 2021-02-01 ENCOUNTER — Other Ambulatory Visit (HOSPITAL_COMMUNITY): Payer: Self-pay

## 2021-02-01 VITALS — BP 122/76 | HR 84 | Resp 16 | Ht 64.0 in | Wt 181.0 lb

## 2021-02-01 DIAGNOSIS — Z01419 Encounter for gynecological examination (general) (routine) without abnormal findings: Secondary | ICD-10-CM | POA: Diagnosis not present

## 2021-02-01 DIAGNOSIS — R102 Pelvic and perineal pain: Secondary | ICD-10-CM | POA: Diagnosis not present

## 2021-02-01 MED ORDER — ESTRADIOL 10 MCG VA TABS
1.0000 | ORAL_TABLET | VAGINAL | 3 refills | Status: DC
Start: 2021-02-02 — End: 2022-02-07
  Filled 2021-02-01: qty 34, 84d supply, fill #0
  Filled 2021-10-24: qty 24, 84d supply, fill #1

## 2021-02-01 MED ORDER — ESTRADIOL 0.05 MG/24HR TD PTTW
1.0000 | MEDICATED_PATCH | TRANSDERMAL | 2 refills | Status: DC
Start: 2021-02-02 — End: 2022-02-07
  Filled 2021-02-01: qty 24, 84d supply, fill #0
  Filled 2021-12-19: qty 24, 84d supply, fill #1

## 2021-02-01 NOTE — Patient Instructions (Signed)

## 2021-02-03 LAB — URINALYSIS, COMPLETE W/RFL CULTURE
Bilirubin Urine: NEGATIVE
Casts: NONE SEEN /LPF
Crystals: NONE SEEN /HPF
Glucose, UA: NEGATIVE
Hgb urine dipstick: NEGATIVE
Hyaline Cast: NONE SEEN /LPF
Ketones, ur: NEGATIVE
Leukocyte Esterase: NEGATIVE
Nitrites, Initial: NEGATIVE
Protein, ur: NEGATIVE
Specific Gravity, Urine: 1.005 (ref 1.001–1.035)
Yeast: NONE SEEN /HPF
pH: 6 (ref 5.0–8.0)

## 2021-02-03 LAB — URINE CULTURE
MICRO NUMBER:: 12645012
Result:: NO GROWTH
SPECIMEN QUALITY:: ADEQUATE

## 2021-02-03 LAB — CULTURE INDICATED

## 2021-02-16 ENCOUNTER — Ambulatory Visit
Admission: RE | Admit: 2021-02-16 | Discharge: 2021-02-16 | Disposition: A | Discharge status: Home / Self Care | Payer: No Typology Code available for payment source | Source: Ambulatory Visit | Attending: Obstetrics and Gynecology | Admitting: Obstetrics and Gynecology

## 2021-02-16 ENCOUNTER — Other Ambulatory Visit: Payer: Self-pay

## 2021-02-16 DIAGNOSIS — Z1231 Encounter for screening mammogram for malignant neoplasm of breast: Secondary | ICD-10-CM

## 2021-02-17 ENCOUNTER — Other Ambulatory Visit (HOSPITAL_COMMUNITY): Payer: Self-pay

## 2021-03-07 ENCOUNTER — Other Ambulatory Visit (HOSPITAL_COMMUNITY): Payer: Self-pay

## 2021-03-08 ENCOUNTER — Other Ambulatory Visit (HOSPITAL_COMMUNITY): Payer: Self-pay

## 2021-03-09 ENCOUNTER — Other Ambulatory Visit (HOSPITAL_COMMUNITY): Payer: Self-pay

## 2021-03-10 ENCOUNTER — Other Ambulatory Visit (HOSPITAL_COMMUNITY): Payer: Self-pay

## 2021-03-24 ENCOUNTER — Other Ambulatory Visit (HOSPITAL_COMMUNITY): Payer: Self-pay

## 2021-03-24 ENCOUNTER — Encounter: Payer: Self-pay | Admitting: Family Medicine

## 2021-03-24 ENCOUNTER — Other Ambulatory Visit: Payer: Self-pay

## 2021-03-24 ENCOUNTER — Ambulatory Visit (INDEPENDENT_AMBULATORY_CARE_PROVIDER_SITE_OTHER): Payer: No Typology Code available for payment source | Admitting: Family Medicine

## 2021-03-24 VITALS — BP 114/80 | HR 91 | Ht 64.0 in | Wt 178.1 lb

## 2021-03-24 DIAGNOSIS — G4452 New daily persistent headache (NDPH): Secondary | ICD-10-CM | POA: Diagnosis not present

## 2021-03-24 DIAGNOSIS — R519 Headache, unspecified: Secondary | ICD-10-CM

## 2021-03-24 DIAGNOSIS — C719 Malignant neoplasm of brain, unspecified: Secondary | ICD-10-CM | POA: Diagnosis not present

## 2021-03-24 DIAGNOSIS — R5383 Other fatigue: Secondary | ICD-10-CM

## 2021-03-24 DIAGNOSIS — E1169 Type 2 diabetes mellitus with other specified complication: Secondary | ICD-10-CM | POA: Diagnosis not present

## 2021-03-24 DIAGNOSIS — U099 Post covid-19 condition, unspecified: Secondary | ICD-10-CM

## 2021-03-24 DIAGNOSIS — M5416 Radiculopathy, lumbar region: Secondary | ICD-10-CM

## 2021-03-24 LAB — POCT HEMOGLOBIN: Hemoglobin: 14.9 g/dL — AB (ref 11–14.6)

## 2021-03-24 MED ORDER — TRAMADOL HCL 50 MG PO TABS
50.0000 mg | ORAL_TABLET | Freq: Two times a day (BID) | ORAL | 0 refills | Status: AC | PRN
Start: 2021-03-24 — End: 2021-04-04
  Filled 2021-03-24: qty 10, 5d supply, fill #0

## 2021-03-24 NOTE — Assessment & Plan Note (Signed)
Xray or CT scan again recommended. However, she declined. She is interested in orthopedic referral. Will give her a few days of Tramadol to help with her acute exacerbation and have her return to Naproxen as instructed. Consider PT referral in the future.

## 2021-03-24 NOTE — Patient Instructions (Signed)
Chronic Migraine Headache A migraine headache is throbbing pain that is usually on one side of the head. Migraines that keep coming back are called recurring migraines. A migraine is called a chronic migraine if it happens at least 15 days in a month for more than 3 months. Talk with your doctor about what things may bring on (trigger) your migraines. What are the causes? The exact cause of this condition is not known. A migraine may be caused when nerves in the brain become irritated and release chemicals that cause irritation and swelling (inflammation) of blood vessels. The irritation and swelling of the blood vessels causes pain. Migraines may be brought on or caused by: Smoking. Foods and drinks, such as: Cheese. Chocolate. Alcohol. Caffeine. Certain substances in some foods or drinks. Some medicines. Other things that may bring on a migraine include: Periods, for women. Stress. Not enough sleep or too much sleep. Feeling very tired. Bright lights or loud noises. Smells Weather changes and being at high altitude. What increases the risk? The following factors may make you more likely to have chronic migraine: Having migraines or family members who have them. Being very sad (depressed) or feeling worried or nervous (anxious). Taking a lot of pain medicine. Having problems sleeping. Having heart disease, diabetes, or being very overweight (obese). What are the signs or symptoms? Symptoms of this condition include: Pain that feels like it throbs. Pain that is usually only on one side of the head. In some cases, the pain may be on both sides of the head or around the head or neck. Very bad pain that keeps you from doing daily activities. Pain that gets worse with activity. Feeling like you may vomit (feeling nauseous) or vomiting. Pain when you are around bright lights, loud noises, or activity. Being sensitive to bright lights, loud noises, or smells. Feeling dizzy. How is  this treated? This condition is treated with: Medicines. These help to: Lessen pain and the feeling like you may vomit. Prevent migraines. Changes to your diet or sleep. Therapy. This might include: Relaxation training. Biofeedback. This is a treatment that teaches you to relax, use your brain to lower your heart rate, and control your breathing. Cognitive behavioral therapy (CBT). This therapy helps you set goals and follow up on the changes that you make. Acupuncture. Using a device that provides electrical stimulation to your nerves, which can help take away pain. Surgery, if the other treatments do not work. Follow these instructions at home: Medicines Take over-the-counter and prescription medicines only as told by your doctor. Ask your doctor if the medicine prescribed to you requires you to avoid driving or using machinery. Lifestyle  Do not use any products that contain nicotine or tobacco, such as cigarettes, e-cigarettes, and chewing tobacco. If you need help quitting, ask your doctor. Do not drink alcohol. Get 7-9 hours of sleep each night. Lower the stress in your life. Ask your doctor about ways to do this. Stay at a healthy weight. Talk with your doctor if you need help losing weight. Get regular exercise. General instructions  Keep a journal to find out if certain things bring on migraines. For example, write down: What you eat and drink. How much sleep you get. Any change to your diet or medicines. Lie down in a dark, quiet room when you have a migraine. Try placing a cool towel over your head when you have a migraine. Keep lights dim if bright lights bother you or make your migraines worse. Keep  all follow-up visits as told by your doctor. This is important. Where to find more information Coalition for Headache and Migraine Patients (CHAMP): headachemigraine.org American Migraine Foundation: americanmigrainefoundation.org National Headache Foundation:  headaches.org Contact a doctor if: Medicine does not help your migraine. Your pain keeps coming back. Get help right away if: Your migraine becomes really bad and medicine does not help. You have a stiff neck and fever. You have trouble seeing. Your muscles are weak or you lose control of them. You lose your balance or have trouble walking. You feel like you will faint or you faint. You start having sudden, very bad headaches. You have a seizure. Summary A migraine headache is very bad, throbbing pain that is usually on one side of the head. A chronic migraine is a migraine that happens 15 days in a month for more than 3 months. Talk with your doctor about what things may bring on your migraines. Lie down in a dark, quiet room when you have a migraine. Keep a journal. This can help you find out if certain things make you have migraines. This information is not intended to replace advice given to you by your health care provider. Make sure you discuss any questions you have with your health care provider. Document Revised: 04/22/2019 Document Reviewed: 04/22/2019 Elsevier Patient Education  [place].

## 2021-03-24 NOTE — Assessment & Plan Note (Signed)
CT head ordered and scheduled.

## 2021-03-24 NOTE — Progress Notes (Signed)
SUBJECTIVE:   CHIEF COMPLAINT / HPI:   Headache  The problem occurs monthly. The problem has been gradually worsening. The pain is located in the Parietal (Headache is on top of her head, and sometimes it comes to her frontal head) region. The pain quality is not similar to prior headaches (This is different from her usual headache, it wakes her up and sometimes, she has HA all day on a daily basis). The quality of the pain is described as aching, boring, dull, sharp and throbbing. The pain is at a severity of 4/10 (Sometimes her HA severity is as high as 10/10 in severity). The pain is moderate. Associated symptoms include blurred vision. Pertinent negatives include no coughing, drainage, nausea, phonophobia, photophobia, sinus pressure, visual change or vomiting. Associated symptoms comments: ??Some runny nose. She has dizziness sometimes when she gets up.. Nothing (She does not get enough sleep at night since her husband snores) aggravates the symptoms. She has tried acetaminophen (Rest and tylenol eases her pain, but does not take it away) for the symptoms. Her past medical history is significant for migraine headaches.    Back pain: C/O low back pain not responding to Naproxen. She tried Gabapentin but didn't like it and uses Flexeril as needed with minimal improvement. Pain is worse with weight bearing and gradually worsening. Pain radiates to her right thigh. No recent injury, but she had a back injury at work many years ago. Over the past weekend, she used Ice and a heating pad with a lidocaine patch for her pain, but also no improvement.  Fatigue: C/O persistent fatigue since July 2022 after her COVID 19 infection. No change in symptoms since then. She has not been getting adequate rest at night due to her husband's heavy snoring. She feels this is contributing to her headache and fatigue as well.   PERTINENT  PMH / PSH: PMHx reviewed  OBJECTIVE:   BP 114/80    Pulse 91    Ht 5\' 4"   (1.626 m)    Wt 178 lb 2 oz (80.8 kg)    LMP  (LMP Unknown)    SpO2 98%    BMI 30.58 kg/m   Physical Exam Vitals and nursing note reviewed.  Constitutional:      General: She is not in acute distress.    Appearance: She is not toxic-appearing.  HENT:     Head: Normocephalic and atraumatic.  Cardiovascular:     Rate and Rhythm: Normal rate and regular rhythm.     Heart sounds: Normal heart sounds. No murmur heard. Pulmonary:     Effort: Pulmonary effort is normal. No respiratory distress.     Breath sounds: Normal breath sounds. No wheezing.  Musculoskeletal:        General: No swelling.     Lumbar back: Tenderness present. No deformity. Decreased range of motion.     Right hip: Normal range of motion.     Left hip: Normal range of motion.     Comments: Sensory exam of the foot is normal, tested with the monofilament. Good pulses, no lesions or ulcers, good peripheral pulses.   Neurological:     Mental Status: She is alert and oriented to person, place, and time.     Cranial Nerves: No cranial nerve deficit or facial asymmetry.     Sensory: No sensory deficit.     Motor: No weakness.     Deep Tendon Reflexes: Reflexes normal. Babinski sign absent on the right side. Babinski  sign absent on the left side.     ASSESSMENT/PLAN:   New daily persistent headache Different from her typical Migraine headache.  No neurologic deficit on her examination. 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 Tylenol/Naproxen as needed for pain. ED precaution discussed.  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  Persistent fatigue after COVID-19 POC hemoglobin done today not suggestive of anemia. 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 Recent labs since her COVID-19 reviewed and were appropriate. Continue appropriate diet and graded exercise. Monitor closely for now.  Lumbar radiculopathy Xray or CT scan again recommended. However, she  declined. She is interested in orthopedic referral. Will give her a few days of Tramadol to help with her acute exacerbation and have her return to Naproxen as instructed. Consider PT referral in the future.      Andrena Mews, MD [place]

## 2021-03-24 NOTE — Assessment & Plan Note (Signed)
POC hemoglobin done today not suggestive of anemia. 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 Recent labs since her COVID-19 reviewed and were appropriate. Continue appropriate diet and graded exercise. Monitor closely for now.

## 2021-03-24 NOTE — Assessment & Plan Note (Signed)
Different from her typical Migraine headache.  No neurologic deficit on her examination. 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 Tylenol/Naproxen as needed for pain. ED precaution discussed.

## 2021-03-31 ENCOUNTER — Encounter: Payer: Self-pay | Admitting: Family Medicine

## 2021-03-31 ENCOUNTER — Ambulatory Visit (HOSPITAL_COMMUNITY)
Admission: RE | Admit: 2021-03-31 | Discharge: 2021-03-31 | Disposition: A | Discharge status: Home / Self Care | Payer: No Typology Code available for payment source | Source: Ambulatory Visit | Attending: Family Medicine | Admitting: Family Medicine

## 2021-03-31 DIAGNOSIS — R519 Headache, unspecified: Secondary | ICD-10-CM | POA: Insufficient documentation

## 2021-04-04 ENCOUNTER — Other Ambulatory Visit: Payer: Self-pay | Admitting: Orthopedic Surgery

## 2021-04-04 DIAGNOSIS — M533 Sacrococcygeal disorders, not elsewhere classified: Secondary | ICD-10-CM

## 2021-04-06 ENCOUNTER — Other Ambulatory Visit (HOSPITAL_COMMUNITY): Payer: Self-pay

## 2021-04-07 ENCOUNTER — Other Ambulatory Visit: Payer: Self-pay | Admitting: Orthopedic Surgery

## 2021-04-07 DIAGNOSIS — M545 Low back pain, unspecified: Secondary | ICD-10-CM

## 2021-04-19 ENCOUNTER — Other Ambulatory Visit (HOSPITAL_COMMUNITY): Payer: Self-pay

## 2021-04-19 ENCOUNTER — Encounter: Payer: Self-pay | Admitting: Family Medicine

## 2021-04-19 ENCOUNTER — Ambulatory Visit (INDEPENDENT_AMBULATORY_CARE_PROVIDER_SITE_OTHER): Payer: No Typology Code available for payment source | Admitting: Family Medicine

## 2021-04-19 ENCOUNTER — Other Ambulatory Visit: Payer: Self-pay

## 2021-04-19 DIAGNOSIS — M5416 Radiculopathy, lumbar region: Secondary | ICD-10-CM

## 2021-04-19 DIAGNOSIS — I10 Essential (primary) hypertension: Secondary | ICD-10-CM

## 2021-04-19 DIAGNOSIS — G4730 Sleep apnea, unspecified: Secondary | ICD-10-CM

## 2021-04-19 DIAGNOSIS — G43809 Other migraine, not intractable, without status migrainosus: Secondary | ICD-10-CM | POA: Diagnosis not present

## 2021-04-19 MED ORDER — PROPRANOLOL HCL 20 MG PO TABS
20.0000 mg | ORAL_TABLET | Freq: Two times a day (BID) | ORAL | 1 refills | Status: DC
  Filled 2021-04-19: qty 60, 30d supply, fill #0
  Filled 2021-07-10: qty 60, 30d supply, fill #1

## 2021-04-19 NOTE — Assessment & Plan Note (Signed)
D/C Losartan while on Propranolol. Monitor BP closely at home. F/U in 4 weeks for BP reassessment.

## 2021-04-19 NOTE — Assessment & Plan Note (Signed)
??   Causing worsening of her HA. Referral to sleep clinic for sleep study placed.

## 2021-04-19 NOTE — Assessment & Plan Note (Signed)
Persistent headache. CT head reviewed and discussed with her. She is on Estradiol and Wegovy which may both cause headache or worsening of her Migraine. She is unwilling to get off these two medications. Trial of Propranolol 20 mg BID discussed and she wants to try this for her HA. D/C Losartan for HTN. F/U soon if there is no improvement.

## 2021-04-19 NOTE — Assessment & Plan Note (Signed)
Likely DJD. MRI spine pending. F/U ortho as planned.

## 2021-04-19 NOTE — Progress Notes (Signed)
° ° °  SUBJECTIVE:   CHIEF COMPLAINT / HPI:   Headache: She continues to have daily headaches. Her pain is about 6/10 in severity today. She is uncertain if this is typical of her Migraine headache. No vision loss, no other symptoms. She tried Magnesium oxide and Topamax in the past without any benefit. Hence, she prefers not o try these medications. A headache specialist also saw her in the past, and botox treatment was offered, but she declined at the time.  Sleep Apnea She has issues with sleeping at night and sometimes snores. Hx of sleep apnea in the past. She is uncertain if this contributes to her HA.  Low back pain: She was seen by orthopedic and they recommended further evaluation with an MRI which has been scheduled. No new concerns.   PERTINENT  PMH / PSH: PMHx reviewed.  OBJECTIVE:   BP 121/83    Pulse 86    Ht 5\' 4"  (1.626 m)    Wt 173 lb 12.8 oz (78.8 kg)    LMP  (LMP Unknown)    SpO2 99%    BMI 29.83 kg/m   Physical Exam Vitals and nursing note reviewed.  Cardiovascular:     Rate and Rhythm: Normal rate and regular rhythm.     Heart sounds: Normal heart sounds. No murmur heard. Pulmonary:     Effort: Pulmonary effort is normal. No respiratory distress.     Breath sounds: Normal breath sounds. No wheezing.  Neurological:     General: No focal deficit present.     Cranial Nerves: Cranial nerves 2-12 are intact.     Sensory: Sensation is intact.     Coordination: Coordination is intact.     Deep Tendon Reflexes: Reflexes are normal and symmetric.     ASSESSMENT/PLAN:   Migraine headache Persistent headache. CT head reviewed and discussed with her. She is on Estradiol and Wegovy which may both cause headache or worsening of her Migraine. She is unwilling to get off these two medications. Trial of Propranolol 20 mg BID discussed and she wants to try this for her HA. D/C Losartan for HTN. F/U soon if there is no improvement.  Essential hypertension D/C Losartan  while on Propranolol. Monitor BP closely at home. F/U in 4 weeks for BP reassessment.   Sleep apnea ?? Causing worsening of her HA. Referral to sleep clinic for sleep study placed.  Lumbar radiculopathy Likely DJD. MRI spine pending. F/U ortho as planned.    Sharon Mews, MD [place]

## 2021-04-19 NOTE — Patient Instructions (Signed)
Migraine Headache A migraine headache is a very strong throbbing pain on one side or both sides of your head. This type of headache can also cause other symptoms. It can last from 4 hours to 3 days. Talk with your doctor about what things may bring on (trigger) this condition. What are the causes? The exact cause of this condition is not known. This condition may be triggered or caused by: Drinking alcohol. Smoking. Taking medicines, such as: Medicine used to treat chest pain (nitroglycerin). Birth control pills. Estrogen. Some blood pressure medicines. Eating or drinking certain products. Doing physical activity. Other things that may trigger a migraine headache include: Having a menstrual period. Pregnancy. Hunger. Stress. Not getting enough sleep or getting too much sleep. Weather changes. Tiredness (fatigue). What increases the risk? Being 25-55 years old. Being female. Having a family history of migraine headaches. Being Caucasian. Having depression or anxiety. Being very overweight. What are the signs or symptoms? A throbbing pain. This pain may: Happen in any area of the head, such as on one side or both sides. Make it hard to do daily activities. Get worse with physical activity. Get worse around bright lights or loud noises. Other symptoms may include: Feeling sick to your stomach (nauseous). Vomiting. Dizziness. Being sensitive to bright lights, loud noises, or smells. Before you get a migraine headache, you may get warning signs (an aura). An aura may include: Seeing flashing lights or having blind spots. Seeing bright spots, halos, or zigzag lines. Having tunnel vision or blurred vision. Having numbness or a tingling feeling. Having trouble talking. Having weak muscles. Some people have symptoms after a migraine headache (postdromal phase), such as: Tiredness. Trouble thinking (concentrating). How is this treated? Taking medicines that: Relieve  pain. Relieve the feeling of being sick to your stomach. Prevent migraine headaches. Treatment may also include: Having acupuncture. Avoiding foods that bring on migraine headaches. Learning ways to control your body functions (biofeedback). Therapy to help you know and deal with negative thoughts (cognitive behavioral therapy). Follow these instructions at home: Medicines Take over-the-counter and prescription medicines only as told by your doctor. Ask your doctor if the medicine prescribed to you: Requires you to avoid driving or using heavy machinery. Can cause trouble pooping (constipation). You may need to take these steps to prevent or treat trouble pooping: Drink enough fluid to keep your pee (urine) pale yellow. Take over-the-counter or prescription medicines. Eat foods that are high in fiber. These include beans, whole grains, and fresh fruits and vegetables. Limit foods that are high in fat and sugar. These include fried or sweet foods. Lifestyle Do not drink alcohol. Do not use any products that contain nicotine or tobacco, such as cigarettes, e-cigarettes, and chewing tobacco. If you need help quitting, ask your doctor. Get at least 8 hours of sleep every night. Limit and deal with stress. General instructions   Keep a journal to find out what may bring on your migraine headaches. For example, write down: What you eat and drink. How much sleep you get. Any change in what you eat or drink. Any change in your medicines. If you have a migraine headache: Avoid things that make your symptoms worse, such as bright lights. It may help to lie down in a dark, quiet room. Do not drive or use heavy machinery. Ask your doctor what activities are safe for you. Keep all follow-up visits as told by your doctor. This is important. Contact a doctor if: You get a migraine headache that   is different or worse than others you have had. You have more than 15 headache days in one  month. Get help right away if: Your migraine headache gets very bad. Your migraine headache lasts longer than 72 hours. You have a fever. You have a stiff neck. You have trouble seeing. Your muscles feel weak or like you cannot control them. You start to lose your balance a lot. You start to have trouble walking. You pass out (faint). You have a seizure. Summary A migraine headache is a very strong throbbing pain on one side or both sides of your head. These headaches can also cause other symptoms. This condition may be treated with medicines and changes to your lifestyle. Keep a journal to find out what may bring on your migraine headaches. Contact a doctor if you get a migraine headache that is different or worse than others you have had. Contact your doctor if you have more than 15 headache days in a month. This information is not intended to replace advice given to you by your health care provider. Make sure you discuss any questions you have with your health care provider. Document Revised: 06/27/2018 Document Reviewed: 04/17/2018 Elsevier Patient Education  2022 Elsevier Inc.  

## 2021-04-28 ENCOUNTER — Other Ambulatory Visit: Payer: No Typology Code available for payment source

## 2021-05-03 ENCOUNTER — Other Ambulatory Visit: Payer: No Typology Code available for payment source

## 2021-05-03 ENCOUNTER — Ambulatory Visit (INDEPENDENT_AMBULATORY_CARE_PROVIDER_SITE_OTHER): Payer: No Typology Code available for payment source | Admitting: Family Medicine

## 2021-05-03 ENCOUNTER — Other Ambulatory Visit: Payer: Self-pay

## 2021-05-03 VITALS — BP 128/85 | HR 87 | Ht 64.0 in | Wt 171.4 lb

## 2021-05-03 DIAGNOSIS — J069 Acute upper respiratory infection, unspecified: Secondary | ICD-10-CM

## 2021-05-03 DIAGNOSIS — R059 Cough, unspecified: Secondary | ICD-10-CM

## 2021-05-03 LAB — POCT INFLUENZA A/B
Influenza A, POC: NEGATIVE
Influenza B, POC: NEGATIVE

## 2021-05-03 NOTE — Progress Notes (Signed)
° ° ° °  SUBJECTIVE:   CHIEF COMPLAINT / HPI:   Magdeline Prange is a 61 y.o. female presents for cough   Primary symptom: sore throat, cough, Duration: 2 days ago  Associated symptoms:body aches, headaches  Fever? Tmax?: none, 99.1 Sick contacts: grand niece was recently sick with headache and sore throat. Attended 2 funerals in the the last week.  Covid test: Home test negative, PCR test with health at work negative Covid vaccination(s): x4 Has had flu vaccine.   [facility] Office Visit from 04/19/2021 in [place]  PHQ-9 Total Score 3        PERTINENT  PMH / PSH: HTN, migraine   OBJECTIVE:   BP 128/85    Pulse 87    Ht 5\' 4"  (1.626 m)    Wt 171 lb 6.4 oz (77.7 kg)    LMP  (LMP Unknown)    SpO2 99%    BMI 29.42 kg/m    General: Alert, no acute distress HEENT: NCAT, no pharyngeal edema, erythema, normal Tms bilaterally, no cervical lymphadenopathy Cardio: Normal S1 and S2, RRR, no r/m/g Pulm: CTAB, normal work of breathing Extremities: No peripheral edema.  Neuro: Cranial nerves grossly intact   ASSESSMENT/PLAN:   URI (upper respiratory infection) Likely viral URI.  Normal physical exam and vital signs which is reassuring.  Low suspicion for COVID given patient has been afebrile, mild symptoms and 2 times negative tests over the last few days.  Offered patient rapid flu test which was negative. Supportive management recommended to pt i.e. rest, fluids, Tylenol, over-the-counter remedies etc. Follow-up as needed.  Strict ER precautions given.    Lattie Haw, MD PGY-3 [place]

## 2021-05-03 NOTE — Patient Instructions (Signed)
Thank you for coming to see me today. It was a pleasure. Today we discussed your symptoms. Sounds like you have an upper respiratory infection. I recommend fluids, rest, tylenol, motrin (sparingly), honey and over the counter cough remedies.  Have checked you for flu today, results will be on my chart. Will be in touch if positive.  If worsening symptoms go to the ER  Please follow-up with PCP as needed   If you have any questions or concerns, please do not hesitate to call the office at [phone].  Best wishes,   Dr Posey Pronto

## 2021-05-05 ENCOUNTER — Other Ambulatory Visit: Payer: Self-pay | Admitting: Family Medicine

## 2021-05-05 ENCOUNTER — Other Ambulatory Visit (HOSPITAL_COMMUNITY): Payer: Self-pay

## 2021-05-05 DIAGNOSIS — J069 Acute upper respiratory infection, unspecified: Secondary | ICD-10-CM | POA: Insufficient documentation

## 2021-05-05 DIAGNOSIS — R0981 Nasal congestion: Secondary | ICD-10-CM

## 2021-05-05 MED ORDER — MONTELUKAST SODIUM 10 MG PO TABS
10.0000 mg | ORAL_TABLET | Freq: Every day | ORAL | 1 refills | Status: DC
Start: 2021-05-05 — End: 2022-01-19
  Filled 2021-05-05: qty 90, 90d supply, fill #0
  Filled 2021-08-29: qty 90, 90d supply, fill #1

## 2021-05-05 NOTE — Assessment & Plan Note (Addendum)
Likely viral URI.  Normal physical exam and vital signs which is reassuring.  Low suspicion for COVID given patient has been afebrile, mild symptoms and 2 times negative tests over the last few days.  Offered patient rapid flu test which was negative. Supportive management recommended to pt i.e. rest, fluids, Tylenol, over-the-counter remedies etc. Follow-up as needed.  Strict ER precautions given.

## 2021-05-10 ENCOUNTER — Institutional Professional Consult (permissible substitution): Payer: No Typology Code available for payment source | Admitting: Pulmonary Disease

## 2021-05-17 ENCOUNTER — Ambulatory Visit (INDEPENDENT_AMBULATORY_CARE_PROVIDER_SITE_OTHER): Payer: No Typology Code available for payment source | Admitting: Student

## 2021-05-17 ENCOUNTER — Encounter: Payer: Self-pay | Admitting: Student

## 2021-05-17 ENCOUNTER — Other Ambulatory Visit: Payer: Self-pay

## 2021-05-17 ENCOUNTER — Other Ambulatory Visit (HOSPITAL_COMMUNITY): Payer: Self-pay

## 2021-05-17 DIAGNOSIS — J309 Allergic rhinitis, unspecified: Secondary | ICD-10-CM | POA: Diagnosis not present

## 2021-05-17 MED ORDER — FLUTICASONE PROPIONATE 50 MCG/ACT NA SUSP
2.0000 | Freq: Every day | NASAL | 12 refills | Status: DC
  Filled 2021-05-17: qty 16, 30d supply, fill #0
  Filled 2021-09-25: qty 16, 30d supply, fill #1
  Filled 2021-12-19: qty 16, 30d supply, fill #2
  Filled 2022-04-10: qty 16, 30d supply, fill #3

## 2021-05-17 MED ORDER — CETIRIZINE HCL 10 MG PO TABS
10.0000 mg | ORAL_TABLET | Freq: Every day | ORAL | 0 refills | Status: DC
  Filled 2021-05-17: qty 30, 30d supply, fill #0

## 2021-05-17 NOTE — Progress Notes (Addendum)
zy ?SUBJECTIVE:  ? ?CHIEF COMPLAINT / HPI:  ? ?Concern for congestion and sinus issues ?Had recent visit for cough and sore throat on 05/03/21 and was COVID/Flu negative ?Continues to have cough and sore throat. Also has headache and a lot of mucous coming out, coughing up flem. No fevers. No nausea, vomiting, or diarrhea, no appetite, feels run down and a little achey/sore when she gets up. Complains of pain over front of head and around nose. Taking Cingulair daily.  ? ?Concerned for sinus infection today. ? ?PERTINENT  PMH / PSH:  ? ?Past Medical History:  ?Diagnosis Date  ? Anxiety   ? Arthritis   ? neck , right knee > than left  ? Brain tumor (benign) ([place]) 2018  ? Chest pain 05/15/2015  ? Complication of anesthesia   ? Depression   ? in the past  ? Diabetes mellitus without complication ([place])   ? Type II  ? GERD (gastroesophageal reflux disease)   ? 04/30/2019- "in the past"  ? H/O varicella   ? History of measles, mumps, or rubella   ? HSV-2 (herpes simplex virus 2) infection   ? Hx of ovarian cyst   ? Hyperlipidemia   ? Hypersomnia 07/19/2015  ? Hypertension   ? "sometimes"   ? Migraine headache   ? "due to cervical issuses"  ? Plantar fasciitis   ? PONV (postoperative nausea and vomiting)   ? Scapular dyskinesis 10/03/2016  ? Sleep apnea   ? pt denies  ? Sleep deprivation   ? loss of sleep  ? VAIN I (vaginal intraepithelial neoplasia grade I) 2013  ? pap and confirmed by colposcopic biopsy  ? ? ?Past Surgical History:  ?Procedure Laterality Date  ? ABDOMINAL HYSTERECTOMY    ? ANTERIOR CERVICAL DECOMP/DISCECTOMY FUSION N/A 05/07/2019  ? Procedure: ANTERIOR CERVICAL DECOMPRESSION FUSION CERVICAL 5-6, CERVICAL 6-7 WITH INSTRUMENTATION AND ALLOGRAFT;  Surgeon: Phylliss Bob, MD;  Location: [facility];  Service: Orthopedics;  Laterality: N/A;  ? CHOLECYSTECTOMY  11/13/10  ? COLONOSCOPY  Multiple  ? DILATION AND CURETTAGE OF UTERUS    ? ESOPHAGOGASTRODUODENOSCOPY    ? OOPHORECTOMY  2009  ? bilateral salpingo-oophorectomy.  ?  SHOULDER ARTHROSCOPY WITH ROTATOR CUFF REPAIR Right 08/03/2019  ? Procedure: SHOULDER ARTHROSCOPY WITH. ROTATOR CUFF DEBRIDEMENT, SUBACROMIAL DECOMPRESSION, DISTAL CLAVICLE EXCISION;  Surgeon: Tania Ade, MD;  Location: [facility];  Service: Orthopedics;  Laterality: Right;  ? TUBAL LIGATION    ? ? ?OBJECTIVE:  ?BP 128/80   Pulse 97   Ht 5\' 4"  (1.626 m)   Wt 75.4 kg   LMP  (LMP Unknown)   SpO2 99%   BMI 28.53 kg/m?  ? ?General: NAD, pleasant, able to participate in exam ?Cardiac: RRR, no murmurs auscultated. ?Respiratory: CTAB, normal effort, no wheezes, rales or rhonchi ?[place]/sinus: No pain over fronto-temporo sinus/ or cheek sinus, mild pressure.  ?Oral/Neck: Throat clear, pink, with no swelling, clear. Neck soft with no lymphadenopathy ?Abdomen: soft, non-tender, non-distended, normoactive bowel sounds ?Extremities: warm and well perfused, no edema or cyanosis. ?Skin: warm and dry, no rashes noted ?Neuro: alert, no obvious focal deficits, speech normal ?Psych: Normal affect and mood ? ?ASSESSMENT/PLAN:  ?Allergic rhinitis ?Patient presents with 2 weeks of runny nose, sore throat, headache, and cough, with no fevers or other infectious symptoms (n/v/d/malaise/body aches). Patient has tried mucinex. Was seen 2 weeks ago in clinic after negative at home COVID and Flu test in clinic. Symptoms sound allergic or could be sinusitis. Sinusitis algorithm  reviewed with patient, determined that she may have bacterial sinusitis, but less likely. We will start allergy medicine and consider abx if symptoms persist past 7-10 days ?-Zyrtec 10 mg daily ?-Fluticasone 50 mcg 2 puff each nares daily ?-Message provider through MyChart if symptoms persist past 7-10 days, for Antibiotics ?  ?No orders of the defined types were placed in this encounter. ? ?Meds ordered this encounter  ?Medications  ? cetirizine (ZYRTEC ALLERGY) 10 MG tablet  ?  Sig: Take 1 tablet (10 mg total) by mouth daily.  ?  Dispense:   30 tablet  ?  Refill:  0  ? fluticasone (FLONASE) 50 MCG/ACT nasal spray  ?  Sig: Place 1-2 sprays into both nostrils daily.  ?  Dispense:  16 g  ?  Refill:  12  ? ?No follow-ups on file. ?@[place] @ ? ?

## 2021-05-17 NOTE — Patient Instructions (Signed)
It was great to see you! Thank you for allowing me to participate in your care! ? ?Your symptoms are concerning for allergies or a sinus infection. We'd like to start some allergy medicine to target this, and can do more if symptoms persist for 7-10 more days. These allergy meds are better suited for allergy treatment than cingulair alone.  ? ?Our plans for today:  ?- Start allergy meds:   ?      Zyrtec 10 mg daily, Fluticasone nasal spray 2 puff each nares, daily ?      If these medicines work, you can purchase them over the counter at any drug store, if your copay is to high. ?- IF symptoms persist after 7-10 days, please message me or your provider on MyChart, and we will send in an antibiotic prescription for bacterial sinusitis concerns ? ? ?Take care and seek immediate care sooner if you develop any concerns.  ? ?Dr. Holley Bouche, MD ?[place] ? ?

## 2021-05-17 NOTE — Assessment & Plan Note (Signed)
Patient presents with 2 weeks of runny nose, sore throat, headache, and cough, with no fevers or other infectious symptoms (n/v/d/malaise/body aches). Patient has tried mucinex. Was seen 2 weeks ago in clinic after negative at home COVID and Flu test in clinic. Symptoms sound allergic or could be sinusitis. Sinusitis algorithm reviewed with patient, determined that she may have bacterial sinusitis, but less likely. We will start allergy medicine and consider abx if symptoms persist past 7-10 days ?-Zyrtec 10 mg daily ?-Fluticasone 50 mcg 2 puff each nares daily ?-Message provider through MyChart if symptoms persist past 7-10 days, for Antibiotics ?

## 2021-05-23 ENCOUNTER — Ambulatory Visit (INDEPENDENT_AMBULATORY_CARE_PROVIDER_SITE_OTHER): Payer: No Typology Code available for payment source | Admitting: Family Medicine

## 2021-05-23 ENCOUNTER — Other Ambulatory Visit (HOSPITAL_COMMUNITY): Payer: Self-pay

## 2021-05-23 ENCOUNTER — Encounter: Payer: Self-pay | Admitting: Family Medicine

## 2021-05-23 ENCOUNTER — Other Ambulatory Visit: Payer: Self-pay

## 2021-05-23 VITALS — BP 124/70 | HR 70 | Temp 98.4°F | Wt 169.8 lb

## 2021-05-23 DIAGNOSIS — R059 Cough, unspecified: Secondary | ICD-10-CM

## 2021-05-23 MED ORDER — ACETAMINOPHEN-CODEINE #3 300-30 MG PO TABS
1.0000 | ORAL_TABLET | Freq: Three times a day (TID) | ORAL | 0 refills | Status: DC | PRN
  Filled 2021-05-23: qty 20, 7d supply, fill #0

## 2021-05-23 NOTE — Patient Instructions (Addendum)
It was great seeing you today! ? ?You came in for cough and nosebleed.  I am sending a prescription for Tylenol with codeine to take every 8 hours as needed for cough given you have tried many other remedies without relief.  This can cause drowsiness so it is best to use it at night.  We also discussed doing imaging of your lungs, but you would like to try and see if your cough can be controlled with this short course of medication first.  Please let us know if this does not help. ? ?Feel free to call with any questions or concerns at any time, at [phone]. ?  ?Take care,  ?Dr. Shary Key ?[place]  ?

## 2021-05-23 NOTE — Progress Notes (Signed)
? ? ?  SUBJECTIVE:  ? ?CHIEF COMPLAINT / HPI:  ? ?[name] is a 61 yo who presents for cough and nosebleeds. ? ?She was seen on 3/1 for cough and sore throat, after a visit prior on 2/15 where she was COVID/flu negative.  She was advised to take Zyrtec and fluticasone daily, but to return if symptoms persist past 7 to 10 days. ? ?Today she states she has one nosebleed today from her right nostril. Was thinking her BP was elevated. Also with runny nose since COVID last summer. Also with productive cough Coughing to the point of throwing up. Denies fevers. Does have mucus production. Worse during the evenings, starting at about 4pm. Has been taking Flonase and zyrtec daily and not sure if its been helpful. Mucinex DM, Tessalon perles have not helped. She did state codeine helped for her.  ? ?No history of any lung disease  ? ? ?PERTINENT  PMH / PSH:  ?Allergic rhinitis, ? ?OBJECTIVE:  ? ?BP 124/70   Pulse 70   Temp 98.4 ?F (36.9 ?C)   Wt 169 lb 12.8 oz (77 kg)   LMP  (LMP Unknown)   SpO2 98%   BMI 29.15 kg/m?   ? ?Physical exam ?General: well appearing, NAD ?HEENT: MMM. Oropharynx non erythematous and without exudates  ?Cardiovascular: RRR, no murmurs ?Lungs: CTAB. Normal WOB ?Abdomen: soft, non-distended, non-tender ?Skin: warm, dry. No edema ? ?ASSESSMENT/PLAN:  ? ?No problem-specific Assessment & Plan notes found for this encounter. ?  ?Cough ?Patient presents with cough for the past month not relieved with OTC cough medication, Flonase, Tessalon perles.  Physical exam unremarkable. Likely due to post viral cough vs  post nasal drip. Allergies also likely contributing with likely caused her nosebleed today. Prescribed short course of Tylenol #3 and continuing Flonase. Offered chest CT even history of pulmonary nodule as well but she declined at this time. Gave return precautions and follow up as needed.  ? ?Sharon Key, DO ?[place]   ?

## 2021-06-06 ENCOUNTER — Other Ambulatory Visit (HOSPITAL_COMMUNITY): Payer: Self-pay

## 2021-06-07 ENCOUNTER — Other Ambulatory Visit (HOSPITAL_COMMUNITY): Payer: Self-pay

## 2021-06-09 ENCOUNTER — Institutional Professional Consult (permissible substitution): Payer: No Typology Code available for payment source | Admitting: Adult Health

## 2021-06-22 LAB — HM DIABETES EYE EXAM

## 2021-07-05 ENCOUNTER — Encounter: Payer: Self-pay | Admitting: Family Medicine

## 2021-07-10 ENCOUNTER — Other Ambulatory Visit (HOSPITAL_COMMUNITY): Payer: Self-pay

## 2021-07-17 ENCOUNTER — Other Ambulatory Visit: Payer: Self-pay | Admitting: Family Medicine

## 2021-07-17 ENCOUNTER — Telehealth: Payer: Self-pay | Admitting: Family Medicine

## 2021-07-17 ENCOUNTER — Other Ambulatory Visit (HOSPITAL_COMMUNITY): Payer: Self-pay

## 2021-07-17 MED ORDER — SEMAGLUTIDE-WEIGHT MANAGEMENT 2.4 MG/0.75ML ~~LOC~~ SOAJ
2.4000 mg | SUBCUTANEOUS | 3 refills | Status: DC
  Filled 2021-07-17: qty 3, 28d supply, fill #0
  Filled 2021-08-10: qty 3, 28d supply, fill #1
  Filled 2021-09-25: qty 3, 28d supply, fill #2
  Filled 2021-10-24: qty 3, 28d supply, fill #3

## 2021-07-17 NOTE — Telephone Encounter (Signed)
Hello [employer] C[employer], ? ?Please contact patient regarding her Wegovy refill. SHe has been off this medication for more than 6 months, hence, she need to restart at a lower dose. Please, help her schedule an appointment with me soon to discuss. Thanks. ?

## 2021-07-17 NOTE — Telephone Encounter (Signed)
Patient calls nurse line returning phone call.  ? ?Patient reports she has been taking this medication continually since December/January.  ? ?I scheduled patient an apt for 5/23 with PCP to discuss. However, patient is requesting refill prior to apt.  ? ?Will forward to PCP.  ?

## 2021-07-18 ENCOUNTER — Other Ambulatory Visit (HOSPITAL_COMMUNITY): Payer: Self-pay

## 2021-07-19 NOTE — Telephone Encounter (Signed)
Spoke with patient. She did confirm that she is taking the 2.'4mg'$  dose. I also reminded her of her appointment on 5/23 at 8:30 with Dr. Gwendlyn Deutscher. Salvatore Marvel, CMA ? ?

## 2021-08-07 ENCOUNTER — Encounter: Payer: Self-pay | Admitting: Family Medicine

## 2021-08-08 ENCOUNTER — Other Ambulatory Visit (HOSPITAL_COMMUNITY): Payer: Self-pay

## 2021-08-08 ENCOUNTER — Encounter: Payer: Self-pay | Admitting: Family Medicine

## 2021-08-08 ENCOUNTER — Ambulatory Visit (INDEPENDENT_AMBULATORY_CARE_PROVIDER_SITE_OTHER): Payer: No Typology Code available for payment source | Admitting: Family Medicine

## 2021-08-08 VITALS — BP 123/82 | HR 69 | Ht 64.0 in | Wt 166.8 lb

## 2021-08-08 DIAGNOSIS — E118 Type 2 diabetes mellitus with unspecified complications: Secondary | ICD-10-CM | POA: Diagnosis not present

## 2021-08-08 DIAGNOSIS — E785 Hyperlipidemia, unspecified: Secondary | ICD-10-CM | POA: Diagnosis not present

## 2021-08-08 DIAGNOSIS — J309 Allergic rhinitis, unspecified: Secondary | ICD-10-CM

## 2021-08-08 DIAGNOSIS — E1169 Type 2 diabetes mellitus with other specified complication: Secondary | ICD-10-CM

## 2021-08-08 DIAGNOSIS — I10 Essential (primary) hypertension: Secondary | ICD-10-CM

## 2021-08-08 DIAGNOSIS — Z7689 Persons encountering health services in other specified circumstances: Secondary | ICD-10-CM

## 2021-08-08 DIAGNOSIS — Z87891 Personal history of nicotine dependence: Secondary | ICD-10-CM

## 2021-08-08 MED ORDER — LEVOCETIRIZINE DIHYDROCHLORIDE 5 MG PO TABS
5.0000 mg | ORAL_TABLET | Freq: Every evening | ORAL | 1 refills | Status: DC
  Filled 2021-08-08: qty 90, 90d supply, fill #0
  Filled 2021-11-24: qty 90, 90d supply, fill #1

## 2021-08-08 NOTE — Addendum Note (Signed)
Addended by: Andrena Mews T on: 08/08/2021 11:53 AM   Modules accepted: Orders

## 2021-08-08 NOTE — Assessment & Plan Note (Signed)
She does not qualify for lung cancer screen.

## 2021-08-08 NOTE — Assessment & Plan Note (Signed)
Currently on Semaglutide A1C ordered. I will contact her soon with her results. Cmet and FLP also ordered. No medication adjustment at this time.

## 2021-08-08 NOTE — Assessment & Plan Note (Signed)
BP looks good  

## 2021-08-08 NOTE — Assessment & Plan Note (Signed)
FLP checked today.

## 2021-08-08 NOTE — Progress Notes (Addendum)
    SUBJECTIVE:   CHIEF COMPLAINT / HPI:   HTN/HLD/DM2/Weight management: She is compliant with Wegovy at 2.4 mg weekly. She denies any concerns. She is excited about her weight loss. She is compliant with her Crestor 40 mg and Zetia 10 mg for HLD and on propranolol for HA and HTN.  Allergies: C/O persistent watery runny nose and itchy eyes despite Zyrtec and Singular.   Hx of Smoking:   She smoked 1/2 pack per day for less than a week when she was 61 yrs old.  Cataract: She is scheduled for cataract surgery on June 6 with Dr. Katy Apo at [facility]  PMH / [place]: PMHx reviewed  OBJECTIVE:   BP 123/82   Pulse 69   Ht '5\' 4"'$  (1.626 m)   Wt 166 lb 12.8 oz (75.7 kg)   LMP  (LMP Unknown)   SpO2 98%   BMI 28.63 kg/m    Physical Exam Vitals and nursing note reviewed.  Cardiovascular:     Rate and Rhythm: Normal rate and regular rhythm.     Heart sounds: Normal heart sounds. No murmur heard. Pulmonary:     Effort: Pulmonary effort is normal. No respiratory distress.     Breath sounds: Normal breath sounds.     ASSESSMENT/PLAN:   DM2 (diabetes mellitus, type 2) (HCC) Currently on Semaglutide A1C ordered. I will contact her soon with her results. Cmet and FLP also ordered. No medication adjustment at this time.   Hyperlipidemia FLP checked today.  Essential hypertension BP looks good.  Encounter for weight management BMI down to Body mass index is 28.63 kg/m. She started on Wegovy at a weight of 210 and is now down to 166 lbs. She is excited about this. Continue Wegovy 2.4 mg for now. Discuss weaning off in the future.   History of tobacco use She does not qualify for lung cancer screen.   Allergic Rhinitis: Switch Zyrtec to Xyzal. Continue Singulair and Flonase.  Cataract: F/U with Ophthalmologist for surgery as planned.  Andrena Mews, MD [place]

## 2021-08-08 NOTE — Patient Instructions (Addendum)
Calorie Counting for Weight Loss Calories are units of energy. Your body needs a certain number of calories from food to keep going throughout the day. When you eat or drink more calories than your body needs, your body stores the extra calories mostly as fat. When you eat or drink fewer calories than your body needs, your body burns fat to get the energy it needs. Calorie counting means keeping track of how many calories you eat and drink each day. Calorie counting can be helpful if you need to lose weight. If you eat fewer calories than your body needs, you should lose weight. Ask your health care provider what a healthy weight is for you. For calorie counting to work, you will need to eat the right number of calories each day to lose a healthy amount of weight per week. A dietitian can help you figure out how many calories you need in a day and will suggest ways to reach your calorie goal. A healthy amount of weight to lose each week is usually 1-2 lb (0.5-0.9 kg). This usually means that your daily calorie intake should be reduced by 500-750 calories. Eating 1,200-1,500 calories a day can help most women lose weight. Eating 1,500-1,800 calories a day can help most men lose weight. What do I need to know about calorie counting? Work with your health care provider or dietitian to determine how many calories you should get each day. To meet your daily calorie goal, you will need to: Find out how many calories are in each food that you would like to eat. Try to do this before you eat. Decide how much of the food you plan to eat. Keep a food log. Do this by writing down what you ate and how many calories it had. To successfully lose weight, it is important to balance calorie counting with a healthy lifestyle that includes regular activity. Where do I find calorie information?  The number of calories in a food can be found on a Nutrition Facts label. If a food does not have a Nutrition Facts label, try  to look up the calories online or ask your dietitian for help. Remember that calories are listed per serving. If you choose to have more than one serving of a food, you will have to multiply the calories per serving by the number of servings you plan to eat. For example, the label on a package of bread might say that a serving size is 1 slice and that there are 90 calories in a serving. If you eat 1 slice, you will have eaten 90 calories. If you eat 2 slices, you will have eaten 180 calories. How do I keep a food log? After each time that you eat, record the following in your food log as soon as possible: What you ate. Be sure to include toppings, sauces, and other extras on the food. How much you ate. This can be measured in cups, ounces, or number of items. How many calories were in each food and drink. The total number of calories in the food you ate. Keep your food log near you, such as in a pocket-sized notebook or on an app or website on your mobile phone. Some programs will calculate calories for you and show you how many calories you have left to meet your daily goal. What are some portion-control tips? Know how many calories are in a serving. This will help you know how many servings you can have of a certain   food. Use a measuring cup to measure serving sizes. You could also try weighing out portions on a kitchen scale. With time, you will be able to estimate serving sizes for some foods. Take time to put servings of different foods on your favorite plates or in your favorite bowls and cups so you know what a serving looks like. Try not to eat straight from a food's packaging, such as from a bag or box. Eating straight from the package makes it hard to see how much you are eating and can lead to overeating. Put the amount you would like to eat in a cup or on a plate to make sure you are eating the right portion. Use smaller plates, glasses, and bowls for smaller portions and to prevent  overeating. Try not to multitask. For example, avoid watching TV or using your computer while eating. If it is time to eat, sit down at a table and enjoy your food. This will help you recognize when you are full. It will also help you be more mindful of what and how much you are eating. What are tips for following this plan? Reading food labels Check the calorie count compared with the serving size. The serving size may be smaller than what you are used to eating. Check the source of the calories. Try to choose foods that are high in protein, fiber, and vitamins, and low in saturated fat, trans fat, and sodium. Shopping Read nutrition labels while you shop. This will help you make healthy decisions about which foods to buy. Pay attention to nutrition labels for low-fat or fat-free foods. These foods sometimes have the same number of calories or more calories than the full-fat versions. They also often have added sugar, starch, or salt to make up for flavor that was removed with the fat. Make a grocery list of lower-calorie foods and stick to it. Cooking Try to cook your favorite foods in a healthier way. For example, try baking instead of frying. Use low-fat dairy products. Meal planning Use more fruits and vegetables. One-half of your plate should be fruits and vegetables. Include lean proteins, such as chicken, turkey, and fish. Lifestyle Each week, aim to do one of the following: 150 minutes of moderate exercise, such as walking. 75 minutes of vigorous exercise, such as running. General information Know how many calories are in the foods you eat most often. This will help you calculate calorie counts faster. Find a way of tracking calories that works for you. Get creative. Try different apps or programs if writing down calories does not work for you. What foods should I eat?  Eat nutritious foods. It is better to have a nutritious, high-calorie food, such as an avocado, than a food with  few nutrients, such as a bag of potato chips. Use your calories on foods and drinks that will fill you up and will not leave you hungry soon after eating. Examples of foods that fill you up are nuts and nut butters, vegetables, lean proteins, and high-fiber foods such as whole grains. High-fiber foods are foods with more than 5 g of fiber per serving. Pay attention to calories in drinks. Low-calorie drinks include water and unsweetened drinks. The items listed above may not be a complete list of foods and beverages you can eat. Contact a dietitian for more information. What foods should I limit? Limit foods or drinks that are not good sources of vitamins, minerals, or protein or that are high in unhealthy fats. These   include: Candy. Other sweets. Sodas, specialty coffee drinks, alcohol, and juice. The items listed above may not be a complete list of foods and beverages you should avoid. Contact a dietitian for more information. How do I count calories when eating out? Pay attention to portions. Often, portions are much larger when eating out. Try these tips to keep portions smaller: Consider sharing a meal instead of getting your own. If you get your own meal, eat only half of it. Before you start eating, ask for a container and put half of your meal into it. When available, consider ordering smaller portions from the menu instead of full portions. Pay attention to your food and drink choices. Knowing the way food is cooked and what is included with the meal can help you eat fewer calories. If calories are listed on the menu, choose the lower-calorie options. Choose dishes that include vegetables, fruits, whole grains, low-fat dairy products, and lean proteins. Choose items that are boiled, broiled, grilled, or steamed. Avoid items that are buttered, battered, fried, or served with cream sauce. Items labeled as crispy are usually fried, unless stated otherwise. Choose water, low-fat milk,  unsweetened iced tea, or other drinks without added sugar. If you want an alcoholic beverage, choose a lower-calorie option, such as a glass of wine or light beer. Ask for dressings, sauces, and syrups on the side. These are usually high in calories, so you should limit the amount you eat. If you want a salad, choose a garden salad and ask for grilled meats. Avoid extra toppings such as bacon, cheese, or fried items. Ask for the dressing on the side, or ask for olive oil and vinegar or lemon to use as dressing. Estimate how many servings of a food you are given. Knowing serving sizes will help you be aware of how much food you are eating at restaurants. Where to find more information Centers for Disease Control and Prevention: www.cdc.gov U.[place]. Department of Agriculture: myplate.gov Summary Calorie counting means keeping track of how many calories you eat and drink each day. If you eat fewer calories than your body needs, you should lose weight. A healthy amount of weight to lose per week is usually 1-2 lb (0.5-0.9 kg). This usually means reducing your daily calorie intake by 500-750 calories. The number of calories in a food can be found on a Nutrition Facts label. If a food does not have a Nutrition Facts label, try to look up the calories online or ask your dietitian for help. Use smaller plates, glasses, and bowls for smaller portions and to prevent overeating. Use your calories on foods and drinks that will fill you up and not leave you hungry shortly after a meal. This information is not intended to replace advice given to you by your health care provider. Make sure you discuss any questions you have with your health care provider. Document Revised: 04/16/2019 Document Reviewed: 04/16/2019 Elsevier Patient Education  2023 Elsevier Inc.  

## 2021-08-08 NOTE — Assessment & Plan Note (Signed)
BMI down to Body mass index is 28.63 kg/m. She started on Wegovy at a weight of 210 and is now down to 166 lbs. She is excited about this. Continue Wegovy 2.4 mg for now. Discuss weaning off in the future.

## 2021-08-08 NOTE — Assessment & Plan Note (Signed)
Switch Zyrtec to Xyzal. Continue Singulair and Flonase.

## 2021-08-09 ENCOUNTER — Other Ambulatory Visit (HOSPITAL_COMMUNITY): Payer: Self-pay

## 2021-08-09 ENCOUNTER — Telehealth: Payer: Self-pay | Admitting: Family Medicine

## 2021-08-09 LAB — MICROALBUMIN / CREATININE URINE RATIO
Creatinine, Urine: 15.8 mg/dL
Microalb/Creat Ratio: <19 mg/g creat (ref 0–29)
Microalbumin, Urine: <3 ug/mL

## 2021-08-09 MED ORDER — METFORMIN HCL ER 500 MG PO TB24
500.0000 mg | ORAL_TABLET | Freq: Every day | ORAL | 1 refills | Status: DC
  Filled 2021-08-09: qty 90, 90d supply, fill #0

## 2021-08-09 NOTE — Telephone Encounter (Signed)
I discussed her test results with her. LDL 85, target 70-100. She is compliant with her Crestor 40 and Zetia 10. We will continue the same. Her A1C increased to 10. Goal is <7. She was previously on Metformin which was d/ced once she started [facility]. Plan to restart Metformin 500 mg QD. She agreed with the plan. F/U in 3 months for reassessment.

## 2021-08-10 ENCOUNTER — Encounter: Payer: Self-pay | Admitting: Family Medicine

## 2021-08-10 ENCOUNTER — Ambulatory Visit: Payer: No Typology Code available for payment source | Admitting: Internal Medicine

## 2021-08-10 ENCOUNTER — Other Ambulatory Visit (HOSPITAL_COMMUNITY): Payer: Self-pay

## 2021-08-10 LAB — CMP14+EGFR
ALT: 19 IU/L (ref 0–32)
AST: 19 IU/L (ref 0–40)
Albumin/Globulin Ratio: 1.7 (ref 1.2–2.2)
Albumin: 4.6 g/dL (ref 3.8–4.9)
Alkaline Phosphatase: 101 IU/L (ref 44–121)
BUN/Creatinine Ratio: 9 (ref 9–23)
BUN: 8 mg/dL (ref 6–24)
Bilirubin Total: 0.3 mg/dL (ref 0.0–1.2)
CO2: 24 mmol/L (ref 20–29)
Calcium: 9.4 mg/dL (ref 8.7–10.2)
Chloride: 103 mmol/L (ref 96–106)
Creatinine, Ser: 0.9 mg/dL (ref 0.57–1.00)
Globulin, Total: 2.7 g/dL (ref 1.5–4.5)
Glucose: 72 mg/dL (ref 70–99)
Potassium: 3.8 mmol/L (ref 3.5–5.2)
Sodium: 142 mmol/L (ref 134–144)
Total Protein: 7.3 g/dL (ref 6.0–8.5)
eGFR: 74 mL/min/{1.73_m2} (ref >59)

## 2021-08-10 LAB — LIPID PANEL
Chol/HDL Ratio: 3.2 ratio (ref 0.0–4.4)
Cholesterol, Total: 149 mg/dL (ref 100–199)
HDL: 47 mg/dL (ref >39)
LDL Chol Calc (NIH): 85 mg/dL (ref 0–99)
Triglycerides: 93 mg/dL (ref 0–149)
VLDL Cholesterol Cal: 17 mg/dL (ref 5–40)

## 2021-08-10 LAB — HEMOGLOBIN A1C
Est. average glucose Bld gHb Est-mCnc: 117 mg/dL
Hgb A1c MFr Bld: 5.7 % — ABNORMAL HIGH (ref 4.8–5.6)

## 2021-08-10 NOTE — Telephone Encounter (Signed)
I received a lab error report regarding her A1C result. The new A1C report is 5.7 as opposed to 10 which was originally report.  I called the patient to inform her of the error. Fortunately, she had not picked up her Metformin and I asked her not to restart her Metformin.  She was appreciative of the call.

## 2021-08-11 ENCOUNTER — Other Ambulatory Visit (HOSPITAL_COMMUNITY): Payer: Self-pay

## 2021-08-22 ENCOUNTER — Encounter: Payer: Self-pay | Admitting: *Deleted

## 2021-08-29 ENCOUNTER — Other Ambulatory Visit: Payer: Self-pay | Admitting: Family Medicine

## 2021-08-29 ENCOUNTER — Other Ambulatory Visit (HOSPITAL_COMMUNITY): Payer: Self-pay

## 2021-08-29 MED ORDER — ROSUVASTATIN CALCIUM 40 MG PO TABS
40.0000 mg | ORAL_TABLET | Freq: Every day | ORAL | 1 refills | Status: DC
Start: 2021-08-29 — End: 2022-04-10
  Filled 2021-08-29: qty 90, 90d supply, fill #0
  Filled 2021-12-19: qty 90, 90d supply, fill #1

## 2021-08-29 MED ORDER — PROPRANOLOL HCL 20 MG PO TABS
20.0000 mg | ORAL_TABLET | Freq: Two times a day (BID) | ORAL | 1 refills | Status: DC
Start: 2021-08-29 — End: 2022-06-22
  Filled 2021-08-29: qty 180, 90d supply, fill #0
  Filled 2021-12-19: qty 180, 90d supply, fill #1

## 2021-08-29 MED ORDER — EZETIMIBE 10 MG PO TABS
10.0000 mg | ORAL_TABLET | Freq: Every day | ORAL | 1 refills | Status: DC
Start: 2021-08-29 — End: 2022-04-10
  Filled 2021-08-29: qty 90, 90d supply, fill #0
  Filled 2021-12-19: qty 90, 90d supply, fill #1

## 2021-09-20 ENCOUNTER — Other Ambulatory Visit (HOSPITAL_COMMUNITY): Payer: Self-pay

## 2021-09-25 ENCOUNTER — Other Ambulatory Visit (HOSPITAL_COMMUNITY): Payer: Self-pay

## 2021-10-24 ENCOUNTER — Other Ambulatory Visit (HOSPITAL_COMMUNITY): Payer: Self-pay

## 2021-11-02 ENCOUNTER — Other Ambulatory Visit: Payer: Self-pay | Admitting: Family Medicine

## 2021-11-03 ENCOUNTER — Ambulatory Visit (INDEPENDENT_AMBULATORY_CARE_PROVIDER_SITE_OTHER): Payer: No Typology Code available for payment source | Admitting: Family Medicine

## 2021-11-03 ENCOUNTER — Encounter: Payer: Self-pay | Admitting: Family Medicine

## 2021-11-03 ENCOUNTER — Other Ambulatory Visit (HOSPITAL_COMMUNITY): Payer: Self-pay

## 2021-11-03 VITALS — BP 120/84 | HR 83 | Ht 64.0 in | Wt 163.2 lb

## 2021-11-03 DIAGNOSIS — M79671 Pain in right foot: Secondary | ICD-10-CM | POA: Diagnosis not present

## 2021-11-03 DIAGNOSIS — E1169 Type 2 diabetes mellitus with other specified complication: Secondary | ICD-10-CM | POA: Diagnosis not present

## 2021-11-03 DIAGNOSIS — R5383 Other fatigue: Secondary | ICD-10-CM | POA: Diagnosis not present

## 2021-11-03 LAB — POCT GLYCOSYLATED HEMOGLOBIN (HGB A1C): HbA1c, POC (controlled diabetic range): 5.9 % (ref 0.0–7.0)

## 2021-11-03 MED ORDER — SEMAGLUTIDE-WEIGHT MANAGEMENT 1.7 MG/0.75ML ~~LOC~~ SOAJ
1.7000 mg | SUBCUTANEOUS | 1 refills | Status: DC
Start: 2021-11-03 — End: 2022-05-01
  Filled 2021-11-03 – 2021-11-24 (×2): qty 3, 28d supply, fill #0
  Filled 2021-12-19: qty 3, 28d supply, fill #1

## 2021-11-03 MED ORDER — DICLOFENAC SODIUM 75 MG PO TBEC
75.0000 mg | DELAYED_RELEASE_TABLET | Freq: Two times a day (BID) | ORAL | 0 refills | Status: DC | PRN
  Filled 2021-11-03: qty 30, 15d supply, fill #0

## 2021-11-03 MED ORDER — ZOSTER VAC RECOMB ADJUVANTED 50 MCG/0.5ML IM SUSR
INTRAMUSCULAR | 1 refills | Status: DC
  Filled 2021-11-03: qty 0.5, 1d supply, fill #0
  Filled 2022-01-15: qty 0.5, 1d supply, fill #1

## 2021-11-03 NOTE — Progress Notes (Signed)
    SUBJECTIVE:   CHIEF COMPLAINT / HPI:   DM2/HTN/Weight management: She is compliant with Semaglutide weekly for weight loss and DM. Doing well in general.  Fatigue: She endorses intermittent fatigue. Her appetite is average - reduced a bit since being on Wegovy. No other concerns.   Foot: C/O right foot pain. The pain started about one month ago on her heel and aggravated with the first few steps in the morning. Since then, the pain has progressed to her ankle and the top of her right foot. She denies trauma, no swelling, or loss of sensation. Pain is aggravated by wearing high heels and with mild improvement with Tylenol.  PERTINENT  PMH / PSH: PMHx reviewed   OBJECTIVE:   BP 120/84   Pulse 83   Ht '5\' 4"'$  (1.626 m)   Wt 163 lb 4 oz (74 kg)   LMP  (LMP Unknown)   SpO2 100%   BMI 28.02 kg/m   Physical Exam Vitals and nursing note reviewed.  Cardiovascular:     Rate and Rhythm: Normal rate and regular rhythm.     Pulses: Normal pulses.     Heart sounds: Normal heart sounds. No murmur heard. Pulmonary:     Effort: Pulmonary effort is normal. No respiratory distress.     Breath sounds: Normal breath sounds. No wheezing.  Musculoskeletal:     Right foot: Normal range of motion. No swelling, deformity or tenderness. Normal pulse.     Left foot: Normal range of motion. No swelling, deformity or tenderness. Normal pulse.      ASSESSMENT/PLAN:   DM2 (diabetes mellitus, type 2) (HCC) A1C done today > 5.9% Diet controlled and on Wegovy - semaglutide for weight loss. Monitor closely. F/U in 3-4 months for reassessment.    Fatigue Etiology unclear. ?? Stress related. Consider CBC and Cmet. However, lab is closed for blood work today. F/U soon if symptoms persists for reassessment and lab work.    Foot pain Normal exam today. ?? Plantar fasciitis vs OA. Diclofenac prn prescribed. F/U soon if no improvement.     Andrena Mews, MD [place]

## 2021-11-03 NOTE — Assessment & Plan Note (Signed)
A1C done today > 5.9% Diet controlled and on Wegovy - semaglutide for weight loss. Monitor closely. F/U in 3-4 months for reassessment.

## 2021-11-03 NOTE — Assessment & Plan Note (Addendum)
Etiology unclear. ?? Stress related. Consider CBC and Cmet. However, lab is closed for blood work today. F/U soon if symptoms persists for reassessment and lab work.

## 2021-11-03 NOTE — Assessment & Plan Note (Signed)
Normal exam today. ?? Plantar fasciitis vs OA. Diclofenac prn prescribed. F/U soon if no improvement.

## 2021-11-03 NOTE — Patient Instructions (Signed)
Zoster Vaccine, Recombinant injection What is this medication? ZOSTER VACCINE (ZOS ter vak SEEN) is a vaccine used to reduce the risk of getting shingles. This vaccine is not used to treat shingles or nerve pain from shingles. This medicine may be used for other purposes; ask your health care provider or pharmacist if you have questions. COMMON BRAND NAME(S): [employer] [place] What should I tell my care team before I take this medication? They need to know if you have any of these conditions: cancer immune system problems an unusual or allergic reaction to Zoster vaccine, other medications, foods, dyes, or preservatives pregnant or trying to get pregnant breast-feeding How should I use this medication? This vaccine is injected into a muscle. It is given by a health care provider. A copy of Vaccine Information Statements will be given before each vaccination. Be sure to read this information carefully each time. This sheet may change often. Talk to your health care provider about the use of this vaccine in children. This vaccine is not approved for use in children. Overdosage: If you think you have taken too much of this medicine contact a poison control center or emergency room at once. NOTE: This medicine is only for you. Do not share this medicine with others. What if I miss a dose? Keep appointments for follow-up (booster) doses. It is important not to miss your dose. Call your health care provider if you are unable to keep an appointment. What may interact with this medication? medicines that suppress your immune system medicines to treat cancer steroid medicines like prednisone or cortisone This list may not describe all possible interactions. Give your health care provider a list of all the medicines, herbs, non-prescription drugs, or dietary supplements you use. Also tell them if you smoke, drink alcohol, or use illegal drugs. Some items may interact with your medicine. What should I watch for  while using this medication? Visit your health care provider regularly. This vaccine, like all vaccines, may not fully protect everyone. What side effects may I notice from receiving this medication? Side effects that you should report to your doctor or health care professional as soon as possible: allergic reactions (skin rash, itching or hives; swelling of the face, lips, or tongue) trouble breathing Side effects that usually do not require medical attention (report these to your doctor or health care professional if they continue or are bothersome): chills headache fever nausea pain, redness, or irritation at site where injected tiredness vomiting This list may not describe all possible side effects. Call your doctor for medical advice about side effects. You may report side effects to FDA at 1-800-FDA-1088. Where should I keep my medication? This vaccine is only given by a health care provider. It will not be stored at home. NOTE: This sheet is a summary. It may not cover all possible information. If you have questions about this medicine, talk to your doctor, pharmacist, or health care provider.  2023 Elsevier/Gold Standard (2021-02-03 00:00:00)

## 2021-11-06 ENCOUNTER — Telehealth: Payer: Self-pay

## 2021-11-06 NOTE — Telephone Encounter (Signed)
A Prior Authorization was initiated for this patients WEGOVY (1.'7MG'$  DOSE) through CoverMyMeds.   Key: [id number]

## 2021-11-08 NOTE — Telephone Encounter (Signed)
Prior Auth for patients medication WEGOVY approved by [facility] from 11/07/21 to 11/07/22.  Key: [id number]

## 2021-11-09 ENCOUNTER — Other Ambulatory Visit (HOSPITAL_COMMUNITY): Payer: Self-pay

## 2021-11-24 ENCOUNTER — Other Ambulatory Visit (HOSPITAL_COMMUNITY): Payer: Self-pay

## 2021-12-19 ENCOUNTER — Other Ambulatory Visit (HOSPITAL_COMMUNITY): Payer: Self-pay

## 2021-12-19 ENCOUNTER — Other Ambulatory Visit: Payer: Self-pay | Admitting: Family Medicine

## 2021-12-19 MED ORDER — DICLOFENAC SODIUM 75 MG PO TBEC
75.0000 mg | DELAYED_RELEASE_TABLET | Freq: Two times a day (BID) | ORAL | 0 refills | Status: DC | PRN
Start: 2021-12-19 — End: 2022-01-19
  Filled 2021-12-19: qty 60, 30d supply, fill #0

## 2022-01-03 ENCOUNTER — Ambulatory Visit (INDEPENDENT_AMBULATORY_CARE_PROVIDER_SITE_OTHER): Payer: No Typology Code available for payment source

## 2022-01-03 DIAGNOSIS — Z23 Encounter for immunization: Secondary | ICD-10-CM | POA: Diagnosis not present

## 2022-01-15 ENCOUNTER — Other Ambulatory Visit (HOSPITAL_COMMUNITY): Payer: Self-pay

## 2022-01-16 ENCOUNTER — Other Ambulatory Visit (HOSPITAL_COMMUNITY): Payer: Self-pay

## 2022-01-18 NOTE — Progress Notes (Signed)
  SUBJECTIVE:   CHIEF COMPLAINT / HPI:   Sinus headache/pressure -Ongoing for over 2 weeks, no fevers/sickness, occasional nausea but no vomiting. Unclear trigger but possibly change in weather -Has a history of sinus headaches in the past that have been similar -Occasionally sees some floaters but denies vision loss, flashing lights in her visual field -No weakness or numbness in extremities -Has been using Flonase and Singulair, but has been out of Singulair -Minimal improvement with OTC meds or humidifiers    PERTINENT  PMH / PSH: sinusitis, seasonal allergies    OBJECTIVE:  BP 118/64   Pulse 80   Ht '5\' 4"'$  (1.626 m)   Wt 167 lb (75.8 kg)   LMP  (LMP Unknown)   SpO2 99%   BMI 28.67 kg/m   General: NAD, pleasant, able to participate in exam HEENT: Mild tenderness to palpation of L frontal sinus. No tenderness of ethmoid or maxillary sinuses. No temporal region tenderness. EOMI, PERRLA. Cardiac: RRR, no murmurs auscultated Respiratory: CTAB, normal WOB Abdomen: soft, non-tender, non-distended, normoactive bowel sounds Extremities: warm and well perfused, no edema or cyanosis Skin: warm and dry, no rashes noted Neuro: alert, no obvious focal deficits, speech normal. Cranial nerves II-XII intact without any deficits.  Psych: Normal affect and mood  ASSESSMENT/PLAN:   Sinusitis, unspecified chronicity, unspecified location Assessment & Plan: Suspect acute flare of chronic sinusitis, refractory to OTC conservative management. Despite fevers or other signs of systemic illness, may benefit from short course of antibiotic therapy especially since duration of her symptoms has exceeded 10 days -Augmentin BID x5 days, encouraged continued OTC meds and conservative management -Continue flonase, singulair, xyzal  Orders: -     Amoxicillin-Pot Clavulanate; Take 1 tablet by mouth 2 (two) times daily.  Dispense: 10 tablet; Refill: 0  Nasal congestion -     Montelukast Sodium; Take 1  tablet (10 mg total) by mouth at bedtime.  Dispense: 90 tablet; Refill: 1  Chronic left shoulder pain -     Diclofenac Sodium; Take 1 tablet (75 mg total) by mouth 2 (two) times daily as needed.  Dispense: 60 tablet; Refill: 0   Meds ordered this encounter  Medications   diclofenac (VOLTAREN) 75 MG EC tablet    Sig: Take 1 tablet (75 mg total) by mouth 2 (two) times daily as needed.    Dispense:  60 tablet    Refill:  0   montelukast (SINGULAIR) 10 MG tablet    Sig: Take 1 tablet (10 mg total) by mouth at bedtime.    Dispense:  90 tablet    Refill:  1   amoxicillin-clavulanate (AUGMENTIN) 875-125 MG tablet    Sig: Take 1 tablet by mouth 2 (two) times daily.    Dispense:  10 tablet    Refill:  0   Return if symptoms worsen or fail to improve.

## 2022-01-19 ENCOUNTER — Encounter: Payer: Self-pay | Admitting: Family Medicine

## 2022-01-19 ENCOUNTER — Other Ambulatory Visit (HOSPITAL_COMMUNITY): Payer: Self-pay

## 2022-01-19 ENCOUNTER — Other Ambulatory Visit: Payer: Self-pay | Admitting: Family Medicine

## 2022-01-19 ENCOUNTER — Ambulatory Visit (INDEPENDENT_AMBULATORY_CARE_PROVIDER_SITE_OTHER): Payer: No Typology Code available for payment source | Admitting: Family Medicine

## 2022-01-19 VITALS — BP 118/64 | HR 80 | Ht 64.0 in | Wt 167.0 lb

## 2022-01-19 DIAGNOSIS — R0981 Nasal congestion: Secondary | ICD-10-CM

## 2022-01-19 DIAGNOSIS — M25512 Pain in left shoulder: Secondary | ICD-10-CM | POA: Diagnosis not present

## 2022-01-19 DIAGNOSIS — G8929 Other chronic pain: Secondary | ICD-10-CM

## 2022-01-19 DIAGNOSIS — J329 Chronic sinusitis, unspecified: Secondary | ICD-10-CM | POA: Diagnosis not present

## 2022-01-19 MED ORDER — DICLOFENAC SODIUM 75 MG PO TBEC
75.0000 mg | DELAYED_RELEASE_TABLET | Freq: Two times a day (BID) | ORAL | 0 refills | Status: DC | PRN
  Filled 2022-01-19: qty 60, 30d supply, fill #0

## 2022-01-19 MED ORDER — AMOXICILLIN-POT CLAVULANATE 875-125 MG PO TABS
1.0000 | ORAL_TABLET | Freq: Two times a day (BID) | ORAL | 0 refills | Status: DC
  Filled 2022-01-19: qty 10, 5d supply, fill #0

## 2022-01-19 MED ORDER — MONTELUKAST SODIUM 10 MG PO TABS
10.0000 mg | ORAL_TABLET | Freq: Every day | ORAL | 1 refills | Status: DC
  Filled 2022-01-19: qty 90, 90d supply, fill #0
  Filled 2022-07-31 (×2): qty 90, 90d supply, fill #1

## 2022-01-19 NOTE — Patient Instructions (Signed)
It was great to see you today! Thank you for choosing [facility] for your primary care. [name] was seen for sinus pressure/headache.  Today we addressed: I have sent in a 5 day course of Augmentin (antibiotic) which may help with your sinus symptoms. You can also continue using over the counter medications as well as humidifiers. Please feel free to make another appointment if your symptoms do not improve within the next 2-3 weeks. I have refilled your Voltaren and Singulair, please continue taking these as well as your Flonase   If you haven't already, sign up for My Chart to have easy access to your labs results, and communication with your primary care physician.  We are checking some labs today. If they are abnormal, I will call you. If they are normal, I will send you a MyChart message (if it is active) or a letter in the mail. If you do not hear about your labs in the next 2 weeks, please call the office.   You should return to our clinic Return if symptoms worsen or fail to improve.  I recommend that you always bring your medications to each appointment as this makes it easy to ensure you are on the correct medications and helps [place] not miss refills when you need them.  Please arrive 15 minutes before your appointment to ensure smooth check in process.  We appreciate your efforts in making this happen.  Please call the clinic at [phone] if your symptoms worsen or you have any concerns.  Thank you for allowing me to participate in your care, August Albino, MD 01/19/2022, 10:06 AM PGY-1, [place]

## 2022-01-19 NOTE — Assessment & Plan Note (Signed)
Suspect acute flare of chronic sinusitis, refractory to OTC conservative management. Despite fevers or other signs of systemic illness, may benefit from short course of antibiotic therapy especially since duration of her symptoms has exceeded 10 days -Augmentin BID x5 days, encouraged continued OTC meds and conservative management -Continue flonase, singulair, xyzal

## 2022-01-23 ENCOUNTER — Other Ambulatory Visit: Payer: Self-pay | Admitting: Family Medicine

## 2022-01-23 ENCOUNTER — Ambulatory Visit (INDEPENDENT_AMBULATORY_CARE_PROVIDER_SITE_OTHER): Payer: No Typology Code available for payment source | Admitting: Family Medicine

## 2022-01-23 ENCOUNTER — Encounter: Payer: Self-pay | Admitting: Family Medicine

## 2022-01-23 VITALS — BP 128/90 | HR 66 | Ht 65.0 in | Wt 166.1 lb

## 2022-01-23 DIAGNOSIS — M79621 Pain in right upper arm: Secondary | ICD-10-CM | POA: Diagnosis not present

## 2022-01-23 NOTE — Progress Notes (Signed)
    SUBJECTIVE:   CHIEF COMPLAINT / HPI:   Axilla pain and swelling:  She got a Shingrix shot four days ago associated with arm soreness. The next day, she felt a painful knot in her right axilla. This is her second dose of her Shingrix shot. She tolerated her initial shot with no complications. She is uncertain if the axilla knot is solely related to the shot, and she is concerned about breast cancer since her older sister was recently diagnosed with breast cancer. Axilla swellings remain the same in size, but the pain has improved.   PERTINENT  PMH / PSH: PMHx reviewed  OBJECTIVE:   BP (!) 128/90   Pulse 66   Ht '5\' 5"'$  (1.651 m)   Wt 166 lb 2 oz (75.4 kg)   LMP  (LMP Unknown)   SpO2 100%   BMI 27.64 kg/m   Physical Exam Vitals and nursing note reviewed. Exam conducted with a chaperone present Lavell Anchors).  Cardiovascular:     Rate and Rhythm: Normal rate and regular rhythm.     Heart sounds: Normal heart sounds. No murmur heard. Pulmonary:     Effort: Pulmonary effort is normal. No respiratory distress.     Breath sounds: Normal breath sounds. No wheezing.  Chest:  Breasts:    Breasts are symmetrical.     Right: No nipple discharge or skin change.     Left: No nipple discharge or skin change.     Comments: Firm, rubbery, mildly tender right axillary LN, about 2 cm by 1 cm Lymphadenopathy:     Upper Body:     Right upper body: Axillary adenopathy present.     Left upper body: No axillary adenopathy.  Neurological:     Mental Status: She is alert.      ASSESSMENT/PLAN:   Right axillary lymphadenopathy: Likely reactive from her recent Shingrix injection. She is however concerned about breast cancer. Diagnostic mammogram ordered. I will contact her soon with her results. In the meantime, she will use warm compress and NSAID as needed for pain and swelling. F/U soon if symptoms worsens.    Andrena Mews, MD [place]

## 2022-01-23 NOTE — Patient Instructions (Signed)
Mammogram A mammogram is an X-ray of the breasts. This is done to check for changes that are not normal. This test can look for changes that may be caused by breast cancer or other problems. Mammograms are regularly done on women beginning at age 60. A man may have a mammogram if he has a lump or swelling in his breast. Tell a doctor: About any allergies you have. If you have breast implants. If you have had breast disease, biopsy, or surgery. If you have a family history of breast cancer. If you are breastfeeding. Whether you are pregnant or may be pregnant. What are the risks? Generally, this is a safe procedure. But problems may occur, including: Being exposed to radiation. Radiation levels are very low with this test. The need for more tests. The results were not read properly. Trouble finding breast cancer in women with dense breasts. What happens before the test? Have this test done about 1-2 weeks after your menstrual period. This is often when your breasts are the least tender. If you are visiting a new doctor or clinic, have any past mammogram images sent to your new doctor's office. Wash your breasts and under your arms on the day of the test. Do not use deodorants, perfumes, lotions, or powders on the day of the test. Take off any jewelry from your neck. Wear clothes that you can change into and out of easily. What happens during the test?  You will take off your clothes from the waist up. You will put on a gown. You will stand in front of the X-ray machine. Each breast will be placed between two plastic or glass plates. The plates will press down on your breast for a few seconds. Try to relax. This does not cause any harm to your breasts. It may not feel comfortable, but it will be very brief. X-rays will be taken from different angles of each breast. The procedure may vary among doctors and hospitals. What can I expect after the test? The mammogram will be read by a  specialist (radiologist). You may need to do parts of the test again. This depends on the quality of the images. You may go back to your normal activities. It is up to you to get the results of your test. Ask how to get your results when they are ready. Summary A mammogram is an X-ray of the breasts. It looks for changes that may be caused by breast cancer or other problems. A man may have this test if he has a lump or swelling in his breast. Before the test, tell your doctor about any breast problems that you have had in the past. Have this test done about 1-2 weeks after your menstrual period. Ask when your test results will be ready. Make sure you get your test results. This information is not intended to replace advice given to you by your health care provider. Make sure you discuss any questions you have with your health care provider. Document Revised: 11/16/2020 Document Reviewed: 01/04/2020 Elsevier Patient Education  2023 Elsevier Inc.  

## 2022-02-01 ENCOUNTER — Other Ambulatory Visit: Payer: Self-pay | Admitting: Family Medicine

## 2022-02-01 ENCOUNTER — Ambulatory Visit
Admission: RE | Admit: 2022-02-01 | Discharge: 2022-02-01 | Disposition: A | Discharge status: Home / Self Care | Payer: No Typology Code available for payment source | Source: Ambulatory Visit | Attending: Family Medicine | Admitting: Family Medicine

## 2022-02-01 DIAGNOSIS — M79621 Pain in right upper arm: Secondary | ICD-10-CM

## 2022-02-06 NOTE — Progress Notes (Unsigned)
61 y.o. [id number] Married [place] American female here for annual exam.    PCP:     No LMP recorded (lmp unknown). Patient has had a hysterectomy.           Sexually active: {yes no:314532}  The current method of family planning is status post hysterectomy.    Exercising: {yes no:314532}  {types:19826} Smoker:  {YES [id number]  Health Maintenance: Pap:  12/31/19 Neg:Neg HR HPV, 11/12/16 Vag pap Neg History of abnormal Pap:  yes,LGSIL on pap and vaginal biopsy 2013.   MMG:  02/01/22, Breast Density Category C, BI-RADS CATEGORY 1 Negative Colonoscopy:   09/12/16 BMD:   n/a     Result:   n/a TDaP:  03/04/20 Gardasil:   no HIV:11/18/18 NR Hep C: 12/11/16 Screening Labs:  Hb today: ***, Urine today: ***   reports that she quit smoking about 41 years ago. Her smoking use included cigarettes. She started smoking about 42 years ago. She has a 0.01 pack-year smoking history. She has never used smokeless tobacco. She reports that she does not drink alcohol and does not use drugs.  Past Medical History:  Diagnosis Date   Anxiety    Arthritis    neck , right knee > than left   Brain tumor (benign) ([place]) 2018   Chest pain 4/82/7078   Complication of anesthesia    Depression    in the past   Diabetes mellitus without complication ([place])    Type II   Dizziness 04/26/2020   GERD (gastroesophageal reflux disease)    04/30/2019- "in the past"   H/O varicella    History of measles, mumps, or rubella    HSV-2 (herpes simplex virus 2) infection    Hx of ovarian cyst    Hyperlipidemia    Hypersomnia 07/19/2015   Hypertension    "sometimes"    Migraine headache    "due to cervical issuses"   Persistent fatigue after COVID-19    Plantar fasciitis    PONV (postoperative nausea and vomiting)    Scapular dyskinesis 10/03/2016   Sleep apnea    pt denies   Sleep deprivation    loss of sleep   VAIN I (vaginal intraepithelial neoplasia grade I) 2013   pap and confirmed by colposcopic biopsy    Past  Surgical History:  Procedure Laterality Date   ABDOMINAL HYSTERECTOMY     ANTERIOR CERVICAL DECOMP/DISCECTOMY FUSION N/A 05/07/2019   Procedure: ANTERIOR CERVICAL DECOMPRESSION FUSION CERVICAL 5-6, CERVICAL 6-7 WITH INSTRUMENTATION AND ALLOGRAFT;  Surgeon: Phylliss Bob, MD;  Location: [facility];  Service: Orthopedics;  Laterality: N/A;   CHOLECYSTECTOMY  11/13/10   COLONOSCOPY  Multiple   DILATION AND CURETTAGE OF UTERUS     ESOPHAGOGASTRODUODENOSCOPY     OOPHORECTOMY  2009   bilateral salpingo-oophorectomy.   SHOULDER ARTHROSCOPY WITH ROTATOR CUFF REPAIR Right 08/03/2019   Procedure: SHOULDER ARTHROSCOPY WITH. ROTATOR CUFF DEBRIDEMENT, SUBACROMIAL DECOMPRESSION, DISTAL CLAVICLE EXCISION;  Surgeon: Tania Ade, MD;  Location: [facility];  Service: Orthopedics;  Laterality: Right;   TUBAL LIGATION      Current Outpatient Medications  Medication Sig Dispense Refill   amoxicillin-clavulanate (AUGMENTIN) 875-125 MG tablet Take 1 tablet by mouth 2 (two) times daily. 10 tablet 0   blood glucose meter kit and supplies Dispense based on patient and insurance preference. Use once a day as directed. (FOR ICD-10 E10.9, E11.9). 1 each 0   diclofenac (VOLTAREN) 75 MG EC tablet Take 1 tablet (75 mg total) by mouth 2 (two)  times daily as needed. (Patient not taking: Reported on 01/23/2022) 60 tablet 0   Elastic Bandages & Supports (WRIST BRACE/LEFT MEDIUM) MISC 1 each by Does not apply route as needed. 1 each 0   Estradiol (VAGIFEM) 10 MCG TABS vaginal tablet Place 1 tablet (10 mcg total) vaginally 2 (two) times a week. Use every night before bed for 2 weeks when you first begin this medicine, then use it twice a week. 34 tablet 3   estradiol (VIVELLE-DOT) 0.05 MG/24HR patch Place 1 patch (0.05 mg total) onto the skin 2 (two) times a week. 24 patch 2   ezetimibe (ZETIA) 10 MG tablet Take 1 tablet (10 mg total) by mouth daily. 90 tablet 1   fluticasone (FLONASE) 50 MCG/ACT nasal spray Place  1-2 sprays into both nostrils daily. 16 g 12   levocetirizine (XYZAL) 5 MG tablet Take 1 tablet (5 mg total) by mouth every evening. 90 tablet 1   montelukast (SINGULAIR) 10 MG tablet Take 1 tablet (10 mg total) by mouth at bedtime. 90 tablet 1   propranolol (INDERAL) 20 MG tablet Take 1 tablet (20 mg total) by mouth 2 (two) times daily. 180 tablet 1   rosuvastatin (CRESTOR) 40 MG tablet Take 1 tablet (40 mg total) by mouth at bedtime. 90 tablet 1   Semaglutide-Weight Management 1.7 MG/0.75ML SOAJ Inject 1.7 mg into the skin once a week. 3 mL 1   Zoster Vaccine Adjuvanted [facility]) injection Administer second dose 2 months after the first 0.5 mL 1   No current facility-administered medications for this visit.    Family History  Problem Relation Age of Onset   Hypertension Mother    Hypotension Mother    Anemia Mother        low iron   Breast cancer Sister 52   Heart disease Sister    Alcohol abuse Brother    Alcohol abuse Brother    Aneurysm Brother    Drug abuse Brother    Sickle cell trait Other    Colon cancer Neg Hx    Esophageal cancer Neg Hx    Stomach cancer Neg Hx     Review of Systems  Exam:   LMP  (LMP Unknown)     General appearance: alert, cooperative and appears stated age Head: normocephalic, without obvious abnormality, atraumatic Neck: no adenopathy, supple, symmetrical, trachea midline and thyroid normal to inspection and palpation Lungs: clear to auscultation bilaterally Breasts: normal appearance, no masses or tenderness, No nipple retraction or dimpling, No nipple discharge or bleeding, No axillary adenopathy Heart: regular rate and rhythm Abdomen: soft, non-tender; no masses, no organomegaly Extremities: extremities normal, atraumatic, no cyanosis or edema Skin: skin color, texture, turgor normal. No rashes or lesions Lymph nodes: cervical, supraclavicular, and axillary nodes normal. Neurologic: grossly normal  Pelvic: External genitalia:  no  lesions              No abnormal inguinal nodes palpated.              Urethra:  normal appearing urethra with no masses, tenderness or lesions              Bartholins and Skenes: normal                 Vagina: normal appearing vagina with normal color and discharge, no lesions              Cervix: no lesions  Pap taken: {yes no:314532} Bimanual Exam:  Uterus:  normal size, contour, position, consistency, mobility, non-tender              Adnexa: no mass, fullness, tenderness              Rectal exam: {yes no:314532}.  Confirms.              Anus:  normal sphincter tone, no lesions  Chaperone was present for exam:  ***  Assessment:   Well woman visit with gynecologic exam.   Plan: Mammogram screening discussed. Self breast awareness reviewed. Pap and HR HPV as above. Guidelines for Calcium, Vitamin D, regular exercise program including cardiovascular and weight bearing exercise.   Follow up annually and prn.   Additional counseling given.  {yes [id number]. _______ minutes face to face time of which over 50% was spent in counseling.    After visit summary provided.

## 2022-02-07 ENCOUNTER — Other Ambulatory Visit (HOSPITAL_COMMUNITY): Payer: Self-pay

## 2022-02-07 ENCOUNTER — Ambulatory Visit (INDEPENDENT_AMBULATORY_CARE_PROVIDER_SITE_OTHER): Payer: No Typology Code available for payment source | Admitting: Obstetrics and Gynecology

## 2022-02-07 ENCOUNTER — Encounter: Payer: Self-pay | Admitting: Obstetrics and Gynecology

## 2022-02-07 VITALS — BP 121/82 | Ht 64.0 in | Wt 165.0 lb

## 2022-02-07 DIAGNOSIS — Z01419 Encounter for gynecological examination (general) (routine) without abnormal findings: Secondary | ICD-10-CM | POA: Diagnosis not present

## 2022-02-07 MED ORDER — ESTRADIOL 0.05 MG/24HR TD PTTW
1.0000 | MEDICATED_PATCH | TRANSDERMAL | 3 refills | Status: DC
  Filled 2022-02-07 – 2022-06-18 (×2): qty 24, 84d supply, fill #0
  Filled 2022-09-06: qty 24, 84d supply, fill #1

## 2022-02-07 MED ORDER — ESTRADIOL 10 MCG VA TABS
1.0000 | ORAL_TABLET | VAGINAL | 3 refills | Status: DC
  Filled 2022-02-07: qty 24, 84d supply, fill #0
  Filled 2022-07-31: qty 24, 84d supply, fill #1

## 2022-02-07 NOTE — Patient Instructions (Signed)

## 2022-02-13 ENCOUNTER — Other Ambulatory Visit (HOSPITAL_COMMUNITY): Payer: Self-pay

## 2022-03-23 ENCOUNTER — Encounter: Payer: Self-pay | Admitting: Family Medicine

## 2022-03-23 ENCOUNTER — Other Ambulatory Visit: Payer: Self-pay

## 2022-03-23 ENCOUNTER — Other Ambulatory Visit (HOSPITAL_COMMUNITY): Payer: Self-pay

## 2022-03-23 ENCOUNTER — Ambulatory Visit (INDEPENDENT_AMBULATORY_CARE_PROVIDER_SITE_OTHER): Payer: 59 | Admitting: Family Medicine

## 2022-03-23 VITALS — BP 155/85 | HR 84 | Temp 99.7°F | Wt 172.2 lb

## 2022-03-23 DIAGNOSIS — I10 Essential (primary) hypertension: Secondary | ICD-10-CM

## 2022-03-23 DIAGNOSIS — L282 Other prurigo: Secondary | ICD-10-CM

## 2022-03-23 DIAGNOSIS — J069 Acute upper respiratory infection, unspecified: Secondary | ICD-10-CM | POA: Diagnosis not present

## 2022-03-23 LAB — POCT INFLUENZA A/B
Influenza A, POC: NEGATIVE
Influenza B, POC: NEGATIVE

## 2022-03-23 MED ORDER — HYDROCORTISONE 2.5 % EX OINT
1.0000 | TOPICAL_OINTMENT | Freq: Two times a day (BID) | CUTANEOUS | 0 refills | Status: DC
  Filled 2022-03-23: qty 20, 30d supply, fill #0

## 2022-03-23 NOTE — Patient Instructions (Addendum)
It was wonderful to see you today.  Please bring ALL of your medications with you to every visit.   Today we talked about:  I have sent in a steroid ointment for you to use to your back  For your sick symptoms: -We are testing for flu. I will let you know the results -Cough drops, teaspoon of honey to help with your cough -Mucinex -Nasal saline spray -Avoid decongestants- this can raise your blood pressure  Thank you for coming to your visit as scheduled. We have had a large "no-show" problem lately, and this significantly limits our ability to see and care for patients. As a friendly reminder- if you cannot make your appointment please call to cancel. We do have a no show policy for those who do not cancel within 24 hours. Our policy is that if you miss or fail to cancel an appointment within 24 hours, 3 times in a 28-monthperiod, you may be dismissed from our clinic.   Thank you for choosing [facility]   Please call [phone] any questions about today's appointment.  Please be sure to schedule follow up at the front  desk before you leave today.   ASharion Settler DO PGY-3 Family Medicine

## 2022-03-23 NOTE — Progress Notes (Signed)
    SUBJECTIVE:   CHIEF COMPLAINT / HPI:   [name] is a 62 y.o. female who presents to the [facility] clinic today to discuss the following concerns:   Skin Concern She has a very itchy area to her left upper back for about 2-3 months. Getting worse. Initially started off flat, now has "little hives". She has been using Benadryl gel and OTC hydrocortisone cream.   Sick Symptoms Started feeling ill last night/early this morning. Her husband drives a school bus so he is always in contact with sick kiddos. She has not taken her temperature but feels feverish. She has had a non-productive cough. Sore throat, scratchy throat, frontal headache. No nasal congestion. Does have a runny nose- this has been ongoing since she had COVID in 2021. She uses Flonase daily but it isn't helping. Has had some muscle aches.   Has had Flu vaccine and COVID vaccines and booster.   PERTINENT  PMH / PSH: Hx HTN not on medications   OBJECTIVE:   BP (!) 155/85   Pulse 84   Temp 99.7 F (37.6 C)   Wt 172 lb 3.2 oz (78.1 kg)   LMP  (LMP Unknown)   SpO2 99%   BMI 29.56 kg/m   Vitals:   03/23/22 1539 03/23/22 1604  BP: (!) 150/92 (!) 155/85  Pulse: 84   Temp:  99.7 F (37.6 C)  SpO2: 99%    General: NAD, pleasant, able to participate in exam Respiratory: normal effort, intermittent dry cough appreciated  Skin: warm and dry, approximately 4.5 x 3 cm slightly raised and hyperpigmented area to left upper back (see below)  Psych: Normal affect and mood    ASSESSMENT/PLAN:   1. Viral URI with cough Rapid flu testing negative. She is vaccinated against COVID. Acute symptoms without fever and otherwise appears well. Continue conservative/supportive measures.  - Influenza A/B  2. Pruritic rash Acute onset. Examination notable for slightly hyperpigmented patch to left upper back around scapula. Consider notalgia paresthetica as cause.  Will trial topical corticosteroids, if symptoms persist,  consider intralesional corticosteroid. - hydrocortisone 2.5 % ointment; Apply 1 application topically 2 (two) times daily.  Dispense: 20 g; Refill: 0  3. Essential hypertension Blood pressure elevated x 2 checks today.  States that she has history of hypertension but was able to be taken off her medications. Recommend f/u with PCP for recheck, may need to start back on anti-hypertensive if persists.    Sharion Settler, [place]

## 2022-03-28 ENCOUNTER — Emergency Department (HOSPITAL_COMMUNITY): Payer: 59

## 2022-03-28 ENCOUNTER — Ambulatory Visit (INDEPENDENT_AMBULATORY_CARE_PROVIDER_SITE_OTHER): Payer: 59 | Admitting: Student

## 2022-03-28 ENCOUNTER — Emergency Department (HOSPITAL_COMMUNITY)
Admission: EM | Admit: 2022-03-28 | Discharge: 2022-03-28 | Disposition: A | Discharge status: Home / Self Care | Payer: 59 | Attending: Emergency Medicine | Admitting: Emergency Medicine

## 2022-03-28 ENCOUNTER — Other Ambulatory Visit: Payer: Self-pay

## 2022-03-28 ENCOUNTER — Ambulatory Visit (HOSPITAL_COMMUNITY): Payer: 59

## 2022-03-28 VITALS — BP 152/91 | HR 75 | Wt 174.2 lb

## 2022-03-28 DIAGNOSIS — R079 Chest pain, unspecified: Secondary | ICD-10-CM | POA: Diagnosis not present

## 2022-03-28 DIAGNOSIS — U071 COVID-19: Secondary | ICD-10-CM

## 2022-03-28 DIAGNOSIS — Z794 Long term (current) use of insulin: Secondary | ICD-10-CM | POA: Diagnosis not present

## 2022-03-28 DIAGNOSIS — R091 Pleurisy: Secondary | ICD-10-CM

## 2022-03-28 DIAGNOSIS — I10 Essential (primary) hypertension: Secondary | ICD-10-CM | POA: Diagnosis not present

## 2022-03-28 DIAGNOSIS — Z79899 Other long term (current) drug therapy: Secondary | ICD-10-CM | POA: Insufficient documentation

## 2022-03-28 DIAGNOSIS — E119 Type 2 diabetes mellitus without complications: Secondary | ICD-10-CM | POA: Diagnosis not present

## 2022-03-28 DIAGNOSIS — J029 Acute pharyngitis, unspecified: Secondary | ICD-10-CM | POA: Diagnosis not present

## 2022-03-28 DIAGNOSIS — R0789 Other chest pain: Secondary | ICD-10-CM | POA: Diagnosis not present

## 2022-03-28 DIAGNOSIS — R070 Pain in throat: Secondary | ICD-10-CM | POA: Diagnosis not present

## 2022-03-28 DIAGNOSIS — R000 Tachycardia, unspecified: Secondary | ICD-10-CM | POA: Diagnosis not present

## 2022-03-28 DIAGNOSIS — I3139 Other pericardial effusion (noninflammatory): Secondary | ICD-10-CM | POA: Diagnosis not present

## 2022-03-28 DIAGNOSIS — R059 Cough, unspecified: Secondary | ICD-10-CM | POA: Diagnosis not present

## 2022-03-28 DIAGNOSIS — R509 Fever, unspecified: Secondary | ICD-10-CM | POA: Diagnosis not present

## 2022-03-28 DIAGNOSIS — K449 Diaphragmatic hernia without obstruction or gangrene: Secondary | ICD-10-CM | POA: Diagnosis not present

## 2022-03-28 LAB — D-DIMER, QUANTITATIVE: D-Dimer, Quant: 0.7 ug/mL-FEU — ABNORMAL HIGH (ref 0.00–0.50)

## 2022-03-28 LAB — BASIC METABOLIC PANEL
Anion gap: 9 (ref 5–15)
BUN: <5 mg/dL — ABNORMAL LOW (ref 6–20)
CO2: 26 mmol/L (ref 22–32)
Calcium: 8.8 mg/dL — ABNORMAL LOW (ref 8.9–10.3)
Chloride: 106 mmol/L (ref 98–111)
Creatinine, Ser: 0.8 mg/dL (ref 0.44–1.00)
GFR, Estimated: >60 mL/min (ref >60)
Glucose, Bld: 99 mg/dL (ref 70–99)
Potassium: 4.1 mmol/L (ref 3.5–5.1)
Sodium: 141 mmol/L (ref 135–145)

## 2022-03-28 LAB — CBC
HCT: 40.9 % (ref 36.0–46.0)
Hemoglobin: 12.9 g/dL (ref 12.0–15.0)
MCH: 28.8 pg (ref 26.0–34.0)
MCHC: 31.5 g/dL (ref 30.0–36.0)
MCV: 91.3 fL (ref 80.0–100.0)
Platelets: 270 10*3/uL (ref 150–400)
RBC: 4.48 MIL/uL (ref 3.87–5.11)
RDW: 14.1 % (ref 11.5–15.5)
WBC: 5.9 10*3/uL (ref 4.0–10.5)
nRBC: 0 % (ref 0.0–0.2)

## 2022-03-28 LAB — TROPONIN I (HIGH SENSITIVITY)
Troponin I (High Sensitivity): 3 ng/L (ref <18)
Troponin I (High Sensitivity): 3 ng/L (ref <18)

## 2022-03-28 LAB — RESP PANEL BY RT-PCR (RSV, FLU A&B, COVID)  RVPGX2
Influenza A by PCR: NEGATIVE
Influenza B by PCR: NEGATIVE
Resp Syncytial Virus by PCR: NEGATIVE
SARS Coronavirus 2 by RT PCR: POSITIVE — AB

## 2022-03-28 MED ORDER — GUAIFENESIN 100 MG/5ML PO LIQD
10.0000 mL | Freq: Once | ORAL | Status: AC
  Administered 2022-03-28: 10 mL via ORAL
  Filled 2022-03-28: qty 10

## 2022-03-28 MED ORDER — KETOROLAC TROMETHAMINE 15 MG/ML IJ SOLN
15.0000 mg | Freq: Once | INTRAMUSCULAR | Status: AC
  Administered 2022-03-28: 15 mg via INTRAVENOUS
  Filled 2022-03-28: qty 1

## 2022-03-28 MED ORDER — ASPIRIN 325 MG PO TABS
325.0000 mg | ORAL_TABLET | Freq: Once | ORAL | Status: AC
  Administered 2022-03-28: 325 mg via ORAL

## 2022-03-28 MED ORDER — BENZONATATE 100 MG PO CAPS
100.0000 mg | ORAL_CAPSULE | Freq: Three times a day (TID) | ORAL | 0 refills | Status: DC
  Filled 2022-03-28: qty 21, 7d supply, fill #0

## 2022-03-28 MED ORDER — IOHEXOL 350 MG/ML SOLN
75.0000 mL | Freq: Once | INTRAVENOUS | Status: AC | PRN
  Administered 2022-03-28: 75 mL via INTRAVENOUS

## 2022-03-28 NOTE — ED Provider Notes (Signed)
[facility] EMERGENCY DEPARTMENT Provider Note   CSN: [id number] Arrival date & time: 03/28/22  1010     History  Chief Complaint  Patient presents with   Cough   Fever   Sore Throat   Chills   loss of taste   Chest Pain    [name] is a 62 y.o. female with a past medical history of hyperlipidemia, type 2 diabetes, GERD and hypertension presenting today from the PCP office.  She was seen by their office around a week ago and was diagnosed with a viral illness.  She went back today complaining of some chest pain that radiated through to her back.  Also said that she was becoming increasingly dyspneic when walking at home.  Continues to complain of cough, congestion, sore throat and other viral symptoms.  She was negative for flu and COVID when seen by the PCP 5 days ago and reports a negative COVID test at home.  No known sick contacts.  She was sent to the emergency department by PCP due to her risk factors for ACS.     Cough Associated symptoms: chest pain and fever   Fever Associated symptoms: chest pain and cough   Sore Throat Associated symptoms include chest pain.  Chest Pain Associated symptoms: cough and fever        Home Medications Prior to Admission medications   Medication Sig Start Date End Date Taking? Authorizing Provider  blood glucose meter kit and supplies Dispense based on patient and insurance preference. Use once a day as directed. (FOR ICD-10 E10.9, E11.9). 07/29/19   Philemon Kingdom, MD  diclofenac (VOLTAREN) 75 MG EC tablet Take 1 tablet (75 mg total) by mouth 2 (two) times daily as needed. Patient not taking: Reported on 03/23/2022 01/19/22   August Albino, MD  Elastic Bandages & Supports (WRIST BRACE/LEFT MEDIUM) MISC 1 each by Does not apply route as needed. 05/16/18   Guadalupe Dawn, MD  Estradiol (VAGIFEM) 10 MCG TABS vaginal tablet Place 1 tablet (10 mcg total) vaginally 2 (two) times a week. 02/08/22   Nunzio Cobbs, MD  estradiol (VIVELLE-DOT) 0.05 MG/24HR patch Place 1 patch (0.05 mg total) onto the skin 2 (two) times a week. 02/08/22   Nunzio Cobbs, MD  ezetimibe (ZETIA) 10 MG tablet Take 1 tablet (10 mg total) by mouth daily. 08/29/21 03/23/22  [name], MD  fluticasone (FLONASE) 50 MCG/ACT nasal spray Place 1-2 sprays into both nostrils daily. 05/17/21   Holley Bouche, MD  hydrocortisone 2.5 % ointment Apply 1 application topically 2 (two) times daily. 03/23/22   Sharion Settler, DO  levocetirizine (XYZAL) 5 MG tablet Take 1 tablet (5 mg total) by mouth every evening. 08/08/21   [name], MD  montelukast (SINGULAIR) 10 MG tablet Take 1 tablet (10 mg total) by mouth at bedtime. Patient not taking: Reported on 03/23/2022 01/19/22   August Albino, MD  propranolol (INDERAL) 20 MG tablet Take 1 tablet (20 mg total) by mouth 2 (two) times daily. 08/29/21   [name], MD  rosuvastatin (CRESTOR) 40 MG tablet Take 1 tablet (40 mg total) by mouth at bedtime. 08/29/21 03/23/22  [name], MD  Semaglutide-Weight Management 1.7 MG/0.75ML SOAJ Inject 1.7 mg into the skin once a week. Patient not taking: Reported on 03/23/2022 11/03/21   [name], MD      Allergies    Sulfa antibiotics    Review of  Systems   Review of Systems  Constitutional:  Positive for fever.  Respiratory:  Positive for cough.   Cardiovascular:  Positive for chest pain.    Physical Exam Updated Vital Signs BP 129/84 (BP Location: Right Arm)   Pulse (!) 59   Temp 98.3 F (36.8 C)   Resp 16   LMP  (LMP Unknown)   SpO2 100%  Physical Exam Vitals and nursing note reviewed.  Constitutional:      General: She is not in acute distress.    Appearance: Normal appearance. She is not ill-appearing.  HENT:     Head: Normocephalic and atraumatic.     Mouth/Throat:     Mouth: Mucous membranes are moist.     Pharynx: Oropharynx is clear.  Eyes:     General: No scleral icterus.     Conjunctiva/sclera: Conjunctivae normal.  Cardiovascular:     Rate and Rhythm: Normal rate and regular rhythm.     Comments: No peripheral edema Pulmonary:     Effort: Pulmonary effort is normal. No respiratory distress.     Breath sounds: Normal breath sounds. No wheezing.  Skin:    Findings: No rash.  Neurological:     Mental Status: She is alert.  Psychiatric:        Mood and Affect: Mood normal.     ED Results / Procedures / Treatments   Labs (all labs ordered are listed, but only abnormal results are displayed) Labs Reviewed  BASIC METABOLIC PANEL - Abnormal; Notable for the following components:      Result Value   BUN <5 (*)    Calcium 8.8 (*)    All other components within normal limits  RESP PANEL BY RT-PCR (RSV, FLU A&B, COVID)  RVPGX2  CBC  D-DIMER, QUANTITATIVE  TROPONIN I (HIGH SENSITIVITY)  TROPONIN I (HIGH SENSITIVITY)    EKG EKG Interpretation  Date/Time:  Wednesday March 28 2022 10:24:24 EST Ventricular Rate:  63 PR Interval:  158 QRS Duration: 72 QT Interval:  404 QTC Calculation: 413 R Axis:   76 Text Interpretation: Normal sinus rhythm Normal ECG When compared with ECG of 28-Mar-2022 10:06, PREVIOUS ECG IS PRESENT Confirmed by Margaretmary Eddy [phone]) on 03/28/2022 2:57:49 PM  Radiology DG Chest 1 View  Result Date: 03/28/2022 CLINICAL DATA:  Sore throat, chills, fever. EXAM: CHEST  1 VIEW COMPARISON:  06/24/2017 FINDINGS: Lower cervical plate and screw fixator. The lungs appear clear. Cardiac and mediastinal contours normal. No pleural effusion identified. Right upper quadrant clips are likely from prior cholecystectomy. No significant bony abnormality observed. IMPRESSION: 1. No active cardiopulmonary disease is radiographically apparent. Electronically Signed   By: Van Clines M.D.   On: 03/28/2022 10:56    Procedures Procedures   Medications Ordered in ED Medications  guaiFENesin (ROBITUSSIN) 100 MG/5ML liquid 10 mL (10 mLs Oral  Given 03/28/22 1650)  ketorolac (TORADOL) 15 MG/ML injection 15 mg (15 mg Intravenous Given 03/28/22 1858)  iohexol (OMNIPAQUE) 350 MG/ML injection 75 mL (75 mLs Intravenous Contrast Given 03/28/22 1909)    ED Course/ Medical Decision Making/ A&P Clinical Course as of 03/28/22 1837  Wed Mar 28, 2022  1825 Patient reeval waited.  Her husband is now at bedside.  We discussed her elevated D-dimer and the necessity of a CT scan.  I did tell her that she may eat.  She is requesting medications for her pain but she does not want anything that will make her drowsy.  Given IV Toradol. [MR]  Clinical Course User Index [MR] Zorion Nims, Cecilio Asper, PA-C                           Medical Decision Making Amount and/or Complexity of Data Reviewed Labs: ordered. Radiology: ordered.  Risk OTC drugs. Prescription drug management.   62 year old female presenting today with viral symptoms as well as chest pain, worse with inspiration.  Differential includes but is not limited to COVID, flu, RSV, other viral illness, pneumonia, pneumothorax, pulmonary edema, heart failure, ACS, PE   this is not an exhaustive differential.    Past Medical History / Co-morbidities / Social History: Hypertension, hyperlipidemia, type 2 diabetes     Physical Exam: Pertinent physical exam findings include No peripheral edema RRR, lung sounds clear  Lab Tests: I ordered, and personally interpreted labs.  The pertinent results include: Troponin negative x 2 D dimer elevated   Imaging Studies: Chest x-ray ordered, negative.  CT PE also ordered, viewed and   Cardiac Monitoring:  The patient was maintained on a cardiac monitor.  I viewed and interpreted the cardiac monitored which showed an underlying rhythm of: Sinus, no premature beats noted   Medications: Declined any pain medication but excepted Robitussin for her cough.  On reevaluation Robitussin alleviated her cough.     Toradol was given for pain     MDM/Disposition: This is a 62 year old female who was sent by PCP due to her risk factors for ACS.  Was complaining of viral symptoms but also mention chest pain that was sharp and radiated through to her back.  Worse with inspiration and exertion.  Chest x-ray negative.  EKG and troponins unremarkable.  Heart score is 3, slowly based off of her risk factors and age.  D-dimer elevated. CTPE was negative.   At this time I believe the patient has been reasonably screened for any life-threatening emergent condition.  She does have risk factors for ACS and I believe that she should follow-up with her PCP about this who ultimately may give her a referral to cardiology however there is no indication for cardiology consult in the setting of her positive COVID-19 test today.  Symptoms likely secondary to pleurisy from her cough.  We discussed over-the-counter medications that may help her.  Unfortunately, patient's symptoms have been going on for at least 5 days, likely 7.  Do not believe she is a strong candidate for antiviral therapy.  She is also on a statin.  We discussed that there may not be much benefit of this medication at this time.  She is at increased risk for poor COVID infection however she is had the symptoms for a week and has not had any compromise.  She is not hypoxic or showing any signs of pneumonia.  She is ambulatory with a normal oxygen saturation.  She will be discharged for follow-up with PCP who sent her here today.    I discussed this case with my attending physician Dr. Philip Aspen who cosigned this note including patient's presenting symptoms, physical exam, and planned diagnostics and interventions. Attending physician stated agreement with plan or made changes to plan which were implemented.      Final Clinical Impression(s) / ED Diagnoses Final diagnoses:  COVID-19  Pleurisy    Rx / DC Orders ED Discharge Orders          Ordered    benzonatate (TESSALON) 100 MG capsule   Every 8 hours  03/28/22 1837           Results and diagnoses were explained to the patient. Return precautions discussed in full. Patient had no additional questions and expressed complete understanding.   This chart was dictated using voice recognition software.  Despite best efforts to proofread,  errors can occur which can change the documentation meaning.    Darliss Ridgel 03/28/22 1944    Fransico Meadow, MD 03/29/22 1016

## 2022-03-28 NOTE — Progress Notes (Signed)
    SUBJECTIVE:   CHIEF COMPLAINT / HPI:   Mahika is a 62 year-old female here for chest pain and shortness of breath.    Chest pain started yesterday, is in the center of her chest and described as dull- constant but worsens with movement. Felt "wore out" 2 days ago with walking and short of breath going from room to room, which is not normal for her.  Not a smoker. No diagnosis of CHF or COPD.  However, she does have many risk factors including hyperlipidemia, hypertension, and she is on estrogen patches.  She was seen in our clinic on 03/23/2022 for cough, sore throat, frontal headache and was negative for flu and COVID. She is UTD on flu and COVID vaccinations. She does state that her sick symptoms are improving and she no longer has a sore throat or headache.  Still having a runny nose  PERTINENT  PMH / PSH: HTN (not currently on medication)  OBJECTIVE:   BP (!) 152/91   Pulse 75   Wt 174 lb 3.2 oz (79 kg)   LMP  (LMP Unknown)   SpO2 100%   BMI 29.90 kg/m   General: Generally well-appearing, no distress, intermittently clutches her chest and winces from chest pain CV: Regular rate and rhythm. No appreciable murmurs. Respiratory: Intermittently tachypneic. No wheezing or crackles. Slightly diminished lung sounds at the bases Skin: Warm, dry Neuro: Awake, alert, oriented.   ASSESSMENT/PLAN:   Chest pain 62 year old female here with typical chest pain since last night that is described as substernal, lasting more than a few minutes. Is constantly there, but does worsen with activity. She is hemodynamically stable, with SpO2 100% on room air, heart rate within normal limits.  She is hypertensive to 152/91.  Her skin is warm and dry, and her heart is regular rate and rhythm. Intermittently clutched her chest in pain with tachypnea. EKG obtained which showed NSR without any acute ST changes (no STEMI visualized). Administered ASA 325 mg x1. She has multiple risk factors for ACS  including hypertension, hyperlipidemia, on estrogen. Given high risk, will transfer to the ED for further evaluation for ACS, MI, PE, etc.  Patient is agreeable to this. I called EMS for transfer.  Also called ED charge nurse and gave report.   Orvis Brill, [place]

## 2022-03-28 NOTE — ED Triage Notes (Signed)
EMS stated, she has had COVID symptoms with sore throat, chills , fever, today was seen by [facility] with chest pain radiating to her back.

## 2022-03-28 NOTE — Assessment & Plan Note (Addendum)
62 year old female here with typical chest pain since last night that is described as substernal, lasting more than a few minutes. Is constantly there, but does worsen with activity. She is hemodynamically stable, with SpO2 100% on room air, heart rate within normal limits.  She is hypertensive to 152/91.  Her skin is warm and dry, and her heart is regular rate and rhythm. Intermittently clutched her chest in pain with tachypnea. EKG obtained which showed NSR without any acute ST changes (no STEMI visualized). Administered ASA 325 mg x1. She has multiple risk factors for ACS including hypertension, hyperlipidemia, on estrogen. Given high risk, will transfer to the ED for further evaluation for ACS, MI, PE, etc.  Patient is agreeable to this. I called EMS for transfer.  Also called ED charge nurse and gave report.

## 2022-03-28 NOTE — Discharge Instructions (Addendum)
You came to the emergency department today with pains in your chest that were shooting through to your back.  You were also having increasing shortness of breath.   Your chest x-ray looks normal.  Your lab work is also normal and your EKG does not show any signs of heart damage.  Your blood clot lab was elevated so you received a CT scan that did not show any blood clots in your lungs.  At this time we suspect that your pain is secondary to your coughing from COVID.  COVID may also make people short of breath and weak.  You may use DayQuil and NyQuil for your symptoms.  I have sent a medication for cough to your pharmacy as well but there are multiple options over-the-counter.  Please follow-up with your primary care after your cold symptoms resolve because they may want you to see a cardiologist if your pains continue.  Return to the emergency department with any worsening symptoms.  It was a pleasure to meet you and we hope you feel better! Your work note is attached.

## 2022-03-28 NOTE — ED Provider Triage Note (Signed)
Emergency Medicine Provider Triage Evaluation Note  [name] , a 62 y.o. female  was evaluated in triage.  Patient presents here complaining of chest pain that is been ongoing for the past 2 days, shortness of breath with any sort of exertion.  She is not a smoker and no history of CHF, she has been experiencing fever, cough and URI symptoms as well but these are improving. No prior hx of CAD, no family hx of CAD.    Review of Systems  Positive: Cough, fever,  Negative: Headache, dizziness  Physical Exam  LMP  (LMP Unknown)  Gen:   Awake, no distress   Resp:  Normal effort  MSK:   Moves extremities without difficulty  Other:   Medical Decision Making  Medically screening exam initiated at 10:26 AM.  Appropriate orders placed.  Nicola Heinemann [name] was informed that the remainder of the evaluation will be completed by another provider, this initial triage assessment does not replace that evaluation, and the importance of remaining in the ED until their evaluation is complete.     Janeece Fitting, PA-C 03/28/22 1046

## 2022-03-29 ENCOUNTER — Other Ambulatory Visit (HOSPITAL_COMMUNITY): Payer: Self-pay

## 2022-04-09 ENCOUNTER — Other Ambulatory Visit (HOSPITAL_COMMUNITY): Payer: Self-pay

## 2022-04-10 ENCOUNTER — Encounter: Payer: Self-pay | Admitting: Family Medicine

## 2022-04-10 ENCOUNTER — Ambulatory Visit (INDEPENDENT_AMBULATORY_CARE_PROVIDER_SITE_OTHER): Payer: 59 | Admitting: Family Medicine

## 2022-04-10 ENCOUNTER — Other Ambulatory Visit: Payer: Self-pay | Admitting: Family Medicine

## 2022-04-10 ENCOUNTER — Other Ambulatory Visit (HOSPITAL_COMMUNITY): Payer: Self-pay

## 2022-04-10 VITALS — BP 137/85 | Ht 64.0 in | Wt 171.2 lb

## 2022-04-10 DIAGNOSIS — E663 Overweight: Secondary | ICD-10-CM | POA: Diagnosis not present

## 2022-04-10 DIAGNOSIS — I5189 Other ill-defined heart diseases: Secondary | ICD-10-CM

## 2022-04-10 DIAGNOSIS — U071 COVID-19: Secondary | ICD-10-CM

## 2022-04-10 DIAGNOSIS — L309 Dermatitis, unspecified: Secondary | ICD-10-CM | POA: Diagnosis not present

## 2022-04-10 DIAGNOSIS — J310 Chronic rhinitis: Secondary | ICD-10-CM | POA: Insufficient documentation

## 2022-04-10 DIAGNOSIS — E1169 Type 2 diabetes mellitus with other specified complication: Secondary | ICD-10-CM | POA: Diagnosis not present

## 2022-04-10 LAB — POCT GLYCOSYLATED HEMOGLOBIN (HGB A1C): HbA1c, POC (controlled diabetic range): 5.8 % (ref 0.0–7.0)

## 2022-04-10 MED ORDER — BETAMETHASONE DIPROPIONATE 0.05 % EX CREA
TOPICAL_CREAM | Freq: Two times a day (BID) | CUTANEOUS | 0 refills | Status: DC
  Filled 2022-04-10: qty 30, 30d supply, fill #0

## 2022-04-10 NOTE — Patient Instructions (Signed)
Echocardiogram An echocardiogram is a test that uses sound waves (ultrasound) to produce images of the heart. Images from an echocardiogram can provide important information about: Heart size and shape. The size and thickness and movement of your heart's walls. Heart muscle function and strength. Heart valve function or if you have stenosis. Stenosis is when the heart valves are too narrow. If blood is flowing backward through the heart valves (regurgitation). A tumor or infectious growth around the heart valves. Areas of heart muscle that are not working well because of poor blood flow or injury from a heart attack. Aneurysm detection. An aneurysm is a weak or damaged part of an artery wall. The wall bulges out from the normal force of blood pumping through the body. Tell a health care provider about: Any allergies you have. All medicines you are taking, including vitamins, herbs, eye drops, creams, and over-the-counter medicines. Any blood disorders you have. Any surgeries you have had. Any medical conditions you have. Whether you are pregnant or may be pregnant. What are the risks? Generally, this is a safe test. However, problems may occur, including an allergic reaction to dye (contrast) that may be used during the test. What happens before the test? No specific preparation is needed. You may eat and drink normally. What happens during the test?  You will take off your clothes from the waist up and put on a hospital gown. Electrodes or electrocardiogram (ECG)patches may be placed on your chest. The electrodes or patches are then connected to a device that monitors your heart rate and rhythm. You will lie down on a table for an ultrasound exam. A gel will be applied to your chest to help sound waves pass through your skin. A handheld device, called a transducer, will be pressed against your chest and moved over your heart. The transducer produces sound waves that travel to your heart  and bounce back (or "echo" back) to the transducer. These sound waves will be captured in real-time and changed into images of your heart that can be viewed on a video monitor. The images will be recorded on a computer and reviewed by your health care provider. You may be asked to change positions or hold your breath for a short time. This makes it easier to get different views or better views of your heart. In some cases, you may receive contrast through an IV in one of your veins. This can improve the quality of the pictures from your heart. The procedure may vary among health care providers and hospitals. What can I expect after the test? You may return to your normal, everyday life, including diet, activities, and medicines, unless your health care provider tells you not to do that. Follow these instructions at home: It is up to you to get the results of your test. Ask your health care provider, or the department that is doing the test, when your results will be ready. Keep all follow-up visits. This is important. Summary An echocardiogram is a test that uses sound waves (ultrasound) to produce images of the heart. Images from an echocardiogram can provide important information about the size and shape of your heart, heart muscle function, heart valve function, and other possible heart problems. You do not need to do anything to prepare before this test. You may eat and drink normally. After the echocardiogram is completed, you may return to your normal, everyday life, unless your health care provider tells you not to do that. This information is not   intended to replace advice given to you by your health care provider. Make sure you discuss any questions you have with your health care provider. Document Revised: 11/16/2020 Document Reviewed: 10/27/2019 Elsevier Patient Education  2023 Elsevier Inc.  

## 2022-04-10 NOTE — Assessment & Plan Note (Signed)
Failed trial of Xyzal, Flonase and Singulair. Discussed ENT evaluation and she agreed with the plan. Referral placed.

## 2022-04-10 NOTE — Assessment & Plan Note (Signed)
Her A1C increased to 5.8 but she is still at goal. Continue diet control for now. Repeat A1C in 3-4 months.

## 2022-04-10 NOTE — Assessment & Plan Note (Addendum)
Now recovery. ED note, lab and imaging reviewed. As discussed with her, she may feel tired for a few weeks. Continue good hydration and graduated exercise. Note that the CTA done in the ED showed possible right ventricular dysfunction. Given her fatigue and SOB with exertion, ECHO was ordered to further evaluate this. I will contact her soon with her results. F/U as needed.

## 2022-04-10 NOTE — Progress Notes (Signed)
    SUBJECTIVE:   CHIEF COMPLAINT / HPI:   ED/COVID:  The patient is recovering from a COVID-19 infection, for which she was evaluated at the ED. Her cough symptoms have resolved, but she is still pretty tired.  DM/Weight management: She is compliant with diet modification. She d/ced her Wegovy for weight loss. No new concerns. Here for A1C check. She gained 5 lbs since she d/ced her Wegovy.  Sinus drainage: Ongoing for about a year since her first episode of COVID-19 infection. She tried Flonase and Singulair with no improvement. Also on Xyzal. She want this to be evaluated.   Itchy rash:  C/O itchy rash on her left upper back x 2 months. She uses hydrocortisone cream daily with no improvement. No known trigger.   PERTINENT  PMH / PSH: PMHx reviewed  OBJECTIVE:   BP 137/85   Ht '5\' 4"'$  (1.626 m)   Wt 171 lb 4 oz (77.7 kg)   LMP  (LMP Unknown)   BMI 29.39 kg/m   Physical Exam Vitals reviewed.  Cardiovascular:     Rate and Rhythm: Normal rate and regular rhythm.     Heart sounds: Normal heart sounds. No murmur heard. Pulmonary:     Effort: Pulmonary effort is normal. No respiratory distress.     Breath sounds: Normal breath sounds. No wheezing.  Musculoskeletal:     Right lower leg: No edema.     Left lower leg: No edema.     Comments: Sensory exam of the foot is normal, tested with the monofilament. Good pulses, no lesions or ulcers, good peripheral pulses.       ASSESSMENT/PLAN:   COVID-19 virus infection Now recovery. ED note, lab and imaging reviewed. As discussed with her, she may feel tired for a few weeks. Continue good hydration and graduated exercise. Note that the CTA done in the ED showed possible right ventricular dysfunction. Given her fatigue and SOB with exertion, ECHO was ordered to further evaluate this. I will contact her soon with her results. F/U as needed.  DM2 (diabetes mellitus, type 2) (HCC) Her A1C increased to 5.8 but she is still at  goal. Continue diet control for now. Repeat A1C in 3-4 months.   Overweight (BMI 25.0-29.9) She picked up a few pounds from Nov 2023 till date. She was in the 165s, but now in the 170 lbs. She will continue diet and exercise for now. Consider restarting Wegovy.  Chronic rhinitis Failed trial of Xyzal, Flonase and Singulair. Discussed ENT evaluation and she agreed with the plan. Referral placed.   Dermatitis Likely contact dermatitis. D/C Hydrocortisone and trial Betamethasone F/U in 2 weeks for skin biopsy if there is no improvement. She agreed with the plan.      Andrena Mews, MD [place]

## 2022-04-10 NOTE — Assessment & Plan Note (Signed)
She picked up a few pounds from Nov 2023 till date. She was in the 165s, but now in the 170 lbs. She will continue diet and exercise for now. Consider restarting Wegovy.

## 2022-04-10 NOTE — Assessment & Plan Note (Signed)
Likely contact dermatitis. D/C Hydrocortisone and trial Betamethasone F/U in 2 weeks for skin biopsy if there is no improvement. She agreed with the plan.

## 2022-04-11 ENCOUNTER — Other Ambulatory Visit (HOSPITAL_COMMUNITY): Payer: Self-pay

## 2022-04-11 ENCOUNTER — Other Ambulatory Visit: Payer: Self-pay

## 2022-04-11 MED ORDER — ROSUVASTATIN CALCIUM 40 MG PO TABS
40.0000 mg | ORAL_TABLET | Freq: Every day | ORAL | 1 refills | Status: DC
  Filled 2022-04-11: qty 90, 90d supply, fill #0
  Filled 2022-07-31 (×2): qty 90, 90d supply, fill #1

## 2022-04-11 MED ORDER — EZETIMIBE 10 MG PO TABS
10.0000 mg | ORAL_TABLET | Freq: Every day | ORAL | 1 refills | Status: DC
  Filled 2022-04-11: qty 90, 90d supply, fill #0
  Filled 2022-07-31 (×2): qty 90, 90d supply, fill #1

## 2022-04-23 ENCOUNTER — Ambulatory Visit (HOSPITAL_COMMUNITY)
Admission: RE | Admit: 2022-04-23 | Discharge: 2022-04-23 | Disposition: A | Discharge status: Home / Self Care | Payer: 59 | Source: Ambulatory Visit | Attending: Family Medicine | Admitting: Family Medicine

## 2022-04-23 DIAGNOSIS — E785 Hyperlipidemia, unspecified: Secondary | ICD-10-CM | POA: Insufficient documentation

## 2022-04-23 DIAGNOSIS — I10 Essential (primary) hypertension: Secondary | ICD-10-CM | POA: Insufficient documentation

## 2022-04-23 DIAGNOSIS — K219 Gastro-esophageal reflux disease without esophagitis: Secondary | ICD-10-CM | POA: Insufficient documentation

## 2022-04-23 DIAGNOSIS — R079 Chest pain, unspecified: Secondary | ICD-10-CM | POA: Diagnosis not present

## 2022-04-23 DIAGNOSIS — I5189 Other ill-defined heart diseases: Secondary | ICD-10-CM | POA: Insufficient documentation

## 2022-04-23 DIAGNOSIS — R008 Other abnormalities of heart beat: Secondary | ICD-10-CM | POA: Diagnosis not present

## 2022-04-23 DIAGNOSIS — I081 Rheumatic disorders of both mitral and tricuspid valves: Secondary | ICD-10-CM | POA: Insufficient documentation

## 2022-04-23 DIAGNOSIS — E119 Type 2 diabetes mellitus without complications: Secondary | ICD-10-CM | POA: Insufficient documentation

## 2022-04-23 DIAGNOSIS — G4730 Sleep apnea, unspecified: Secondary | ICD-10-CM | POA: Insufficient documentation

## 2022-04-23 LAB — ECHOCARDIOGRAM COMPLETE
Area-P 1/2: 3.77 cm2
Calc EF: 58.3 %
S' Lateral: 2.4 cm
Single Plane A2C EF: 59.1 %
Single Plane A4C EF: 58.5 %

## 2022-04-23 NOTE — Progress Notes (Signed)
Echocardiogram 2D Echocardiogram has been performed.  Fidel Levy 04/23/2022, 9:03 AM

## 2022-04-27 ENCOUNTER — Ambulatory Visit: Payer: 59 | Admitting: Family Medicine

## 2022-05-01 ENCOUNTER — Ambulatory Visit (INDEPENDENT_AMBULATORY_CARE_PROVIDER_SITE_OTHER): Payer: 59 | Admitting: Family Medicine

## 2022-05-01 ENCOUNTER — Other Ambulatory Visit (HOSPITAL_COMMUNITY): Payer: Self-pay

## 2022-05-01 ENCOUNTER — Encounter: Payer: Self-pay | Admitting: Family Medicine

## 2022-05-01 VITALS — BP 145/88 | HR 56 | Wt 180.0 lb

## 2022-05-01 DIAGNOSIS — M25819 Other specified joint disorders, unspecified shoulder: Secondary | ICD-10-CM | POA: Diagnosis not present

## 2022-05-01 DIAGNOSIS — R079 Chest pain, unspecified: Secondary | ICD-10-CM | POA: Diagnosis not present

## 2022-05-01 DIAGNOSIS — E669 Obesity, unspecified: Secondary | ICD-10-CM | POA: Diagnosis not present

## 2022-05-01 DIAGNOSIS — K219 Gastro-esophageal reflux disease without esophagitis: Secondary | ICD-10-CM | POA: Diagnosis not present

## 2022-05-01 DIAGNOSIS — Z6830 Body mass index (BMI) 30.0-30.9, adult: Secondary | ICD-10-CM | POA: Diagnosis not present

## 2022-05-01 DIAGNOSIS — R5383 Other fatigue: Secondary | ICD-10-CM | POA: Diagnosis not present

## 2022-05-01 MED ORDER — PANTOPRAZOLE SODIUM 20 MG PO TBEC
20.0000 mg | DELAYED_RELEASE_TABLET | Freq: Every day | ORAL | 1 refills | Status: DC
  Filled 2022-05-01: qty 30, 30d supply, fill #0
  Filled 2022-05-29: qty 30, 30d supply, fill #1

## 2022-05-01 MED ORDER — SEMAGLUTIDE-WEIGHT MANAGEMENT 0.25 MG/0.5ML ~~LOC~~ SOAJ
0.2500 mg | SUBCUTANEOUS | 1 refills | Status: DC
  Filled 2022-05-01: qty 2, 28d supply, fill #0
  Filled 2022-05-24: qty 2, 28d supply, fill #1

## 2022-05-01 NOTE — Assessment & Plan Note (Signed)
Likely long COVID-19 symptoms. Recent labs were normal.  Monitor for improvement. We will obtain repeat lab if no improvement. She agreed with the plan.

## 2022-05-01 NOTE — Assessment & Plan Note (Signed)
Off Wegovy. She'd like to resume her Wegovy. Plan to start at 0.25 mg weekly and titrate off as she has been off for more than 2 - 3 months. Continue lifestyle modification as well. F/U in 4 weeks for reassessment.

## 2022-05-01 NOTE — Assessment & Plan Note (Signed)
Atypical Currently asymptomatic. ECHO report reviewed and discussed with her. Cardiology evaluation for stress test discussed, but she declined. Likely related to GERD vs esophageal dysmotility. H pylori testing done today. Start PPI. Referral to GI if no improvement. She agreed with the plan.

## 2022-05-01 NOTE — Assessment & Plan Note (Signed)
Benight appearing with no signs of infection or inflammation. Management discussed - conservative vs surgical removal. She opted for conservative management for now. F/U if worsening.

## 2022-05-01 NOTE — Progress Notes (Signed)
    SUBJECTIVE:   CHIEF COMPLAINT / HPI:   Chest pain: C/O epigastric pain worsening over 1-2 months. She denies radiation. No SOB. However, she does have associated reflux-like symptoms and mild difficulty with swallowing. This is similar to her symptoms of esophageal stenosis she had many years s/p esophageal dilation procedure x 2. She endorses intermittent constipation. Otherwise, no other GI symptoms.   Fatigue/COVID-19 infection: She remains fatigued after COVID-19 infection.  Cyst: C/O knot on her left shoulder, which has been going on for > 6 months. This has progressively increased in size. She denies pain or redness. No other concerns.  Groin boil: C/O small boils are popping up in her groin area. This will resolve with good hygiene and then pop up somewhere else. Symptoms are currently improved.  Weight management: She started gaining weight after d/cing her Wegovy. She'd like to get back on this.  PERTINENT  PMH / PSH: PMHx reviewed  OBJECTIVE:   Vitals:   05/01/22 0935 05/01/22 0952  BP: (!) 150/95 (!) 145/88  Pulse: (!) 57 (!) 56  Weight: 180 lb (81.6 kg)     Physical Exam Vitals and nursing note reviewed.  Constitutional:      Appearance: She is not ill-appearing.  Cardiovascular:     Rate and Rhythm: Normal rate and regular rhythm.     Pulses: Normal pulses.     Heart sounds: Normal heart sounds. No murmur heard. Pulmonary:     Effort: Pulmonary effort is normal. No respiratory distress.     Breath sounds: Normal breath sounds. No wheezing.  Abdominal:     General: Abdomen is flat. Bowel sounds are normal. There is no distension.     Palpations: There is no mass.     Tenderness: There is no abdominal tenderness.  Genitourinary:    Comments: She declined GU/groin exam Musculoskeletal:     Comments: Firm, raised, mobile nodular mass, on her left shoulder (within her trapezius muscle) 2-3 cm in size  Neurological:     Mental Status: She is alert.       ASSESSMENT/PLAN:   Chest pain Atypical Currently asymptomatic. ECHO report reviewed and discussed with her. Cardiology evaluation for stress test discussed, but she declined. Likely related to GERD vs esophageal dysmotility. H pylori testing done today. Start PPI. Referral to GI if no improvement. She agreed with the plan.    Fatigue Likely long COVID-19 symptoms. Recent labs were normal.  Monitor for improvement. We will obtain repeat lab if no improvement. She agreed with the plan.   Cyst of joint of shoulder Benight appearing with no signs of infection or inflammation. Management discussed - conservative vs surgical removal. She opted for conservative management for now. F/U if worsening.   Obese Off Wegovy. She'd like to resume her Wegovy. Plan to start at 0.25 mg weekly and titrate off as she has been off for more than 2 - 3 months. Continue lifestyle modification as well. F/U in 4 weeks for reassessment.     Groin boil - likely ingrown hair. She declined evaluation at this time. F/U soon if worsening.   She declined COVID-19 vaccination.   Andrena Mews, MD [place]

## 2022-05-01 NOTE — Patient Instructions (Signed)
Semaglutide Injection (Weight Management) What is this medication? SEMAGLUTIDE (SEM a GLOO tide) promotes weight loss. It may also be used to maintain weight loss. It works by decreasing appetite. Changes to diet and exercise are often combined with this medication. This medicine may be used for other purposes; ask your health care provider or pharmacist if you have questions. COMMON BRAND NAME(S): [id number] What should I tell my care team before I take this medication? They need to know if you have any of these conditions: Endocrine tumors (MEN 2) or if someone in your family had these tumors Eye disease, vision problems Gallbladder disease History of depression or mental health disease History of pancreatitis Kidney disease Stomach or intestine problems Suicidal thoughts, plans, or attempt; a previous suicide attempt by you or a family member Thyroid cancer or if someone in your family had thyroid cancer An unusual or allergic reaction to semaglutide, other medications, foods, dyes, or preservatives Pregnant or trying to get pregnant Breast-feeding How should I use this medication? This medication is injected under the skin. You will be taught how to prepare and give it. Take it as directed on the prescription label. It is given once every week (every 7 days). Keep taking it unless your care team tells you to stop. It is important that you put your used needles and pens in a special sharps container. Do not put them in a trash can. If you do not have a sharps container, call your pharmacist or care team to get one. A special MedGuide will be given to you by the pharmacist with each prescription and refill. Be sure to read this information carefully each time. This medication comes with INSTRUCTIONS FOR USE. Ask your pharmacist for directions on how to use this medication. Read the information carefully. Talk to your pharmacist or care team if you have questions. Talk to your care team about  the use of this medication in children. While it may be prescribed for children as young as 12 years for selected conditions, precautions do apply. Overdosage: If you think you have taken too much of this medicine contact a poison control center or emergency room at once. NOTE: This medicine is only for you. Do not share this medicine with others. What if I miss a dose? If you miss a dose and the next scheduled dose is more than 2 days away, take the missed dose as soon as possible. If you miss a dose and the next scheduled dose is less than 2 days away, do not take the missed dose. Take the next dose at your regular time. Do not take double or extra doses. If you miss your dose for 2 weeks or more, take the next dose at your regular time or call your care team to talk about how to restart this medication. What may interact with this medication? Insulin and other medications for diabetes This list may not describe all possible interactions. Give your health care provider a list of all the medicines, herbs, non-prescription drugs, or dietary supplements you use. Also tell them if you smoke, drink alcohol, or use illegal drugs. Some items may interact with your medicine. What should I watch for while using this medication? Visit your care team for regular checks on your progress. It may be some time before you see the benefit from this medication. Drink plenty of fluids while taking this medication. Check with your care team if you have severe diarrhea, nausea, and vomiting, or if you sweat a  lot. The loss of too much body fluid may make it dangerous for you to take this medication. This medication may affect blood sugar levels. Ask your care team if changes in diet or medications are needed if you have diabetes. If you or your family notice any changes in your behavior, such as new or worsening depression, thoughts of harming yourself, anxiety, other unusual or disturbing thoughts, or memory loss, call  your care team right away. Women should inform their care team if they wish to become pregnant or think they might be pregnant. Losing weight while pregnant is not advised and may cause harm to the unborn child. Talk to your care team for more information. What side effects may I notice from receiving this medication? Side effects that you should report to your care team as soon as possible: Allergic reactions--skin rash, itching, hives, swelling of the face, lips, tongue, or throat Change in vision Dehydration--increased thirst, dry mouth, feeling faint or lightheaded, headache, dark yellow or brown urine Gallbladder problems--severe stomach pain, nausea, vomiting, fever Heart palpitations--rapid, pounding, or irregular heartbeat Kidney injury--decrease in the amount of urine, swelling of the ankles, hands, or feet Pancreatitis--severe stomach pain that spreads to your back or gets worse after eating or when touched, fever, nausea, vomiting Thoughts of suicide or self-harm, worsening mood, feelings of depression Thyroid cancer--new mass or lump in the neck, pain or trouble swallowing, trouble breathing, hoarseness Side effects that usually do not require medical attention (report to your care team if they continue or are bothersome): Diarrhea Loss of appetite Nausea Stomach pain Vomiting This list may not describe all possible side effects. Call your doctor for medical advice about side effects. You may report side effects to FDA at 1-800-FDA-1088. Where should I keep my medication? Keep out of the reach of children and pets. Refrigeration (preferred): Store in the refrigerator. Do not freeze. Keep this medication in the original container until you are ready to take it. Get rid of any unused medication after the expiration date. Room temperature: If needed, prior to cap removal, the pen can be stored at room temperature for up to 28 days. Protect from light. If it is stored at room  temperature, get rid of any unused medication after 28 days or after it expires, whichever is first. It is important to get rid of the medication as soon as you no longer need it or it is expired. You can do this in two ways: Take the medication to a medication take-back program. Check with your pharmacy or law enforcement to find a location. If you cannot return the medication, follow the directions in the [place]. NOTE: This sheet is a summary. It may not cover all possible information. If you have questions about this medicine, talk to your doctor, pharmacist, or health care provider.  2023 Elsevier/Gold Standard (2020-05-19 00:00:00)

## 2022-05-03 LAB — H. PYLORI BREATH TEST: H pylori Breath Test: NEGATIVE

## 2022-05-08 ENCOUNTER — Other Ambulatory Visit: Payer: Self-pay | Admitting: Family Medicine

## 2022-05-08 ENCOUNTER — Other Ambulatory Visit (HOSPITAL_COMMUNITY): Payer: Self-pay

## 2022-05-08 MED ORDER — LEVOCETIRIZINE DIHYDROCHLORIDE 5 MG PO TABS
5.0000 mg | ORAL_TABLET | Freq: Every evening | ORAL | 1 refills | Status: DC
  Filled 2022-05-08: qty 90, 90d supply, fill #0
  Filled 2022-07-31 (×2): qty 90, 90d supply, fill #1

## 2022-05-10 ENCOUNTER — Telehealth: Payer: Self-pay | Admitting: Internal Medicine

## 2022-05-10 NOTE — Telephone Encounter (Signed)
Patient called having issues with dysphagia seeking advise.

## 2022-05-10 NOTE — Telephone Encounter (Signed)
Pt stated that recently she has been having more difficulty swallowing. Having increased burning in her chest. Pt scheduled for an office visit on 05/25/2022 at 1:30 PM with Vicie Mutters PA. Pt made aware. Pt verbalized understanding with all questions answered.

## 2022-05-15 DIAGNOSIS — Z86010 Personal history of colonic polyps: Secondary | ICD-10-CM | POA: Insufficient documentation

## 2022-05-15 DIAGNOSIS — Z860101 Personal history of adenomatous and serrated colon polyps: Secondary | ICD-10-CM

## 2022-05-15 HISTORY — DX: Personal history of adenomatous and serrated colon polyps: Z86.0101

## 2022-05-15 HISTORY — DX: Personal history of colonic polyps: Z86.010

## 2022-05-24 NOTE — Progress Notes (Addendum)
05/25/2022 [name] [place] [id number] 09/16/1960  Referring provider: [name], MD Primary GI doctor: Dr. Carlean Purl  ASSESSMENT AND PLAN:   Esophageal dysphagia with hiatal hernia and GERD Last EGD with dilatation 2018 Worsening discomfort and swallowing after COVID Normal cardiac work up EGD with dilatation to evaluate for stenosis, tumor, erosive/infectious esophagititis, and EOE.   If the EGD is negative and symptoms continue after PPI trial, can consider barium swallow and/or esophageal manometry. Continue PPI daily, add pepcid at night Stop NSAIDS Consider stopping GLP1, will hold prior to EGD  Chronic idiopathic constipation Worsening constipation Has failed miralax Due for colonoscopy 11/2022 After further discussion with patient, will schedule colonoscopy sooner for constipation with EGD.   I recommend upper gastrointestinal and colorectal evaluation with an EGD and colonoscopy.  Risk of bowel prep, conscious sedation, and EGD and colonoscopy were discussed.  Risks include but are not limited to dehydration, pain, bleeding, cardiopulmonary process, bowel perforation, or other possible adverse outcomes..  Treatment plan was discussed with patient, and agreed upon.   Patient Care Team: [name], MD as PCP - General (Family Medicine) Pieter Partridge, DO as Consulting Physician (Neurology) Yisroel Ramming, Everardo All, MD as Consulting Physician (Obstetrics and Gynecology)  HISTORY OF PRESENT ILLNESS: 62 y.o. 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   12/10/2012 colonoscopy with Dr. Penelope Coop in [facility] unremarkable other than internal hemorrhoids recall 10 years 07/18/2015 EGD - Benign-appearing esophageal stenosis. Dilated. - LA Grade B reflux esophagitis. Biopsied. - Small  hiatal hernia. - The examination was otherwise normal. 09/12/2016 EGD - Moderate Schatzki ring. Dilated. - 2 cm hiatal hernia. - The examination was otherwise normal. - No specimens collected.  04/23/2022 echocardiogram EF 65%, no aortic stenosis  Dec patient started to note dysphagia with any solids, worse bread, will have to drink water to push it down.  Carbonated beverages makes her feel she will gag.  Increased eructation.  Had + COVID 01/10.24 with chest pain, had normal CTA, normal echocardiogram.  Has had some worsening GERD since that time.  She has been on protonix and having some improvement.  She is not on NSAIDS but takes diclofenac PRN, rare wine.  She has had constipation for years. She has BM every 4-5 days, some AB pain with it, getting progressively worse.   No melena, no hematochezia.    She  reports that she quit smoking about 42 years ago. Her smoking use included cigarettes. She started smoking about 43 years ago. She has a 0.01 pack-year smoking history. She has never used smokeless tobacco. She reports that she does not drink alcohol and does not use drugs.  RELEVANT LABS AND IMAGING: CBC    Component Value Date/Time   WBC 5.9 03/28/2022 1017   RBC 4.48 03/28/2022 1017   HGB 12.9 03/28/2022 1017   HGB 12.4 06/07/2020 0954   HGB 12.1 10/14/2013 1123   HCT 40.9 03/28/2022 1017   HCT 37.0 06/07/2020 0954   PLT 270 03/28/2022 1017   PLT 353 06/07/2020 0954   MCV 91.3 03/28/2022 1017   MCV 86 06/07/2020 0954   MCH 28.8 03/28/2022 1017   MCHC 31.5 03/28/2022 1017   RDW 14.1 03/28/2022 1017   RDW 13.4 06/07/2020 0954   LYMPHSABS 3.1 06/07/2020 0954   MONOABS 0.5 04/30/2019 0857  EOSABS 0.2 06/07/2020 0954   BASOSABS 0.0 06/07/2020 0954   Recent Labs    03/28/22 1017  HGB 12.9     CMP     Component Value Date/Time   NA 141 03/28/2022 1017   NA 142 08/08/2021 0907   K 4.1 03/28/2022 1017   CL 106 03/28/2022 1017   CO2 26 03/28/2022 1017   GLUCOSE  99 03/28/2022 1017   BUN <5 (L) 03/28/2022 1017   BUN 8 08/08/2021 0907   CREATININE 0.80 03/28/2022 1017   CREATININE 0.92 07/04/2017 0909   CALCIUM 8.8 (L) 03/28/2022 1017   PROT 7.3 08/08/2021 0907   ALBUMIN 4.6 08/08/2021 0907   AST 19 08/08/2021 0907   ALT 19 08/08/2021 0907   ALKPHOS 101 08/08/2021 0907   BILITOT 0.3 08/08/2021 0907   GFRNONAA >60 03/28/2022 1017   GFRNONAA 70 07/04/2017 0909   GFRAA 65 04/26/2020 0920   GFRAA 81 07/04/2017 0909      Latest Ref Rng & Units 08/08/2021    9:07 AM 04/26/2020    9:20 AM 04/30/2019    8:57 AM  Hepatic Function  Total Protein 6.0 - 8.5 g/dL 7.3  7.0  7.2   Albumin 3.8 - 4.9 g/dL 4.6  4.2  3.8   AST 0 - 40 IU/L '19  17  16   '$ ALT 0 - 32 IU/L '19  12  15   '$ Alk Phosphatase 44 - 121 IU/L 101  103  88   Total Bilirubin 0.0 - 1.2 mg/dL 0.3  <0.2  0.5       Current Medications:   Current Outpatient Medications (Endocrine & Metabolic):    estradiol (VIVELLE-DOT) 0.05 MG/24HR patch, Place 1 patch (0.05 mg total) onto the skin 2 (two) times a week.  Current Outpatient Medications (Cardiovascular):    ezetimibe (ZETIA) 10 MG tablet, Take 1 tablet (10 mg total) by mouth daily.   propranolol (INDERAL) 20 MG tablet, Take 1 tablet (20 mg total) by mouth 2 (two) times daily.   rosuvastatin (CRESTOR) 40 MG tablet, Take 1 tablet (40 mg total) by mouth at bedtime.  Current Outpatient Medications (Respiratory):    fluticasone (FLONASE) 50 MCG/ACT nasal spray, Place 1-2 sprays into both nostrils daily.   levocetirizine (XYZAL) 5 MG tablet, Take 1 tablet (5 mg total) by mouth every evening.   montelukast (SINGULAIR) 10 MG tablet, Take 1 tablet (10 mg total) by mouth at bedtime.  Current Outpatient Medications (Analgesics):    diclofenac (VOLTAREN) 75 MG EC tablet, Take 1 tablet (75 mg total) by mouth 2 (two) times daily as needed.   Current Outpatient Medications (Other):    betamethasone dipropionate 0.05 % cream, Apply topically 2 (two)  times daily.   blood glucose meter kit and supplies, Dispense based on patient and insurance preference. Use once a day as directed. (FOR ICD-10 E10.9, E11.9).   Elastic Bandages & Supports (WRIST BRACE/LEFT MEDIUM) MISC, 1 each by Does not apply route as needed.   Estradiol (VAGIFEM) 10 MCG TABS vaginal tablet, Place 1 tablet (10 mcg total) vaginally 2 (two) times a week.   pantoprazole (PROTONIX) 20 MG tablet, Take 1 tablet (20 mg total) by mouth daily.   Semaglutide-Weight Management 0.25 MG/0.5ML SOAJ, Inject 0.25 mg into the skin once a week.  Medical History:  Past Medical History:  Diagnosis Date   Anxiety    Arthritis    neck , right knee > than left   Brain tumor (benign) ([place]) 2018  Chest pain [id number]   Complication of anesthesia    Depression    in the past   Diabetes mellitus without complication ([place])    Type II   Dizziness 04/26/2020   GERD (gastroesophageal reflux disease)    04/30/2019- "in the past"   H/O varicella    History of measles, mumps, or rubella    HSV-2 (herpes simplex virus 2) infection    Hx of ovarian cyst    Hyperlipidemia    Hypersomnia 07/19/2015   Hypertension    "sometimes"    Migraine headache    "due to cervical issuses"   Persistent fatigue after COVID-19    Plantar fasciitis    PONV (postoperative nausea and vomiting)    Scapular dyskinesis 10/03/2016   Sleep apnea    pt denies   Sleep deprivation    loss of sleep   VAIN I (vaginal intraepithelial neoplasia grade I) 2013   pap and confirmed by colposcopic biopsy   Allergies:  Allergies  Allergen Reactions   Sulfa Antibiotics Anaphylaxis, Hives and Swelling     Surgical History:  She  has a past surgical history that includes Abdominal hysterectomy; Cholecystectomy (11/13/10); Dilation and curettage of uterus; Tubal ligation; Colonoscopy (Multiple); Oophorectomy (2009); Esophagogastroduodenoscopy; Anterior cervical decomp/discectomy fusion (N/A, 05/07/2019); and Shoulder arthroscopy  with rotator cuff repair (Right, 08/03/2019). Family History:  Her family history includes Alcohol abuse in her brother and brother; Anemia in her mother; Aneurysm in her brother; Breast cancer (age of onset: 48) in her sister; Drug abuse in her brother; Heart disease in her sister; Hypertension in her mother; Hypotension in her mother; Sickle cell trait in an other family member.  REVIEW OF SYSTEMS  : All other systems reviewed and negative except where noted in the History of Present Illness.  PHYSICAL EXAM: BP 122/80   Pulse 79   Ht '5\' 5"'$  (1.651 m)   Wt 177 lb (80.3 kg)   LMP  (LMP Unknown)   SpO2 98%   BMI 29.45 kg/m  General Appearance: Well nourished, in no apparent distress. Head:   Normocephalic and atraumatic. Eyes:  sclerae anicteric,conjunctive pink  Respiratory: Respiratory effort normal, BS equal bilaterally without rales, rhonchi, wheezing. Cardio: RRR with no MRGs. Peripheral pulses intact.  Abdomen: Soft,  Obese ,active bowel sounds. No tenderness . Without guarding and Without rebound. No masses. Rectal: Not evaluated Musculoskeletal: Full ROM, Normal gait. Without edema. Skin:  Dry and intact without significant lesions or rashes Neuro: Alert and  oriented x4;  No focal deficits. Psych:  Cooperative. Normal mood and affect.    Vladimir Crofts, PA-C 2:06 PM

## 2022-05-25 ENCOUNTER — Encounter: Payer: Self-pay | Admitting: Physician Assistant

## 2022-05-25 ENCOUNTER — Ambulatory Visit: Payer: 59 | Admitting: Physician Assistant

## 2022-05-25 VITALS — BP 122/80 | HR 79 | Ht 65.0 in | Wt 177.0 lb

## 2022-05-25 DIAGNOSIS — K5904 Chronic idiopathic constipation: Secondary | ICD-10-CM | POA: Diagnosis not present

## 2022-05-25 DIAGNOSIS — K449 Diaphragmatic hernia without obstruction or gangrene: Secondary | ICD-10-CM

## 2022-05-25 DIAGNOSIS — K219 Gastro-esophageal reflux disease without esophagitis: Secondary | ICD-10-CM

## 2022-05-25 DIAGNOSIS — R1319 Other dysphagia: Secondary | ICD-10-CM

## 2022-05-25 NOTE — Patient Instructions (Addendum)
Dysphagia precautions:  1. Take reflux medications 30+ minutes before food in the morning 2. Begin meals with warm beverage 3. Eat smaller more frequent meals 4. Eat slowly, taking small bites and sips 5. Alternate solids and liquids 6. Avoid foods/liquids that increase acid production 7. Sit upright during and for 30+ minutes after meals to facilitate esophageal clearing 8. All meats should be chopped finely.   If something gets hung in your esophagus and will not come up or go down, proceed to the emergency room.    [place]  *IBS-C patients may begin to experience relief from belly pain and overall abdominal symptoms (pain, discomfort, and bloating) in about 1 week,  with symptoms typically improving over 12 weeks.  Take at least 30 minutes before the first meal of the day on an empty stomach You can have a loose stool if you eat a high-fat breakfast. Give it at least 7 days, may have more bowel movements during that time.   The diarrhea should go away and you should start having normal, complete, full bowel movements.  It may be helpful to start treatment when you can be near the comfort of your own bathroom, such as a weekend.  After you are out we can send in a prescription if you did well, there is a prescription card    Miralax is an osmotic laxative.  It only brings more water into the stool.  This is safe to take daily.  Can take up to 17 gram of miralax twice a day.  Mix with juice or coffee.  Start 1 capful at night for 3-4 days and reassess your response in 3-4 days.  You can increase and decrease the dose based on your response.  Remember, it can take up to 3-4 days to take effect OR for the effects to wear off.   I often pair this with benefiber in the morning to help assure the stool is not too loose.   Recommend starting on a fiber supplement, can try metamucil first but if this causes gas/bloating switch to benefiber or citracel, these do not cause gas.  Take  with fiber with with a full 8 oz glass of water once a day. This can take 1 month to start helping, so try for at least one month.  Recommend increasing water and physical activity.   - Drink at least 64-80 ounces of water/liquid per day. - Establish a time to try to move your bowels every day.  For many people, this is after a cup of coffee or after a meal such as breakfast. - Sit all of the way back on the toilet keeping your back fairly straight and while sitting up, try to rest the tops of your forearms on your upper thighs.   - Raising your feet with a step stool/squatty potty can be helpful to improve the angle that allows your stool to pass through the rectum. - Relax the rectum feeling it bulge toward the toilet water.  If you feel your rectum raising toward your body, you are contracting rather than relaxing. - Breathe in and slowly exhale. "Belly breath" by expanding your belly towards your belly button. Keep belly expanded as you gently direct pressure down and back to the anus.  A low pitched GRRR sound can assist with increasing intra-abdominal pressure.  - Repeat 3-4 times. If unsuccessful, contract the pelvic floor to restore normal tone and get off the toilet.  Avoid excessive straining. - To reduce excessive wiping  by teaching your anus to normally contract, place hands on outer aspect of knees and resist knee movement outward.  Hold 5-10 second then place hands just inside of knees and resist inward movement of knees.  Hold 5 seconds.  Repeat a few times each way.  Go to the ER if unable to pass gas, severe AB pain, unable to hold down food, any shortness of breath of chest pain.  You have been scheduled for an endoscopy and colonoscopy. Please follow the written instructions given to you at your visit today. Please pick up your prep supplies at the pharmacy within the next 1-3 days. If you use inhalers (even only as needed), please bring them with you on the day of your procedure.    Due to recent changes in healthcare laws, you may see the results of your imaging and laboratory studies on MyChart before your provider has had a chance to review them.  We understand that in some cases there may be results that are confusing or concerning to you. Not all laboratory results come back in the same time frame and the provider may be waiting for multiple results in order to interpret others.  Please give [place] 48 hours in order for your provider to thoroughly review all the results before contacting the office for clarification of your results.    Thank You for choosing [facility]  Vicie Mutters, PA-C

## 2022-05-28 ENCOUNTER — Telehealth: Payer: Self-pay | Admitting: Physician Assistant

## 2022-05-28 NOTE — Telephone Encounter (Signed)
Inbound call from patient stating that she had rescheduled her procedure date and needs new prep instructions. Patient was rescheduled for March 27th at 1:30 and is requesting instructions to be sent to her Mychart and is requesting a call to be advised that they have been sent. Please advise.

## 2022-05-29 ENCOUNTER — Other Ambulatory Visit (HOSPITAL_COMMUNITY): Payer: Self-pay

## 2022-05-29 ENCOUNTER — Other Ambulatory Visit: Payer: Self-pay | Admitting: Student

## 2022-05-29 MED ORDER — FLUTICASONE PROPIONATE 50 MCG/ACT NA SUSP
2.0000 | Freq: Every day | NASAL | 12 refills | Status: DC
  Filled 2022-05-29: qty 16, 30d supply, fill #0
  Filled 2022-07-31 (×2): qty 16, 30d supply, fill #1

## 2022-05-30 ENCOUNTER — Encounter: Payer: Self-pay | Admitting: *Deleted

## 2022-05-30 NOTE — Telephone Encounter (Signed)
Called patient to inform her new instructions sent

## 2022-06-13 ENCOUNTER — Encounter: Payer: Self-pay | Admitting: Internal Medicine

## 2022-06-13 ENCOUNTER — Ambulatory Visit (AMBULATORY_SURGERY_CENTER): Payer: 59 | Admitting: Internal Medicine

## 2022-06-13 ENCOUNTER — Ambulatory Visit: Payer: 59 | Admitting: Gastroenterology

## 2022-06-13 ENCOUNTER — Other Ambulatory Visit (HOSPITAL_COMMUNITY): Payer: Self-pay

## 2022-06-13 VITALS — BP 131/86 | HR 61 | Temp 98.5°F | Resp 17 | Ht 65.0 in | Wt 177.0 lb

## 2022-06-13 DIAGNOSIS — D125 Benign neoplasm of sigmoid colon: Secondary | ICD-10-CM | POA: Diagnosis not present

## 2022-06-13 DIAGNOSIS — K219 Gastro-esophageal reflux disease without esophagitis: Secondary | ICD-10-CM | POA: Diagnosis not present

## 2022-06-13 DIAGNOSIS — K222 Esophageal obstruction: Secondary | ICD-10-CM

## 2022-06-13 DIAGNOSIS — R1310 Dysphagia, unspecified: Secondary | ICD-10-CM | POA: Diagnosis not present

## 2022-06-13 DIAGNOSIS — R1319 Other dysphagia: Secondary | ICD-10-CM

## 2022-06-13 DIAGNOSIS — K5904 Chronic idiopathic constipation: Secondary | ICD-10-CM | POA: Diagnosis not present

## 2022-06-13 MED ORDER — SODIUM CHLORIDE 0.9 % IV SOLN: 500.0000 mL | Freq: Once | INTRAVENOUS | Status: DC

## 2022-06-13 MED ORDER — PANTOPRAZOLE SODIUM 40 MG PO TBEC
40.0000 mg | DELAYED_RELEASE_TABLET | Freq: Every day | ORAL | 3 refills | Status: DC
  Filled 2022-06-13 (×2): qty 90, 90d supply, fill #0
  Filled 2022-09-11: qty 90, 90d supply, fill #1

## 2022-06-13 MED ORDER — LUBIPROSTONE 8 MCG PO CAPS
8.0000 ug | ORAL_CAPSULE | Freq: Two times a day (BID) | ORAL | 0 refills | Status: DC
  Filled 2022-06-13: qty 60, 30d supply, fill #0

## 2022-06-13 NOTE — Patient Instructions (Addendum)
I dilated the esophagus again today - stricture seen as before. I am changing pantoprazole to 40 mg daily as a higher dose can reduce recurrence of the stricture.  Colonoscopy revealed one small polyp that looks benign - snared off. I will let you know pathology results and when to have another routine colonoscopy by mail and/or My Chart.  All else ok in colon.  We can consider other medications for constipation and consider specialized PT - will discuss. HAVE PRESCRIBED AMITIZA -( LUBIPROSTONE )- LINZESS IS NOT COVERED FOR YOU  I appreciate the opportunity to care for you. Gatha Mayer, MD, FACG      YOU HAD AN ENDOSCOPIC PROCEDURE TODAY AT [facility]:   Refer to the procedure report that was given to you for any specific questions about what was found during the examination.  If the procedure report does not answer your questions, please call your gastroenterologist to clarify.  If you requested that your care partner not be given the details of your procedure findings, then the procedure report has been included in a sealed envelope for you to review at your convenience later.  YOU SHOULD EXPECT: Some feelings of bloating in the abdomen. Passage of more gas than usual.  Walking can help get rid of the air that was put into your GI tract during the procedure and reduce the bloating. If you had a lower endoscopy (such as a colonoscopy or flexible sigmoidoscopy) you may notice spotting of blood in your stool or on the toilet paper. If you underwent a bowel prep for your procedure, you may not have a normal bowel movement for a few days.  Please Note:  You might notice some irritation and congestion in your nose or some drainage.  This is from the oxygen used during your procedure.  There is no need for concern and it should clear up in a day or so.  SYMPTOMS TO REPORT IMMEDIATELY:  Following lower endoscopy (colonoscopy or flexible sigmoidoscopy):  Excessive amounts of  blood in the stool  Significant tenderness or worsening of abdominal pains  Swelling of the abdomen that is new, acute  Fever of 100F or higher  Following upper endoscopy (EGD)  Vomiting of blood or coffee ground material  New chest pain or pain under the shoulder blades  Painful or persistently difficult swallowing  New shortness of breath  Fever of 100F or higher  Black, tarry-looking stools  For urgent or emergent issues, a gastroenterologist can be reached at any hour by calling [phone]. Do not use MyChart messaging for urgent concerns.    DIET:  We do recommend a small meal at first, but then you may proceed to your regular diet.  Drink plenty of fluids but you should avoid alcoholic beverages for 24 hours.  ACTIVITY:  You should plan to take it easy for the rest of today and you should NOT DRIVE or use heavy machinery until tomorrow (because of the sedation medicines used during the test).    FOLLOW UP: Our staff will call the number listed on your records the next business day following your procedure.  We will call around 7:15- 8:00 am to check on you and address any questions or concerns that you may have regarding the information given to you following your procedure. If we do not reach you, we will leave a message.     If any biopsies were taken you will be contacted by phone or by letter within the  next 1-3 weeks.  Please call us at [phone] if you have not heard about the biopsies in 3 weeks.    SIGNATURES/CONFIDENTIALITY: You and/or your care partner have signed paperwork which will be entered into your electronic medical record.  These signatures attest to the fact that that the information above on your After Visit Summary has been reviewed and is understood.  Full responsibility of the confidentiality of this discharge information lies with you and/or your care-partner.

## 2022-06-13 NOTE — Op Note (Signed)
[place] Patient Name: [name] Procedure Date: 06/13/2022 1:36 PM MRN: [id number] Endoscopist: Gatha Mayer , MD, [id number] Age: 62 Referring MD:  Date of Birth: 1961-01-12 Gender: Female Account #: [id number] Procedure:                Upper GI endoscopy Indications:              Dysphagia Medicines:                Monitored Anesthesia Care Procedure:                Pre-Anesthesia Assessment:                           - Prior to the procedure, a History and Physical                            was performed, and patient medications and                            allergies were reviewed. The patient's tolerance of                            previous anesthesia was also reviewed. The risks                            and benefits of the procedure and the sedation                            options and risks were discussed with the patient.                            All questions were answered, and informed consent                            was obtained. Prior Anticoagulants: The patient has                            taken no anticoagulant or antiplatelet agents. ASA                            Grade Assessment: III - A patient with severe                            systemic disease. After reviewing the risks and                            benefits, the patient was deemed in satisfactory                            condition to undergo the procedure.                           After obtaining informed consent, the endoscope was  passed under direct vision. Throughout the                            procedure, the patient's blood pressure, pulse, and                            oxygen saturations were monitored continuously. The                            Olympus Scope [id number] was introduced through the                            mouth, and advanced to the second part of duodenum.                            The upper GI endoscopy was  accomplished without                            difficulty. The patient tolerated the procedure                            well. Scope In: Scope Out: Findings:                 One benign-appearing, intrinsic moderate                            (circumferential scarring or stenosis; an endoscope                            may pass) stenosis was found at the                            gastroesophageal junction. This stenosis measured                            less than one cm (in length). The stenosis was                            traversed. A TTS dilator was passed through the                            scope. Dilation with an 18-19-20 mm balloon dilator                            was performed to 20 mm. The dilation site was                            examined and showed mild mucosal disruption.                            Estimated blood loss was minimal.                           The exam was otherwise  without abnormality.                           The cardia and gastric fundus were normal on                            retroflexion. Complications:            No immediate complications. Estimated Blood Loss:     Estimated blood loss was minimal. Impression:               - Benign-appearing esophageal stenosis. Dilated.                           - The examination was otherwise normal.                           - No specimens collected. Recommendation:           - Patient has a contact number available for                            emergencies. The signs and symptoms of potential                            delayed complications were discussed with the                            patient. Return to normal activities tomorrow.                            Written discharge instructions were provided to the                            patient.                           - Clear liquids x 1 hour then soft foods rest of                            day. Start prior diet tomorrow.                            - Continue present medications.                           - Change pantoprazole from 20 to 40 mg daily to                            reduce risk of recurrent stricture. Gatha Mayer, MD 06/13/2022 2:19:24 PM This report has been signed electronically.

## 2022-06-13 NOTE — Progress Notes (Signed)
Called to room to assist during endoscopic procedure.  Patient ID and intended procedure confirmed with present staff. Received instructions for my participation in the procedure from the performing physician.  

## 2022-06-13 NOTE — Progress Notes (Signed)
Pt's states no medical or surgical changes since previsit or office visit. 

## 2022-06-13 NOTE — Op Note (Addendum)
[facility] Patient Name: [name] Procedure Date: 06/13/2022 1:36 PM MRN: [id number] Endoscopist: Gatha Mayer , MD, [id number] Age: 62 Referring MD:  Date of Birth: [birth year] Gender: Female Account #: [id number] Procedure:                Colonoscopy Indications:              Constipation Medicines:                Monitored Anesthesia Care Procedure:                Pre-Anesthesia Assessment:                           - Prior to the procedure, a History and Physical                            was performed, and patient medications and                            allergies were reviewed. The patient's tolerance of                            previous anesthesia was also reviewed. The risks                            and benefits of the procedure and the sedation                            options and risks were discussed with the patient.                            All questions were answered, and informed consent                            was obtained. Prior Anticoagulants: The patient has                            taken no anticoagulant or antiplatelet agents. ASA                            Grade Assessment: III - A patient with severe                            systemic disease. After reviewing the risks and                            benefits, the patient was deemed in satisfactory                            condition to undergo the procedure.                           - Prior to the procedure, a History and Physical  was performed, and patient medications and                            allergies were reviewed. The patient's tolerance of                            previous anesthesia was also reviewed. The risks                            and benefits of the procedure and the sedation                            options and risks were discussed with the patient.                            All questions were answered, and informed consent                             was obtained. Prior Anticoagulants: The patient has                            taken no anticoagulant or antiplatelet agents. ASA                            Grade Assessment: III - A patient with severe                            systemic disease. After reviewing the risks and                            benefits, the patient was deemed in satisfactory                            condition to undergo the procedure.                           After obtaining informed consent, the colonoscope                            was passed under direct vision. Throughout the                            procedure, the patient's blood pressure, pulse, and                            oxygen saturations were monitored continuously. The                            Olympus [id number]) Colonoscope was                            introduced through the anus and advanced to the the  cecum, identified by appendiceal orifice and                            ileocecal valve. The colonoscopy was performed                            without difficulty. The patient tolerated the                            procedure well. The quality of the bowel                            preparation was excellent. The ileocecal valve,                            appendiceal orifice, and rectum were photographed.                            The bowel preparation used was Miralax via split                            dose instruction. Scope In: 1:52:35 PM Scope Out: 2:09:43 PM Scope Withdrawal Time: 0 hours 14 minutes 0 seconds  Total Procedure Duration: 0 hours 17 minutes 8 seconds  Findings:                 The perianal and digital rectal examinations were                            normal.                           An 8 mm polyp was found in the sigmoid colon. The                            polyp was pedunculated. The polyp was removed with                            a hot  snare. Resection and retrieval were complete.                            Verification of patient identification for the                            specimen was done. Estimated blood loss: none.                           The exam was otherwise without abnormality on                            direct and retroflexion views. Complications:            No immediate complications. Estimated Blood Loss:     Estimated blood loss: none. Impression:               - One 8 mm  polyp in the sigmoid colon, removed with                            a hot snare. Resected and retrieved.                           - The examination was otherwise normal on direct                            and retroflexion views. Recommendation:           - Patient has a contact number available for                            emergencies. The signs and symptoms of potential                            delayed complications were discussed with the                            patient. Return to normal activities tomorrow.                            Written discharge instructions were provided to the                            patient.                           - Clear liquids x 1 hour then soft foods rest of                            day. Start prior diet tomorrow.                           - Continue present medications.                           - See the other procedure note for documentation of                            additional recommendations.                           - Repeat colonoscopy is recommended. The                            colonoscopy date will be determined after pathology                            results from today's exam become available for                            review.                           -  She has not responded to fiber or MiraLax -                            considerr other Rx vs PT - will discuss - WILL TRY                            AMITIZA - SHE ACTUALLY HAD LINZESS SAMPLES (? DOSE)                             THAT HELPED BUT NOT FORMULARY                           - No aspirin, ibuprofen, naproxen, or other                            non-steroidal anti-inflammatory drugs for 2 weeks                            after polyp removal. Gatha Mayer, MD 06/13/2022 2:22:55 PM This report has been signed electronically.

## 2022-06-13 NOTE — Progress Notes (Signed)
History and Physical Interval Note:  06/13/2022 1:25 PM  [name]  has presented today for endoscopic procedure(s), with the diagnosis of  Encounter Diagnoses  Name Primary?   Esophageal dysphagia Yes   Chronic idiopathic constipation   .  The various methods of evaluation and treatment have been discussed with the patient and/or family. After consideration of risks, benefits and other options for treatment, the patient has consented to  the endoscopic procedure(s).   The patient's history has been reviewed, patient examined, no change in status, stable for endoscopic procedure(s).  I have reviewed the patient's chart and labs.  Questions were answered to the patient's satisfaction.     Gatha Mayer, MD, [facility]

## 2022-06-13 NOTE — Progress Notes (Signed)
Vss nad trans to pacu 

## 2022-06-14 ENCOUNTER — Telehealth: Payer: Self-pay

## 2022-06-14 ENCOUNTER — Other Ambulatory Visit: Payer: Self-pay

## 2022-06-14 NOTE — Telephone Encounter (Signed)
  Follow up Call-     06/13/2022    1:13 PM  Call back number  Post procedure Call Back phone  # [phone]  Permission to leave phone message Yes     Patient questions:  Do you have a fever, pain , or abdominal swelling? No. Pain Score  0 *  Have you tolerated food without any problems? Yes.    Have you been able to return to your normal activities? Yes.    Do you have any questions about your discharge instructions: Diet   No. Medications  No. Follow up visit  No.  Do you have questions or concerns about your Care? No.  Actions: * If pain score is 4 or above: No action needed, pain <4.

## 2022-06-18 ENCOUNTER — Other Ambulatory Visit (HOSPITAL_COMMUNITY): Payer: Self-pay

## 2022-06-21 ENCOUNTER — Encounter: Payer: 59 | Admitting: Internal Medicine

## 2022-06-22 ENCOUNTER — Ambulatory Visit (INDEPENDENT_AMBULATORY_CARE_PROVIDER_SITE_OTHER): Payer: 59 | Admitting: Family Medicine

## 2022-06-22 ENCOUNTER — Encounter: Payer: Self-pay | Admitting: Family Medicine

## 2022-06-22 ENCOUNTER — Other Ambulatory Visit (HOSPITAL_COMMUNITY): Payer: Self-pay

## 2022-06-22 ENCOUNTER — Other Ambulatory Visit: Payer: Self-pay

## 2022-06-22 VITALS — BP 161/90 | HR 63 | Ht 65.0 in | Wt 180.4 lb

## 2022-06-22 DIAGNOSIS — I10 Essential (primary) hypertension: Secondary | ICD-10-CM

## 2022-06-22 DIAGNOSIS — L309 Dermatitis, unspecified: Secondary | ICD-10-CM

## 2022-06-22 DIAGNOSIS — E669 Obesity, unspecified: Secondary | ICD-10-CM

## 2022-06-22 DIAGNOSIS — R400 Somnolence: Secondary | ICD-10-CM | POA: Insufficient documentation

## 2022-06-22 DIAGNOSIS — E1169 Type 2 diabetes mellitus with other specified complication: Secondary | ICD-10-CM | POA: Diagnosis not present

## 2022-06-22 DIAGNOSIS — Z6830 Body mass index (BMI) 30.0-30.9, adult: Secondary | ICD-10-CM | POA: Diagnosis not present

## 2022-06-22 LAB — POCT GLYCOSYLATED HEMOGLOBIN (HGB A1C): HbA1c, POC (controlled diabetic range): 6 % (ref 0.0–7.0)

## 2022-06-22 MED ORDER — OMRON 3 SERIES BP MONITOR DEVI
0 refills | Status: DC
  Filled 2022-06-22: qty 1, 30d supply, fill #0

## 2022-06-22 MED ORDER — LOSARTAN POTASSIUM 25 MG PO TABS
25.0000 mg | ORAL_TABLET | Freq: Every day | ORAL | 1 refills | Status: DC
  Filled 2022-06-22: qty 90, 90d supply, fill #0
  Filled 2022-09-11: qty 90, 90d supply, fill #1

## 2022-06-22 MED ORDER — SEMAGLUTIDE-WEIGHT MANAGEMENT 0.5 MG/0.5ML ~~LOC~~ SOAJ
0.5000 mg | SUBCUTANEOUS | 0 refills | Status: DC
  Filled 2022-06-22: qty 2, 28d supply, fill #0

## 2022-06-22 MED ORDER — WEGOVY 0.5 MG/0.5ML ~~LOC~~ SOAJ
0.5000 mg | SUBCUTANEOUS | 0 refills | Status: DC
  Filled 2022-06-22: qty 2, 28d supply, fill #0

## 2022-06-22 NOTE — Assessment & Plan Note (Signed)
Uncontrolled BP likely due to medication non-adherence. She has not requested a refill of her Propranolol since four months ago. Losartan was d/ced to get her on Propranolol for Migraine headaches. However, she has been doing well with her Migraine off Propranolol. Given her DM, we discussed getting her back on Losartan. Losartan 25 mg escribed. F/U in 4 weeks for reassessment.

## 2022-06-22 NOTE — Assessment & Plan Note (Signed)
On Wegovy. Monitor closely.

## 2022-06-22 NOTE — Progress Notes (Addendum)
    SUBJECTIVE:   CHIEF COMPLAINT / HPI:   DM2/Obesity: On Wegovy 0.25 mg weekly. Here for f/u.  HTN: Takes propranolol inconsistently.   Skin rash: C/O itchy rash on her back x 5 months. Was initially on her left upper back and now on her right upper back. Betamethasone helped some.  Drowsiness: C/O excessive daytime sleepiness at the end of the visit. She stays tired and sleepy all day.    PERTINENT  PMH / PSH: PMHx reviewed  OBJECTIVE:   BP (!) 161/90 Comment: right arm  Pulse 63   Ht 5\' 5"  (1.651 m)   Wt 180 lb 6.4 oz (81.8 kg)   LMP  (LMP Unknown)   SpO2 100%   BMI 30.02 kg/m   Physical Exam Vitals and nursing note reviewed.  Cardiovascular:     Rate and Rhythm: Normal rate and regular rhythm.     Heart sounds: Normal heart sounds. No murmur heard. Pulmonary:     Effort: Pulmonary effort is normal. No respiratory distress.     Breath sounds: Normal breath sounds. No wheezing.  Skin:    Comments: Round hyperpigmented patch on her right upper back.      ASSESSMENT/PLAN:   DM2 (diabetes mellitus, type 2) (HCC) Her A1C increased from 5.8 to 6 on Wegovy 0.25 mg. Wegovy increased to 0.5 mg weekly. Monitor for now.  Obese On Wegovy. Monitor closely.   Essential hypertension Uncontrolled BP likely due to medication non-adherence. She has not requested a refill of her Propranolol since four months ago. Losartan was d/ced to get her on Propranolol for Migraine headaches. However, she has been doing well with her Migraine off Propranolol. Given her DM, we discussed getting her back on Losartan. Losartan 25 mg escribed. F/U in 4 weeks for reassessment.   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 Schedule appointment for skin biopsy. She will resume Betamethasone following skin biopsy. Antihistamine as needed for itching. She agreed with the plan.   Drowsiness May be related to her chronic fatigue vs sleep apnea. R/O endocrine disorder vs anemia. TSH  and CBC recommended today. She prefers to return for labs. We will readdress at her next visit.     Janit Pagan, MD [facility]

## 2022-06-22 NOTE — Assessment & Plan Note (Signed)
May be related to her chronic fatigue vs sleep apnea. R/O endocrine disorder vs anemia. TSH and CBC recommended today. She prefers to return for labs. We will readdress at her next visit.

## 2022-06-22 NOTE — Assessment & Plan Note (Signed)
Her A1C increased from 5.8 to 6 on Wegovy 0.25 mg. Wegovy increased to 0.5 mg weekly. Monitor for now.

## 2022-06-22 NOTE — Assessment & Plan Note (Addendum)
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 Schedule appointment for skin biopsy. She will resume Betamethasone following skin biopsy. Antihistamine as needed for itching. She agreed with the plan.

## 2022-06-23 ENCOUNTER — Emergency Department (HOSPITAL_COMMUNITY): Payer: 59

## 2022-06-23 ENCOUNTER — Other Ambulatory Visit: Payer: Self-pay

## 2022-06-23 ENCOUNTER — Encounter: Payer: Self-pay | Admitting: Internal Medicine

## 2022-06-23 ENCOUNTER — Emergency Department (HOSPITAL_COMMUNITY)
Admission: EM | Admit: 2022-06-23 | Discharge: 2022-06-23 | Disposition: A | Discharge status: Home / Self Care | Payer: 59 | Attending: Emergency Medicine | Admitting: Emergency Medicine

## 2022-06-23 DIAGNOSIS — R531 Weakness: Secondary | ICD-10-CM | POA: Diagnosis not present

## 2022-06-23 DIAGNOSIS — I10 Essential (primary) hypertension: Secondary | ICD-10-CM | POA: Diagnosis not present

## 2022-06-23 DIAGNOSIS — E119 Type 2 diabetes mellitus without complications: Secondary | ICD-10-CM | POA: Diagnosis not present

## 2022-06-23 DIAGNOSIS — R519 Headache, unspecified: Secondary | ICD-10-CM | POA: Diagnosis not present

## 2022-06-23 DIAGNOSIS — Z79899 Other long term (current) drug therapy: Secondary | ICD-10-CM | POA: Diagnosis not present

## 2022-06-23 DIAGNOSIS — Z794 Long term (current) use of insulin: Secondary | ICD-10-CM | POA: Insufficient documentation

## 2022-06-23 LAB — CBC WITH DIFFERENTIAL/PLATELET
Abs Immature Granulocytes: 0.02 10*3/uL (ref 0.00–0.07)
Basophils Absolute: 0 10*3/uL (ref 0.0–0.1)
Basophils Relative: 1 %
Eosinophils Absolute: 0.2 10*3/uL (ref 0.0–0.5)
Eosinophils Relative: 3 %
HCT: 42.8 % (ref 36.0–46.0)
Hemoglobin: 13.7 g/dL (ref 12.0–15.0)
Immature Granulocytes: 0 %
Lymphocytes Relative: 49 %
Lymphs Abs: 3.5 10*3/uL (ref 0.7–4.0)
MCH: 28.9 pg (ref 26.0–34.0)
MCHC: 32 g/dL (ref 30.0–36.0)
MCV: 90.3 fL (ref 80.0–100.0)
Monocytes Absolute: 0.5 10*3/uL (ref 0.1–1.0)
Monocytes Relative: 7 %
Neutro Abs: 2.8 10*3/uL (ref 1.7–7.7)
Neutrophils Relative %: 40 %
Platelets: 253 10*3/uL (ref 150–400)
RBC: 4.74 MIL/uL (ref 3.87–5.11)
RDW: 13.5 % (ref 11.5–15.5)
WBC: 7 10*3/uL (ref 4.0–10.5)
nRBC: 0 % (ref 0.0–0.2)

## 2022-06-23 LAB — BASIC METABOLIC PANEL
Anion gap: 8 (ref 5–15)
BUN: 8 mg/dL (ref 6–20)
CO2: 27 mmol/L (ref 22–32)
Calcium: 9 mg/dL (ref 8.9–10.3)
Chloride: 103 mmol/L (ref 98–111)
Creatinine, Ser: 0.97 mg/dL (ref 0.44–1.00)
GFR, Estimated: >60 mL/min (ref >60)
Glucose, Bld: 86 mg/dL (ref 70–99)
Potassium: 3.5 mmol/L (ref 3.5–5.1)
Sodium: 138 mmol/L (ref 135–145)

## 2022-06-23 LAB — TROPONIN I (HIGH SENSITIVITY)
Troponin I (High Sensitivity): 5 ng/L (ref <18)
Troponin I (High Sensitivity): 4 ng/L (ref <18)

## 2022-06-23 MED ORDER — LOSARTAN POTASSIUM 25 MG PO TABS
25.0000 mg | ORAL_TABLET | Freq: Once | ORAL | Status: AC
  Administered 2022-06-23: 25 mg via ORAL
  Filled 2022-06-23: qty 1

## 2022-06-23 MED ORDER — KETOROLAC TROMETHAMINE 15 MG/ML IJ SOLN
15.0000 mg | Freq: Once | INTRAMUSCULAR | Status: AC
  Administered 2022-06-23: 15 mg via INTRAVENOUS
  Filled 2022-06-23: qty 1

## 2022-06-23 NOTE — ED Provider Notes (Signed)
[place] [facility]   CSN: [id number] Arrival date & time: 06/23/22  2014     History  Chief Complaint  Patient presents with   Hypertension    Sharon Buttsamela Sanders [name] is a 62 y.o. female.   Hypertension     Patient with medical history of hypertension, hyperlipidemia, migraines, diabetes, GERD presents to the emergency department due to headache, nausea and hypertension.  Patient is been off her hypertensive medication for some time, states that 2 weeks ago she started having nausea but was not associated with vomiting.  She was having headaches which are different than her typical headaches, they wax and wane throughout the day, there is no sudden thunderclap onset.  Denies any lateralized weakness or numbness, no specific chest pain though she does feel somewhat lightheaded.  To my primary yesterday, started on losartan which she is taking in the evenings.  Is not taken her nightly dose today yet.  Came to the ED because she was still feeling bad and blood pressure was elevated.  Home Medications Prior to Admission medications   Medication Sig Start Date End Date Taking? Authorizing Provider  betamethasone dipropionate 0.05 % cream Apply topically 2 (two) times daily. Patient not taking: Reported on 06/22/2022 04/10/22   Doreene ElandEniola, Kehinde T, MD  blood glucose meter kit and supplies Dispense based on patient and insurance preference. Use once a day as directed. (FOR ICD-10 E10.9, E11.9). 07/29/19   Carlus PavlovGherghe, Cristina, MD  Blood Pressure Monitoring (OMRON 3 SERIES BP MONITOR) DEVI USE AS DIRECTED 06/22/22     diclofenac (VOLTAREN) 75 MG EC tablet Take 1 tablet (75 mg total) by mouth 2 (two) times daily as needed. Patient not taking: Reported on 06/22/2022 01/19/22   Vonna DraftsMahmood, Atif, MD  Estradiol (VAGIFEM) 10 MCG TABS vaginal tablet Place 1 tablet (10 mcg total) vaginally 2 (two) times a week. Patient not taking: Reported on 06/22/2022 02/08/22    Patton SallesAmundson C Silva, Brook E, MD  estradiol (VIVELLE-DOT) 0.05 MG/24HR patch Place 1 patch (0.05 mg total) onto the skin 2 (two) times a week. 02/08/22   Patton SallesAmundson C Silva, Brook E, MD  ezetimibe (ZETIA) 10 MG tablet Take 1 tablet (10 mg total) by mouth daily. 04/11/22   Doreene ElandEniola, Kehinde T, MD  fluticasone (FLONASE) 50 MCG/ACT nasal spray Place 1-2 sprays into both nostrils daily. Patient not taking: Reported on 06/22/2022 05/29/22   Doreene ElandEniola, Kehinde T, MD  levocetirizine (XYZAL) 5 MG tablet Take 1 tablet (5 mg total) by mouth every evening. 05/08/22   Doreene ElandEniola, Kehinde T, MD  losartan (COZAAR) 25 MG tablet Take 1 tablet (25 mg total) by mouth at bedtime. 06/22/22   Doreene ElandEniola, Kehinde T, MD  lubiprostone (AMITIZA) 8 MCG capsule Take 1 capsule (8 mcg total) by mouth 2 (two) times daily with a meal. 06/13/22   Iva BoopGessner, Carl E, MD  montelukast (SINGULAIR) 10 MG tablet Take 1 tablet (10 mg total) by mouth at bedtime. 01/19/22   Vonna DraftsMahmood, Atif, MD  pantoprazole (PROTONIX) 40 MG tablet Take 1 tablet (40 mg total) by mouth daily before breakfast. 06/13/22   Iva BoopGessner, Carl E, MD  rosuvastatin (CRESTOR) 40 MG tablet Take 1 tablet (40 mg total) by mouth at bedtime. 04/11/22   Doreene ElandEniola, Kehinde T, MD  Semaglutide-Weight Management 0.5 MG/0.5ML SOAJ Inject 0.5 mg into the skin once a week. 06/22/22   Doreene ElandEniola, Kehinde T, MD      Allergies    Sulfa antibiotics    Review  of Systems   Review of Systems  Physical Exam Updated Vital Signs BP 139/84   Pulse 77   Temp 98.1 F (36.7 C) (Oral)   Resp 20   Ht 5\' 5"  (1.651 m)   Wt 81.6 kg   LMP  (LMP Unknown)   SpO2 100%   BMI 29.95 kg/m  Physical Exam Vitals and nursing note reviewed. Exam conducted with a chaperone present.  Constitutional:      Appearance: Normal appearance.  HENT:     Head: Normocephalic and atraumatic.  Eyes:     General: No scleral icterus.       Right eye: No discharge.        Left eye: No discharge.     Extraocular Movements: Extraocular movements  intact.     Pupils: Pupils are equal, round, and reactive to light.  Cardiovascular:     Rate and Rhythm: Normal rate and regular rhythm.     Pulses: Normal pulses.     Heart sounds: Normal heart sounds.     No friction rub. No gallop.     Comments: Upper and lower extremity pulses are symmetric bilaterally Pulmonary:     Effort: Pulmonary effort is normal. No respiratory distress.     Breath sounds: Normal breath sounds.  Abdominal:     General: Abdomen is flat. Bowel sounds are normal. There is no distension.     Palpations: Abdomen is soft.     Tenderness: There is no abdominal tenderness.  Skin:    General: Skin is warm and dry.     Coloration: Skin is not jaundiced.  Neurological:     Mental Status: She is alert and oriented to person, place, and time. Mental status is at baseline.     Cranial Nerves: No cranial nerve deficit.     Motor: No weakness.     Coordination: Coordination normal.     Gait: Gait normal.     ED Results / Procedures / Treatments   Labs (all labs ordered are listed, but only abnormal results are displayed) Labs Reviewed  BASIC METABOLIC PANEL  CBC WITH DIFFERENTIAL/PLATELET  CBC WITH DIFFERENTIAL/PLATELET  TROPONIN I (HIGH SENSITIVITY)  TROPONIN I (HIGH SENSITIVITY)    EKG None  Radiology CT Head Wo Contrast  Result Date: 06/23/2022 CLINICAL DATA:  Severe headache EXAM: CT HEAD WITHOUT CONTRAST TECHNIQUE: Contiguous axial images were obtained from the base of the skull through the vertex without intravenous contrast. RADIATION DOSE REDUCTION: This exam was performed according to the departmental dose-optimization program which includes automated exposure control, adjustment of the mA and/or kV according to patient size and/or use of iterative reconstruction technique. COMPARISON:  CT head 03/31/2021 FINDINGS: Brain: No intracranial hemorrhage, mass effect, or evidence of acute infarct. No hydrocephalus. No extra-axial fluid collection. Vascular:  No hyperdense vessel or unexpected calcification. Skull: No fracture or focal lesion. Sinuses/Orbits: No acute finding. Paranasal sinuses and mastoid air cells are well aerated. Other: None. IMPRESSION: No acute intracranial process. Electronically Signed   By: Minerva Fester M.D.   On: 06/23/2022 21:49   DG Chest Portable 1 View  Result Date: 06/23/2022 CLINICAL DATA:  Elevated blood pressure and weakness. EXAM: PORTABLE CHEST 1 VIEW COMPARISON:  March 28, 2022 FINDINGS: The heart size and mediastinal contours are within normal limits. Both lungs are clear. A radiopaque fusion plate and screws are seen overlying the lower cervical spine. The visualized skeletal structures are unremarkable. IMPRESSION: No active disease. Electronically Signed   By: Waylan Rocher  [place] M.D.   On: 06/23/2022 21:36    Procedures Procedures    Medications Ordered in ED Medications  losartan (COZAAR) tablet 25 mg (25 mg Oral Given 06/23/22 2125)  ketorolac (TORADOL) 15 MG/ML injection 15 mg (15 mg Intravenous Given 06/23/22 2235)    ED Course/ Medical Decision Making/ A&P                             Medical Decision Making Amount and/or Complexity of Data Reviewed Labs: ordered. Radiology: ordered.  Risk Prescription drug management.   This is a 62 year old female presenting to the emergency department due to hypertension, headache and nausea.  Differential is broad and includes hypertensive emergency, SAH, renal impairment, ACS, arrhythmia, dehydration, medication noncompliance.  Ordered, viewed and interpreted laboratory workup.  CBC without leukocytosis or anemia.  BMP without gross electrolyte derangement or renal impairment or AKI.  Troponin is low, also not consistent with ACS given no real chest pain or shortness of breath.  EKG shows sinus rhythm, no ischemic changes.  CT head [facility] or any acute process intracranial mass.  Chest x-ray is negative for cardiomegaly, no widened mediastinum or  indication of dissection additionally story is not consistent with that.  I reeval the patient, headache improved after the Toradol.  I ordered losartan for the patient's blood pressure as her home medicine.  No evidence endorgan damage for acute emergent process.  Patient is stable for close outpatient follow-up with her PCP.         Final Clinical Impression(s) / ED Diagnoses Final diagnoses:  Hypertension, unspecified type  Bad headache    Rx / DC Orders ED Discharge Orders     None         Theron AristaSage, Deloyd Handy, Cordelia Poche-C 06/23/22 2242    Rondel BatonPaterson, Robert C, MD 06/26/22 [phone]

## 2022-06-23 NOTE — Discharge Instructions (Signed)
Your workup today was very reassuring.  Your CT imaging were negative for any acute process, your blood work was also reassuring without any signs of endorgan damage.  Continue taking your home medications, call your primary care doctor Monday let them know you are seen in the ED and schedule follow-up.  Return to the ED if you have any new or concerning or worsening symptoms.

## 2022-06-23 NOTE — ED Triage Notes (Signed)
Patient reports high BP at home, nausea, headache, and lightheadedness. States she takes losartan and restarted this yesterday. Last BP at home 168/102.

## 2022-06-25 ENCOUNTER — Encounter: Payer: Self-pay | Admitting: Family Medicine

## 2022-06-26 ENCOUNTER — Ambulatory Visit (INDEPENDENT_AMBULATORY_CARE_PROVIDER_SITE_OTHER): Payer: 59 | Admitting: Family Medicine

## 2022-06-26 ENCOUNTER — Encounter: Payer: Self-pay | Admitting: Family Medicine

## 2022-06-26 VITALS — BP 132/90 | HR 70 | Ht 65.0 in | Wt 180.2 lb

## 2022-06-26 DIAGNOSIS — E559 Vitamin D deficiency, unspecified: Secondary | ICD-10-CM | POA: Diagnosis not present

## 2022-06-26 DIAGNOSIS — R5383 Other fatigue: Secondary | ICD-10-CM

## 2022-06-26 DIAGNOSIS — G4719 Other hypersomnia: Secondary | ICD-10-CM | POA: Diagnosis not present

## 2022-06-26 DIAGNOSIS — I10 Essential (primary) hypertension: Secondary | ICD-10-CM

## 2022-06-26 DIAGNOSIS — G4730 Sleep apnea, unspecified: Secondary | ICD-10-CM | POA: Diagnosis not present

## 2022-06-26 NOTE — Assessment & Plan Note (Signed)
Her home BP report reviewed. BP looks good today. Continue current regimen with close home BP monitoring. Goal as discussed is <140/90.

## 2022-06-26 NOTE — Assessment & Plan Note (Signed)
Referred to sleep clinic for sleep study.

## 2022-06-26 NOTE — Patient Instructions (Signed)
Fatigue If you have fatigue, you feel tired all the time and have a lack of energy or a lack of motivation. Fatigue may make it difficult to start or complete tasks because of exhaustion. Occasional or mild fatigue is often a normal response to activity or life. However, long-term (chronic) or extreme fatigue may be a symptom of a medical condition such as: Depression. Not having enough red blood cells or hemoglobin in the blood (anemia). A problem with a small gland located in the lower front part of the neck (thyroid disorder). Rheumatologic conditions. These are problems related to the body's defense system (immune system). Infections, especially certain viral infections. Fatigue can also lead to negative health outcomes over time. Follow these instructions at home: Medicines Take over-the-counter and prescription medicines only as told by your health care provider. Take a multivitamin if told by your health care provider. Do not use herbal or dietary supplements unless they are approved by your health care provider. Eating and drinking  Avoid heavy meals in the evening. Eat a well-balanced diet, which includes lean proteins, whole grains, plenty of fruits and vegetables, and low-fat dairy products. Avoid eating or drinking too many products with caffeine in them. Avoid alcohol. Drink enough fluid to keep your urine pale yellow. Activity  Exercise regularly, as told by your health care provider. Use or practice techniques to help you relax, such as yoga, tai chi, meditation, or massage therapy. Lifestyle Change situations that cause you stress. Try to keep your work and personal schedules in balance. Do not use recreational or illegal drugs. General instructions Monitor your fatigue for any changes. Go to bed and get up at the same time every day. Avoid fatigue by pacing yourself during the day and getting enough sleep at night. Maintain a healthy weight. Contact a health care  provider if: Your fatigue does not get better. You have a fever. You suddenly lose or gain weight. You have headaches. You have trouble falling asleep or sleeping through the night. You feel angry, guilty, anxious, or sad. You have swelling in your legs or another part of your body. Get help right away if: You feel confused, feel like you might faint, or faint. Your vision is blurry or you have a severe headache. You have severe pain in your abdomen, your back, or the area between your waist and hips (pelvis). You have chest pain, shortness of breath, or an irregular or fast heartbeat. You are unable to urinate, or you urinate less than normal. You have abnormal bleeding from the rectum, nose, lungs, nipples, or, if you are female, the vagina. You vomit blood. You have thoughts about hurting yourself or others. These symptoms may be an emergency. Get help right away. Call 911. Do not wait to see if the symptoms will go away. Do not drive yourself to the hospital. Get help right away if you feel like you may hurt yourself or others, or have thoughts about taking your own life. Go to your nearest emergency room or: Call 911. Call the National Suicide Prevention Lifeline at 1-800-273-8255 or 988. This is open 24 hours a day. Text the Crisis Text Line at 741741. Summary If you have fatigue, you feel tired all the time and have a lack of energy or a lack of motivation. Fatigue may make it difficult to start or complete tasks because of exhaustion. Long-term (chronic) or extreme fatigue may be a symptom of a medical condition. Exercise regularly, as told by your health care provider.   Change situations that cause you stress. Try to keep your work and personal schedules in balance. This information is not intended to replace advice given to you by your health care provider. Make sure you discuss any questions you have with your health care provider. Document Revised: 12/26/2020 Document  Reviewed: 12/26/2020 Elsevier Patient Education  2023 Elsevier Inc.  

## 2022-06-26 NOTE — Progress Notes (Signed)
    SUBJECTIVE:   CHIEF COMPLAINT / HPI:   HTN:  C/O elevated BP, which prompted an ED visit. She denies headaches but does have mild sinus pressure. No upper respiratory symptoms or fever, no vision change or blurriness. She is now on her Losartan 25 mg QD with some improvement in her BP.   Fatigue:  C/O fatigue has been ongoing for months and is now worsening. Sometimes, she feels so sleepy during the day and falls asleep while sitting during the daytime. She has hx of sleep apnea and is not on CPAP.  Throat symptoms: C/O intermittent snapping in her throat/neck with yawning since her endoscopy two weeks ago. No pain or swelling. No dysphagia.   PERTINENT  PMH / PSH: PMHx reviewed  OBJECTIVE:   BP (!) 132/90   Pulse 70   Ht 5\' 5"  (1.651 m)   Wt 180 lb 3.2 oz (81.7 kg)   LMP  (LMP Unknown)   SpO2 100%   BMI 29.99 kg/m   Physical Exam Vitals and nursing note reviewed.  Neck:     Comments: No thyromegaly Cardiovascular:     Rate and Rhythm: Normal rate and regular rhythm.     Heart sounds: Normal heart sounds. No murmur heard. Pulmonary:     Effort: Pulmonary effort is normal. No respiratory distress.     Breath sounds: Normal breath sounds. No wheezing.  Abdominal:     General: Abdomen is flat. There is no distension.     Palpations: Abdomen is soft.     Tenderness: There is no abdominal tenderness.  Musculoskeletal:     Cervical back: Neck supple. No tenderness.  Lymphadenopathy:     Cervical: No cervical adenopathy.  Neurological:     General: No focal deficit present.      ASSESSMENT/PLAN:   Essential hypertension Her home BP report reviewed. BP looks good today. Continue current regimen with close home BP monitoring. Goal as discussed is <140/90.  Fatigue Differentials include anemia, electrolyte imbalance, chronic fatigue syndrome, OSA I discussed referral to sleep study and she agreed with this. Lab check: Cmet, anemia profile, Vit d, TSHm  Magnesium and phosphorus I will contact her soon with her test results.   Sleep apnea Referred to sleep clinic for sleep study.     Janit Pagan, MD [facility]

## 2022-06-26 NOTE — Assessment & Plan Note (Signed)
Differentials include anemia, electrolyte imbalance, chronic fatigue syndrome, OSA I discussed referral to sleep study and she agreed with this. Lab check: Cmet, anemia profile, Vit d, TSHm Magnesium and phosphorus I will contact her soon with her test results.

## 2022-06-27 ENCOUNTER — Telehealth: Payer: Self-pay | Admitting: Family Medicine

## 2022-06-27 LAB — ANEMIA PROFILE B
Basophils Absolute: 0 10*3/uL (ref 0.0–0.2)
Basos: 0 %
EOS (ABSOLUTE): 0.1 10*3/uL (ref 0.0–0.4)
Eos: 3 %
Ferritin: 93 ng/mL (ref 15–150)
Folate: 3.1 ng/mL (ref >3.0)
Hematocrit: 39.4 % (ref 34.0–46.6)
Hemoglobin: 12.8 g/dL (ref 11.1–15.9)
Immature Grans (Abs): 0 10*3/uL (ref 0.0–0.1)
Immature Granulocytes: 0 %
Iron Saturation: 24 % (ref 15–55)
Iron: 73 ug/dL (ref 27–159)
Lymphocytes Absolute: 2.4 10*3/uL (ref 0.7–3.1)
Lymphs: 45 %
MCH: 28.9 pg (ref 26.6–33.0)
MCHC: 32.5 g/dL (ref 31.5–35.7)
MCV: 89 fL (ref 79–97)
Monocytes Absolute: 0.4 10*3/uL (ref 0.1–0.9)
Monocytes: 7 %
Neutrophils Absolute: 2.4 10*3/uL (ref 1.4–7.0)
Neutrophils: 45 %
Platelets: 269 10*3/uL (ref 150–450)
RBC: 4.43 x10E6/uL (ref 3.77–5.28)
RDW: 13.4 % (ref 11.7–15.4)
Retic Ct Pct: 1 % (ref 0.6–2.6)
Total Iron Binding Capacity: 304 ug/dL (ref 250–450)
UIBC: 231 ug/dL (ref 131–425)
Vitamin B-12: 378 pg/mL (ref 232–1245)
WBC: 5.4 10*3/uL (ref 3.4–10.8)

## 2022-06-27 LAB — CMP14+EGFR
ALT: 42 IU/L — ABNORMAL HIGH (ref 0–32)
AST: 33 IU/L (ref 0–40)
Albumin/Globulin Ratio: 1.7 (ref 1.2–2.2)
Albumin: 4.1 g/dL (ref 3.8–4.9)
Alkaline Phosphatase: 99 IU/L (ref 44–121)
BUN/Creatinine Ratio: 9 — ABNORMAL LOW (ref 12–28)
BUN: 8 mg/dL (ref 8–27)
Bilirubin Total: 0.3 mg/dL (ref 0.0–1.2)
CO2: 25 mmol/L (ref 20–29)
Calcium: 9.3 mg/dL (ref 8.7–10.3)
Chloride: 105 mmol/L (ref 96–106)
Creatinine, Ser: 0.94 mg/dL (ref 0.57–1.00)
Globulin, Total: 2.4 g/dL (ref 1.5–4.5)
Glucose: 72 mg/dL (ref 70–99)
Potassium: 3.8 mmol/L (ref 3.5–5.2)
Sodium: 142 mmol/L (ref 134–144)
Total Protein: 6.5 g/dL (ref 6.0–8.5)
eGFR: 69 mL/min/{1.73_m2} (ref >59)

## 2022-06-27 LAB — LIPID PANEL
Chol/HDL Ratio: 2.8 ratio (ref 0.0–4.4)
Cholesterol, Total: 123 mg/dL (ref 100–199)
HDL: 44 mg/dL (ref >39)
LDL Chol Calc (NIH): 65 mg/dL (ref 0–99)
Triglycerides: 66 mg/dL (ref 0–149)
VLDL Cholesterol Cal: 14 mg/dL (ref 5–40)

## 2022-06-27 LAB — TSH RFX ON ABNORMAL TO FREE T4: TSH: 0.803 u[IU]/mL (ref 0.450–4.500)

## 2022-06-27 LAB — PHOSPHORUS: Phosphorus: 3.4 mg/dL (ref 3.0–4.3)

## 2022-06-27 LAB — MAGNESIUM: Magnesium: 2 mg/dL (ref 1.6–2.3)

## 2022-06-27 LAB — VITAMIN D 25 HYDROXY (VIT D DEFICIENCY, FRACTURES): Vit D, 25-Hydroxy: 21.6 ng/mL — ABNORMAL LOW (ref 30.0–100.0)

## 2022-06-27 NOTE — Telephone Encounter (Signed)
Result discussed.  Vitamin low - she will start OTC vitamin QD.  ALT mildly elevated - avoid nephrotoxic agents and repeat in about 6 months.  She verbalized understanding.

## 2022-06-29 ENCOUNTER — Encounter: Payer: Self-pay | Admitting: Family Medicine

## 2022-07-04 LAB — HM DIABETES EYE EXAM

## 2022-07-13 ENCOUNTER — Encounter: Payer: Self-pay | Admitting: Family Medicine

## 2022-07-13 ENCOUNTER — Other Ambulatory Visit (HOSPITAL_COMMUNITY): Payer: Self-pay

## 2022-07-13 ENCOUNTER — Ambulatory Visit (INDEPENDENT_AMBULATORY_CARE_PROVIDER_SITE_OTHER): Payer: 59 | Admitting: Family Medicine

## 2022-07-13 VITALS — BP 137/89 | HR 74 | Ht 65.0 in | Wt 177.2 lb

## 2022-07-13 DIAGNOSIS — E1169 Type 2 diabetes mellitus with other specified complication: Secondary | ICD-10-CM | POA: Diagnosis not present

## 2022-07-13 DIAGNOSIS — L299 Pruritus, unspecified: Secondary | ICD-10-CM | POA: Diagnosis not present

## 2022-07-13 DIAGNOSIS — E785 Hyperlipidemia, unspecified: Secondary | ICD-10-CM | POA: Diagnosis not present

## 2022-07-13 DIAGNOSIS — E663 Overweight: Secondary | ICD-10-CM | POA: Diagnosis not present

## 2022-07-13 DIAGNOSIS — L309 Dermatitis, unspecified: Secondary | ICD-10-CM | POA: Diagnosis not present

## 2022-07-13 DIAGNOSIS — I10 Essential (primary) hypertension: Secondary | ICD-10-CM

## 2022-07-13 MED ORDER — SEMAGLUTIDE-WEIGHT MANAGEMENT 1 MG/0.5ML ~~LOC~~ SOAJ
1.0000 mg | SUBCUTANEOUS | 0 refills | Status: DC
  Filled 2022-07-13 – 2022-07-27 (×2): qty 2, 28d supply, fill #0

## 2022-07-13 NOTE — Assessment & Plan Note (Signed)
Etiology unclear. Rash on her back responded well to betamethasone cream. Skin biopsy canceled. Itchy abdominal skin without a rash may be due to allergy. I discussed referral to an allergist for skin patch test. She agreed with the plan.

## 2022-07-13 NOTE — Assessment & Plan Note (Signed)
Weight trending down with lifestyle modification. Also on [facility]

## 2022-07-13 NOTE — Assessment & Plan Note (Signed)
BP looks good on multiple checks despite being elevated at home. I discussed referral to pharmacy for ambulatory BP monitoring. However, she declined this. No medication dose adjustment for now.

## 2022-07-13 NOTE — Patient Instructions (Signed)
Allergy Skin Testing Why am I having this test?     Allergy skin testing is done to check whether you have an allergy to something. An allergy occurs when your body's defense system (immune system) is more sensitive to certain substances. The immune system overreacts to the substance, causing allergy symptoms. Allergy skin testing can be used to help find out which substance may be causing your symptoms. Skin testing may be done in one of three ways: Injecting a small amount of the substance that you may be allergic to (allergen) under your skin (intradermal test). Placing a drop of a solution that contains the allergen onto your forearm or back, then puncturing the skin where the droplet is and allowing the fluid to seep in (prick-puncture test or scratch test). Applying the allergen to patches that are placed on your skin (patch test). What is being tested? This test checks to see if an allergic reaction develops on the skin where the allergen was injected or where the patches were applied. Common allergens tested include: Pollens. Dust. Food. Latex. Certain medicines. How do I prepare for this test? If you are having the intradermal or prick-puncture test, no preparation is needed. If you are having a patch test: Do not apply ointments, creams, or lotion to the skin where the patch will be placed. Usually, the patches are placed on your forearm or on your back. Bring any items that you think you are allergic to, such as cosmetics, soaps, and perfume. Some medicines can affect test results. Your health care provider will let you know when to stop taking those medicines and when you can begin taking them again. Tell a health care provider about: Any allergies you have, especially if you have ever had a severe allergic reaction. All medicines you are taking, including vitamins, herbs, eye drops, creams, and over-the-counter medicines. Any medical conditions you have. What happens during  the test? If you will receive an injection or prick-puncture, it will be done in your health care provider's office, and you will likely get your results before leaving. If patches will be applied to your skin, you will need to: Wear them for 48 hours. Return to your health care provider's office to have them removed. Do not remove them yourself. Avoid bathing and activities that cause heavy sweating until after the patches are removed. How are the results reported? Your test results will be reported as measurements of any areas of skin affected. Your health care provider will compare your results to normal ranges that were established after testing a large group of people (reference ranges). Reference ranges may vary among labs and hospitals. For this test, a common normal reference range is: A swollen area of skin (wheal) less than 3 mm in diameter. Surrounding redness and swelling (flare-up) less than 10 mm in diameter. What do the results mean? A result in which the wheal is 3 mm or more and the flare-up is 10 mm or more means that you are likely allergic to the allergen. Talk with your health care provider about what your results mean. Your health care provider will consider the results of your test in addition to your symptoms before diagnosing you with an allergy. Questions to ask your health care provider Ask your health care provider, or the department that is doing the test: When will my results be ready? How will I get my results? What are my treatment options? What other tests do I need? What are my next steps? Summary  Allergy skin testing is done to check whether you have an allergy to something. This test checks to see if an allergic reaction develops on the skin where the allergen was injected or where the patches were applied. Skin testing may be done in one of three ways: an intradermal test, a prick-puncture or scratch test, or a patch test. A wheal or flare-up on the skin  that is larger than a certain size means that you are likely allergic to the allergen. Talk with your health care provider about what your results mean. This information is not intended to replace advice given to you by your health care provider. Make sure you discuss any questions you have with your health care provider. Document Revised: 04/20/2020 Document Reviewed: 04/20/2020 Elsevier Patient Education  2023 [employer].

## 2022-07-13 NOTE — Assessment & Plan Note (Addendum)
Doing well on Wegovy 0.5 mg. Dose increased to 1 mg. Med Refilled. Repeat A1C in August/2024

## 2022-07-13 NOTE — Progress Notes (Signed)
    SUBJECTIVE:   CHIEF COMPLAINT / HPI:   DM/Weight: She is compliant with Monjaro 0.5 mg QD. Here for f/u. No new concerns.   HTN: C/O high BP to the 170/80s at home. She is compliant with her Losartan 25 mg QHS. Sometimes, her high BP occurs with a headache. No other neurologic symptoms.  Skin rash: Here for skin biopsy of her right upper back kin lesion. She used topical betamethasone for the past few weeks with the improvement of the rash and the itching. She c/o itchy abdomen which started about 1 week ago with no rash. She denies change in her diet, medication or body lotion or soap. No known trigger.   PERTINENT  PMH / PSH: PMHx reviewed  OBJECTIVE:   BP 137/89   Pulse 74   Ht 5\' 5"  (1.651 m)   Wt 177 lb 4 oz (80.4 kg)   LMP  (LMP Unknown)   SpO2 100%   BMI 29.50 kg/m   Physical Exam Vitals and nursing note reviewed.  Cardiovascular:     Rate and Rhythm: Normal rate and regular rhythm.     Heart sounds: Normal heart sounds. No murmur heard. Pulmonary:     Effort: Pulmonary effort is normal. No respiratory distress.     Breath sounds: Normal breath sounds. No wheezing.  Skin:    Comments: No notable rash on her back. Mild erythema on her mild abdomen with no rash.    RASH Pre- Topical steroid use  Left Upper Back   Right Upper Back    Post-Topical steroid use Right Upper Back   ASSESSMENT/PLAN:   DM2 (diabetes mellitus, type 2) (HCC) Doing well on Wegovy 0.5 mg. Dose increased to 1 mg. Med Refilled. Repeat A1C in August/2024  Overweight Weight trending down with lifestyle modification. Also on Wegovy  Essential hypertension BP looks good on multiple checks despite being elevated at home. I discussed referral to pharmacy for ambulatory BP monitoring. However, she declined this. No medication dose adjustment for now.   Dermatitis Etiology unclear. Rash on her back responded well to betamethasone cream. Skin biopsy canceled. Itchy  abdominal skin without a rash may be due to allergy. I discussed referral to an allergist for skin patch test. She agreed with the plan.      Janit Pagan, MD [facility]

## 2022-07-14 LAB — MICROALBUMIN / CREATININE URINE RATIO
Creatinine, Urine: 18.7 mg/dL
Microalb/Creat Ratio: <16 mg/g creat (ref 0–29)
Microalbumin, Urine: <3 ug/mL

## 2022-07-16 NOTE — Progress Notes (Unsigned)
@Patient  ID: Ardelle Balls, female    DOB: Aug 08, 1960, 62 y.o.   MRN: [id number]  No chief complaint on file.   Referring provider: Doreene Eland, MD  HPI: 62 year old female, former smoker quit 1982.  Past medical history significant for hypertension, migraine headache, allergic rhinitis, sleep apnea, GERD, type 2 diabetes, hyperlipidemia, vit D deficiency   07/17/2022 Patient presents today for sleep consult.        Sleep questionnaire Symptoms-    Prior sleep study-  Bedtime- Time to fall asleep-  Nocturnal awakenings-  Out of bed/start of day-  Weight changes-  Do you operate heavy machinery-  Do you currently wear CPAP-  Do you current wear oxygen-  Epworth-         Allergies  Allergen Reactions   Sulfa Antibiotics Anaphylaxis, Hives and Swelling    Immunization History  Administered Date(s) Administered   Influenza,inj,Quad PF,6+ Mos 12/11/2016, 11/18/2018, 12/18/2019, 12/21/2020, 01/03/2022   Influenza-Unspecified 12/17/2013, 12/28/2015, 12/17/2017   PFIZER Comirnaty(Gray Top)Covid-19 Tri-Sucrose Vaccine 07/08/2020   PFIZER(Purple Top)SARS-COV-2 Vaccination 05/24/2019, 06/24/2019, 11/28/2019   Pneumococcal Polysaccharide-23 04/02/2016   Td 04/11/2009   Tdap 03/04/2020   Zoster Recombinat (Shingrix) 11/09/2021, 01/19/2022    Past Medical History:  Diagnosis Date   Anxiety    Arthritis    neck , right knee > than left   Brain tumor (benign) (HCC) 2018   Chest pain 05/15/2015   Complication of anesthesia    Depression    in the past   Diabetes mellitus without complication (HCC)    Type II   Dizziness 04/26/2020   GERD (gastroesophageal reflux disease)    04/30/2019- "in the past"   H/O varicella    History of measles, mumps, or rubella    HSV-2 (herpes simplex virus 2) infection    Hx of adenomatous polyp of colon 05/15/2022   8 mm adenoma recall 2031   Hx of ovarian cyst    Hyperlipidemia    Hypersomnia 07/19/2015    Hypertension    "sometimes"    Migraine headache    "due to cervical issuses"   Persistent fatigue after COVID-19    Plantar fasciitis    PONV (postoperative nausea and vomiting)    Scapular dyskinesis 10/03/2016   Sleep apnea    pt denies   Sleep deprivation    loss of sleep   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   Counseling given: Not Answered Tobacco comments: She smoked 1/2 pack per day for less than a week when she was 62 yrs old.   Outpatient Medications Prior to Visit  Medication Sig Dispense Refill   betamethasone dipropionate 0.05 % cream Apply topically 2 (two) times daily. (Patient not taking: Reported on 07/13/2022) 30 g 0   blood glucose meter kit and supplies Dispense based on patient and insurance preference. Use once a day as directed. (FOR ICD-10 E10.9, E11.9). 1 each 0   Blood Pressure Monitoring (OMRON 3 SERIES BP  MONITOR) DEVI USE AS DIRECTED 1 each 0   diclofenac (VOLTAREN) 75 MG EC tablet Take 1 tablet (75 mg total) by mouth 2 (two) times daily as needed. 60 tablet 0   Estradiol (VAGIFEM) 10 MCG TABS vaginal tablet Place 1 tablet (10 mcg total) vaginally 2 (two) times a week. 24 tablet 3   estradiol (VIVELLE-DOT) 0.05 MG/24HR patch Place 1 patch (0.05 mg total) onto the skin 2 (two) times a week. 24 patch 3   ezetimibe (ZETIA) 10 MG tablet Take 1 tablet (10 mg total) by mouth daily. 90 tablet 1   fluticasone (FLONASE) 50 MCG/ACT nasal spray Place 1-2 sprays into both nostrils daily. 16 g 12   levocetirizine (XYZAL) 5 MG tablet Take  1 tablet (5 mg total) by mouth every evening. 90 tablet 1   losartan (COZAAR) 25 MG tablet Take 1 tablet (25 mg total) by mouth at bedtime. 90 tablet 1   lubiprostone (AMITIZA) 8 MCG capsule Take 1 capsule (8 mcg total) by mouth 2 (two) times daily with a meal. 60 capsule 0   montelukast (SINGULAIR) 10 MG tablet Take 1 tablet (10 mg total) by mouth at bedtime. 90 tablet 1   pantoprazole (PROTONIX) 40 MG tablet Take 1 tablet (40 mg total) by mouth daily before breakfast. 90 tablet 3   rosuvastatin (CRESTOR) 40 MG tablet Take 1 tablet (40 mg total) by mouth at bedtime. 90 tablet 1   Semaglutide-Weight Management 1 MG/0.5ML SOAJ Inject 1 mg into the skin once a week. 2 mL 0   No facility-administered medications prior to visit.      Review of Systems  Review of Systems   Physical Exam  LMP  (LMP Unknown)  Physical Exam   Lab Results:  CBC    Component Value Date/Time   WBC 5.4 06/26/2022 1113   WBC 7.0 06/23/2022 2215   RBC 4.43 06/26/2022 1113   RBC 4.74 06/23/2022 2215   HGB 12.8 06/26/2022 1113   HGB 12.1 10/14/2013 1123   HCT 39.4 06/26/2022 1113   PLT 269 06/26/2022 1113   MCV 89 06/26/2022 1113   MCH 28.9 06/26/2022 1113   MCH 28.9 06/23/2022 2215   MCHC 32.5 06/26/2022 1113   MCHC 32.0 06/23/2022 2215   RDW 13.4 06/26/2022 1113   LYMPHSABS 2.4 06/26/2022 1113   MONOABS 0.5 06/23/2022 2215   EOSABS 0.1 06/26/2022 1113   BASOSABS 0.0 06/26/2022 1113    BMET    Component Value Date/Time   NA 142 06/26/2022 1113   K 3.8 06/26/2022 1113   CL 105 06/26/2022 1113   CO2 25 06/26/2022 1113   GLUCOSE 72 06/26/2022 1113   GLUCOSE 86 06/23/2022 2123   BUN 8 06/26/2022 1113   CREATININE 0.94 06/26/2022 1113   CREATININE 0.92 07/04/2017 0909   CALCIUM 9.3 06/26/2022 1113   GFRNONAA >60 06/23/2022 2123   GFRNONAA 70 07/04/2017 0909   GFRAA 65 04/26/2020 0920   GFRAA 81 07/04/2017 0909    BNP No results found for: "BNP"  ProBNP No results found for:  "PROBNP"  Imaging: CT Head Wo Contrast  Result Date: 06/23/2022 CLINICAL DATA:  Severe headache EXAM: CT HEAD WITHOUT CONTRAST TECHNIQUE: Contiguous axial images were obtained from the base of the skull through the vertex without intravenous contrast. RADIATION DOSE REDUCTION: This exam was performed according to the departmental dose-optimization program which includes automated exposure control, adjustment of the mA and/or kV according to patient size and/or use of iterative reconstruction technique. COMPARISON:  CT head 03/31/2021 FINDINGS: Brain: No intracranial hemorrhage, mass effect, or evidence of acute infarct. No hydrocephalus. No extra-axial fluid collection. Vascular: No hyperdense vessel or unexpected calcification. Skull: No fracture or focal lesion. Sinuses/Orbits: No acute finding. Paranasal sinuses and mastoid air cells are well aerated. Other: None. IMPRESSION: No acute intracranial process. Electronically Signed   By: Minerva Fester M.D.   On: 06/23/2022 21:49   DG Chest Portable 1 View  Result Date: 06/23/2022 CLINICAL DATA:  Elevated blood pressure and weakness. EXAM: PORTABLE CHEST 1 VIEW COMPARISON:  March 28, 2022 FINDINGS: The heart size and mediastinal contours are within normal limits. Both lungs are clear. A radiopaque fusion plate and screws are seen overlying the lower cervical spine. The visualized skeletal structures are unremarkable. IMPRESSION: No active disease. Electronically Signed   By: Aram Candela M.D.   On: 06/23/2022 21:36     Assessment & Plan:   No problem-specific Assessment & Plan notes found for this encounter.     Glenford Bayley, NP 07/16/2022

## 2022-07-17 ENCOUNTER — Encounter: Payer: Self-pay | Admitting: Primary Care

## 2022-07-17 ENCOUNTER — Ambulatory Visit: Payer: 59 | Admitting: Primary Care

## 2022-07-17 VITALS — BP 126/74 | HR 75 | Temp 98.5°F | Ht 65.0 in | Wt 177.0 lb

## 2022-07-17 DIAGNOSIS — G4730 Sleep apnea, unspecified: Secondary | ICD-10-CM | POA: Diagnosis not present

## 2022-07-17 DIAGNOSIS — R0683 Snoring: Secondary | ICD-10-CM | POA: Insufficient documentation

## 2022-07-17 NOTE — Patient Instructions (Addendum)
  Sleep apnea is defined as period of 10 seconds or longer when you stop breathing at night. This can happen multiple times a night. Dx sleep apnea is when this occurs more than 5 times an hour.    Mild OSA 5-15 apneic events an hour Moderate OSA 15-30 apneic events an hour Severe OSA > 30 apneic events an hour   Untreated sleep apnea puts you at higher risk for cardiac arrhythmias, pulmonary HTN, stroke and diabetes  Treatment options include weight loss, side sleeping position, oral appliance, CPAP therapy or referral to ENT for possible surgical options    Recommendations: Focus on side sleeping position or elevate head with wedge pillow 30 degrees Work on weight loss efforts if able  Do not drive if experiencing excessive daytime sleepiness of fatigue    Orders: Home sleep study re: loud snoring (ordered)   Follow-up: Please call to schedule follow-up 1-2 weeks after completing home sleep study to review results and treatment if needed (can be virtual) 

## 2022-07-17 NOTE — Progress Notes (Signed)
Reviewed and agree with assessment/plan.   Coralyn Helling, MD [facility] Pulmonary/Critical Care 07/17/2022, 4:58 PM Pager:  [phone]

## 2022-07-17 NOTE — Assessment & Plan Note (Signed)
-   Patient has symptoms of loud snoring, witnessed apnea, disrupted sleep and daytime sleepiness.  Epworth score 10/24.  Concern for underlying OSA, needs home sleep study to evaluate.  We discussed risk of untreated sleep apnea including cardiac arrhythmias, pulmonary hypertension, diabetes and stroke.  We also discussed treatment options including weight loss, oral appliance, CPAP therapy referral to ENT for possible surgical options.  Encourage side sleeping position.  Advised against alcohol use or sedatives prior to bedtime as these can worsen underlying sleep apnea.  Advised against driving if experiencing excessive daytime sleepiness or fatigue.  Follow-up 1 to 2 weeks after sleep study review results and treatment options if needed.

## 2022-07-24 ENCOUNTER — Other Ambulatory Visit (HOSPITAL_COMMUNITY): Payer: Self-pay

## 2022-07-26 ENCOUNTER — Encounter: Payer: Self-pay | Admitting: Family Medicine

## 2022-07-27 ENCOUNTER — Other Ambulatory Visit (HOSPITAL_COMMUNITY): Payer: Self-pay

## 2022-07-30 ENCOUNTER — Encounter: Payer: Self-pay | Admitting: Family Medicine

## 2022-07-30 ENCOUNTER — Other Ambulatory Visit (HOSPITAL_COMMUNITY): Payer: Self-pay

## 2022-07-30 ENCOUNTER — Telehealth: Payer: Self-pay

## 2022-07-30 ENCOUNTER — Telehealth: Payer: Self-pay | Admitting: Family Medicine

## 2022-07-30 MED ORDER — SEMAGLUTIDE (1 MG/DOSE) 4 MG/3ML ~~LOC~~ SOPN
1.0000 mg | PEN_INJECTOR | SUBCUTANEOUS | 1 refills | Status: DC
  Filled 2022-07-30: qty 3, 28d supply, fill #0

## 2022-07-30 MED ORDER — OZEMPIC (0.25 OR 0.5 MG/DOSE) 2 MG/3ML ~~LOC~~ SOPN
0.5000 mg | PEN_INJECTOR | SUBCUTANEOUS | 0 refills | Status: DC
  Filled 2022-07-30: qty 3, 28d supply, fill #0

## 2022-07-30 NOTE — Telephone Encounter (Signed)
I called and clarified with the patient. She never started [facility] 1mg  weekly. Hence, I recommended Ozempic 0.5 mg weekly and not 1 mg. I called her pharmacy to cancel the prior refill for Ozempic 1 mg.   I spoke with Aundra Millet at the [facility] outpatient pharmacy who confirmed that the 0.5 mg dose was approved based on the initial prior authorization initially completed by [facility]. I will copy Durward Mallard on this report for awareness's sake. The patient was appreciative of the call.

## 2022-07-30 NOTE — Telephone Encounter (Signed)
Prior Auth for patients medication OZEMPIC approved by [facility] from 07/30/22 to 07/30/23.  CoverMyMeds Key: [id number]

## 2022-07-30 NOTE — Telephone Encounter (Signed)
A Prior Authorization was initiated for this patients OZEMPIC through CoverMyMeds.   Key: [id number]

## 2022-07-31 ENCOUNTER — Other Ambulatory Visit (HOSPITAL_COMMUNITY): Payer: Self-pay

## 2022-08-03 DIAGNOSIS — H5202 Hypermetropia, left eye: Secondary | ICD-10-CM | POA: Diagnosis not present

## 2022-08-03 DIAGNOSIS — H524 Presbyopia: Secondary | ICD-10-CM | POA: Diagnosis not present

## 2022-08-07 ENCOUNTER — Encounter: Payer: Self-pay | Admitting: Family Medicine

## 2022-08-07 LAB — HM DIABETES EYE EXAM

## 2022-08-16 ENCOUNTER — Ambulatory Visit: Payer: 59 | Admitting: Allergy

## 2022-08-16 ENCOUNTER — Other Ambulatory Visit (HOSPITAL_COMMUNITY): Payer: Self-pay

## 2022-08-16 ENCOUNTER — Other Ambulatory Visit: Payer: Self-pay

## 2022-08-16 ENCOUNTER — Encounter: Payer: Self-pay | Admitting: Allergy

## 2022-08-16 VITALS — BP 118/82 | HR 70 | Temp 97.9°F | Ht 65.35 in | Wt 176.7 lb

## 2022-08-16 DIAGNOSIS — H1013 Acute atopic conjunctivitis, bilateral: Secondary | ICD-10-CM | POA: Diagnosis not present

## 2022-08-16 DIAGNOSIS — H109 Unspecified conjunctivitis: Secondary | ICD-10-CM

## 2022-08-16 DIAGNOSIS — J310 Chronic rhinitis: Secondary | ICD-10-CM

## 2022-08-16 DIAGNOSIS — R0981 Nasal congestion: Secondary | ICD-10-CM

## 2022-08-16 DIAGNOSIS — L299 Pruritus, unspecified: Secondary | ICD-10-CM

## 2022-08-16 DIAGNOSIS — L501 Idiopathic urticaria: Secondary | ICD-10-CM | POA: Diagnosis not present

## 2022-08-16 MED ORDER — RYALTRIS 665-25 MCG/ACT NA SUSP: NASAL | 5 refills | Status: DC

## 2022-08-16 MED ORDER — LEVOCETIRIZINE DIHYDROCHLORIDE 5 MG PO TABS
5.0000 mg | ORAL_TABLET | Freq: Two times a day (BID) | ORAL | 5 refills | Status: AC
  Filled 2022-08-16 – 2022-09-11 (×2): qty 60, 30d supply, fill #0
  Filled 2023-02-05: qty 180, 90d supply, fill #0
  Filled 2023-08-15: qty 180, 90d supply, fill #1

## 2022-08-16 MED ORDER — MONTELUKAST SODIUM 10 MG PO TABS
10.0000 mg | ORAL_TABLET | Freq: Every day | ORAL | 5 refills | Status: AC
  Filled 2022-08-16: qty 30, 30d supply, fill #0
  Filled 2023-02-05: qty 90, 90d supply, fill #0
  Filled 2023-08-15: qty 90, 90d supply, fill #1

## 2022-08-16 MED ORDER — FAMOTIDINE 20 MG PO TABS
20.0000 mg | ORAL_TABLET | Freq: Two times a day (BID) | ORAL | 5 refills | Status: DC
  Filled 2022-08-16: qty 60, 30d supply, fill #0

## 2022-08-16 NOTE — Progress Notes (Signed)
New Patient Note  RE: [name] MRN: [id number] DOB: [birth year] Date of Office Visit: 08/16/2022   Primary care provider: Doreene Eland, MD  Chief Complaint: itching  History of present illness: Maleeyah [name] is a 62 y.o. female presenting today for evaluation of itching, heat bumps.   Around late March/April she started itching on her left shoulder area.  Her husband looked at the area and noted a "bunch of little bumps like a heat rash".  She saw her PCP with this rash and was recommended to use a cream.  She feels the cream (betamethasone) wasn't working as the rash had moved to her right shoulder area and on her abdomen.  She has not changed any soaps/lotions/detergents thus she is not sure what is driving the symptoms.  She feels there is something in the home as when she is home she is constantly itching.  She states she does have a blanket that she uses all the time but she has had it for years.  The states the bumps do come and go.  No joint aches/pains, no swelling, no fevers.  She states the area is getting dark with the scratching. She has not had anything like this before.  She states she did have Covid around the end of January.  She has not noted any foods that worsen symptoms.   She does take singulair, xyzal and flonase.  After her first bout with Covid in 2023 she has had nasal drainage ever since. Prior to this she started having allergies with headache, congestion, sinus pressure, sneezing, watery eyes.  She then started taking regimen above and does not really feel its helpful.    Review of systems: Review of Systems  Constitutional: Negative.   HENT:         See HPI  Eyes:        See HPI  Respiratory: Negative.    Cardiovascular: Negative.   Gastrointestinal: Negative.   Musculoskeletal: Negative.   Skin:        See HPI  Allergic/Immunologic: Negative.   Neurological: Negative.     All other systems negative unless noted above in  HPI  Past medical history: Past Medical History:  Diagnosis Date   Anxiety    Arthritis    neck , right knee > than left   Brain tumor (benign) (HCC) 2018   Chest pain 05/15/2015   Complication of anesthesia    Depression    in the past   Diabetes mellitus without complication (HCC)    Type II   Dizziness 04/26/2020   GERD (gastroesophageal reflux disease)    04/30/2019- "in the past"   H/O varicella    History of measles, mumps, or rubella    HSV-2 (herpes simplex virus 2) infection    Hx of adenomatous polyp of colon 05/15/2022   8 mm adenoma recall 2031   Hx of ovarian cyst    Hyperlipidemia    Hypersomnia 07/19/2015   Hypertension    "sometimes"    Migraine headache    "due to cervical issuses"   Persistent fatigue after COVID-19    Plantar fasciitis    PONV (postoperative nausea and vomiting)    Scapular dyskinesis 10/03/2016   Sleep apnea    pt denies   Sleep deprivation    loss of sleep   VAIN I (vaginal intraepithelial neoplasia grade I) 2013   pap and confirmed by colposcopic biopsy    Past surgical history: Past Surgical  History:  Procedure Laterality Date   ABDOMINAL HYSTERECTOMY     ANTERIOR CERVICAL DECOMP/DISCECTOMY FUSION N/A 05/07/2019   Procedure: ANTERIOR CERVICAL DECOMPRESSION FUSION CERVICAL 5-6, CERVICAL 6-7 WITH INSTRUMENTATION AND ALLOGRAFT;  Surgeon: Estill Bamberg, MD;  Location: [facility] OR;  Service: Orthopedics;  Laterality: N/A;   CHOLECYSTECTOMY  11/13/10   COLONOSCOPY  Multiple   DILATION AND CURETTAGE OF UTERUS     ESOPHAGOGASTRODUODENOSCOPY     OOPHORECTOMY  2009   bilateral salpingo-oophorectomy.   SHOULDER ARTHROSCOPY WITH ROTATOR CUFF REPAIR Right 08/03/2019   Procedure: SHOULDER ARTHROSCOPY WITH. ROTATOR CUFF DEBRIDEMENT, SUBACROMIAL DECOMPRESSION, DISTAL CLAVICLE EXCISION;  Surgeon: Jones Broom, MD;  Location: [facility];  Service: Orthopedics;  Laterality: Right;   TUBAL LIGATION      Family history:  Family  History  Problem Relation Age of Onset   Hypertension Mother    Hypotension Mother    Anemia Mother        low iron   Breast cancer Sister 78   Heart disease Sister    Alcohol abuse Brother    Alcohol abuse Brother    Aneurysm Brother    Drug abuse Brother    Sickle cell trait Other    Colon cancer Neg Hx    Esophageal cancer Neg Hx    Stomach cancer Neg Hx     Social history: Lives in a home with carpeting with gas heating and heat pump cooling.  No pets in the home.  No concern for water damage, mildew or roaches in the home.  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  Does not report current smoking history.    Medication List: Current Outpatient Medications  Medication Sig Dispense Refill   betamethasone dipropionate 0.05 % cream Apply topically 2 (two) times daily. 30 g 0   blood glucose meter kit and supplies Dispense based on patient and insurance preference. Use once a day as directed. (FOR ICD-10 E10.9, E11.9). 1 each 0   Blood Pressure Monitoring (OMRON 3 SERIES BP MONITOR) DEVI USE AS DIRECTED 1 each 0   diclofenac (VOLTAREN) 75 MG EC tablet Take 1 tablet (75 mg total) by mouth 2 (two) times daily as needed. 60 tablet 0   Estradiol (VAGIFEM) 10 MCG TABS vaginal tablet Place 1 tablet (10 mcg total) vaginally 2 (two) times a week. 24 tablet 3   estradiol (VIVELLE-DOT) 0.05 MG/24HR patch Place 1 patch (0.05 mg total) onto the skin 2 (two) times a week. 24 patch 3   ezetimibe (ZETIA) 10 MG tablet Take 1 tablet (10 mg total) by mouth daily. 90 tablet 1   fluticasone (FLONASE) 50 MCG/ACT nasal spray Place 1-2 sprays into both nostrils daily. 16 g 12   levocetirizine (XYZAL) 5 MG tablet Take 1 tablet (5 mg total) by mouth every evening. 90 tablet 1   losartan (COZAAR) 25 MG tablet Take 1 tablet (25 mg total) by mouth at bedtime. 90 tablet 1   lubiprostone (AMITIZA) 8 MCG capsule Take 1 capsule (8 mcg total) by mouth 2 (two) times daily with a  meal. 60 capsule 0   montelukast (SINGULAIR) 10 MG tablet Take 1 tablet (10 mg total) by mouth at bedtime. 90 tablet 1   pantoprazole (PROTONIX) 40 MG tablet Take 1 tablet (40 mg total) by mouth daily before breakfast. 90 tablet 3   rosuvastatin (CRESTOR) 40 MG tablet Take 1 tablet (40 mg total) by mouth at bedtime. 90 tablet 1   Semaglutide,0.25  or 0.5MG /DOS, (OZEMPIC, 0.25 OR 0.5 MG/DOSE,) 2 MG/3ML SOPN Inject 0.5 mg into the skin once a week. 3 mL 0   No current facility-administered medications for this visit.    Known medication allergies: Allergies  Allergen Reactions   Sulfa Antibiotics Anaphylaxis, Hives and Swelling     Physical examination: Blood pressure 118/82, pulse 70, temperature 97.9 F (36.6 C), temperature source Temporal, height 5' 5.35" (1.66 m), weight 176 lb 11.2 oz (80.2 kg), SpO2 98 %.  General: Alert, interactive, in no acute distress. HEENT: PERRLA, TMs pearly gray, turbinates non-edematous without discharge, post-pharynx non erythematous. Neck: Supple without lymphadenopathy. Lungs: Clear to auscultation without wheezing, rhonchi or rales. {no increased work of breathing. CV: Normal S1, S2 without murmurs. Abdomen: Nondistended, nontender. Skin: Several hyperpigmented macules on shoulder blade area b/l; erythematous horizontal streaks on abdomen around umbilicus . Extremities:  No clubbing, cyanosis or edema. Neuro:   Grossly intact.  Diagnositics/Labs: Labs:  Component     Latest Ref Rng 06/26/2022 07/13/2022  TIBC     250 - 450 ug/dL 161    UIBC     096 - 045 ug/dL 409    Iron     27 - 811 ug/dL 73    Iron Saturation     15 - 55 % 24    Ferritin     15 - 150 ng/mL 93    Vitamin B12     232 - 1,245 pg/mL 378    Folate     >3.0 ng/mL 3.1    WBC     3.4 - 10.8 x10E3/uL 5.4    RBC     3.77 - 5.28 x10E6/uL 4.43    Hemoglobin     11.1 - 15.9 g/dL 91.4    HCT     78.2 - 46.6 % 39.4    MCV     79 - 97 fL 89    MCH     26.6 - 33.0 pg 28.9     MCHC     31.5 - 35.7 g/dL 95.6    RDW     21.3 - 15.4 % 13.4    Platelets     150 - 450 x10E3/uL 269    Neutrophils     Not Estab. % 45    Lymphs     Not Estab. % 45    Monocytes     Not Estab. % 7    Eos     Not Estab. % 3    Basos     Not Estab. % 0    NEUT#     1.4 - 7.0 x10E3/uL 2.4    Lymphocyte #     0.7 - 3.1 x10E3/uL 2.4    Monocytes Absolute     0.1 - 0.9 x10E3/uL 0.4    EOS (ABSOLUTE)     0.0 - 0.4 x10E3/uL 0.1    Basophils Absolute     0.0 - 0.2 x10E3/uL 0.0    Immature Granulocytes     Not Estab. % 0    Immature Grans (Abs)     0.0 - 0.1 x10E3/uL 0.0    Retic Ct Pct     0.6 - 2.6 % 1.0    nRBC     0.0 - 0.2 %    Lymphocytes     %    Monocytes Relative     %    Monocyte #     0.1 - 1.0 K/uL    Eosinophil     %  Eosinophils Absolute     0.0 - 0.5 K/uL    Basophil     %    Abs Immature Granulocytes     0.00 - 0.07 K/uL    Glucose     70 - 99 mg/dL 72    BUN     8 - 27 mg/dL 8    Creatinine     1.61 - 1.00 mg/dL 0.96    eGFR     >04 VW/UJW/1.19 69    BUN/Creatinine Ratio     12 - 28  9 (L)    Sodium     134 - 144 mmol/L 142    Potassium     3.5 - 5.2 mmol/L 3.8    Chloride     96 - 106 mmol/L 105    CO2     20 - 29 mmol/L 25    Calcium     8.7 - 10.3 mg/dL 9.3    Total Protein     6.0 - 8.5 g/dL 6.5    Albumin     3.8 - 4.9 g/dL 4.1    Globulin, Total     1.5 - 4.5 g/dL 2.4    Albumin/Globulin Ratio     1.2 - 2.2  1.7    Total Bilirubin     0.0 - 1.2 mg/dL 0.3    Alkaline Phosphatase     44 - 121 IU/L 99    AST     0 - 40 IU/L 33    ALT     0 - 32 IU/L 42 (H)    Cholesterol, Total     100 - 199 mg/dL 147    Triglycerides     0 - 149 mg/dL 66    HDL Cholesterol     >39 mg/dL 44    VLDL Cholesterol Cal     5 - 40 mg/dL 14    LDL Chol Calc (NIH)     0 - 99 mg/dL 65    Total CHOL/HDL Ratio     0.0 - 4.4 ratio 2.8    Creatinine, Urine     Not Estab. mg/dL  82.9   Microalbumin, Urine     Not Estab. 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  Your symptoms have been ongoing for >6 weeks making this chronic thus will obtain labwork to evaluate: tryptase, hive panel, environmental panel, alpha-gal panel, inflammatory markers.  - you have had a largely  unremarkable lab work-up done by your PCP thus far - for control of symptoms recommend you try a high-dose antihistamine regimen: Xyzal 5mg  1 tab twice a day with Pepcid 20mg  1 tab twice a day. You do not need to stop your Protonix while taking Pepcid as they work differently for reflux control - topical therapies are usually not very effective for urticaria-like rashes  Rhinoconjunctivitis - will obtain environmental allergy panel as above - continue Xyzal and Singulair - will change Flonase to Ryaltris nasal spray 2 sprays each nostril twice a day for runny or stuffy nose.  Ryaltris is a combination nasal spray with steroid component for congestion control and antihistamine component for drainage control  Follow-up in 2-3 months or sooner if needed   I appreciate the opportunity to take part in Xiana's care. Please do not hesitate to contact me with  questions.  Sincerely,   Margo Aye, MD Allergy/Immunology [facility]

## 2022-08-16 NOTE — Patient Instructions (Signed)
Itchy, bumpy rash - description of rash sounds like cholinergic urticaria which can present as itchy, fine bumpy rash that is usually temperature driven - itchy, hive-like rash can be caused by a variety of different triggers including illness/infection, foods, medications, stings, exercise, pressure, vibrations, extremes of temperature to name a few however majority of the time there is no identifiable trigger.  Your symptoms have been ongoing for >6 weeks making this chronic thus will obtain labwork to evaluate: tryptase, hive panel, environmental panel, alpha-gal panel, inflammatory markers.  - you have had a largely unremarkable lab work-up done by your PCP thus far - for control of symptoms recommend you try a high-dose antihistamine regimen: Xyzal 5mg  1 tab twice a day with Pepcid 20mg  1 tab twice a day. You do not need to stop your Protonix while taking Pepcid as they work differently for reflux control - topical therapies are usually not very effective for urticaria-like rashes  Environmental allergies - will obtain environmental allergy panel as above - continue Xyzal and Singulair - will change Flonase to Ryaltris nasal spray 2 sprays each nostril twice a day for runny or stuffy nose.  Ryaltris is a combination nasal spray with steroid component for congestion control and antihistamine component for drainage control  Follow-up in 2-3 months or sooner if needed

## 2022-08-16 NOTE — Addendum Note (Signed)
Addended by: Kellie Simmering, Thatcher Doberstein on: 08/16/2022 10:13 AM   Modules accepted: Orders

## 2022-08-16 NOTE — Addendum Note (Signed)
Addended by: Dollene Cleveland R on: 08/16/2022 11:19 AM   Modules accepted: Orders

## 2022-08-17 ENCOUNTER — Ambulatory Visit (INDEPENDENT_AMBULATORY_CARE_PROVIDER_SITE_OTHER): Payer: 59 | Admitting: Primary Care

## 2022-08-17 DIAGNOSIS — G4710 Hypersomnia, unspecified: Secondary | ICD-10-CM | POA: Diagnosis not present

## 2022-08-17 DIAGNOSIS — G4730 Sleep apnea, unspecified: Secondary | ICD-10-CM

## 2022-08-17 LAB — ALLERGENS W/TOTAL IGE AREA 2

## 2022-08-18 LAB — ALLERGENS W/TOTAL IGE AREA 2

## 2022-08-18 LAB — CHRONIC URTICARIA

## 2022-08-18 LAB — THYROID ANTIBODIES: Thyroperoxidase Ab SerPl-aCnc: 10 IU/mL (ref 0–34)

## 2022-08-18 LAB — ANA W/REFLEX: Anti Nuclear Antibody (ANA): NEGATIVE

## 2022-08-18 LAB — ALPHA-GAL PANEL

## 2022-08-18 LAB — TRYPTASE: Tryptase: 8.2 ug/L (ref 2.2–13.2)

## 2022-08-19 LAB — ALLERGENS W/TOTAL IGE AREA 2
Bermuda Grass IgE: <0.1 kU/L
Cat Dander IgE: <0.1 kU/L
Cottonwood IgE: <0.1 kU/L
Dog Dander IgE: <0.1 kU/L
Maple/Box Elder IgE: <0.1 kU/L
Sheep Sorrel IgE Qn: <0.1 kU/L
White Mulberry IgE: <0.1 kU/L

## 2022-08-19 LAB — THYROID ANTIBODIES: Thyroglobulin Antibody: <1 IU/mL (ref 0.0–0.9)

## 2022-08-19 LAB — ALPHA-GAL PANEL
Allergen Lamb IgE: <0.1 kU/L
Pork IgE: <0.1 kU/L

## 2022-08-20 ENCOUNTER — Other Ambulatory Visit (HOSPITAL_COMMUNITY): Payer: Self-pay

## 2022-08-20 NOTE — Progress Notes (Signed)
Please let patient know home sleep study was negative for obstructive sleep apnea.  She had a total of 10 apneas.  This came out to an average index score of 1.9 apneas an hour.  Diagnosis of obstructive sleep apnea is AHI greater than 5/h.  She had no significant oxygen desaturations.  Encourage weight loss, side sleeping position and avoid alcohol use prior to bedtime. Recommend following up with PCP regarding fatigue symptoms. If she would like to go over sleep study in more detail have her keep appointment end of June with me.

## 2022-08-22 ENCOUNTER — Other Ambulatory Visit (HOSPITAL_COMMUNITY): Payer: Self-pay

## 2022-08-22 ENCOUNTER — Other Ambulatory Visit: Payer: Self-pay | Admitting: Family Medicine

## 2022-08-22 ENCOUNTER — Telehealth: Payer: Self-pay | Admitting: Family Medicine

## 2022-08-22 MED ORDER — SEMAGLUTIDE (1 MG/DOSE) 4 MG/3ML ~~LOC~~ SOPN
1.0000 mg | PEN_INJECTOR | SUBCUTANEOUS | 1 refills | Status: DC
  Filled 2022-08-22: qty 3, 28d supply, fill #0
  Filled 2022-09-06 – 2022-09-11 (×2): qty 3, 28d supply, fill #1

## 2022-08-22 NOTE — Telephone Encounter (Signed)
Called to check on patient. She tolerated Ozempic 0.5 mg weekly injection.  Plan to increase to 1 mg QD. She agreed with the plan. New script sent to her pharmacy.

## 2022-08-24 ENCOUNTER — Telehealth: Payer: Self-pay | Admitting: Primary Care

## 2022-08-24 LAB — ALPHA-GAL PANEL
Beef IgE: <0.1 kU/L
O215-IgE Alpha-Gal: <0.1 kU/L

## 2022-08-24 LAB — ALLERGENS W/TOTAL IGE AREA 2
D Farinae IgE: <0.1 kU/L
D Pteronyssinus IgE: <0.1 kU/L
Elm, American IgE: <0.1 kU/L
Johnson Grass IgE: <0.1 kU/L
Mouse Urine IgE: <0.1 kU/L
Oak, White IgE: <0.1 kU/L
Pecan, Hickory IgE: <0.1 kU/L
Pigweed, Rough IgE: <0.1 kU/L
Ragweed, Short IgE: <0.1 kU/L

## 2022-08-24 LAB — SEDIMENTATION RATE: Sed Rate: 7 mm/hr (ref 0–40)

## 2022-08-28 NOTE — Telephone Encounter (Signed)
Called and spoke with pt, to go over results. Pt verbalized understanding, requesting for appointment to be canceled. Nothing further needed

## 2022-08-31 ENCOUNTER — Telehealth: Payer: Self-pay | Admitting: Allergy

## 2022-08-31 NOTE — Telephone Encounter (Signed)
Patient called to get lab results and requested a call back.

## 2022-08-31 NOTE — Telephone Encounter (Signed)
Pt informed we do not have results back yet  and we will call when we get results back

## 2022-09-06 ENCOUNTER — Other Ambulatory Visit (HOSPITAL_COMMUNITY): Payer: Self-pay

## 2022-09-06 ENCOUNTER — Telehealth: Payer: Self-pay | Admitting: Internal Medicine

## 2022-09-06 ENCOUNTER — Other Ambulatory Visit: Payer: Self-pay

## 2022-09-06 MED ORDER — LUBIPROSTONE 24 MCG PO CAPS
24.0000 ug | ORAL_CAPSULE | Freq: Two times a day (BID) | ORAL | 5 refills | Status: DC
  Filled 2022-09-06: qty 60, 30d supply, fill #0

## 2022-09-06 NOTE — Telephone Encounter (Signed)
Spoke with patient regarding MD recommendations. Pt verbalized all understanding. 

## 2022-09-06 NOTE — Telephone Encounter (Signed)
Let her know I have rxed higher dose amitiza 24 micrograms bid  If this is not helping w/in 1-2 mos she should let me know - she may book a next available OV ifdesired also

## 2022-09-11 ENCOUNTER — Other Ambulatory Visit (HOSPITAL_COMMUNITY): Payer: Self-pay

## 2022-09-11 ENCOUNTER — Other Ambulatory Visit: Payer: Self-pay | Admitting: Family Medicine

## 2022-09-11 ENCOUNTER — Encounter: Payer: Self-pay | Admitting: Family Medicine

## 2022-09-11 ENCOUNTER — Other Ambulatory Visit: Payer: Self-pay

## 2022-09-11 MED ORDER — BETAMETHASONE DIPROPIONATE 0.05 % EX CREA
TOPICAL_CREAM | Freq: Two times a day (BID) | CUTANEOUS | 0 refills | Status: DC
  Filled 2022-09-11: qty 45, 30d supply, fill #0

## 2022-09-12 ENCOUNTER — Other Ambulatory Visit (HOSPITAL_COMMUNITY): Payer: Self-pay

## 2022-09-12 ENCOUNTER — Other Ambulatory Visit: Payer: Self-pay | Admitting: Family Medicine

## 2022-09-12 MED ORDER — OZEMPIC (2 MG/DOSE) 8 MG/3ML ~~LOC~~ SOPN
2.0000 mg | PEN_INJECTOR | SUBCUTANEOUS | 1 refills | Status: DC
  Filled 2022-09-12: qty 3, 28d supply, fill #0

## 2022-09-14 ENCOUNTER — Other Ambulatory Visit (HOSPITAL_COMMUNITY): Payer: Self-pay

## 2022-09-14 ENCOUNTER — Ambulatory Visit: Payer: 59 | Admitting: Primary Care

## 2022-09-21 ENCOUNTER — Encounter: Payer: Self-pay | Admitting: Family Medicine

## 2022-09-21 ENCOUNTER — Other Ambulatory Visit (HOSPITAL_COMMUNITY): Payer: Self-pay

## 2022-09-21 ENCOUNTER — Ambulatory Visit (INDEPENDENT_AMBULATORY_CARE_PROVIDER_SITE_OTHER): Payer: 59 | Admitting: Family Medicine

## 2022-09-21 VITALS — BP 100/75 | HR 72 | Ht 65.0 in | Wt 173.2 lb

## 2022-09-21 DIAGNOSIS — M5416 Radiculopathy, lumbar region: Secondary | ICD-10-CM | POA: Diagnosis not present

## 2022-09-21 DIAGNOSIS — E663 Overweight: Secondary | ICD-10-CM

## 2022-09-21 DIAGNOSIS — M62838 Other muscle spasm: Secondary | ICD-10-CM | POA: Diagnosis not present

## 2022-09-21 DIAGNOSIS — E1169 Type 2 diabetes mellitus with other specified complication: Secondary | ICD-10-CM

## 2022-09-21 MED ORDER — MOUNJARO 2.5 MG/0.5ML ~~LOC~~ SOAJ
2.5000 mg | SUBCUTANEOUS | 1 refills | Status: DC
  Filled 2022-09-21: qty 2, 28d supply, fill #0
  Filled 2022-10-17: qty 2, 28d supply, fill #1

## 2022-09-21 MED ORDER — OXYCODONE HCL 5 MG PO TABS
5.0000 mg | ORAL_TABLET | Freq: Two times a day (BID) | ORAL | 0 refills | Status: AC | PRN
  Filled 2022-09-21: qty 14, 7d supply, fill #0

## 2022-09-21 MED ORDER — BACLOFEN 10 MG PO TABS
10.0000 mg | ORAL_TABLET | Freq: Two times a day (BID) | ORAL | 0 refills | Status: DC | PRN
  Filled 2022-09-21: qty 30, 15d supply, fill #0

## 2022-09-21 NOTE — Assessment & Plan Note (Signed)
She is on maximum dose of Ozempic She'll like to get off Ozempic and Trial Mounjaro for her DM and weight management D/C Ozempic and start Mounjaro 1 week after the last dose of Ozempic We will titrate Mounjaro as needed

## 2022-09-21 NOTE — Assessment & Plan Note (Signed)
Etiology unclear Differentials includes TMJ disorder vs Poor posturing Baclofen prn spasm prescribed Consider checking electrolytes in the future if this persists

## 2022-09-21 NOTE — Patient Instructions (Signed)
Back Exercises These exercises help to make your trunk and back strong. They also help to keep the lower back flexible. Doing these exercises can help to prevent or lessen pain in your lower back. If you have back pain, try to do these exercises 2-3 times each day or as told by your doctor. As you get better, do the exercises once each day. Repeat the exercises more often as told by your doctor. To stop back pain from coming back, do the exercises once each day, or as told by your doctor. Do exercises exactly as told by your doctor. Stop right away if you feel sudden pain or your pain gets worse. Exercises Single knee to chest Do these steps 3-5 times in a row for each leg: Lie on your back on a firm bed or the floor with your legs stretched out. Bring one knee to your chest. Grab your knee or thigh with both hands and hold it in place. Pull on your knee until you feel a gentle stretch in your lower back or butt. Keep doing the stretch for 10-30 seconds. Slowly let go of your leg and straighten it. Pelvic tilt Do these steps 5-10 times in a row: Lie on your back on a firm bed or the floor with your legs stretched out. Bend your knees so they point up to the ceiling. Your feet should be flat on the floor. Tighten your lower belly (abdomen) muscles to press your lower back against the floor. This will make your tailbone point up to the ceiling instead of pointing down to your feet or the floor. Stay in this position for 5-10 seconds while you gently tighten your muscles and breathe evenly. Cat-cow Do these steps until your lower back bends more easily: Get on your hands and knees on a firm bed or the floor. Keep your hands under your shoulders, and keep your knees under your hips. You may put padding under your knees. Let your head hang down toward your chest. Tighten (contract) the muscles in your belly. Point your tailbone toward the floor so your lower back becomes rounded like the back of a  cat. Stay in this position for 5 seconds. Slowly lift your head. Let the muscles of your belly relax. Point your tailbone up toward the ceiling so your back forms a sagging arch like the back of a cow. Stay in this position for 5 seconds.  Press-ups Do these steps 5-10 times in a row: Lie on your belly (face-down) on a firm bed or the floor. Place your hands near your head, about shoulder-width apart. While you keep your back relaxed and keep your hips on the floor, slowly straighten your arms to raise the top half of your body and lift your shoulders. Do not use your back muscles. You may change where you place your hands to make yourself more comfortable. Stay in this position for 5 seconds. Keep your back relaxed. Slowly return to lying flat on the floor.  Bridges Do these steps 10 times in a row: Lie on your back on a firm bed or the floor. Bend your knees so they point up to the ceiling. Your feet should be flat on the floor. Your arms should be flat at your sides, next to your body. Tighten your butt muscles and lift your butt off the floor until your waist is almost as high as your knees. If you do not feel the muscles working in your butt and the back of   your thighs, slide your feet 1-2 inches (2.5-5 cm) farther away from your butt. Stay in this position for 3-5 seconds. Slowly lower your butt to the floor, and let your butt muscles relax. If this exercise is too easy, try doing it with your arms crossed over your chest. Belly crunches Do these steps 5-10 times in a row: Lie on your back on a firm bed or the floor with your legs stretched out. Bend your knees so they point up to the ceiling. Your feet should be flat on the floor. Cross your arms over your chest. Tip your chin a little bit toward your chest, but do not bend your neck. Tighten your belly muscles and slowly raise your chest just enough to lift your shoulder blades a tiny bit off the floor. Avoid raising your body  higher than that because it can put too much stress on your lower back. Slowly lower your chest and your head to the floor. Back lifts Do these steps 5-10 times in a row: Lie on your belly (face-down) with your arms at your sides, and rest your forehead on the floor. Tighten the muscles in your legs and your butt. Slowly lift your chest off the floor while you keep your hips on the floor. Keep the back of your head in line with the curve in your back. Look at the floor while you do this. Stay in this position for 3-5 seconds. Slowly lower your chest and your face to the floor. Contact a doctor if: Your back pain gets a lot worse when you do an exercise. Your back pain does not get better within 2 hours after you exercise. If you have any of these problems, stop doing the exercises. Do not do them again unless your doctor says it is okay. Get help right away if: You have sudden, very bad back pain. If this happens, stop doing the exercises. Do not do them again unless your doctor says it is okay. This information is not intended to replace advice given to you by your health care provider. Make sure you discuss any questions you have with your health care provider. Document Revised: 05/18/2020 Document Reviewed: 05/18/2020 Elsevier Patient Education  2024 [employer]  

## 2022-09-21 NOTE — Progress Notes (Signed)
    SUBJECTIVE:   CHIEF COMPLAINT / HPI:   Neck spasm: C/O spasm of her left side neck muscle after yawning. Her first episode was about a month ago. Each episode will last a few seconds and self-resolve or resolve with massaging. No neck swelling or pain. No trauma to her neck.  Back pain:  She slipped while going down the step at home and almost fell on her back 3 weeks ago. She caught herself before she could land on her back. Since then, she has experienced low back pain about 10/10 in severity, which radiates to her left LL.  She used Tylenol prn pain and Biofreeze with no improvement. She took her husband's Hydrocodone one time with minimal improvement.   DM/Weight management: Ozempic not helping with her weight loss. Her to discuss alternative option.  PERTINENT  PMH / PSH: PMHx reviewed  OBJECTIVE:   BP 100/75   Pulse 72   Ht 5\' 5"  (1.651 m)   Wt 173 lb 3.2 oz (78.6 kg)   LMP  (LMP Unknown)   SpO2 100%   BMI 28.82 kg/m   Physical Exam Vitals and nursing note reviewed.  Cardiovascular:     Rate and Rhythm: Normal rate and regular rhythm.     Heart sounds: Normal heart sounds. No murmur heard. Pulmonary:     Effort: Pulmonary effort is normal. No respiratory distress.     Breath sounds: Normal breath sounds. No wheezing.  Musculoskeletal:     Cervical back: Normal range of motion and neck supple. No rigidity or tenderness.     Lumbar back: Tenderness present. Decreased range of motion.  Lymphadenopathy:     Cervical: No cervical adenopathy.  Neurological:     General: No focal deficit present.     Motor: No weakness.     Gait: Gait normal.      ASSESSMENT/PLAN:   DM2 (diabetes mellitus, type 2) (HCC) She is on maximum dose of Ozempic She'll like to get off Ozempic and Trial Mounjaro for her DM and weight management D/C Ozempic and start Mounjaro 1 week after the last dose of Ozempic We will titrate Mounjaro as needed  Overweight Gaining weight on  Ozempic Will like to trial Mounjaro D/C Ozempic and start Mounjaro 1 week after the last dose Monitor closely on regimen  Lumbar radiculopathy Acute on chronic f/u resent trauma She might have also pulled a muscle while trying to catch herself from falling Home exercise instruction provided Baclofen muscle relaxant prn pain  Also a short course of Oxycodone prescribed for breakthrough pain Consider imaging and PT referral in the future She agreed with the plan  Neck muscle spasm Etiology unclear Differentials includes TMJ disorder vs Poor posturing Baclofen prn spasm prescribed Consider checking electrolytes in the future if this persists     Janit Pagan, MD [facility]

## 2022-09-21 NOTE — Assessment & Plan Note (Signed)
Acute on chronic f/u resent trauma She might have also pulled a muscle while trying to catch herself from falling Home exercise instruction provided Baclofen muscle relaxant prn pain  Also a short course of Oxycodone prescribed for breakthrough pain Consider imaging and PT referral in the future She agreed with the plan

## 2022-09-21 NOTE — Assessment & Plan Note (Signed)
Gaining weight on Ozempic Will like to trial Mounjaro D/C Ozempic and start Mounjaro 1 week after the last dose Monitor closely on regimen

## 2022-09-28 ENCOUNTER — Ambulatory Visit: Payer: 59 | Admitting: Allergy

## 2022-10-18 ENCOUNTER — Other Ambulatory Visit (HOSPITAL_COMMUNITY): Payer: Self-pay

## 2022-11-01 ENCOUNTER — Encounter: Payer: Self-pay | Admitting: Family Medicine

## 2022-11-02 ENCOUNTER — Encounter: Payer: Self-pay | Admitting: Family Medicine

## 2022-11-02 ENCOUNTER — Ambulatory Visit (INDEPENDENT_AMBULATORY_CARE_PROVIDER_SITE_OTHER): Payer: 59 | Admitting: Family Medicine

## 2022-11-02 VITALS — BP 129/81 | HR 68 | Ht 65.0 in | Wt 172.2 lb

## 2022-11-02 DIAGNOSIS — E1169 Type 2 diabetes mellitus with other specified complication: Secondary | ICD-10-CM | POA: Diagnosis not present

## 2022-11-02 DIAGNOSIS — I10 Essential (primary) hypertension: Secondary | ICD-10-CM | POA: Diagnosis not present

## 2022-11-02 DIAGNOSIS — C719 Malignant neoplasm of brain, unspecified: Secondary | ICD-10-CM

## 2022-11-02 DIAGNOSIS — E663 Overweight: Secondary | ICD-10-CM | POA: Diagnosis not present

## 2022-11-02 DIAGNOSIS — M5416 Radiculopathy, lumbar region: Secondary | ICD-10-CM

## 2022-11-02 LAB — POCT GLYCOSYLATED HEMOGLOBIN (HGB A1C): HbA1c, POC (controlled diabetic range): 5.8 % (ref 0.0–7.0)

## 2022-11-02 MED ORDER — KETOROLAC TROMETHAMINE 30 MG/ML IJ SOLN
30.0000 mg | Freq: Once | INTRAMUSCULAR | Status: AC
Start: 2022-11-02 — End: 2022-11-07

## 2022-11-02 MED ORDER — GABAPENTIN 100 MG PO CAPS
100.0000 mg | ORAL_CAPSULE | Freq: Three times a day (TID) | ORAL | 1 refills | Status: DC
  Filled 2022-11-02: qty 90, 30d supply, fill #0

## 2022-11-02 MED ORDER — KETOROLAC TROMETHAMINE 30 MG/ML IJ SOLN
30.0000 mg | Freq: Once | INTRAMUSCULAR | Status: AC
Start: 2022-11-02 — End: 2022-11-02
  Administered 2022-11-02: 30 mg via INTRAMUSCULAR

## 2022-11-02 NOTE — Progress Notes (Unsigned)
    SUBJECTIVE:   CHIEF COMPLAINT / HPI:   HTN:  DM2:  Overweight:  Back: Oxycodone 6/10  PERTINENT  PMH / PSH: ***  OBJECTIVE:   BP (!) 128/90   Pulse 68   Ht 5\' 5"  (1.651 m)   Wt 172 lb 3.2 oz (78.1 kg)   LMP  (LMP Unknown)   SpO2 100%   BMI 28.66 kg/m   ***  ASSESSMENT/PLAN:   No problem-specific Assessment & Plan notes found for this encounter.     Janit Pagan, MD [facility]

## 2022-11-02 NOTE — Patient Instructions (Signed)
Radicular Pain Radicular pain is a type of pain that spreads from your back or neck along a spinal nerve. Spinal nerves are nerves that leave the spinal cord and go to the muscles. Radicular pain is sometimes called radiculopathy, radiculitis, or a pinched nerve. When you have this type of pain, you may also have weakness, numbness, or tingling in the area of your body that is supplied by the nerve. The pain may feel sharp and burning. Depending on which spinal nerve is affected, the pain may occur in the: Neck area (cervical radicular pain). You may also feel pain, numbness, weakness, or tingling in the arms. Mid-spine area (thoracic radicular pain). You would feel this pain in the back and chest. This type is rare. Lower back area (lumbar radicular pain). You would feel this pain as low back pain. You may feel pain, numbness, weakness, or tingling in the buttocks or legs. Sciatica is a type of lumbar radicular pain that shoots down the back of the leg. Radicular pain occurs when one of the spinal nerves becomes irritated or squeezed (compressed). It is often caused by something pushing on a spinal nerve, such as one of the bones of the spine (vertebrae) or one of the round cushions between vertebrae (intervertebral disks). This can result from: An injury. Wear and tear or aging of a disk. The growth of a bone spur that pushes on the nerve. Radicular pain often goes away when you follow instructions from your health care provider for relieving pain at home. How is this treated? Treatment may depend on the cause of the condition and may include: Working with a physical therapist. Taking pain medicine. Applying heat or ice or both to the affected areas. Doing stretches to improve flexibility. Having surgery. This may be needed if other treatments do not help. Different types of surgery may be done depending on the cause of this condition. Follow these instructions at home: Managing pain     If  directed, put ice on the affected area. To do this: Put ice in a plastic bag. Place a towel between your skin and the bag. Leave the ice on for 20 minutes, 2-3 times a day. Remove the ice if your skin turns bright red. This is very important. If you cannot feel pain, heat, or cold, you have a greater risk of damage to the area. If directed, apply heat to the affected area as often as told by your health care provider. Use the heat source that your health care provider recommends, such as a moist heat pack or a heating pad. Place a towel between your skin and the heat source. Leave the heat on for 20-30 minutes. Remove the heat if your skin turns bright red. This is especially important if you are unable to feel pain, heat, or cold. You have a greater risk of getting burned. Activity Do not sit or rest in bed for long periods of time. Try to stay as active as possible. Ask your health care provider what type of exercise or activity is best for you. Avoid activities that make your pain worse, such as bending and lifting. You may have to avoid lifting. Ask your health care provider how much you can safely lift. Practice using proper technique when lifting items. Proper lifting technique involves bending your knees and rising up. Do strength and range-of-motion exercises only as told by your health care provider or physical therapist. General instructions Take over-the-counter and prescription medicines only as told by your  health care provider. Pay attention to any changes in your symptoms. Keep all follow-up visits. This is important. Contact a health care provider if: Your pain and other symptoms get worse. Your pain medicine is not helping. Your pain has not improved after a few weeks of home care. You have a fever. Get help right away if: You have severe pain, weakness, or numbness. You have difficulty with bladder or bowel control. Summary Radicular pain is a type of pain that spreads  from your back or neck along a spinal nerve. When you have radicular pain, you may also have weakness, numbness, or tingling in the area of your body that is supplied by the nerve. The pain may feel sharp or burning. Radicular pain may be treated with ice, heat, medicines, or physical therapy. This information is not intended to replace advice given to you by your health care provider. Make sure you discuss any questions you have with your health care provider. Document Revised: 09/08/2020 Document Reviewed: 09/08/2020 Elsevier Patient Education  2024 [employer].

## 2022-11-03 MED ORDER — TIRZEPATIDE 5 MG/0.5ML ~~LOC~~ SOAJ
5.0000 mg | SUBCUTANEOUS | 1 refills | Status: DC
  Filled 2022-11-03 – 2022-11-20 (×2): qty 2, 28d supply, fill #0

## 2022-11-03 NOTE — Assessment & Plan Note (Signed)
A1C at goal To give a bit of weight loss benefit, her Greggory Keen was increased to 5 mg every day F/U in 3-4 months

## 2022-11-03 NOTE — Assessment & Plan Note (Signed)
Pain persists or worsening D/C Oxy and resume Gabapentin 100 mg TID MRI ordered While scheduling her MTI during this visit she stated to hold off for now till she trial Gabapentin I then offered xray. However,she wants to defer all imaging till after her husband's surgery Deferring PT for now as well F/U soon if there is no symptoms improvement

## 2022-11-03 NOTE — Assessment & Plan Note (Signed)
Maintain Mounjaro at 5 mg Qweekly and improve diet and exercise as tolerated

## 2022-11-03 NOTE — Assessment & Plan Note (Signed)
Diastolic BP was initially elevated but improved with recheck Continue current dose of Losartan Continue home BP monitoring

## 2022-11-03 NOTE — Assessment & Plan Note (Signed)
Stable

## 2022-11-05 ENCOUNTER — Other Ambulatory Visit: Payer: Self-pay

## 2022-11-05 ENCOUNTER — Other Ambulatory Visit (HOSPITAL_COMMUNITY): Payer: Self-pay

## 2022-11-13 ENCOUNTER — Encounter: Payer: Self-pay | Admitting: Family Medicine

## 2022-11-13 ENCOUNTER — Other Ambulatory Visit (HOSPITAL_COMMUNITY): Payer: Self-pay

## 2022-11-13 ENCOUNTER — Other Ambulatory Visit: Payer: Self-pay | Admitting: Family Medicine

## 2022-11-13 MED ORDER — IBUPROFEN 600 MG PO TABS
600.0000 mg | ORAL_TABLET | Freq: Three times a day (TID) | ORAL | 0 refills | Status: DC | PRN
  Filled 2022-11-13: qty 60, 20d supply, fill #0

## 2022-11-13 NOTE — Addendum Note (Signed)
Addended by: Janit Pagan T on: 11/13/2022 03:12 PM   Modules accepted: Orders

## 2022-11-16 ENCOUNTER — Ambulatory Visit: Payer: 59 | Admitting: Allergy

## 2022-11-20 ENCOUNTER — Other Ambulatory Visit (HOSPITAL_COMMUNITY): Payer: Self-pay

## 2022-12-14 ENCOUNTER — Encounter: Payer: Self-pay | Admitting: Family Medicine

## 2022-12-14 ENCOUNTER — Other Ambulatory Visit (HOSPITAL_COMMUNITY): Payer: Self-pay

## 2022-12-14 ENCOUNTER — Telehealth: Payer: Self-pay | Admitting: Family Medicine

## 2022-12-14 ENCOUNTER — Telehealth (INDEPENDENT_AMBULATORY_CARE_PROVIDER_SITE_OTHER): Payer: 59 | Admitting: Family Medicine

## 2022-12-14 VITALS — BP 135/89

## 2022-12-14 DIAGNOSIS — J310 Chronic rhinitis: Secondary | ICD-10-CM

## 2022-12-14 DIAGNOSIS — M25551 Pain in right hip: Secondary | ICD-10-CM | POA: Diagnosis not present

## 2022-12-14 DIAGNOSIS — M5416 Radiculopathy, lumbar region: Secondary | ICD-10-CM

## 2022-12-14 DIAGNOSIS — M545 Low back pain, unspecified: Secondary | ICD-10-CM

## 2022-12-14 MED ORDER — TRAMADOL HCL 50 MG PO TABS
50.0000 mg | ORAL_TABLET | Freq: Two times a day (BID) | ORAL | 1 refills | Status: AC | PRN
  Filled 2022-12-14: qty 60, 30d supply, fill #0
  Filled 2023-04-29: qty 60, 30d supply, fill #1

## 2022-12-14 MED ORDER — AZELASTINE-FLUTICASONE 137-50 MCG/ACT NA SUSP
1.0000 | Freq: Two times a day (BID) | NASAL | 1 refills | Status: DC
Start: 2022-12-14 — End: 2023-06-10
  Filled 2022-12-14 – 2022-12-18 (×2): qty 23, 30d supply, fill #0
  Filled 2023-04-07: qty 23, 30d supply, fill #1

## 2022-12-14 MED ORDER — IBUPROFEN 600 MG PO TABS
600.0000 mg | ORAL_TABLET | Freq: Three times a day (TID) | ORAL | 1 refills | Status: DC | PRN
  Filled 2022-12-14: qty 60, 20d supply, fill #0
  Filled 2023-04-07: qty 60, 20d supply, fill #1

## 2022-12-14 NOTE — Assessment & Plan Note (Signed)
Continue Gabapentin and Ibuprofen as it is Ibuprofen refilled I started her on Tramadol 50 mg prn for breakthrough pain Lumbar spine xray reordered She'll go get her xray soon.

## 2022-12-14 NOTE — Telephone Encounter (Signed)
HIPAA compliant call message left.  Check with her if she is able to come in for her appointment or switch to virtual.

## 2022-12-14 NOTE — Assessment & Plan Note (Signed)
Continue Gabapentin and Ibuprofen as it is Ibuprofen refilled I started her on Tramadol 50 mg prn for breakthrough pain Right hip xray reordered She'll go get her xray soon.

## 2022-12-14 NOTE — Assessment & Plan Note (Signed)
D/C Ryaltris Start Dymista Continue oral antihistamine Tylenol or Ibuprofen as needed for sinus pain F/U soon if no improvement

## 2022-12-14 NOTE — Progress Notes (Signed)
[facility] Telemedicine Visit  Patient consented to have virtual visit and was identified by name and date of birth. Method of visit: Video  Encounter participants: Patient: [name] - located at Home Provider: Janit Pagan - located at [facility] Office Others (if applicable): N/A  Chief Complaint: Pain  HPI:   Lumbar/Right hip pain: Unchanged from her baseline. Her pain is no longer responding to Ibuprofen 600 mg prn and Gabapentin. She has not used her Baclofen. Pain is worse this morning due to the weather.   Headache:   C/O para-sinus headache around her nose bridge associated with c clear nasal drainage x 1 week. She has been sneezing a lot as well. She was previously on Flonase, but there has been no improvement in the past. Her allergist recently started her on Ryaltris, but she discontinued it due to intolerance. She uses Tylenol as needed for pain. She denies fever or chills.   ROS: per HPI  Pertinent PMHx: PMHx reviewed  Exam:  BP 135/89   LMP  (LMP Unknown)   Physical Exam Vitals and nursing note reviewed.  Pulmonary:     Effort: Pulmonary effort is normal. No respiratory distress.  Musculoskeletal:     Comments: Unable to assess  Neurological:     Mental Status: She is alert and oriented to person, place, and time.      Assessment/Plan:  Lumbar radiculopathy Continue Gabapentin and Ibuprofen as it is Ibuprofen refilled I started her on Tramadol 50 mg prn for breakthrough pain Lumbar spine xray reordered She'll go get her xray soon.  Right hip pain Continue Gabapentin and Ibuprofen as it is Ibuprofen refilled I started her on Tramadol 50 mg prn for breakthrough pain Right hip xray reordered She'll go get her xray soon.  Chronic rhinitis D/C Ryaltris Start Dymista Continue oral antihistamine Tylenol or Ibuprofen as needed for sinus pain F/U soon if no improvement    Time spent during visit with patient: 30  minutes

## 2022-12-14 NOTE — Patient Instructions (Signed)
Hip Exercises Ask your health care provider which exercises are safe for you. Do exercises exactly as told by your provider and adjust them as told. It is normal to feel mild stretching, pulling, tightness, or discomfort as you do these exercises. Stop right away if you feel sudden pain or your pain gets worse. Do not begin these exercises until told by your provider. Stretching and range-of-motion exercises These exercises warm up your muscles and joints and improve the movement and flexibility of your hip. They also help to relieve pain, numbness, and tingling. You may be asked to limit your range of motion if you had a hip replacement. Talk to your provider about these limits. Hamstrings, supine  Lie on your back (supine position). Loop a belt, towel, or exercise band over the ball of your left / right foot. The ball of your foot is on the walking surface, right under your toes. Straighten your left / right knee and slowly pull on the belt, towel, or band to raise your leg until you feel a gentle stretch behind your knee (hamstring). Do not let your knee bend while you do this. Keep your other leg flat on the floor. Hold this position for __________ seconds. Slowly return your leg to the starting position. Repeat __________ times. Complete this exercise __________ times a day. Hip rotation  Lie on your back on a firm surface. With your left / right hand, gently pull your left / right knee toward the shoulder that is on the same side of the body. Stop when your knee is pointing toward the ceiling. Hold your left / right ankle with your other hand. Keeping your knee steady, gently pull your left / right ankle toward your other shoulder until you feel a stretch in your butt. Keep your hips and shoulders firmly planted while you do this stretch. Hold this position for __________ seconds. Repeat __________ times. Complete this exercise __________ times a day. Seated stretch This exercise is  sometimes called hamstrings and adductors stretch. Sit on the floor with your legs stretched wide. Keep your knees straight during this exercise. Keeping your head and back in a straight line, bend at your waist to reach for your left foot (position A). You should feel a stretch in your right inner thigh (adductors). Hold this position for __________ seconds. Then slowly return to the upright position. Keeping your head and back in a straight line, bend at your waist to reach forward (position B). You should feel a stretch behind both of your thighs and knees (hamstrings). Hold this position for __________ seconds. Then slowly return to the upright position. Keeping your head and back in a straight line, bend at your waist to reach for your right foot (position C). You should feel a stretch in your left inner thigh (adductors). Hold this position for __________ seconds. Then slowly return to the upright position. Repeat __________ times. Complete this exercise __________ times a day. Lunge This exercise stretches the muscles of the hip (hip flexors). Place your left / right knee on the floor and bend your other knee so that is directly over your ankle. You should be half-kneeling. Keep good posture with your head over your shoulders. Tighten your butt muscles to point your tailbone downward. This will prevent your back from arching too much. You should feel a gentle stretch in the front of your left / right thigh and hip. If you do not feel a stretch, slide your other foot forward slightly and  then slowly lunge forward with your chest up until your knee once again lines up over your ankle. Make sure your tailbone continues to point downward. Hold this position for __________ seconds. Slowly return to the starting position. Repeat __________ times. Complete this exercise __________ times a day. Strengthening exercises These exercises build strength and endurance in your hip. Endurance is the  ability to use your muscles for a long time, even after they get tired. Bridge This exercise strengthens the muscles of your hip (hip extensors). Lie on your back on a firm surface with your knees bent and your feet flat on the floor. Tighten your butt muscles and lift your bottom off the floor until the trunk of your body and your hips are level with your thighs. Do not arch your back. You should feel the muscles working in your butt and the back of your thighs. If you do not feel these muscles, slide your feet 1-2 inches (2.5-5 cm) farther away from your butt. Hold this position for __________ seconds. Slowly lower your hips to the starting position. Let your muscles relax completely between repetitions. Repeat __________ times. Complete this exercise __________ times a day. Straight leg raises, side-lying This exercise strengthens the muscles that move the hip joint away from the center of the body (hip abductors). Lie on your side with your left / right leg in the top position. Lie so your head, shoulder, hip, and knee line up. You may bend your bottom knee slightly to help you balance. Roll your hips slightly forward, so your hips are stacked directly over each other and your left / right knee is facing forward. Leading with your heel, lift your top leg 4-6 inches (10-15 cm). You should feel the muscles in your top hip lifting. Do not let your foot drift forward. Do not let your knee roll toward the ceiling. Hold this position for __________ seconds. Slowly return to the starting position. Let your muscles relax completely between repetitions. Repeat __________ times. Complete this exercise __________ times a day. Straight leg raises, side-lying This exercise strengthens the muscles that move the hip joint toward the center of the body (hip adductors). Lie on your side with your left / right leg in the bottom position. Lie so your head, shoulder, hip, and knee line up. You may place your  upper foot in front to help you balance. Roll your hips slightly forward, so your hips are stacked directly over each other and your left / right knee is facing forward. Tense the muscles in your inner thigh and lift your bottom leg 4-6 inches (10-15 cm). Hold this position for __________ seconds. Slowly return to the starting position. Let your muscles relax completely between repetitions. Repeat __________ times. Complete this exercise __________ times a day. Straight leg raises, supine This exercise strengthens the muscles in the front of your thigh (quadriceps and hip flexors). Lie on your back (supine position) with your left / right leg extended and your other knee bent. Tense the muscles in the front of your left / right thigh. You should see your kneecap slide up or see increased dimpling just above your knee. Keep these muscles tight as you raise your leg 4-6 inches (10-15 cm) off the floor. Do not let your knee bend. Hold this position for __________ seconds. Keep these muscles tense as you lower your leg. Relax the muscles slowly and completely between repetitions. Repeat __________ times. Complete this exercise __________ times a day. Hip abductors, standing This  exercise strengthens the muscles that move the leg and hip joint away from the center of the body (hip abductors). Tie one end of a rubber exercise band or tubing to a secure surface, such as a chair, table, or pole. Loop the other end of the band or tubing around your left / right ankle. Keeping your ankle with the band or tubing directly opposite the secured end, step away until there is tension in the tubing or band. Hold on to a chair, table, or pole as needed for balance. Lift your left / right leg out to your side. While you do this: Keep your back upright. Keep your shoulders over your hips. Keep your toes pointing forward. Make sure to use your hip muscles to slowly lift your leg. Do not tip your body or  forcefully lift your leg. Hold this position for __________ seconds. Slowly return to the starting position. Repeat __________ times. Complete this exercise __________ times a day. Squats This exercise strengthens the muscles in the front of your thigh (quadriceps). Stand in front of a table, or stand in a doorframe so your feet and knees are in line with the frame. You may place your hands on the table or frame for balance. Slowly bend your knees and lower your hips like you are going to sit in a chair. Keep your lower legs in a straight up-and-down position. Do not let your hips go lower than your knees. Do not bend your knees lower than told by your provider. If your hip pain increases, do not bend as low. Hold this position for ___________ seconds. Slowly push with your legs to return to standing. Do not use your hands to pull yourself to standing. Repeat __________ times. Complete this exercise __________ times a day. This information is not intended to replace advice given to you by your health care provider. Make sure you discuss any questions you have with your health care provider. Document Revised: 11/07/2021 Document Reviewed: 11/07/2021 Elsevier Patient Education  2024 [employer].

## 2022-12-17 ENCOUNTER — Ambulatory Visit (HOSPITAL_COMMUNITY)
Admission: RE | Admit: 2022-12-17 | Discharge: 2022-12-17 | Disposition: A | Discharge status: Home / Self Care | Payer: 59 | Source: Ambulatory Visit | Attending: Family Medicine | Admitting: Family Medicine

## 2022-12-17 ENCOUNTER — Other Ambulatory Visit: Payer: Self-pay | Admitting: Family Medicine

## 2022-12-17 ENCOUNTER — Other Ambulatory Visit (HOSPITAL_COMMUNITY): Payer: Self-pay | Admitting: Family Medicine

## 2022-12-17 ENCOUNTER — Telehealth: Payer: Self-pay

## 2022-12-17 ENCOUNTER — Encounter (HOSPITAL_COMMUNITY): Payer: Self-pay

## 2022-12-17 DIAGNOSIS — M545 Low back pain, unspecified: Secondary | ICD-10-CM

## 2022-12-17 DIAGNOSIS — M25551 Pain in right hip: Secondary | ICD-10-CM | POA: Diagnosis not present

## 2022-12-17 DIAGNOSIS — M5416 Radiculopathy, lumbar region: Secondary | ICD-10-CM

## 2022-12-17 DIAGNOSIS — G8929 Other chronic pain: Secondary | ICD-10-CM | POA: Diagnosis not present

## 2022-12-17 DIAGNOSIS — J310 Chronic rhinitis: Secondary | ICD-10-CM

## 2022-12-17 DIAGNOSIS — M47816 Spondylosis without myelopathy or radiculopathy, lumbar region: Secondary | ICD-10-CM | POA: Diagnosis not present

## 2022-12-17 NOTE — Telephone Encounter (Signed)
Pharmacy Patient Advocate Encounter   Received notification from CoverMyMeds that prior authorization for Azelastine-Fluticasone 137-50 MCG/ACT SUSP  is required/requested.    PA required; PA submitted to [facility] via CoverMyMeds Key/confirmation #/EOC [id number] Status is pending

## 2022-12-18 ENCOUNTER — Encounter: Payer: Self-pay | Admitting: Family Medicine

## 2022-12-18 ENCOUNTER — Other Ambulatory Visit (HOSPITAL_COMMUNITY): Payer: Self-pay

## 2022-12-21 ENCOUNTER — Other Ambulatory Visit (HOSPITAL_COMMUNITY): Payer: Self-pay

## 2022-12-21 ENCOUNTER — Encounter: Payer: Self-pay | Admitting: Family Medicine

## 2022-12-21 ENCOUNTER — Ambulatory Visit: Payer: 59 | Admitting: Family Medicine

## 2022-12-21 VITALS — BP 128/89 | HR 82 | Ht 65.0 in | Wt 173.0 lb

## 2022-12-21 DIAGNOSIS — Z23 Encounter for immunization: Secondary | ICD-10-CM

## 2022-12-21 DIAGNOSIS — M5416 Radiculopathy, lumbar region: Secondary | ICD-10-CM

## 2022-12-21 DIAGNOSIS — Z7985 Long-term (current) use of injectable non-insulin antidiabetic drugs: Secondary | ICD-10-CM

## 2022-12-21 DIAGNOSIS — E1169 Type 2 diabetes mellitus with other specified complication: Secondary | ICD-10-CM

## 2022-12-21 LAB — POCT GLYCOSYLATED HEMOGLOBIN (HGB A1C): HbA1c, POC (prediabetic range): 5.7 % (ref 5.7–6.4)

## 2022-12-21 NOTE — Progress Notes (Signed)
    SUBJECTIVE:   CHIEF COMPLAINT / HPI:   Back/Hip pain: Here for follow-up. Tramadol makes her sleep, although it provides some relief to her pain. She had an x-ray done and wanted to discuss her test results.  DM2: Compliant with meds. Here for f/u.  PERTINENT  PMH / PSH: PMhx reviewed  OBJECTIVE:   BP 128/89   Pulse 82   Ht 5\' 5"  (1.651 m)   Wt 173 lb (78.5 kg)   LMP  (LMP Unknown)   SpO2 97%   BMI 28.79 kg/m   Physical Exam Vitals and nursing note reviewed.  Cardiovascular:     Rate and Rhythm: Normal rate and regular rhythm.     Heart sounds: Normal heart sounds. No murmur heard. Pulmonary:     Effort: Pulmonary effort is normal. No respiratory distress.     Breath sounds: Normal breath sounds. No wheezing.  Musculoskeletal:     Lumbar back: Tenderness present.      ASSESSMENT/PLAN:   Lumbar radiculopathy Lumbar spine and hip xray completed Result pending at the time of the visit As discussed with her, I will contact her soon as I have her test results May use Tramadol 1/2 tablet prn alternating with Ibuprofen or Tylenol She agreed with the plan  DM2 (diabetes mellitus, type 2) (HCC) She is doing well on Mounjaro 5 mg weekly A1C was inadvertently checked sooner than usual I recommended test cancellation, but she said she'd like to know what it is A1C is 5.7, close to the previous one of 5.8 No medication adjustment is needed for now Repeat A1C in 6 months   Vaccine updated today - Flu shot, PCV20 and COVID 19 vaccination  Janit Pagan, MD [facility]

## 2022-12-21 NOTE — Assessment & Plan Note (Signed)
Lumbar spine and hip xray completed Result pending at the time of the visit As discussed with her, I will contact her soon as I have her test results May use Tramadol 1/2 tablet prn alternating with Ibuprofen or Tylenol She agreed with the plan

## 2022-12-21 NOTE — Assessment & Plan Note (Signed)
She is doing well on Mounjaro 5 mg weekly A1C was inadvertently checked sooner than usual I recommended test cancellation, but she said she'd like to know what it is A1C is 5.7, close to the previous one of 5.8 No medication adjustment is needed for now Repeat A1C in 6 months

## 2023-01-07 ENCOUNTER — Other Ambulatory Visit (HOSPITAL_COMMUNITY): Payer: Self-pay

## 2023-01-09 ENCOUNTER — Other Ambulatory Visit (HOSPITAL_COMMUNITY): Payer: Self-pay

## 2023-01-09 MED ORDER — TIRZEPATIDE 5 MG/0.5ML ~~LOC~~ SOAJ
5.0000 mg | SUBCUTANEOUS | 1 refills | Status: DC
  Filled 2023-01-09: qty 2, 28d supply, fill #0
  Filled 2023-02-05: qty 2, 28d supply, fill #1
  Filled 2023-04-07: qty 2, 28d supply, fill #2

## 2023-01-09 NOTE — Addendum Note (Signed)
Addended by: Janit Pagan T on: 01/09/2023 08:59 AM   Modules accepted: Orders

## 2023-01-16 NOTE — Telephone Encounter (Signed)
,  Pharmacy Patient Advocate Encounter  Received notification from [facility] that Prior Authorization for Azelastine-Fluticasone 137-50MCG/ACT suspension has been APPROVED from 12/18/22 to 12/17/23   PA #/Case ID/Reference #: [id number]

## 2023-01-21 ENCOUNTER — Other Ambulatory Visit: Payer: Self-pay | Admitting: Family Medicine

## 2023-01-21 DIAGNOSIS — Z1231 Encounter for screening mammogram for malignant neoplasm of breast: Secondary | ICD-10-CM

## 2023-01-25 ENCOUNTER — Ambulatory Visit: Payer: 59 | Admitting: Student

## 2023-01-25 ENCOUNTER — Ambulatory Visit (INDEPENDENT_AMBULATORY_CARE_PROVIDER_SITE_OTHER): Payer: 59 | Admitting: Student

## 2023-01-25 ENCOUNTER — Other Ambulatory Visit (HOSPITAL_COMMUNITY): Payer: Self-pay

## 2023-01-25 VITALS — BP 127/80 | HR 75 | Ht 65.0 in | Wt 169.8 lb

## 2023-01-25 DIAGNOSIS — C719 Malignant neoplasm of brain, unspecified: Secondary | ICD-10-CM | POA: Diagnosis not present

## 2023-01-25 DIAGNOSIS — H9201 Otalgia, right ear: Secondary | ICD-10-CM

## 2023-01-25 DIAGNOSIS — D3190 Benign neoplasm of unspecified part of unspecified eye: Secondary | ICD-10-CM

## 2023-01-25 DIAGNOSIS — R519 Headache, unspecified: Secondary | ICD-10-CM | POA: Diagnosis not present

## 2023-01-25 MED ORDER — AMOXICILLIN 500 MG PO CAPS
500.0000 mg | ORAL_CAPSULE | Freq: Two times a day (BID) | ORAL | 0 refills | Status: AC
  Filled 2023-01-25: qty 14, 7d supply, fill #0

## 2023-01-25 NOTE — Progress Notes (Unsigned)
    SUBJECTIVE:   CHIEF COMPLAINT / HPI:   Headaches:  -CT head from 06/23/22 negative  Headache started ***  Pain is *** Keeps from doing:  *** Medications tried: *** Patient thinks cause of headache might be: ***  Head trauma: *** Sudden onset: *** Previous similar headaches: *** Taking blood thinners: *** History of cancer: ***  Symptoms Nose congestion stuffiness:  *** Nausea vomiting: *** Photophobia: *** Noise sensitivity: *** Double vision or loss of vision: *** Fever: *** Neck Stiffness: *** Trouble walking or speaking: ***   Right Ear Pain  -No drainage  -Aching pain x 4 days    PERTINENT  PMH / PSH: ***  OBJECTIVE:   BP 127/80   Pulse 75   Ht 5\' 5"  (1.651 m)   Wt 169 lb 12.8 oz (77 kg)   LMP  (LMP Unknown)   SpO2 100%   BMI 28.26 kg/m   General: Alert and oriented in no apparent distress Heart: Regular rate and rhythm with no murmurs appreciated Lungs: CTA bilaterally, no wheezing Abdomen: Bowel sounds present, no abdominal pain Skin: Warm and dry Extremities: No lower extremity edema   ASSESSMENT/PLAN:   No problem-specific Assessment & Plan notes found for this encounter.     Alfredo Martinez, MD [facility]

## 2023-01-25 NOTE — Progress Notes (Deleted)
  SUBJECTIVE:   CHIEF COMPLAINT / HPI:   Headache and Ear infection  PERTINENT  PMH / PSH: ***  Past Medical History:  Diagnosis Date   Anxiety    Arthritis    neck , right knee > than left   Brain tumor (benign) (HCC) 2018   Chest pain 05/15/2015   Complication of anesthesia    COVID-19 virus infection 10/15/2020   Depression    in the past   Diabetes mellitus without complication (HCC)    Type II   Dizziness 04/26/2020   GERD (gastroesophageal reflux disease)    04/30/2019- "in the past"   H/O varicella    History of measles, mumps, or rubella    HSV-2 (herpes simplex virus 2) infection    Hx of adenomatous polyp of colon 05/15/2022   8 mm adenoma recall 2031   Hx of ovarian cyst    Hyperlipidemia    Hypersomnia 07/19/2015   Hypertension    "sometimes"    Migraine headache    "due to cervical issuses"   Persistent fatigue after COVID-19    Plantar fasciitis    PONV (postoperative nausea and vomiting)    Scapular dyskinesis 10/03/2016   Sleep apnea    pt denies   Sleep deprivation    loss of sleep   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 Bess Kinds, MD 01/25/2023, 12:35 PM PGY-***, [facility] {    This will disappear when note is signed, click to select method of visit    :1}

## 2023-01-25 NOTE — Patient Instructions (Addendum)
It was great to see you today! Thank you for choosing [facility] for your primary care.  Today we addressed: We will check a brain MR We will call to scheduled F/u with neurology Please take Amoxicillin 500 mg BID x7days for ear infection  If you haven't already, sign up for My Chart to have easy access to your labs results, and communication with your primary care physician. I recommend that you always bring your medications to each appointment as this makes it easy to ensure you are on the correct medications and helps [place] not miss refills when you need them. Call the clinic at [phone] if your symptoms worsen or you have any concerns. Return in about 4 weeks (around 02/22/2023). Please arrive 15 minutes before your appointment to ensure smooth check in process.  We appreciate your efforts in making this happen.  Thank you for allowing me to participate in your care, Alfredo Martinez, MD 01/25/2023, 3:39 PM PGY-3, [facility]

## 2023-01-26 NOTE — Assessment & Plan Note (Signed)
In the setting of a change in balance and new onset of symptoms, will order MRI for further assessment.

## 2023-01-31 ENCOUNTER — Other Ambulatory Visit (HOSPITAL_COMMUNITY): Payer: 59

## 2023-02-05 ENCOUNTER — Encounter: Payer: Self-pay | Admitting: Family Medicine

## 2023-02-05 ENCOUNTER — Other Ambulatory Visit: Payer: Self-pay | Admitting: Family Medicine

## 2023-02-05 ENCOUNTER — Other Ambulatory Visit: Payer: Self-pay

## 2023-02-05 ENCOUNTER — Other Ambulatory Visit (HOSPITAL_COMMUNITY): Payer: Self-pay

## 2023-02-05 MED ORDER — ROSUVASTATIN CALCIUM 40 MG PO TABS
40.0000 mg | ORAL_TABLET | Freq: Every day | ORAL | 1 refills | Status: DC
  Filled 2023-02-05: qty 90, 90d supply, fill #0
  Filled 2023-04-29: qty 90, 90d supply, fill #1

## 2023-02-05 MED ORDER — LOSARTAN POTASSIUM 25 MG PO TABS
25.0000 mg | ORAL_TABLET | Freq: Every day | ORAL | 1 refills | Status: DC
  Filled 2023-02-05: qty 90, 90d supply, fill #0
  Filled 2023-04-29: qty 90, 90d supply, fill #1

## 2023-02-05 MED ORDER — EZETIMIBE 10 MG PO TABS
10.0000 mg | ORAL_TABLET | Freq: Every day | ORAL | 1 refills | Status: DC
  Filled 2023-02-05: qty 90, 90d supply, fill #0
  Filled 2023-04-29: qty 90, 90d supply, fill #1

## 2023-02-05 NOTE — Telephone Encounter (Signed)
Spoke with patient made appt for 11/22 at 3:50. Aquilla Solian, CMA

## 2023-02-06 NOTE — Progress Notes (Signed)
62 y.o. [id number] Married [place] American female here for annual exam.    Dealing with headaches.  Just had an MRI done.   Taking ERT and Vagifem and wants to continue.   Not taking Gabapentin, prescribed for back pain.   PCP: Doreene Eland, MD   No LMP recorded (lmp unknown). Patient has had a hysterectomy.           Sexually active: Yes.    The current method of family planning is status post hysterectomy.    Menopausal hormone therapy:  Vivelle-dot patch Exercising: Yes.     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   Mammogram:   02/08/23 Breast Density Cat C, BI-RADS CAT 1 neg Colonoscopy:  06/13/22 - polyp - due in 7 years Bone Density:  n/a  Result  n/a   Immunization History  Administered Date(s) Administered   Influenza, Seasonal, Injecte, Preservative Fre 12/21/2022   Influenza,inj,Quad PF,6+ Mos 12/11/2016, 11/18/2018, 12/18/2019, 12/21/2020, 01/03/2022   Influenza-Unspecified 12/17/2013, 12/28/2015, 12/17/2017   PFIZER Comirnaty(Gray Top)Covid-19 Tri-Sucrose Vaccine 07/08/2020   PFIZER(Purple Top)SARS-COV-2 Vaccination 05/24/2019, 06/24/2019, 11/28/2019   PNEUMOCOCCAL CONJUGATE-20 12/21/2022   Pfizer(Comirnaty)Fall Seasonal Vaccine 12 years and older 12/21/2022   Pneumococcal Polysaccharide-23 04/02/2016   Td 04/11/2009   Tdap 03/04/2020   Zoster Recombinant(Shingrix) 11/09/2021, 01/19/2022      reports that she quit smoking about 42 years ago. Her smoking use included cigarettes. She started smoking about 43 years ago. She has a 0.5 pack-year smoking history. She has never  used smokeless tobacco. She reports current alcohol use. She reports that she does not use drugs.  Past Medical History:  Diagnosis Date   Anxiety    Arthritis    neck , right knee > than left   Brain tumor (benign) (HCC) 2018   Chest pain 05/15/2015   Complication of anesthesia    COVID-19 virus infection 10/15/2020   Depression    in the past   Diabetes mellitus without complication (HCC)    Type II   Dizziness 04/26/2020   GERD (gastroesophageal reflux disease)    04/30/2019- "in the past"   H/O varicella    History of measles, mumps, or rubella    HSV-2 (herpes simplex virus 2) infection    Hx of adenomatous polyp of colon 05/15/2022   8 mm adenoma recall 2031   Hx of ovarian cyst    Hyperlipidemia    Hypersomnia 07/19/2015   Hypertension    "sometimes"    Migraine headache    "due to cervical issuses"   Persistent fatigue after COVID-19    Plantar fasciitis    PONV (postoperative nausea and vomiting)    Scapular dyskinesis 10/03/2016   Sleep apnea    pt denies   Sleep deprivation    loss of sleep   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   SHOULDER ARTHROSCOPY WITH ROTATOR CUFF REPAIR Right 08/03/2019   Procedure: SHOULDER ARTHROSCOPY WITH. ROTATOR CUFF DEBRIDEMENT, SUBACROMIAL DECOMPRESSION, DISTAL CLAVICLE EXCISION;  Surgeon: Jones Broom, MD;  Location: [facility];   Service: Orthopedics;  Laterality: Right;   TUBAL LIGATION      Current Outpatient Medications  Medication Sig Dispense Refill   Azelastine-Fluticasone 137-50 MCG/ACT SUSP Place 1 spray into both nostrils 2 (two) times daily. 23 g 1   blood glucose meter kit and supplies Dispense based on patient and insurance preference. Use once a day as directed. (FOR ICD-10 E10.9, E11.9). 1 each 0   Blood Pressure Monitoring (OMRON 3 SERIES BP MONITOR) DEVI USE AS DIRECTED 1 each 0   estradiol (VIVELLE-DOT) 0.05 MG/24HR patch Place 1 patch (0.05 mg total) onto the skin 2 (two) times a week. 24 patch 3   ezetimibe (ZETIA) 10 MG tablet Take 1 tablet (10 mg total) by mouth daily. 90 tablet 1   gabapentin (NEURONTIN) 100 MG capsule Take 1 capsule (100 mg total) by mouth 3 (three) times daily. 90 capsule 1   ibuprofen (ADVIL) 600 MG tablet Take 1 tablet (600 mg total) by mouth every 8 (eight) hours as needed for moderate pain. 60 tablet 1   levocetirizine (XYZAL) 5 MG tablet Take 1 tablet (5 mg total) by mouth in the morning and at bedtime. 60 tablet 5   losartan (COZAAR) 25 MG tablet Take 1 tablet (25 mg total) by mouth at bedtime. 90 tablet 1   montelukast (SINGULAIR) 10 MG tablet Take 1 tablet (10 mg total) by mouth at bedtime. 30 tablet 5   ondansetron (ZOFRAN-ODT) 4 MG disintegrating tablet Take 1 tablet (4 mg total) by mouth every 8 (eight) hours as needed for nausea or vomiting. 30 tablet 2   pantoprazole (PROTONIX) 40 MG tablet Take 1 tablet (40 mg total) by mouth daily before breakfast. 90 tablet 3   rosuvastatin (CRESTOR) 40 MG tablet Take 1 tablet (40 mg total) by mouth at bedtime. 90 tablet 1   tirzepatide (MOUNJARO) 5 MG/0.5ML Pen Inject 5 mg into the skin once a week. 4 mL 1   lubiprostone (AMITIZA) 24 MCG capsule Take 1 capsule (24 mcg total) by mouth 2 (two) times daily with a meal. (Patient not taking: Reported on 11/02/2022) 60 capsule 5   No current facility-administered medications for this  visit.    ALLERGIES: Sulfa antibiotics  Family History  Problem Relation Age of Onset   Hypertension Mother    Hypotension Mother    Anemia Mother        low iron   Breast cancer Sister 6   Heart disease Sister    Alcohol abuse Brother    Alcohol abuse Brother    Aneurysm Brother    Drug abuse Brother    Sickle cell trait Other    Colon cancer Neg Hx    Esophageal  cancer Neg Hx    Stomach cancer Neg Hx     Review of Systems  All other systems reviewed and are negative.   PHYSICAL EXAM:  BP 112/72 (BP Location: Right Arm, Patient Position: Sitting, Cuff Size: Normal)   Pulse 78   Ht 5\' 4"  (1.626 m)   Wt 169 lb (76.7 kg)   LMP  (LMP Unknown)   SpO2 98%   BMI 29.01 kg/m     General appearance: alert, cooperative and appears stated age Head: normocephalic, without obvious abnormality, atraumatic Neck: no adenopathy, supple, symmetrical, trachea midline and thyroid normal to inspection and palpation Lungs: clear to auscultation bilaterally Breasts: normal appearance, no masses or tenderness, No nipple retraction or dimpling, No nipple discharge or bleeding, No axillary adenopathy Heart: regular rate and rhythm Abdomen: soft, non-tender; no masses, no organomegaly Extremities: extremities normal, atraumatic, no cyanosis or edema Skin: skin color, texture, turgor normal. No rashes or lesions Lymph nodes: cervical, supraclavicular, and axillary nodes normal. Neurologic: grossly normal  Pelvic: External genitalia:  no lesions              No abnormal inguinal nodes palpated.              Urethra:  normal appearing urethra with no masses, tenderness or lesions              Bartholins and Skenes: normal                 Vagina: normal appearing vagina with normal color and discharge, no lesions              Cervix: absent              Pap taken: No. Bimanual Exam:  Uterus:  absent              Adnexa: no mass, fullness, tenderness              Rectal exam: No.  Declined  Chaperone was present for exam:  Warren Lacy, CMA  ASSESSMENT: Well woman visit with gynecologic exam Status post hysterectomy.  Status post BSO.  ERT. Vaginal atrophy. Medication monitoring.  Hx VAIN I 2013.  Hx HSV 2.   PLAN: Mammogram screening discussed. Self breast awareness reviewed. Pap and HRV collected:  No. Guidelines for Calcium, Vitamin D, regular exercise program including cardiovascular and weight bearing exercise. We reviewed ERT and potential increased risk of stroke, PE, and DVT. Medication refills:  Vivelle Dot 0.05 mg twice weekly and Vagifem restart.  Rx for one year. Follow up:  1 year and prn.

## 2023-02-07 NOTE — Progress Notes (Signed)
Virtual Visit via Video Note  I connected with [name] on 02/07/23 at  3:50 PM EST by a video enabled telemedicine application and verified that I am speaking with the correct person using two identifiers.  Location: Patient: [name] - home Provider: Elberta Fortis, DO -[facility]   I discussed the limitations of evaluation and management by telemedicine and the availability of in person appointments. The patient expressed understanding and agreed to proceed.  History of Present Illness: Patient reports ongoing daily headaches associated with some nausea that do not improve with Tylenol.  Reports history of migraines but states this feels different.  History of COVID x 2.  Associated with some lightheadedness and dizziness.  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  Patient requesting Zofran for nausea management as it has worked for her previously.   Observations/Objective: Normal mood and affect Nonlabored breathing Alert, interacting appropriately  Assessment and Plan: Assessment & Plan Nonintractable headache, unspecified chronicity pattern, unspecified headache type Chronic, uncontrolled with conservative therapy.  Discussed utilization of migraine medicine, patient declined as this feels different than the migraine.  Discussed referral to neurology, patient would like to see MRI results first. -Rx Zofran ODT for nausea management -Follow-up with PCP regarding MRI results and consideration of neurology referral   Follow Up Instructions: -Follow-up with PCP once MRI results available    I discussed the assessment and treatment plan with the patient. The patient was provided an opportunity to ask questions and all were answered. The patient agreed with the plan and demonstrated an understanding of the instructions.   The patient was advised to call back or seek an in-person evaluation if the symptoms worsen or if  the condition fails to improve as anticipated.   Elberta Fortis, MD

## 2023-02-08 ENCOUNTER — Ambulatory Visit
Admission: RE | Admit: 2023-02-08 | Discharge: 2023-02-08 | Disposition: A | Discharge status: Home / Self Care | Payer: 59 | Source: Ambulatory Visit | Attending: Family Medicine | Admitting: Family Medicine

## 2023-02-08 ENCOUNTER — Ambulatory Visit: Payer: 59

## 2023-02-08 ENCOUNTER — Ambulatory Visit (HOSPITAL_COMMUNITY)
Admission: RE | Admit: 2023-02-08 | Discharge: 2023-02-08 | Disposition: A | Discharge status: Home / Self Care | Payer: 59 | Source: Ambulatory Visit | Attending: Family Medicine | Admitting: Family Medicine

## 2023-02-08 ENCOUNTER — Other Ambulatory Visit (HOSPITAL_COMMUNITY): Payer: Self-pay

## 2023-02-08 ENCOUNTER — Telehealth (INDEPENDENT_AMBULATORY_CARE_PROVIDER_SITE_OTHER): Payer: 59 | Admitting: Family Medicine

## 2023-02-08 DIAGNOSIS — C719 Malignant neoplasm of brain, unspecified: Secondary | ICD-10-CM | POA: Diagnosis not present

## 2023-02-08 DIAGNOSIS — I6782 Cerebral ischemia: Secondary | ICD-10-CM | POA: Diagnosis not present

## 2023-02-08 DIAGNOSIS — Z1231 Encounter for screening mammogram for malignant neoplasm of breast: Secondary | ICD-10-CM

## 2023-02-08 DIAGNOSIS — D3190 Benign neoplasm of unspecified part of unspecified eye: Secondary | ICD-10-CM | POA: Diagnosis not present

## 2023-02-08 DIAGNOSIS — R519 Headache, unspecified: Secondary | ICD-10-CM | POA: Diagnosis not present

## 2023-02-08 MED ORDER — ONDANSETRON 4 MG PO TBDP
4.0000 mg | ORAL_TABLET | Freq: Three times a day (TID) | ORAL | 2 refills | Status: DC | PRN
  Filled 2023-02-08: qty 30, 10d supply, fill #0
  Filled 2023-04-26 (×2): qty 30, 10d supply, fill #1

## 2023-02-08 MED ORDER — GADOBUTROL 1 MMOL/ML IV SOLN
7.5000 mL | Freq: Once | INTRAVENOUS | Status: AC | PRN
  Administered 2023-02-08: 7.5 mL via INTRAVENOUS

## 2023-02-11 ENCOUNTER — Encounter: Payer: Self-pay | Admitting: Family Medicine

## 2023-02-19 ENCOUNTER — Encounter: Payer: Self-pay | Admitting: Family Medicine

## 2023-02-20 ENCOUNTER — Ambulatory Visit (INDEPENDENT_AMBULATORY_CARE_PROVIDER_SITE_OTHER): Payer: 59 | Admitting: Obstetrics and Gynecology

## 2023-02-20 ENCOUNTER — Encounter (HOSPITAL_COMMUNITY): Payer: Self-pay

## 2023-02-20 ENCOUNTER — Other Ambulatory Visit (HOSPITAL_COMMUNITY): Payer: Self-pay

## 2023-02-20 ENCOUNTER — Encounter: Payer: Self-pay | Admitting: Obstetrics and Gynecology

## 2023-02-20 VITALS — BP 112/72 | HR 78 | Ht 64.0 in | Wt 169.0 lb

## 2023-02-20 DIAGNOSIS — N952 Postmenopausal atrophic vaginitis: Secondary | ICD-10-CM | POA: Diagnosis not present

## 2023-02-20 DIAGNOSIS — Z5181 Encounter for therapeutic drug level monitoring: Secondary | ICD-10-CM

## 2023-02-20 DIAGNOSIS — Z01419 Encounter for gynecological examination (general) (routine) without abnormal findings: Secondary | ICD-10-CM

## 2023-02-20 DIAGNOSIS — Z79899 Other long term (current) drug therapy: Secondary | ICD-10-CM

## 2023-02-20 MED ORDER — ESTRADIOL 0.05 MG/24HR TD PTTW
1.0000 | MEDICATED_PATCH | TRANSDERMAL | 3 refills | Status: DC
  Filled 2023-02-20 – 2023-04-23 (×2): qty 24, 84d supply, fill #0
  Filled 2023-11-03: qty 24, 84d supply, fill #1

## 2023-02-20 MED ORDER — ESTRADIOL 10 MCG VA TABS
1.0000 | ORAL_TABLET | VAGINAL | 3 refills | Status: DC
  Filled 2023-02-20: qty 32, 84d supply, fill #0

## 2023-02-20 NOTE — Patient Instructions (Signed)

## 2023-02-21 ENCOUNTER — Other Ambulatory Visit: Payer: Self-pay

## 2023-02-21 ENCOUNTER — Other Ambulatory Visit (HOSPITAL_COMMUNITY): Payer: Self-pay

## 2023-03-05 ENCOUNTER — Other Ambulatory Visit (HOSPITAL_COMMUNITY): Payer: Self-pay

## 2023-04-08 ENCOUNTER — Other Ambulatory Visit (HOSPITAL_COMMUNITY): Payer: Self-pay

## 2023-04-12 ENCOUNTER — Encounter: Payer: Self-pay | Admitting: Family Medicine

## 2023-04-12 ENCOUNTER — Other Ambulatory Visit (HOSPITAL_COMMUNITY): Payer: Self-pay

## 2023-04-12 ENCOUNTER — Ambulatory Visit (INDEPENDENT_AMBULATORY_CARE_PROVIDER_SITE_OTHER): Payer: Commercial Managed Care - PPO | Admitting: Family Medicine

## 2023-04-12 VITALS — BP 120/79 | HR 66 | Ht 64.0 in | Wt 169.8 lb

## 2023-04-12 DIAGNOSIS — E785 Hyperlipidemia, unspecified: Secondary | ICD-10-CM

## 2023-04-12 DIAGNOSIS — I10 Essential (primary) hypertension: Secondary | ICD-10-CM | POA: Diagnosis not present

## 2023-04-12 DIAGNOSIS — Z6829 Body mass index (BMI) 29.0-29.9, adult: Secondary | ICD-10-CM

## 2023-04-12 DIAGNOSIS — Z7985 Long-term (current) use of injectable non-insulin antidiabetic drugs: Secondary | ICD-10-CM

## 2023-04-12 DIAGNOSIS — E1169 Type 2 diabetes mellitus with other specified complication: Secondary | ICD-10-CM

## 2023-04-12 HISTORY — DX: Body mass index (BMI) 29.0-29.9, adult: Z68.29

## 2023-04-12 LAB — POCT GLYCOSYLATED HEMOGLOBIN (HGB A1C): HbA1c, POC (controlled diabetic range): 5.8 % (ref 0.0–7.0)

## 2023-04-12 MED ORDER — MOUNJARO 7.5 MG/0.5ML ~~LOC~~ SOAJ
7.5000 mg | SUBCUTANEOUS | 1 refills | Status: DC
  Filled 2023-04-12 – 2023-05-04 (×5): qty 2, 28d supply, fill #0
  Filled 2023-06-10: qty 2, 28d supply, fill #1

## 2023-04-12 NOTE — Assessment & Plan Note (Signed)
A1C remains at goal Continue Mounjaro F/U in 4 months for reassessment

## 2023-04-12 NOTE — Progress Notes (Signed)
    SUBJECTIVE:   CHIEF COMPLAINT / HPI:   DM2/HTN/HLD: She is compliant with Mounjaro 5 mg weekly. She denies any GI symptoms of this medication. She is worried her A1C will be up since she ate poorly during the Dec holidays. She is compliant with Losartan 25 mg QD for HTN and Zetia 10 mg plus  Crestor 40 mg QD for her lipids. No other concerns.   Weight management: She feels she has not met her weight loss goal. Her goal is 160 lbs.  She wants her Mounjaro dose increased.   PERTINENT  PMH / PSH: PMHx reviewed  OBJECTIVE:   BP 120/79   Pulse 66   Ht 5\' 4"  (1.626 m)   Wt 169 lb 12.8 oz (77 kg) Comment: Declined  LMP  (LMP Unknown)   SpO2 100%   BMI 29.15 kg/m   Physical Exam Vitals and nursing note reviewed.  Cardiovascular:     Rate and Rhythm: Normal rate and regular rhythm.     Heart sounds: Normal heart sounds. No murmur heard. Pulmonary:     Effort: Pulmonary effort is normal. No respiratory distress.     Breath sounds: Normal breath sounds. No wheezing.  Abdominal:     General: Abdomen is flat. Bowel sounds are normal. There is no distension.     Palpations: Abdomen is soft. There is no mass.     Tenderness: There is no abdominal tenderness.     Hernia: No hernia is present.  Musculoskeletal:     Comments: Sensory exam of the foot is normal, tested with the monofilament. Good pulses, no lesions or ulcers, good peripheral pulses.       ASSESSMENT/PLAN:   DM2 (diabetes mellitus, type 2) (HCC) A1C remains at goal Continue Mounjaro F/U in 4 months for reassessment  Hyperlipidemia Stable on Crestor  Essential hypertension BP controlled on current regimen No dose adjustment needed  BMI 29.0-29.9,adult Weight loss goal - 9 more pounds to meet weight goal of 160 lb We decided to go up on her Mounjaro from 5 mg weekly to 7.5 mg weekly Med escribed F/U soon if there is any intolerance She agreed with the plan    Declined COVID-19 shot Janit Pagan,  MD [facility]

## 2023-04-12 NOTE — Assessment & Plan Note (Signed)
BP controlled on current regimen No dose adjustment needed

## 2023-04-12 NOTE — Assessment & Plan Note (Signed)
Stable on Crestor.

## 2023-04-12 NOTE — Patient Instructions (Signed)

## 2023-04-12 NOTE — Assessment & Plan Note (Signed)
Weight loss goal - 9 more pounds to meet weight goal of 160 lb We decided to go up on her Mounjaro from 5 mg weekly to 7.5 mg weekly Med escribed F/U soon if there is any intolerance She agreed with the plan

## 2023-04-23 ENCOUNTER — Other Ambulatory Visit (HOSPITAL_COMMUNITY): Payer: Self-pay

## 2023-04-26 ENCOUNTER — Other Ambulatory Visit (HOSPITAL_COMMUNITY): Payer: Self-pay

## 2023-04-26 ENCOUNTER — Ambulatory Visit (INDEPENDENT_AMBULATORY_CARE_PROVIDER_SITE_OTHER): Payer: Commercial Managed Care - PPO | Admitting: Student

## 2023-04-26 ENCOUNTER — Encounter: Payer: Self-pay | Admitting: Family Medicine

## 2023-04-26 ENCOUNTER — Telehealth: Payer: Self-pay | Admitting: *Deleted

## 2023-04-26 ENCOUNTER — Other Ambulatory Visit: Payer: Self-pay | Admitting: Family Medicine

## 2023-04-26 DIAGNOSIS — R519 Headache, unspecified: Secondary | ICD-10-CM

## 2023-04-26 MED ORDER — PROMETHAZINE HCL 12.5 MG PO TABS
12.5000 mg | ORAL_TABLET | Freq: Two times a day (BID) | ORAL | 0 refills | Status: DC | PRN
Start: 2023-04-26 — End: 2023-04-26
  Filled 2023-04-26: qty 20, 10d supply, fill #0

## 2023-04-26 NOTE — Telephone Encounter (Signed)
 Pt calls back to cancel Monday appt because she made an appt with her ortho doctor. She would still like Dr. Grandville Lax to call her. Macario Savin, CMA

## 2023-04-26 NOTE — Progress Notes (Signed)
 [facility] Telemedicine Visit  Patient consented to have virtual visit and was identified by name and date of birth. Method of visit: Telephone  Encounter participants: Patient: [name] - located at her home in [place] Provider: JINNY Con Gasman - located at [facility]  Reviewed the limitations of telemedicine especially given the nature of her neurologic complaint today.  She voices understanding.  Chief Complaint: Headache  HPI: Patient was seen by Dr. Anders about 2 weeks ago and reported that her migraine control was improved at that time.  Unfortunately, over the interval 2 weeks her headaches have been getting progressively worse.  She also feels that they are a little bit different in character compared to her baseline migraines.  The current headaches that she is having are left-sided and feel like they arise from her neck.  The pain is acutely worse when she turns her head to the side.  She is able to move her head up and down okay. She does have a history of spinal fusion surgery done by Dr. Beuford, she has follow-up scheduled with him on Monday morning. She has Zofran  at home that she can use for nausea but has not been taking this.  She has been taking Tylenol  and ibuprofen  with modest improvement. She denies any fever, chills, vision changes, numbness, or tingling.  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  Exam:  LMP  (LMP Unknown)   Respiratory: Speaking in full sentences over the phone, no evidence of respiratory distress.  Assessment/Plan: Assessment & Plan Acute nonintractable headache, unspecified headache type Patient having worsening headache over the past 2 weeks.  Obviously we are limited by the nature of today's telephone visit but it sounds like this may be related to her neck given that the pain is worse when she turns  her head to the side.  She has follow-up with her spine surgeon on Monday and I will be eager to hear what he has to say. -I have scheduled her to follow-up with me on Monday after her spine surgeon visit -She has Zofran  at home that she can take for nausea -Continue ibuprofen  and Tylenol  for pain -ER precautions reviewed for vision changes, weakness, acute worsening of the headache, or new fever. She voices understanding -Note that she has a history of an ependymoma on MRI imaging in 2018 but then subsequently had a normal MRI of the brain in December 2024.  Do not see an indication for repeat brain imaging at this time but should she develop new or focal findings, it would certainly be reasonable to repeat neuroimaging.    Time spent during visit with patient: 13 minutes J Con Gasman, MD

## 2023-04-26 NOTE — Telephone Encounter (Signed)
 Pt is calling because her head is still hurting pretty bad and I'm concerned because im nauseous  She wanted to be seen today, but we have no availability.  I have her scheduled for Monday but she would like Dr. Anders to call her and let her know what she thinks she should do.  SABRA Harlene Carte, CMA

## 2023-04-29 ENCOUNTER — Ambulatory Visit: Payer: Commercial Managed Care - PPO

## 2023-04-29 ENCOUNTER — Ambulatory Visit: Payer: Self-pay | Admitting: Student

## 2023-04-29 ENCOUNTER — Other Ambulatory Visit (HOSPITAL_COMMUNITY): Payer: Self-pay

## 2023-04-29 VITALS — BP 136/77 | HR 97 | Ht 65.0 in | Wt 174.6 lb

## 2023-04-29 DIAGNOSIS — M5010 Cervical disc disorder with radiculopathy, unspecified cervical region: Secondary | ICD-10-CM | POA: Diagnosis not present

## 2023-04-29 DIAGNOSIS — M542 Cervicalgia: Secondary | ICD-10-CM | POA: Diagnosis not present

## 2023-04-29 DIAGNOSIS — G43809 Other migraine, not intractable, without status migrainosus: Secondary | ICD-10-CM | POA: Diagnosis not present

## 2023-04-29 MED ORDER — METHYLPREDNISOLONE 4 MG PO TBPK
ORAL_TABLET | ORAL | 0 refills | Status: AC
  Filled 2023-04-29: qty 21, 6d supply, fill #0

## 2023-04-29 MED ORDER — MELOXICAM 15 MG PO TABS
15.0000 mg | ORAL_TABLET | Freq: Every day | ORAL | 0 refills | Status: DC
  Filled 2023-04-29: qty 60, 60d supply, fill #0

## 2023-04-29 MED ORDER — METHOCARBAMOL 500 MG PO TABS
500.0000 mg | ORAL_TABLET | Freq: Four times a day (QID) | ORAL | 2 refills | Status: AC | PRN
  Filled 2023-04-29: qty 60, 8d supply, fill #0
  Filled 2023-09-09: qty 60, 8d supply, fill #1

## 2023-04-29 NOTE — Patient Instructions (Addendum)
 Ms. Kubit,  It is lovely to meet you!  Based on my exam today, I think that your spine surgeon is on the right track with their treatment.  You can take a double dose of the methocarbamol  if you do not respond to the initial 500 mg dose.  Do know that this can make you drowsy so I recommend taking your first dose at night. Do not take ibuprofen  or Aleve  while you are taking the meloxicam . You can follow-up with Dr. Grandville Lax in 4 to 6 weeks to make sure that you are moving in the right direction.  Of course, if you start to develop vision changes, weakness, balance issues, please go to the emergency department.  Alexa Andrews, MD

## 2023-04-30 NOTE — Addendum Note (Signed)
Addended by: Manson Passey, Emari Demmer on: 04/30/2023 05:22 AM   Modules accepted: Level of Service

## 2023-05-01 NOTE — Progress Notes (Signed)
    SUBJECTIVE:   CHIEF COMPLAINT / HPI:   Headache  Neck Pain Here to follow-up after a phone visit that we had last week.  She has chronic migraines at baseline but has also developed a new, different pain along the left side of her head and into her left neck that has been present for the past 2 and half weeks or so.  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 Yevette Edwards, spine surgery.  She does have a history of spinal fusion. Helpfully, she actually saw Dr. Yevette Edwards earlier today and had x-rays which (by her report) showed progression of her degenerative changes.  She was prescribed a Medrol Dosepak, meloxicam, and Robaxin.  She has not taken any of these yet.  She tells me that it feels like the pain starts in the left side of her neck and then radiates up along the left side of her head and over her ear into her scalp.  The pain is worse when she looks to the left.  She does not have any scalp tenderness.  She denies any vision changes, although she does have some photophobia.  She denies any numbness, tingling, weakness.  No balance issues, tinnitus.  She has not had any fevers. Of note, she does have a history of a ependymoma on an MRI in 2018 that was not visualized on repeat MRI of the brain in November 2024.  OBJECTIVE:   BP 136/77   Pulse 97   Ht 5\' 5"  (1.651 m)   Wt 174 lb 9.6 oz (79.2 kg)   LMP  (LMP Unknown)   SpO2 100%   BMI 29.05 kg/m   Gen: Well appearing and in good spirits, NAD HENT: Eyes without papilledema, EOMs intact, visual fields intact  Neck: Significant tenderness along the entire Left side of the neck without obvious focality or trigger points. She has FROM about the neck, though endorses pain with left lateral rotation.  Neuro: CN II-XII intact, balance is intact, negative pronator drift. Gait is normal and speech is fluent. Strength and sensation are intact throughout.    ASSESSMENT/PLAN:     Assessment & Plan Cervical disc disorder with radiculopathy of cervical region I believe that her new symptoms likely stem from her neck pathology.  Very helpfully, she is already seen her spine surgeon today.  I agree with the recommendations for Medrol Dosepak, meloxicam, and methocarbamol.  We reviewed proper dosing for each of these.  She has a reassuring neuroexam in clinic today, I do not believe that we need to repeat any neuroimaging at this time. -Medrol Dosepak, meloxicam, methocarbamol per spine surgery -Follow up with spine surgery -ER precautions reviewed for new neurologic symptoms including weakness, vision changes, changes in balance/disequilibrium    J Dorothyann Gibbs, MD [facility]

## 2023-05-01 NOTE — Assessment & Plan Note (Signed)
I believe that her new symptoms likely stem from her neck pathology.  Very helpfully, she is already seen her spine surgeon today.  I agree with the recommendations for Medrol Dosepak, meloxicam, and methocarbamol.  We reviewed proper dosing for each of these.  She has a reassuring neuroexam in clinic today, I do not believe that we need to repeat any neuroimaging at this time. -Medrol Dosepak, meloxicam, methocarbamol per spine surgery -Follow up with spine surgery -ER precautions reviewed for new neurologic symptoms including weakness, vision changes, changes in balance/disequilibrium

## 2023-05-03 ENCOUNTER — Other Ambulatory Visit (HOSPITAL_COMMUNITY): Payer: Self-pay

## 2023-05-03 DIAGNOSIS — M5481 Occipital neuralgia: Secondary | ICD-10-CM | POA: Diagnosis not present

## 2023-05-03 DIAGNOSIS — S161XXD Strain of muscle, fascia and tendon at neck level, subsequent encounter: Secondary | ICD-10-CM | POA: Diagnosis not present

## 2023-05-06 ENCOUNTER — Other Ambulatory Visit (HOSPITAL_COMMUNITY): Payer: Self-pay

## 2023-05-07 ENCOUNTER — Other Ambulatory Visit (HOSPITAL_COMMUNITY): Payer: Self-pay

## 2023-05-14 ENCOUNTER — Encounter: Payer: Self-pay | Admitting: Family Medicine

## 2023-05-14 ENCOUNTER — Ambulatory Visit: Payer: Commercial Managed Care - PPO | Admitting: Family Medicine

## 2023-05-14 VITALS — BP 134/75 | HR 73 | Ht 65.0 in | Wt 175.8 lb

## 2023-05-14 DIAGNOSIS — Z85841 Personal history of malignant neoplasm of brain: Secondary | ICD-10-CM | POA: Diagnosis not present

## 2023-05-14 DIAGNOSIS — M5412 Radiculopathy, cervical region: Secondary | ICD-10-CM

## 2023-05-14 DIAGNOSIS — H9201 Otalgia, right ear: Secondary | ICD-10-CM

## 2023-05-14 DIAGNOSIS — R269 Unspecified abnormalities of gait and mobility: Secondary | ICD-10-CM | POA: Diagnosis not present

## 2023-05-14 NOTE — Assessment & Plan Note (Signed)
 MRI reviewed and and spoke with Dr. Lorenza Cambridge  who read the report again with me today He confirmed that there were no Ependymoma on the MRI read Her MRI brain from was also negative for Subependymoma I will take this off her problem list

## 2023-05-14 NOTE — Progress Notes (Addendum)
 SUBJECTIVE:   CHIEF COMPLAINT / HPI:   Headache  This is a recurrent problem. The current episode started more than 1 month ago. The problem occurs constantly. The problem has been unchanged. The pain is located in the Occipital region. The pain does not radiate. The pain quality is not similar to prior headaches. The quality of the pain is described as sharp and aching. The pain is at a severity of 7/10. Associated symptoms include ear pain and hearing loss. Pertinent negatives include no blurred vision, coughing, drainage, eye pain, eye redness, eye watering, fever, neck pain, numbness, phonophobia, photophobia, sinus pressure, tingling or vomiting. Associated symptoms comments: Nausea subsided. She has right ear pain x 1 week and feels off balance sometimes Hearing seems reduced compared to before. Treatments tried: Headache improved s/p Medrol Dosepak, meloxicam, and methocarbamol. However, since she finished her meds, her headache has gradually crept back up. Improvement on treatment: She saw her spine specialist, who recommended a potential cervical spine injection to further help with her symptoms.  Otalgia  There is pain in the right ear. This is a recurrent problem. The current episode started in the past 7 days (She had a similar episode in Nov of 2024). The problem has been waxing and waning. There has been no fever. The pain is mild. Associated symptoms include headaches and hearing loss. Pertinent negatives include no coughing, drainage, neck pain or vomiting. Treatments tried: Was given A/B Nov 2024. The treatment provided mild relief. There is no history of a chronic ear infection. Felt her hearing has reduced   Balance off:  C/O feeling of imbalance here and there for the past 2-3 months. No recent fall. Symptoms persists but unchanged.  PERTINENT  PMH / PSH: PMHx reviewed  OBJECTIVE:   BP 134/75   Pulse 73   Ht 5\' 5"  (1.651 m)   Wt 175 lb 12.8 oz (79.7 kg)   LMP  (LMP  Unknown)   SpO2 99%   BMI 29.25 kg/m   Physical Exam Vitals and nursing note reviewed.  Constitutional:      General: She is not in acute distress.    Appearance: Normal appearance.  HENT:     Head: Normocephalic and atraumatic.     Comments: Hearing Screening  Right ear:    500Hz :  Pass   1000Hz :  Pass   2000Hz :  Pass   4000Hz :  Pass  Left ear:    500Hz :  Pass   1000Hz :  Pass   2000Hz :  Pass   4000Hz :  Pass      Right Ear: Tympanic membrane, ear canal and external ear normal. There is no impacted cerumen.     Left Ear: Tympanic membrane, ear canal and external ear normal. There is no impacted cerumen.  Eyes:     Conjunctiva/sclera: Conjunctivae normal.  Neck:     Comments: Mild left side occipital tenderness. Mild left side neck tenderness Cardiovascular:     Rate and Rhythm: Normal rate and regular rhythm.     Pulses: Normal pulses.     Heart sounds: Normal heart sounds. No murmur heard. Pulmonary:     Effort: Pulmonary effort is normal. No respiratory distress.     Breath sounds: Normal breath sounds. No wheezing.  Abdominal:     General: Abdomen is flat. Bowel sounds are normal. There is no distension.     Palpations: There is no mass.     Tenderness: There is no abdominal tenderness.  Musculoskeletal:  Cervical back: Normal range of motion and neck supple.  Neurological:     General: No focal deficit present.     Mental Status: She is alert.     Cranial Nerves: Cranial nerves 2-12 are intact.     Sensory: Sensation is intact.     Motor: Motor function is intact.     Coordination: Coordination is intact.     Gait: Gait is intact.     Deep Tendon Reflexes: Reflexes are normal and symmetric. Babinski sign absent on the right side. Babinski sign absent on the left side.      ASSESSMENT/PLAN:   Cervical neuralgia No neurologic deficit or signs of meningeal irritation on exam Her gait was balanced at the time of this visit MRI brain from 3 months ago  reviewed with no acute findings I doubt re-imaging is needed at this time However, I offered this to her today, but she declined She will follow up with her Spine specialist for further evaluation and treatment Continue Meloxicam & Robaxin prn as escribed.  Ophthalmology appointment scheduled for eye exam.  History of ependymoma of brain MRI reviewed and and spoke with Dr. Lorenza Cambridge  who read the report again with me today He confirmed that there were no Ependymoma on the MRI read Her MRI brain from was also negative for Subependymoma I will take this off her problem list  Otalgia of right ear Etiology unclear She completed oral A/B 3 months ago for a similar presentation ?? Referred pain Ear exam and hearing screening were normal Discussed possible referral to ENT if this persists She will reach out soon  Gait abnormality Normal neuro exam with normal gait today I offered neuro imaging and PT referral but she declined She'll f/u soon if symptoms persists Monitor for now     Janit Pagan, MD [facility]

## 2023-05-14 NOTE — Assessment & Plan Note (Addendum)
 No neurologic deficit or signs of meningeal irritation on exam Her gait was balanced at the time of this visit MRI brain from 3 months ago reviewed with no acute findings I doubt re-imaging is needed at this time However, I offered this to her today, but she declined She will follow up with her Spine specialist for further evaluation and treatment Continue Meloxicam & Robaxin prn as escribed.  Ophthalmology appointment scheduled for eye exam.

## 2023-05-14 NOTE — Assessment & Plan Note (Signed)
 Etiology unclear She completed oral A/B 3 months ago for a similar presentation ?? Referred pain Ear exam and hearing screening were normal Discussed possible referral to ENT if this persists She will reach out soon

## 2023-05-14 NOTE — Patient Instructions (Signed)
 Cervicogenic Headache  In a cervicogenic headache, the pain moves from your neck to your head. Most cervicogenic headaches start in the upper part of the neck with the first three cervical bones (cervical vertebrae). What are the causes? The most common cause of this condition is a traumatic injury to the bones and tissues in your neck (cervical spine). Whiplash is an example of a cervical spine injury. Other causes include: Arthritis. Broken bone (fracture). Infection. Tumor. What are the signs or symptoms? The most common symptoms are neck and head pain. The pain is often located on one side. In some cases, there may be head pain without neck pain. Pain may be felt in the neck, back or side of the head, face, or behind the eyes. Other symptoms include: Limited movement in the neck. Arm or shoulder pain. How is this diagnosed? This condition may be diagnosed based on: Your symptoms. A physical exam. An injection that blocks nerve signals (diagnostic nerve block). Imaging tests, such as: X-rays. CT scan. MRI. A cervicogenic headache is diagnosed when a cause can be found in the cervical spine and other causes of headaches can be ruled out. How is this treated? Treatment for this condition may depend on the underlying condition. Treatment may include: Medicines, such as: NSAIDs, such as ibuprofen. Muscle relaxants. Physical therapy. Massage therapy. Complementary therapies, such as: Biofeedback. Meditation. Acupuncture. Nerve block injections to reduce the pain. Botulinum toxin injections. Your treatment plan may involve working with a pain management team that includes your primary health care provider, a pain management specialist, a neurologist, and a physical therapist. Follow these instructions at home: Take over-the-counter and prescription medicines only as told by your health care provider. Do exercises at home as told by your physical therapist. Return to your normal  activities as told by your health care provider. Ask your health care provider what activities are safe for you. Avoid activities that trigger your headaches. Maintain good neck support and posture at home and at work. Keep all follow-up visits. This is important. Contact a health care provider if: You have headaches that are getting worse and happening more often. You have headaches with any of the following: Fever. Numbness. Weakness. Dizziness. Nausea or vomiting. Get help right away if: You have a sudden and severe headache. This symptom may be an emergency. Get help right away. Call 911. Do not wait to see if this symptom will go away. Do not drive yourself to the hospital. Summary A cervicogenic headache is a headache caused by a condition that affects the bones and tissues in your cervical spine. Your health care provider may diagnose this condition with a physical exam, a diagnostic nerve block, and imaging tests. Treatment may include medicine to reduce pain and inflammation, physical therapy, and nerve block injections. Complementary therapies, such as acupuncture and meditation, may be added to other treatments. Your treatment plan may involve working with a pain management team that includes your primary health care provider, a pain management specialist, a neurologist, and a physical therapist. This information is not intended to replace advice given to you by your health care provider. Make sure you discuss any questions you have with your health care provider. Document Revised: 09/08/2020 Document Reviewed: 09/08/2020 Elsevier Patient Education  2024 [employer].

## 2023-05-14 NOTE — Assessment & Plan Note (Signed)
 Normal neuro exam with normal gait today I offered neuro imaging and PT referral but she declined She'll f/u soon if symptoms persists Monitor for now

## 2023-06-04 ENCOUNTER — Telehealth: Payer: Self-pay | Admitting: Family Medicine

## 2023-06-04 ENCOUNTER — Encounter: Payer: Self-pay | Admitting: Family Medicine

## 2023-06-04 ENCOUNTER — Ambulatory Visit: Payer: Commercial Managed Care - PPO | Admitting: Family Medicine

## 2023-06-04 ENCOUNTER — Other Ambulatory Visit (HOSPITAL_COMMUNITY): Payer: Self-pay

## 2023-06-04 VITALS — BP 130/78 | HR 73 | Ht 65.0 in | Wt 173.2 lb

## 2023-06-04 DIAGNOSIS — R10A0 Flank pain, unspecified side: Secondary | ICD-10-CM

## 2023-06-04 DIAGNOSIS — G43809 Other migraine, not intractable, without status migrainosus: Secondary | ICD-10-CM

## 2023-06-04 DIAGNOSIS — R5383 Other fatigue: Secondary | ICD-10-CM | POA: Diagnosis not present

## 2023-06-04 DIAGNOSIS — K120 Recurrent oral aphthae: Secondary | ICD-10-CM | POA: Diagnosis not present

## 2023-06-04 DIAGNOSIS — R109 Unspecified abdominal pain: Secondary | ICD-10-CM | POA: Diagnosis not present

## 2023-06-04 LAB — POCT URINALYSIS DIP (MANUAL ENTRY)
Bilirubin, UA: NEGATIVE
Glucose, UA: NEGATIVE mg/dL
Ketones, POC UA: NEGATIVE mg/dL
Leukocytes, UA: NEGATIVE
Nitrite, UA: NEGATIVE
Protein Ur, POC: NEGATIVE mg/dL
Spec Grav, UA: 1.02 (ref 1.010–1.025)
Urobilinogen, UA: 0.2 U/dL
pH, UA: 6.5 (ref 5.0–8.0)

## 2023-06-04 LAB — POCT UA - MICROSCOPIC ONLY: WBC, Ur, HPF, POC: NONE SEEN (ref 0–5)

## 2023-06-04 MED ORDER — CHLORHEXIDINE GLUCONATE 0.12 % MT SOLN
15.0000 mL | Freq: Two times a day (BID) | OROMUCOSAL | 0 refills | Status: AC
  Filled 2023-06-04: qty 473, 16d supply, fill #0

## 2023-06-04 NOTE — Assessment & Plan Note (Signed)
 Improved Neurologically intact Monitor for now

## 2023-06-04 NOTE — Telephone Encounter (Signed)
 Result discussed. Trace blood in her urine discussed. ?? Kidney stone vs dehydration, vs orthostatic. Pain medicine prn and hydration recommended F/U soon if no improvement She agreed with the plan

## 2023-06-04 NOTE — Progress Notes (Signed)
    SUBJECTIVE:   CHIEF COMPLAINT / HPI:   Headache: Improved since the last visit with reduced frequency. Denies headache today.   Tongue: C/O ulcer on the tip of her tongue, which started about 1 week ago. No change in her diet. She drank coffee recently and thought that could be the cause. She has a recent change in her toothpaste. However, she says her husband does not have a similar presentation. She uses oral gel OTC for pain. Her tongue ulcer is painful when she eats, but she has no difficulty swallowing.   Side pain:  C/O pain on her left flank since yesterday. No trauma.  This started when she tried to get up from a sitting position at work. Her pain is about 4/10 in severity. No prior episode. She has no urinary or bowel concerns, although she feels that her urine output is reduced.   Fatigue: Endorsed difficulty keeping awake despite good night rest.   PERTINENT  PMH / PSH: PMHx reviewed  OBJECTIVE:   BP 130/78   Pulse 73   Ht 5\' 5"  (1.651 m)   Wt 173 lb 4 oz (78.6 kg)   LMP  (LMP Unknown)   SpO2 100%   BMI 28.83 kg/m   Physical Exam Vitals and nursing note reviewed.  HENT:     Head:     Comments: Shallow ulcer on the tip of her tongue Cardiovascular:     Rate and Rhythm: Normal rate and regular rhythm.     Heart sounds: Normal heart sounds. No murmur heard. Pulmonary:     Effort: Pulmonary effort is normal. No respiratory distress.     Breath sounds: Normal breath sounds. No wheezing or rhonchi.  Abdominal:     General: Abdomen is flat. Bowel sounds are normal. There is no distension.     Palpations: Abdomen is soft.     Tenderness: There is no abdominal tenderness. There is no right CVA tenderness.     Comments: Mild tenderness of her latissimus dorsi      ASSESSMENT/PLAN:   Migraine headache Improved Neurologically intact Monitor for now  Fatigue ?? Chronic fatigue syndrome ?? Medication-related (Gabapentin or Robaxin (only prn)) She was  previously worked up for this, and labs were good, including a normal sleep study. May benefit from graded exercise Monitor for now   Flank pain: ?? MSK, vs renal stone, vs pyeloneprhitis UA neg for nitrite/leukocytes Trace blood - ?? renal stone or due to dehydration or orthostatic Hydration encouraged Consider renal CT scan if pain persists I called her after the visit to discuss She agreed with the plan  Canker sore: benign Chlorhexidine mouthwash e-scribed Monitor for improvement  Janit Pagan, MD [facility]

## 2023-06-04 NOTE — Assessment & Plan Note (Signed)
??   Chronic fatigue syndrome ?? Medication-related (Gabapentin or Robaxin (only prn)) She was previously worked up for this, and labs were good, including a normal sleep study. May benefit from graded exercise Monitor for now

## 2023-06-10 ENCOUNTER — Other Ambulatory Visit (HOSPITAL_COMMUNITY): Payer: Self-pay

## 2023-06-10 ENCOUNTER — Other Ambulatory Visit: Payer: Self-pay

## 2023-06-10 ENCOUNTER — Other Ambulatory Visit: Payer: Self-pay | Admitting: Family Medicine

## 2023-06-10 MED ORDER — AZELASTINE-FLUTICASONE 137-50 MCG/ACT NA SUSP
1.0000 | Freq: Two times a day (BID) | NASAL | 1 refills | Status: AC | PRN
  Filled 2023-06-10: qty 23, 30d supply, fill #0

## 2023-07-08 ENCOUNTER — Encounter: Payer: Self-pay | Admitting: Family Medicine

## 2023-07-09 ENCOUNTER — Ambulatory Visit: Admitting: Family Medicine

## 2023-07-15 ENCOUNTER — Other Ambulatory Visit (HOSPITAL_COMMUNITY): Payer: Self-pay

## 2023-07-15 DIAGNOSIS — H16041 Marginal corneal ulcer, right eye: Secondary | ICD-10-CM | POA: Diagnosis not present

## 2023-07-15 MED ORDER — NEOMYCIN-POLYMYXIN-DEXAMETH 0.1 % OP SUSP
1.0000 [drp] | Freq: Three times a day (TID) | OPHTHALMIC | 0 refills | Status: DC
  Filled 2023-07-15: qty 5, 33d supply, fill #0

## 2023-07-15 MED ORDER — ERYTHROMYCIN 5 MG/GM OP OINT
1.0000 | TOPICAL_OINTMENT | Freq: Every evening | OPHTHALMIC | 1 refills | Status: DC
  Filled 2023-07-15: qty 3.5, 10d supply, fill #0

## 2023-07-17 DIAGNOSIS — H16041 Marginal corneal ulcer, right eye: Secondary | ICD-10-CM | POA: Diagnosis not present

## 2023-07-19 ENCOUNTER — Other Ambulatory Visit: Payer: Self-pay | Admitting: Family Medicine

## 2023-07-19 ENCOUNTER — Other Ambulatory Visit (HOSPITAL_COMMUNITY): Payer: Self-pay

## 2023-07-19 DIAGNOSIS — H524 Presbyopia: Secondary | ICD-10-CM | POA: Diagnosis not present

## 2023-07-19 MED ORDER — MOUNJARO 7.5 MG/0.5ML ~~LOC~~ SOAJ
7.5000 mg | SUBCUTANEOUS | 1 refills | Status: DC
  Filled 2023-07-19: qty 2, 28d supply, fill #0
  Filled 2023-08-15: qty 2, 28d supply, fill #1

## 2023-08-13 ENCOUNTER — Encounter: Payer: Self-pay | Admitting: Family Medicine

## 2023-08-13 ENCOUNTER — Other Ambulatory Visit (HOSPITAL_COMMUNITY): Payer: Self-pay

## 2023-08-13 ENCOUNTER — Other Ambulatory Visit: Payer: Self-pay | Admitting: Family Medicine

## 2023-08-13 MED ORDER — IBUPROFEN 600 MG PO TABS
600.0000 mg | ORAL_TABLET | Freq: Two times a day (BID) | ORAL | 1 refills | Status: DC | PRN
  Filled 2023-08-13: qty 60, 30d supply, fill #0
  Filled 2023-09-27 (×2): qty 60, 30d supply, fill #1

## 2023-08-15 ENCOUNTER — Other Ambulatory Visit: Payer: Self-pay | Admitting: Family Medicine

## 2023-08-15 ENCOUNTER — Other Ambulatory Visit (HOSPITAL_COMMUNITY): Payer: Self-pay

## 2023-08-15 ENCOUNTER — Other Ambulatory Visit: Payer: Self-pay

## 2023-08-15 MED ORDER — EZETIMIBE 10 MG PO TABS
10.0000 mg | ORAL_TABLET | Freq: Every day | ORAL | 1 refills | Status: AC
  Filled 2023-08-15: qty 90, 90d supply, fill #0
  Filled 2024-03-31: qty 90, 90d supply, fill #1
  Filled ????-??-??: fill #1

## 2023-08-22 ENCOUNTER — Encounter: Payer: Self-pay | Admitting: Family Medicine

## 2023-08-23 ENCOUNTER — Ambulatory Visit: Admitting: Family Medicine

## 2023-08-23 ENCOUNTER — Ambulatory Visit: Payer: Self-pay | Admitting: Family Medicine

## 2023-08-23 ENCOUNTER — Encounter: Payer: Self-pay | Admitting: Family Medicine

## 2023-08-23 ENCOUNTER — Other Ambulatory Visit (HOSPITAL_COMMUNITY): Payer: Self-pay

## 2023-08-23 VITALS — BP 119/82 | HR 73 | Ht 65.0 in | Wt 165.8 lb

## 2023-08-23 DIAGNOSIS — M5416 Radiculopathy, lumbar region: Secondary | ICD-10-CM

## 2023-08-23 DIAGNOSIS — R319 Hematuria, unspecified: Secondary | ICD-10-CM

## 2023-08-23 DIAGNOSIS — M79673 Pain in unspecified foot: Secondary | ICD-10-CM

## 2023-08-23 DIAGNOSIS — E1169 Type 2 diabetes mellitus with other specified complication: Secondary | ICD-10-CM | POA: Diagnosis not present

## 2023-08-23 LAB — POCT GLYCOSYLATED HEMOGLOBIN (HGB A1C): HbA1c, POC (controlled diabetic range): 5.7 % (ref 0.0–7.0)

## 2023-08-23 LAB — POCT URINALYSIS DIP (MANUAL ENTRY)
Bilirubin, UA: NEGATIVE
Blood, UA: NEGATIVE
Glucose, UA: NEGATIVE mg/dL
Ketones, POC UA: NEGATIVE mg/dL
Leukocytes, UA: NEGATIVE
Nitrite, UA: NEGATIVE
Protein Ur, POC: NEGATIVE mg/dL
Spec Grav, UA: 1.01 (ref 1.010–1.025)
Urobilinogen, UA: 0.2 U/dL
pH, UA: 6 (ref 5.0–8.0)

## 2023-08-23 MED ORDER — GABAPENTIN 100 MG PO CAPS
100.0000 mg | ORAL_CAPSULE | Freq: Three times a day (TID) | ORAL | 1 refills | Status: DC
  Filled 2023-08-23: qty 90, 30d supply, fill #0
  Filled 2024-02-26: qty 90, 30d supply, fill #1

## 2023-08-23 MED ORDER — PANTOPRAZOLE SODIUM 40 MG PO TBEC
40.0000 mg | DELAYED_RELEASE_TABLET | Freq: Every day | ORAL | 0 refills | Status: AC
  Filled 2023-08-23: qty 90, 90d supply, fill #0

## 2023-08-23 MED ORDER — TRAMADOL HCL 50 MG PO TABS
50.0000 mg | ORAL_TABLET | Freq: Two times a day (BID) | ORAL | 0 refills | Status: AC | PRN
  Filled 2023-08-23: qty 12, 6d supply, fill #0

## 2023-08-23 NOTE — Assessment & Plan Note (Addendum)
 A1C looks good today Continue Mounjaro  7.5 mg weekly Monitor home CBG closely Urine microalbumin checked She signed ROI for DM retina exam from ophthalmology

## 2023-08-23 NOTE — Patient Instructions (Signed)
 Plantar Fasciitis: What to Know  Your plantar fascia is a band of thick tissue on the bottom of your foot. It connects your heel bone to the base of your toes. If the fascia gets irritated, it can cause pain in your heel or foot. This is called plantar fasciitis. In some cases, plantar fasciitis can make it hard for you to walk or move. The pain is often worse in the morning after sleeping, or after sitting or lying down for a long time. Pain may also be worse after walking or standing for a long time. What are the causes? Plantar fasciitis may be caused by: Standing for a long time. Wearing shoes that don't have good arch support. Doing high-impact activities. These are things that put stress on your joints. They include: Ballet. Aerobic exercises. These are exercises that make your heart beat faster. Being overweight. Having a way of walking, or gait, that isn't normal. Tight muscles in your calf, which is in the back of your lower leg. High arches in your feet, or flat feet. Starting a new sport or activity. What are the signs or symptoms? The main symptom of plantar fasciitis is heel pain. Your pain may get worse after: You take your first steps after a time of rest. This includes in the morning after you wake up, or after you've been sitting or lying down for a while. Standing still for a long time. Pain may lessen after 30-45 minutes of activity, such as gentle walking. How is this diagnosed? Plantar fasciitis may be diagnosed based on your medical history, your symptoms, and an exam. Your health care provider will check for: A tender spot on the bottom of your foot. A high arch in your foot, or flat feet. Pain when you move your foot. Trouble moving your foot. You may also have tests. These may include: X-rays. Ultrasound. MRI. How is this treated? Treatment depends on how bad your plantar fasciitis is. It may include: RICE therapy. This stands for rest, ice, pressure  (compression), and raising (elevating) the foot. Exercises to stretch your calves and plantar fascia. A night splint. This holds your foot in a stretched, upward position while you sleep. Physical therapy. This can help with symptoms. It can also prevent problems in the future. Shots of a steroid medicine called cortisone. This can help with pain and irritation. Extracorporeal shock wave therapy. This uses electric shocks to stimulate your plantar fascia. If other treatments don't help, you may need to have surgery. Follow these instructions at home: Managing pain, stiffness, and swelling  Use ice, an ice pack, or a frozen bottle of water as told. Place a towel between your skin and the ice. Roll the bottom of your foot over the ice or frozen bottle. Do this for 20 minutes, 2-3 times a day. If your skin turns red, take off the ice right away to prevent skin damage. The risk of damage is higher if you can't feel pain, heat, or cold. Wear shoes that have air-sole or gel-sole cushions. You could also try soft shoe inserts made for plantar fasciitis. Activity Try not to do things that cause pain. Ask what things are safe for you to do. Exercise as told. Try activities that are low impact. This means that they're easier on your joints. They include: Swimming. Water aerobics. Biking. General instructions Take your medicines only as told. Wear a night splint as told. Loosen the splint if your toes tingle, are numb, or turn cold and blue.  Stay at a healthy weight. Work with your provider to lose weight as needed. Contact a health care provider if: Your symptoms don't go away with treatment. You have pain that gets worse. Your pain makes it hard to move or do everyday things. This information is not intended to replace advice given to you by your health care provider. Make sure you discuss any questions you have with your health care provider. Document Revised: 08/06/2022 Document Reviewed:  08/06/2022 Elsevier Patient Education  2024 [employer].

## 2023-08-23 NOTE — Assessment & Plan Note (Addendum)
 Lumbar spine xray done a few months ago - Lower lumbar facet arthropathy as above  Continue Ibuprofen  prn Resume Gabapentin  - consider dose increase in the future A few days of Tramadol  escribed only for breakthrough pain Consider referral to orthopedic and PT in the future She agreed with the plan

## 2023-08-23 NOTE — Assessment & Plan Note (Addendum)
 Left foot pain Likely plantar fasciitis Home exercise instruction provided Continue Ibuprofen  as needed Consider xray, PT and foot specialist referral in the future She agreed with the plan

## 2023-08-23 NOTE — Progress Notes (Signed)
    SUBJECTIVE:   CHIEF COMPLAINT / HPI:   DM2: On Mounjaro  7.5 mg weekly. Here for f/u  Eye exam: She was seen by ophthalmology in April for a bacterial infection of her right eye. She was treated with A/B and feels better now. She follows closely with ophthal for this.  Foot pain: C/O Left foot pain x 1.5 months. Pain waxes and wanes and is worse with ambulation. She described the pain as sharp. She denies any pain today. She wears a footband to help alleviate her pain.   Back pain: C/O low back pain. This has been ongoing for months and seems to be worsening. Pain is worse when she is at work--but improves with rest. Ibuprofen  is not helping much. Tramadol  helped in the past.  Flank pain: Resolved - had trace blood in the urine during her last visit. She is here for follow up.  PERTINENT  PMH / PSH: PMHx reviewed  OBJECTIVE:   BP 119/82   Pulse 73   Ht 5\' 5"  (1.651 m)   Wt 165 lb 12.8 oz (75.2 kg)   LMP  (LMP Unknown)   SpO2 99%   BMI 27.59 kg/m   Physical Exam Vitals and nursing note reviewed.  Eyes:     Conjunctiva/sclera: Conjunctivae normal.  Cardiovascular:     Rate and Rhythm: Normal rate and regular rhythm.     Pulses: Normal pulses.     Heart sounds: Normal heart sounds. No murmur heard. Pulmonary:     Effort: Pulmonary effort is normal. No respiratory distress.     Breath sounds: Normal breath sounds. No wheezing.  Musculoskeletal:     Lumbar back: Normal range of motion.     Right lower leg: No edema.     Left lower leg: No edema.     Right foot: Normal. Normal range of motion. No swelling or tenderness.     Left foot: Normal. Normal range of motion. No swelling or tenderness.     Comments: Normal gait      ASSESSMENT/PLAN:   Assessment & Plan Type 2 diabetes mellitus with other specified complication, without long-term current use of insulin  (HCC) A1C looks good today Continue Mounjaro  7.5 mg weekly Monitor home CBG closely Urine microalbumin  checked She signed ROI for DM retina exam from ophthalmology Hematuria, unspecified type Normal UA today No further action is required Pain of foot, unspecified laterality Left foot pain Likely plantar fasciitis Home exercise instruction provided Continue Ibuprofen  as needed Consider xray, PT and foot specialist referral in the future She agreed with the plan Lumbar radiculopathy Lumbar spine xray done a few months ago - Lower lumbar facet arthropathy as above  Continue Ibuprofen  prn Resume Gabapentin  - consider dose increase in the future A few days of Tramadol  escribed only for breakthrough pain Consider referral to orthopedic and PT in the future She agreed with the plan   Bacterial eye infection - resolved - f/u with ophthalmology as planned.   Penni Bowman, MD [facility]

## 2023-08-24 LAB — LIPID PANEL
Chol/HDL Ratio: 2.9 ratio (ref 0.0–4.4)
Cholesterol, Total: 133 mg/dL (ref 100–199)
HDL: 46 mg/dL (ref >39)
LDL Chol Calc (NIH): 70 mg/dL (ref 0–99)
Triglycerides: 86 mg/dL (ref 0–149)
VLDL Cholesterol Cal: 17 mg/dL (ref 5–40)

## 2023-08-24 LAB — BASIC METABOLIC PANEL WITH GFR
BUN/Creatinine Ratio: 11 — ABNORMAL LOW (ref 12–28)
BUN: 10 mg/dL (ref 8–27)
CO2: 24 mmol/L (ref 20–29)
Calcium: 9.6 mg/dL (ref 8.7–10.3)
Chloride: 104 mmol/L (ref 96–106)
Creatinine, Ser: 0.89 mg/dL (ref 0.57–1.00)
Glucose: 50 mg/dL — ABNORMAL LOW (ref 70–99)
Potassium: 4 mmol/L (ref 3.5–5.2)
Sodium: 144 mmol/L (ref 134–144)
eGFR: 74 mL/min/{1.73_m2} (ref >59)

## 2023-08-24 LAB — MICROALBUMIN / CREATININE URINE RATIO
Creatinine, Urine: 94.8 mg/dL
Microalb/Creat Ratio: 4 mg/g{creat} (ref 0–29)
Microalbumin, Urine: 3.7 ug/mL

## 2023-09-27 ENCOUNTER — Other Ambulatory Visit (HOSPITAL_COMMUNITY): Payer: Self-pay

## 2023-10-09 ENCOUNTER — Other Ambulatory Visit: Payer: Self-pay | Admitting: Family Medicine

## 2023-10-09 ENCOUNTER — Other Ambulatory Visit (HOSPITAL_COMMUNITY): Payer: Self-pay

## 2023-10-09 MED ORDER — MOUNJARO 7.5 MG/0.5ML ~~LOC~~ SOAJ
7.5000 mg | SUBCUTANEOUS | 1 refills | Status: DC
  Filled 2023-10-09: qty 2, 28d supply, fill #0
  Filled 2023-11-03: qty 2, 28d supply, fill #1

## 2023-11-22 ENCOUNTER — Ambulatory Visit: Payer: Self-pay | Admitting: Family Medicine

## 2023-11-22 ENCOUNTER — Ambulatory Visit: Admitting: Family Medicine

## 2023-11-22 ENCOUNTER — Encounter: Payer: Self-pay | Admitting: Family Medicine

## 2023-11-22 VITALS — BP 119/73 | HR 80 | Ht 65.0 in | Wt 162.8 lb

## 2023-11-22 DIAGNOSIS — M545 Low back pain, unspecified: Secondary | ICD-10-CM

## 2023-11-22 DIAGNOSIS — I10 Essential (primary) hypertension: Secondary | ICD-10-CM | POA: Diagnosis not present

## 2023-11-22 DIAGNOSIS — E1169 Type 2 diabetes mellitus with other specified complication: Secondary | ICD-10-CM

## 2023-11-22 DIAGNOSIS — Z23 Encounter for immunization: Secondary | ICD-10-CM

## 2023-11-22 LAB — POCT GLYCOSYLATED HEMOGLOBIN (HGB A1C): HbA1c, POC (controlled diabetic range): 5.6 % (ref 0.0–7.0)

## 2023-11-22 NOTE — Patient Instructions (Addendum)
 It was great to see you!  Our plans for today:  - No changes to your medications.  - We gave you your flu shot today.  - We are checking your A1c and will release these results to your MyChart. - Try ibuprofen  or diclofenac  gel for your back. Try the stretches below.   Take care and seek immediate care sooner if you develop any concerns.   Dr. Madelon Bene on your back. Bend your right knee so that your right foot is flat on the floor. Cross your left leg over your right so that your left ankle rests on your right knee. Use your hands to grab hold of your left knee and pull it gently toward the opposite shoulder. You should feel the stretch in your buttocks and hips. Hold for 15 to 30 seconds. Relax, and then repeat with the other leg. Repeat this cycle 2 to 4 times.   Lie on your back in a doorway, with one leg through the open door. Slide your leg up the wall to straighten your knee. You should feel a gentle stretch down the back of your leg. Hold the stretch for at least 1 minute. As the days go by, add a little more time until you can relax and let these muscles stretch for as much as 6 minutes for each leg. Do not arch your back. Do not bend either knee. Keep one heel touching the floor and the other heel touching the wall. Do not point your toes. Repeat with your other leg. Do 2 to 4 times for each leg. If you do not have a place to do this exercise in a doorway, there is another way to do it:  Lie on your back and bend the knee of the leg you want to stretch. Loop a towel under the ball and toes of that foot, and hold the ends of the towel in your hands. Straighten your knee and slowly pull back on the towel. You should feel a gentle stretch down the back of your leg. It is hard to hold this stretch with a towel for a long time, but hold the stretch for at least 15 to 30 seconds. One minute or more is even better. Repeat with your other leg. Do 2 to 4 times for each  leg.   Child's Pose Kneel on the floor with your toes together and your knees hip-width apart. Rest your palms on top of your thighs. On an exhale, lower your torso between your knees. Extend your arms alongside your torso with your palms facing down. Relax your shoulders toward the ground. Rest in the pose for as long as needed.

## 2023-11-22 NOTE — Progress Notes (Signed)
    SUBJECTIVE:   CHIEF COMPLAINT / HPI:   Discussed the use of AI scribe software for clinical note transcription with the patient, who gave verbal consent to proceed.  History of Present Illness Sarann Tregre is a 63 year old female with hypertension and diabetes who presents for follow-up on blood pressure and diabetes management.  Back pain - Intermittent pain on L mid back for approximately one month - Thinks pain associated with specific activities or positions, feels occasionally with sitting in certain position on the couch. - Pain often does not occur at work - No radiation to the legs - No associated urinary or bowel symptoms - occasionally stretches with relief.   Hypertension: - Medications: losartan  - Compliance: good - Checking BP at home: yes, 122-130/90s - denies orthostatic symptoms  Diabetes, Type 2 - Last A1c 5.7 08/2023 - Medications: mounjaro  - Compliance: good - Checking BG at home: no - Eye exam: due - Foot exam: UTD - Microalbumin: UTD - Statin: yes - Denies symptoms of hypoglycemia, polyuria, polydipsia, numbness extremities, foot ulcers/trauma   OBJECTIVE:   BP 119/73   Pulse 80   Ht 5' 5 (1.651 m)   Wt 162 lb 12.8 oz (73.8 kg)   LMP  (LMP Unknown)   SpO2 100%   BMI 27.09 kg/m   Gen: well appearing, in NAD Card: RRR Lungs: CTAB Ext: WWP, no edema Back: no midline spinal tenderness. No hypertonicity or overlying skin changes.   ASSESSMENT/PLAN:   Essential hypertension Doing well on current regimen with no orthostatic symptoms, no changes made today.  DM2 (diabetes mellitus, type 2) (HCC) A1c today. Make adjustments as necessary.  Back pain Likely muscular 2/2 positional/ergonomics. Recommend NSAIDs prn and stretching. F/u if no better.     F/u 3 months for chronic disease f/u.    Donald CHRISTELLA Lai, DO

## 2023-11-22 NOTE — Assessment & Plan Note (Signed)
 Doing well on current regimen with no orthostatic symptoms, no changes made today.

## 2023-11-22 NOTE — Assessment & Plan Note (Signed)
 Likely muscular 2/2 positional/ergonomics. Recommend NSAIDs prn and stretching. F/u if no better.

## 2023-11-22 NOTE — Assessment & Plan Note (Signed)
 A1c today. Make adjustments as necessary.

## 2023-11-25 ENCOUNTER — Ambulatory Visit: Admitting: Family Medicine

## 2023-12-09 ENCOUNTER — Encounter: Payer: Self-pay | Admitting: Family Medicine

## 2023-12-17 ENCOUNTER — Encounter: Payer: Self-pay | Admitting: Family Medicine

## 2023-12-17 DIAGNOSIS — C719 Malignant neoplasm of brain, unspecified: Secondary | ICD-10-CM

## 2023-12-17 DIAGNOSIS — G8929 Other chronic pain: Secondary | ICD-10-CM

## 2023-12-18 ENCOUNTER — Encounter: Payer: Self-pay | Admitting: Neurology

## 2023-12-20 ENCOUNTER — Other Ambulatory Visit (HOSPITAL_BASED_OUTPATIENT_CLINIC_OR_DEPARTMENT_OTHER)

## 2023-12-20 ENCOUNTER — Other Ambulatory Visit (HOSPITAL_COMMUNITY): Payer: Self-pay

## 2023-12-20 ENCOUNTER — Ambulatory Visit (HOSPITAL_BASED_OUTPATIENT_CLINIC_OR_DEPARTMENT_OTHER)
Admission: RE | Admit: 2023-12-20 | Discharge: 2023-12-20 | Disposition: A | Discharge status: Home / Self Care | Source: Ambulatory Visit | Attending: Family Medicine | Admitting: Family Medicine

## 2023-12-20 ENCOUNTER — Ambulatory Visit: Payer: Self-pay | Admitting: Family Medicine

## 2023-12-20 ENCOUNTER — Ambulatory Visit: Admitting: Family Medicine

## 2023-12-20 ENCOUNTER — Encounter: Payer: Self-pay | Admitting: Family Medicine

## 2023-12-20 VITALS — BP 126/83 | HR 75 | Ht 65.0 in | Wt 164.6 lb

## 2023-12-20 DIAGNOSIS — R269 Unspecified abnormalities of gait and mobility: Secondary | ICD-10-CM | POA: Insufficient documentation

## 2023-12-20 DIAGNOSIS — H539 Unspecified visual disturbance: Secondary | ICD-10-CM | POA: Insufficient documentation

## 2023-12-20 DIAGNOSIS — R519 Headache, unspecified: Secondary | ICD-10-CM

## 2023-12-20 DIAGNOSIS — Z85841 Personal history of malignant neoplasm of brain: Secondary | ICD-10-CM

## 2023-12-20 MED ORDER — KETOROLAC TROMETHAMINE 30 MG/ML IJ SOLN
30.0000 mg | Freq: Once | INTRAMUSCULAR | Status: AC
  Administered 2023-12-20: 30 mg via INTRAMUSCULAR

## 2023-12-20 MED ORDER — DEXAMETHASONE SODIUM PHOSPHATE 10 MG/ML IJ SOLN
10.0000 mg | Freq: Once | INTRAMUSCULAR | Status: AC
  Administered 2023-12-20: 10 mg via INTRAMUSCULAR

## 2023-12-20 MED ORDER — TOPIRAMATE 25 MG PO TABS
25.0000 mg | ORAL_TABLET | Freq: Every day | ORAL | 0 refills | Status: DC
  Filled 2023-12-20: qty 90, 90d supply, fill #0

## 2023-12-20 MED ORDER — GADOBUTROL 1 MMOL/ML IV SOLN
7.5000 mL | Freq: Once | INTRAVENOUS | Status: AC | PRN
  Administered 2023-12-20: 7.5 mL via INTRAVENOUS
  Filled 2023-12-20: qty 7.5

## 2023-12-20 NOTE — Patient Instructions (Signed)
 Migraine Headache A migraine headache is a very strong throbbing pain on one or both sides of your head. This type of headache can also cause other symptoms. It can last from 4 hours to 3 days. Talk with your doctor about what things may bring on (trigger) this condition. What are the causes? The exact cause of a migraine is not known. This condition may be brought on or caused by: Smoking. Medicines, such as: Medicine used to treat chest pain (nitroglycerin). Birth control pills. Estrogen. Some blood pressure medicines. Certain substances in some foods or drinks. Foods and drinks, such as: Cheese. Chocolate. Alcohol. Caffeine. Doing physical activity that is very hard. Other things that may trigger a migraine headache include: Periods. Pregnancy. Hunger. Stress. Getting too much or too little sleep. Weather changes. Feeling tired (fatigue). What increases the risk? Being 18-65 years old. Being female. Having a family history of migraine headaches. Being Caucasian. Having a mental health condition, such as being sad (depressed) or feeling worried or nervous (anxious). Being very overweight (obese). What are the signs or symptoms? A throbbing pain. This pain may: Happen in any area of the head, such as on one or both sides. Make it hard to do daily activities. Get worse with physical activity. Get worse around bright lights, loud noises, or smells. Other symptoms may include: Feeling like you may vomit (nauseous). Vomiting. Dizziness. Before a migraine headache starts, you may get warning signs (an aura). An aura may include: Seeing flashing lights or having blind spots. Seeing bright spots, halos, or zigzag lines. Having tunnel vision or blurred vision. Having numbness or a tingling feeling. Having trouble talking. Having weak muscles. After a migraine ends, you may have symptoms. These may include: Tiredness. Trouble thinking (concentrating). How is this  treated? Taking medicines that: Relieve pain. Relieve the feeling like you may vomit. Prevent migraine headaches. Treatment may also include: Acupuncture. Lifestyle changes like avoiding foods that bring on migraine headaches. Learning ways to control your body functions (biofeedback). Therapy to help you know and deal with negative thoughts (cognitive behavioral therapy). Follow these instructions at home: Medicines Take over-the-counter and prescription medicines only as told by your doctor. If told, take steps to prevent problems with pooping (constipation). You may need to: Drink enough fluid to keep your pee (urine) pale yellow. Take medicines. You will be told what medicines to take. Eat foods that are high in fiber. These include beans, whole grains, and fresh fruits and vegetables. Limit foods that are high in fat and sugar. These include fried or sweet foods. Ask your doctor if you should avoid driving or using machines while you are taking your medicine. Lifestyle  Do not drink alcohol. Do not smoke or use any products that contain nicotine or tobacco. If you need help quitting, ask your doctor. Get 7-9 hours of sleep each night, or the amount recommended by your doctor. Find ways to deal with stress, such as meditation, deep breathing, or yoga. Try to exercise often. This can help lessen how bad and how often your migraines happen. General instructions Keep a journal to find out what may bring on your migraine headaches. This can help you avoid those things. For example, write down: What you eat and drink. How much sleep you get. Any change to your medicines or diet. If you have a migraine headache: Avoid things that make your symptoms worse, such as bright lights. Lie down in a dark, quiet room. Do not drive or use machinery. Ask your  doctor what activities are safe for you. Where to find more information Coalition for Headache and Migraine Patients (CHAMP):  headachemigraine.org American Migraine Foundation: americanmigrainefoundation.org National Headache Foundation: headaches.org Contact a doctor if: You get a migraine headache that is different or worse than others you have had. You have more than 15 days of headaches in one month. Get help right away if: Your migraine headache gets very bad. Your migraine headache lasts more than 72 hours. You have a fever or stiff neck. You have trouble seeing. Your muscles feel weak or like you cannot control them. You lose your balance a lot. You have trouble walking. You faint. You have a seizure. This information is not intended to replace advice given to you by your health care provider. Make sure you discuss any questions you have with your health care provider. Document Revised: 10/30/2021 Document Reviewed: 10/30/2021 Elsevier Patient Education  2024 [employer].

## 2023-12-20 NOTE — Assessment & Plan Note (Signed)
??   related to migraine headache vs vertigo as she felt dizzy when trying to lay down on the exam table R/O CVA -ED eval recommended, but she declined - She will go to the ED if symptoms worsen

## 2023-12-20 NOTE — Progress Notes (Addendum)
 SUBJECTIVE:   CHIEF COMPLAINT / HPI:   Discussed the use of AI scribe software for clinical note transcription with the patient, who gave verbal consent to proceed.  History of Present Illness   [name] is a 63 year old female who presents with headaches, nausea, and balance issues.  Headaches have persisted for the past three weeks, beginning shortly after a flu shot on September 5th or 6th. They are constant, slightly relieved by Excedrin migraine, but unresponsive to Tylenol . She reports blurry vision, especially during computer use, and a sensation of heavy eyes.  Nausea started approximately two weeks ago, about a week after the headaches began. It is persistent, though there is no vomiting.  Vision changes, including blurriness, began last week and occur both at work and at home, particularly when using a computer.  She has felt off balance for several months, with a tendency to veer to the right. This is accompanied by difficulty articulating words, which her husband has also noticed. She is concerned due to her family history of dementia, as both parents had the condition.  Two weeks ago, she found dried blood in her ear, initially attributing it to sinus drainage. She denies ear picking.  Current medications include estradiol  patch twice weekly, Zetia  10 mg, Xyzal  5 mg twice daily, losartan  25 mg, Protonix  40 mg daily, Crestor  40 mg daily, and Mounjaro  7.5 mg weekly. Gabapentin  is used as needed, and Robaxin  is kept on hand for occasional use.     Xyzal  BID prescribed by her allergist. I advised her that this is an atypical dosing and she stated she actually takes Xyzal  daily and not BID.   PERTINENT  PMH / PSH: PMHx reviewed  OBJECTIVE:   BP 126/83   Pulse 75   Ht 5' 5 (1.651 m)   Wt 164 lb 9.6 oz (74.7 kg)   LMP  (LMP Unknown)   SpO2 100%   BMI 27.39 kg/m    Physical Exam   GEN: Well appearing, no distress HEENT: Left ear normal, no blood  or sediment. CARDIOVASCULAR: Heart sounds normal, no murmurs. LUNG: Air entry equal B/L. No wheezing.  NEUROLOGICAL: Motor strength 5/5 in upper extremities. Deep tendon reflexes normal bilaterally. Facial symmetry intact. Gait unsteady, veering to the right. No sensory loss      ASSESSMENT/PLAN:   Assessment & Plan Nonintractable headache, unspecified chronicity pattern, unspecified headache type Chronic headache with recent worsening, associated vertigo, visual disturbance, and balance disturbance Chronic headache with recent exacerbation, associated with nausea, visual disturbances, and balance issues. Differential includes migraine, intracranial pathology, or stroke. Previous MRI showed no lesion, but history of ependymoma noted. Neurological exam otherwise normal.  - Patient advised to go to the ED today for a thorough evaluation including brain MRI. However, she declined and opted to wait for scheduled MRI unless symptoms worsen. - Order MRI of the brain as soon as possible. - Administer Toradol  30 mg injection for headache during this visit. - Administer Decadron  10 mg injection for headache during this visit. - Prescribe ibuprofen  400 mg as needed. - Recommend magnesium  oxide 400 mg over the counter as needed. - Prescribe Topamax  for migraine prophylaxis. - Failed eye exam today with eyeglasses.  - I advised her to call ophthalmology office today. However, she requested a new referral instead. Referred to ophthalmologist for vision changes. - Advise to go to the emergency department if symptoms worsen. - She agreed with the plan  Gait abnormality ?? related  to migraine headache vs vertigo as she felt dizzy when trying to lay down on the exam table R/O CVA -ED eval recommended, but she declined - She will go to the ED if symptoms worsen Vision changes Likely related to headache or worsening vision  Failed eye exam today with eyeglasses.  - I advised her to call ophthalmology  office today. However, she requested a new referral instead. Referred to ophthalmologist for vision changes. - Advise to go to the emergency department if symptoms worsen. 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 Current symptoms necessitate further imaging to rule out recurrence or other intracranial pathology. - Order MRI of the brain as soon as possible.    Hx of blood in her left ear. Completely normal ear exam B/L ?? Traumatic Monitor closely for now.    Otto Fairly, MD [facility]

## 2023-12-20 NOTE — Assessment & Plan Note (Addendum)
 Previous MRI showed no lesion. Current symptoms necessitate further imaging to rule out recurrence or other intracranial pathology. - Order MRI of the brain as soon as possible.

## 2023-12-20 NOTE — Progress Notes (Signed)
 Patient was scheduled for MRI today at [facility].

## 2023-12-30 ENCOUNTER — Other Ambulatory Visit: Payer: Self-pay | Admitting: Obstetrics and Gynecology

## 2023-12-30 DIAGNOSIS — Z1231 Encounter for screening mammogram for malignant neoplasm of breast: Secondary | ICD-10-CM

## 2023-12-31 ENCOUNTER — Encounter: Payer: Self-pay | Admitting: Family Medicine

## 2023-12-31 NOTE — Telephone Encounter (Signed)
 Called patient to discuss symptoms.   She reports she saw PCP on 10/3 for balance issues, headaches, blurred vision and nausea. She reports she was sent for a MRI and was referred to Neurology.   She reports her new patient apt is not until February 2026.   She reports she would like to see PCP soon to discuss dementia. She reports she feels a lot can happen between now and February.   She denies any worsening symptoms, however reports symptoms have been persistent.   She reports she is finding it harder and harder to find words sometimes and reports her husband has noted this as well.   She denies any shortness of breath, chest pains or facial changes.   Patient scheduled for 10/31 with PCP.  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   Strict ED precautions discussed with patient.

## 2024-01-02 ENCOUNTER — Other Ambulatory Visit: Payer: Self-pay | Admitting: Family Medicine

## 2024-01-02 ENCOUNTER — Other Ambulatory Visit (HOSPITAL_COMMUNITY): Payer: Self-pay

## 2024-01-02 ENCOUNTER — Other Ambulatory Visit: Payer: Self-pay

## 2024-01-02 MED ORDER — MOUNJARO 7.5 MG/0.5ML ~~LOC~~ SOAJ
7.5000 mg | SUBCUTANEOUS | 1 refills | Status: AC
  Filled 2024-01-02: qty 2, 28d supply, fill #0
  Filled 2024-03-31: qty 2, 28d supply, fill #1
  Filled ????-??-??: fill #1

## 2024-01-13 ENCOUNTER — Other Ambulatory Visit: Payer: Self-pay

## 2024-01-17 ENCOUNTER — Ambulatory Visit: Admitting: Family Medicine

## 2024-01-17 VITALS — BP 103/77 | HR 85 | Ht 65.0 in | Wt 160.4 lb

## 2024-01-17 DIAGNOSIS — R413 Other amnesia: Secondary | ICD-10-CM

## 2024-01-17 DIAGNOSIS — Z114 Encounter for screening for human immunodeficiency virus [HIV]: Secondary | ICD-10-CM | POA: Diagnosis not present

## 2024-01-17 DIAGNOSIS — Z113 Encounter for screening for infections with a predominantly sexual mode of transmission: Secondary | ICD-10-CM | POA: Diagnosis not present

## 2024-01-17 DIAGNOSIS — R269 Unspecified abnormalities of gait and mobility: Secondary | ICD-10-CM | POA: Diagnosis not present

## 2024-01-17 DIAGNOSIS — G43809 Other migraine, not intractable, without status migrainosus: Secondary | ICD-10-CM

## 2024-01-17 NOTE — Patient Instructions (Addendum)
  Nice seeing you. We have ordered some lab today. Your MRI reviewed with no acute findings. A few small foci of T2 hyperintensity in the cerebral white matter seen by be normal aging or due to migraine. Continue Ibuprofen  as needed for headache.  Memory Compensation Strategies  Use WARM strategy.  W= write it down  A= associate it  R= repeat it  M= make a mental note  2.   You can keep a Glass Blower/designer.  Use a 3-ring notebook with sections for the following: calendar, important names and phone numbers,  medications, doctors' names/phone numbers, lists/reminders, and a section to journal what you did  each day.   3.    Use a calendar to write appointments down.  4.    Write yourself a schedule for the day.  This can be placed on the calendar or in a separate section of the Memory Notebook.  Keeping a  regular schedule can help memory.  5.    Use medication organizer with sections for each day or morning/evening pills.  You may need help loading it  6.    Keep a basket, or pegboard by the door.  Place items that you need to take out with you in the basket or on the pegboard.  You may also want to  include a message board for reminders.  7.    Use sticky notes.  Place sticky notes with reminders in a place where the task is performed.  For example:  turn off the  stove placed by the stove, lock the door placed on the door at eye level,  take your medications on  the bathroom mirror or by the place where you normally take your medications.  8.    Use alarms/timers.  Use while cooking to remind yourself to check on food or as a reminder to take your medicine, or as a  reminder to make a call, or as a reminder to perform another task, etc.

## 2024-01-17 NOTE — Progress Notes (Signed)
    SUBJECTIVE:   CHIEF COMPLAINT / HPI:   Discussed the use of AI scribe software for clinical note transcription with the patient, who gave verbal consent to proceed.  History of Present Illness   [name], [name], is a 63 year old female who presents with persistent headaches, memory issues, and balance concerns.  She experiences persistent headaches that are less frequent than before but still occur, lasting two to three days when they do happen. The headaches are described as sometimes dull and sometimes piercing, primarily located in the frontal region. She reports that even when taking medication, her headaches can last for days before resolving. Uses Ibuprofen  and Magnesium  oxide as needed for headaches  She has episodes of blurry vision, slurred speech, and 'brain fog,' leading to difficulty remembering recent events and conversations. She forgot a conversation with her husband about a phone call, which he insists he had told her about. She frequently misplaces items and struggles to recall their location, needing to backtrack to find them.  She mentions balance issues, noting that she often cuts across in front of her coworker while walking and feels off balance. Her husband has also commented on her tendency to veer off to the side while walking together. No recent falls at home are reported.  Her current medication includes Mounjaro  7.5 mg weekly, with no changes reported in her medication regimen. She previously received B12 shots from an endocrinologist, but no longer sees that provider.       PERTINENT  PMH / PSH: PMHx reviewed  OBJECTIVE:   BP 103/77   Pulse 85   Ht 5' 5 (1.651 m)   Wt 160 lb 6 oz (72.7 kg)   LMP  (LMP Unknown)   SpO2 100%   BMI 26.69 kg/m   Physical Exam   CHEST: Lungs clear to auscultation bilaterally, no wheezing. CARDIOVASCULAR: Heart regular rhythm, no murmur. ABDOMEN: Normal bowel sounds, no distention, no  tenderness. EXTREMITIES: No leg swelling. NEUROLOGICAL: Sensation intact in upper and lower extremities. Good strength in extremities. Normal biceps reflexes bilaterally. Gait normal. Mini cognitive exam normal.      Mini-Cog - 01/17/24 1423     Normal clock drawing test? yes    How many words correct? 3           ASSESSMENT/PLAN:   Assessment & Plan Other migraine without status migrainosus, not intractable Chronic headaches - Likely Migraine headache Headaches are less frequent but last 2-3 days whenever they occur, dull and sometimes piercing, frontal location. MRI negative for significant abnormalities. Differential includes sinus-related issues due to weather changes. - Review current medications for potential side effects. - Continue with neurologist appointment on November 5th. Memory impairment Subjective memory impairment and brain fog Episodes of forgetting recent conversations and misplacing items. Mini cognitive exam is normal. MRI showed no significant abnormalities. Differential includes metabolic causes such as thyroid  dysfunction, vitamin B12 deficiency, and syphilis. - Order blood tests including thyroid  function, HIV, syphilis, vitamin B12, and thiamine levels. - Review MRI results with neurology during the upcoming appointment. Impaired gait Normal neuro exam Reports of veering while walking. Normal strength and reflexes. Differential diagnoses include metabolic causes such as vitamin B12 and thiamine deficiency. - Order blood tests, including vitamin B12 and thiamine levels. - Continue with neurologist appointment on November 5th.     Otto Fairly, MD [facility]

## 2024-01-17 NOTE — Assessment & Plan Note (Signed)
 Subjective memory impairment and brain fog Episodes of forgetting recent conversations and misplacing items. Mini cognitive exam is normal. MRI showed no significant abnormalities. Differential includes metabolic causes such as thyroid  dysfunction, vitamin B12 deficiency, and syphilis. - Order blood tests including thyroid  function, HIV, syphilis, vitamin B12, and thiamine levels. - Review MRI results with neurology during the upcoming appointment.

## 2024-01-17 NOTE — Assessment & Plan Note (Addendum)
 Chronic headaches - Likely Migraine headache Headaches are less frequent but last 2-3 days whenever they occur, dull and sometimes piercing, frontal location. MRI negative for significant abnormalities. Differential includes sinus-related issues due to weather changes. - Review current medications for potential side effects. - Continue with neurologist appointment on November 5th.

## 2024-01-17 NOTE — Assessment & Plan Note (Signed)
 Normal neuro exam Reports of veering while walking. Normal strength and reflexes. Differential diagnoses include metabolic causes such as vitamin B12 and thiamine deficiency. - Order blood tests, including vitamin B12 and thiamine levels. - Continue with neurologist appointment on November 5th.

## 2024-01-18 ENCOUNTER — Ambulatory Visit: Payer: Self-pay | Admitting: Family Medicine

## 2024-01-18 LAB — TSH RFX ON ABNORMAL TO FREE T4: TSH: 0.503 u[IU]/mL (ref 0.450–4.500)

## 2024-01-21 NOTE — Progress Notes (Unsigned)
 NEUROLOGY CONSULTATION NOTE  [name] MRN: [id number] DOB: Mar 28, 1960  Referring provider: Otto ONEIDA Fairly, MD Primary care provider: Otto ONEIDA Fairly, MD  Reason for consult:  migraine  Assessment/Plan:   Migraine without aura, without status migrainosus, not intractable Memory difficulty - with family history, cannot rule out amnestic MCI but would consider other possibilities.  Although still in normal range, B12 levels less than 400 may cause B12 deficiency symptoms such as cognitive changes.    For migraine: As she is already experiencing memory problems, will discontinue topiramate  (it was ineffective anyways) and plan to start Emgality For acute therapy, will have her try samples of Ubrelvy 100mg  and will prescribe Zofran  for nausea.  She will update me on efficacy of Ubrelvy Lifestyle modification discussed Limit use of pain relievers to no more than 9 days out of the month to prevent risk of rebound or medication-overuse headache. Regarding memory: Start B12 1000mcg daily.  Repeat level a week prior to follow up Will have patient return for nursing visit to perform MoCA test.  Will repeat at follow up. Further recommendations at follow up. Follow up 6 onths.   Subjective:  [name] is a 63 year old female who presents for migraines.   HISTORY:  Migraines: She has history of migraines since her early 44s.  They typically are top/frontal headaches associated with nausea, photophobia and phonophobia, lasting 3 to 5 days and occurring every 3 to 6 months.   In July 2020, she began not feeling well.  She started having a moderate constant pressure and throbbing headache on the right side of her head as well as intermittent sharp stabbing pains in the right side of her head, radiating to the right ear.  The following month, she notes some left sided pain as well.  Some associated mild nausea.  She has no appetite.  She notes cloudy vision but no  double vision.  No dizziness, slurred speech, fever.     She was evaluated in the ED on 10/16/18.  CT head personally reviewed and was negative.  She was treated with headache cocktail (magnesium  sulfate 1gm, Decadron  10mg , Toradol  15mg , Benadryl  12.5mg , Compazine  10mg ).  Headache resolved but soon recurred.    She also has history of chronic neck pain with radiating pain into the right shoulder.  MRI of right shoulder with and without contrast on 12/24/2016 showed focal intrasubstance tear of the distal supraspinous tendon, mild subacromial/subdeltoid bursitis and mild cystic degenerative changes of the glenoid.  MRI of cervical spine from 10/12/17 showed asymmetric right facet degeneration at C3-4 (with foraminal impingement), C4-5 (with foraminal impingement) and C7-T1 as well as some disc degeneration at C5-6 and C6-7.  NCV-EMG of right upper extremity from 11/20/18 was normal.    She underwent ACDF 5-6 and 6-7 in February 2021 and headaches largely resolved but she may have an occasional headache.  Around August 2025, headaches started to return.  Described as a throbbing (sometimes sharp) pain at the anterior vertex and bilateral frontal regions, usually 7-8/10 but sometimes 12 and associated with fuzzy vision, nausea, photophobia and phonophobia.  Lasting anywhere from 2-3 hours (with Tylenol ) to 2-3 days.  She has a headache about 2 to 3 days a week.  She was previously on topiramate  several years ago and was restarted on it about a week or two ago.    Over the past year, she has had some short term memory difficulty.  This may include word-finding difficulty or repeating  questions after just a couple of hours.  One time, she was driving to work in the morning and passed her work.  On the way home, she passed the street she needed to turn on to go home.  This isn't typical but she is concerned because both of her parents had dementia.  These cognitive difficulties seem worse while she is having a  headache.  Labs from 01/17/2024 revealed B12 310, TSH 0.503, nonreactive RPR and HIV.   She has had prior imaging to evaluate headache.  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  Repeat MRI of brain with and without contrast and MRA of head from 12/05/18 were were normal. Due to ongoing headaches, she has had MRI of brain with and without contrast on 02/08/2023 and 12/20/2023 which were both unremarkable with stable subependymoma.  Chronic neck pain with radiculopathy:   She had fusion in Feb 2021 and headaches imiproved . Come back 3 months.   Headaches frontal anterior top 7-8/10 and sometimes 12, throbbing and sometimes stabbing/piercing N, photo, phono (not as much) fuzzy vision All day to days (2-3 hours if taken early - ibuprofen  or tylenol  2-3 days a week Memory worse during headache One morning away to work and passed his job and turned around - on way home passed street of her home Restarted a couple of weeks ago topiramate  B12 310  Struggling to form sentences and forgetful Veering to right    Current medications: Current NSAIDS:  none Current analgesics:  none Current triptans:  none Current ergotamine:  nnone Current anti-emetic:  none Current muscle relaxants:  methocarbamol  500mg -1000mg  PRN Current anti-anxiolytic:  none Current sleep aide:  none Current Antihypertensive medications:  losartan  25mg  daily Current Antidepressant medications:  none Current Anticonvulsant medications:  topiramate  25mg  daily (previously on 50mg  BID, ineffective), gabapentin  100mg  twice daily PRN (for burning in the feet, makes her feel funny) Current anti-CGRP:  none Current Vitamins/Herbal/Supplements:  Magnesium  oxide Current Antihistamines/Decongestants:  Xyzal  Other therapy:  none  Past medications: Past NSAIDS:  Indomethacin, naproxen , ibuprofen  Past  steroids:  prednisone  Past analgesics:  Tylenol , Tylenol  #3, Fioricet , Midrin Past abortive triptans:  Axert , Imitrex Past abortive ergotamine:  none Past muscle relaxants:  Flexeril  Past anti-emetic:  none Past antihypertensive medications:  none Past antidepressant medications:  amitriptyline , venlafaxine  Past anticonvulsant medications:  zonisamide 25mg , lamotrigine Past anti-CGRP:  none Past vitamins/Herbal/Supplements:  none Past antihistamines/decongestants:  Astelin  NS Other past therapies:  none  PAST MEDICAL HISTORY: Past Medical History:  Diagnosis Date   Anxiety    Arthritis    neck , right knee > than left   Brain tumor (benign) (HCC) 2018   Chest pain 05/15/2015   Complication of anesthesia    COVID-19 virus infection 10/15/2020   Depression    in the past   Diabetes mellitus without complication (HCC)    Type II   Dizziness 04/26/2020   GERD (gastroesophageal reflux disease)    04/30/2019- in the past   H/O varicella    History of measles, mumps, or rubella    HSV-2 (herpes simplex virus 2) infection    Hx of adenomatous polyp of colon 05/15/2022   8 mm adenoma recall 2031   Hx of ovarian cyst    Hyperlipidemia    Hypersomnia 07/19/2015   Hypertension    sometimes    Knee pain, left 03/04/2020  Left shoulder pain 08/29/2016   Migraine headache    due to cervical issuses   Pain in joint of right shoulder 08/25/2018   Persistent fatigue after COVID-19    Plantar fasciitis    PONV (postoperative nausea and vomiting)    Scapular dyskinesis 10/03/2016   Sleep apnea    pt denies   Sleep deprivation    loss of sleep   VAIN I (vaginal intraepithelial neoplasia grade I) 2013   pap and confirmed by colposcopic biopsy    PAST SURGICAL HISTORY: Past Surgical History:  Procedure Laterality Date   ABDOMINAL HYSTERECTOMY     ANTERIOR CERVICAL DECOMP/DISCECTOMY FUSION N/A 05/07/2019   Procedure: ANTERIOR CERVICAL DECOMPRESSION FUSION CERVICAL 5-6,  CERVICAL 6-7 WITH INSTRUMENTATION AND ALLOGRAFT;  Surgeon: Beuford Anes, MD;  Location: [facility] OR;  Service: Orthopedics;  Laterality: N/A;   CHOLECYSTECTOMY  11/13/10   COLONOSCOPY  Multiple   DILATION AND CURETTAGE OF UTERUS     ESOPHAGOGASTRODUODENOSCOPY     OOPHORECTOMY  2009   bilateral salpingo-oophorectomy.   SHOULDER ARTHROSCOPY WITH ROTATOR CUFF REPAIR Right 08/03/2019   Procedure: SHOULDER ARTHROSCOPY WITH. ROTATOR CUFF DEBRIDEMENT, SUBACROMIAL DECOMPRESSION, DISTAL CLAVICLE EXCISION;  Surgeon: Dozier Soulier, MD;  Location: [facility];  Service: Orthopedics;  Laterality: Right;   TUBAL LIGATION      MEDICATIONS: Current Outpatient Medications on File Prior to Visit  Medication Sig Dispense Refill   Azelastine -Fluticasone  137-50 MCG/ACT SUSP Place 1 spray into both nostrils 2 (two) times daily as needed. (Patient not taking: Reported on 08/23/2023) 23 g 1   chlorhexidine  (PERIDEX ) 0.12 % solution Use as directed 15 mLs in the mouth or throat 2 (two) times daily. 473 mL 0   estradiol  (VIVELLE -DOT) 0.05 MG/24HR patch Place 1 patch (0.05 mg total) onto the skin 2 (two) times a week. 24 patch 3   ezetimibe  (ZETIA ) 10 MG tablet Take 1 tablet (10 mg total) by mouth daily. 90 tablet 1   gabapentin  (NEURONTIN ) 100 MG capsule Take 1 capsule (100 mg total) by mouth 3 (three) times daily. (Patient taking differently: Take 100 mg by mouth 3 (three) times daily as needed.) 90 capsule 1   ibuprofen  (ADVIL ) 600 MG tablet Take 1 tablet (600 mg total) by mouth every 12 (twelve) hours as needed for moderate pain (pain score 4-6). (Patient not taking: Reported on 08/23/2023) 60 tablet 1   levocetirizine (XYZAL ) 5 MG tablet Take 1 tablet (5 mg total) by mouth in the morning and at bedtime. (Patient taking differently: Take 5 mg by mouth daily.) 60 tablet 5   losartan  (COZAAR ) 25 MG tablet Take 1 tablet (25 mg total) by mouth at bedtime. 90 tablet 1   methocarbamol  (ROBAXIN ) 500 MG tablet Take  1-2 tablets (500-1,000 mg total) by mouth every 6 to 8 hours as needed for spasms and muscle tension. 60 tablet 2   montelukast  (SINGULAIR ) 10 MG tablet Take 1 tablet (10 mg total) by mouth at bedtime. 30 tablet 5   pantoprazole  (PROTONIX ) 40 MG tablet Take 1 tablet (40 mg total) by mouth daily before breakfast. 90 tablet 0   rosuvastatin  (CRESTOR ) 40 MG tablet Take 1 tablet (40 mg total) by mouth at bedtime. 90 tablet 1   tirzepatide  (MOUNJARO ) 7.5 MG/0.5ML Pen Inject 7.5 mg into the skin once a week. 2 mL 1   topiramate  (TOPAMAX ) 25 MG tablet Take 1 tablet (25 mg total) by mouth daily. 90 tablet 0   No current facility-administered medications on file prior to  visit.    ALLERGIES: Allergies  Allergen Reactions   Sulfa Antibiotics Anaphylaxis, Hives and Swelling    FAMILY HISTORY: Family History  Problem Relation Age of Onset   Hypertension Mother    Hypotension Mother    Anemia Mother        low iron   Breast cancer Sister 20   Heart disease Sister    Alcohol abuse Brother    Alcohol abuse Brother    Aneurysm Brother    Drug abuse Brother    Sickle cell trait Other    Colon cancer Neg Hx    Esophageal cancer Neg Hx    Stomach cancer Neg Hx     Objective:  Blood pressure 134/80, pulse 84, height 5' 5 (1.651 m), weight 160 lb (72.6 kg), SpO2 98%. General: No acute distress.  Patient appears well-groomed.   Head:  Normocephalic/atraumatic Eyes:  fundi examined but not visualized Neck: supple, no paraspinal tenderness, full range of motion Heart: regular rate and rhythm Neurological Exam: Mental status: alert and oriented to person, place, and time, speech fluent and not dysarthric, language intact. Cranial nerves: CN I: not tested CN II: pupils equal, round and reactive to light, visual fields intact CN III, IV, VI:  full range of motion, no nystagmus, no ptosis CN V: facial sensation intact. CN VII: upper and lower face symmetric CN VIII: hearing intact CN IX, X:  gag intact, uvula midline CN XI: sternocleidomastoid and trapezius muscles intact CN XII: tongue midline Bulk & Tone: normal, no fasciculations. Motor:  muscle strength 5/5 throughout Sensation:  Pinprick and vibratory sensation intact. Deep Tendon Reflexes:  2+ throughout,  toes downgoing.   Finger to nose testing:  Without dysmetria.   Gait:  Normal station and stride.  Romberg negative.    Thank you for allowing me to take part in the care of this patient.  Juliene Dunnings, DO  CC: Otto Fairly, MD

## 2024-01-22 ENCOUNTER — Other Ambulatory Visit: Payer: Self-pay

## 2024-01-22 ENCOUNTER — Ambulatory Visit: Admitting: Neurology

## 2024-01-22 ENCOUNTER — Encounter: Payer: Self-pay | Admitting: Neurology

## 2024-01-22 ENCOUNTER — Telehealth (HOSPITAL_COMMUNITY): Payer: Self-pay

## 2024-01-22 ENCOUNTER — Encounter (HOSPITAL_COMMUNITY): Payer: Self-pay

## 2024-01-22 ENCOUNTER — Other Ambulatory Visit (HOSPITAL_COMMUNITY): Payer: Self-pay

## 2024-01-22 VITALS — BP 134/80 | HR 84 | Ht 65.0 in | Wt 160.0 lb

## 2024-01-22 DIAGNOSIS — G43009 Migraine without aura, not intractable, without status migrainosus: Secondary | ICD-10-CM

## 2024-01-22 DIAGNOSIS — R413 Other amnesia: Secondary | ICD-10-CM

## 2024-01-22 MED ORDER — ONDANSETRON 4 MG PO TBDP
4.0000 mg | ORAL_TABLET | Freq: Three times a day (TID) | ORAL | 5 refills | Status: AC | PRN
  Filled 2024-01-22: qty 20, 7d supply, fill #0

## 2024-01-22 MED ORDER — EMGALITY 120 MG/ML ~~LOC~~ SOAJ
240.0000 mg | Freq: Once | SUBCUTANEOUS | 0 refills | Status: AC
  Filled 2024-01-22: qty 2, 30d supply, fill #0
  Filled 2024-01-27: qty 2, 56d supply, fill #0
  Filled 2024-01-28: qty 2, 28d supply, fill #0

## 2024-01-22 NOTE — Progress Notes (Signed)
 Medication Samples have been provided to the patient.  Drug name: [name]       Strength: 100 mg        Qty: 4  LOT: [id number]  Exp.Date: 4/28  Dosing instructions: as needed  The patient has been instructed regarding the correct time, dose, and frequency of taking this medication, including desired effects and most common side effects.   [name] 12:10 PM 01/22/2024

## 2024-01-22 NOTE — Patient Instructions (Addendum)
  STOP TOPIRAMATE .  Start Emgality - first dose is 2 injections then 1 injection every 28 days thereafter.  Once you pick up first dose (2 injections), send me a MyChart message and I will send in standing order..  Contact us  in 3 months with update and we can change medication if headaches still not controlled. Take Ubrelvy at earliest onset of headache.  May repeat dose once in 2 hours if needed.  Maximum 2 tablets in 24 hours.  Let me know if it works Take ondansetron  for nausea Limit use of pain relievers to no more than 9 days out of the month.  These medications include acetaminophen , NSAIDs (ibuprofen /Advil /Motrin , naproxen /Aleve , triptans (Imitrex/sumatriptan), Excedrin, and narcotics.  This will help reduce risk of rebound headaches. Be aware of common food triggers Routine exercise Stay adequately hydrated (aim for 64 oz water daily) Keep headache diary Maintain proper stress management Maintain proper sleep hygiene Do not skip meals Consider supplements:  magnesium  citrate 400mg  daily, riboflavin 400mg  daily, coenzyme Q10 100mg  three times daily.  For memory, start B12 1000mcg daily.  Repeat level in 6 months (a week before follow up)

## 2024-01-25 LAB — RPR: RPR Ser Ql: NONREACTIVE

## 2024-01-25 LAB — VITAMIN B1: Thiamine: 66.4 nmol/L — ABNORMAL LOW (ref 66.5–200.0)

## 2024-01-25 LAB — VITAMIN B12: Vitamin B-12: 310 pg/mL (ref 232–1245)

## 2024-01-25 LAB — HIV ANTIBODY (ROUTINE TESTING W REFLEX): HIV Screen 4th Generation wRfx: NONREACTIVE

## 2024-01-26 ENCOUNTER — Other Ambulatory Visit (HOSPITAL_COMMUNITY): Payer: Self-pay

## 2024-01-26 MED ORDER — THIAMINE HCL 100 MG PO TABS
50.0000 mg | ORAL_TABLET | Freq: Every day | ORAL | 0 refills | Status: DC
  Filled 2024-01-26: qty 15, 30d supply, fill #0

## 2024-01-27 ENCOUNTER — Telehealth: Payer: Self-pay | Admitting: Pharmacy Technician

## 2024-01-27 ENCOUNTER — Other Ambulatory Visit (HOSPITAL_COMMUNITY): Payer: Self-pay

## 2024-01-27 NOTE — Telephone Encounter (Signed)
 PA has been submitted, and telephone encounter has been created. Please see telephone encounter dated 11.10.25.

## 2024-01-27 NOTE — Telephone Encounter (Signed)
 Pharmacy Patient Advocate Encounter   Received notification from Pt Calls Messages that prior authorization for [facility] 120MG  is required/requested.   Insurance verification completed.   The patient is insured through [facility].   Per test claim: PA required; PA submitted to above mentioned insurance via Latent Key/confirmation #/EOC [id number] Status is pending

## 2024-01-27 NOTE — Telephone Encounter (Signed)
 Pharmacy Patient Advocate Encounter  Received notification from [employer] that Prior Authorization for [facility] 120MG  has been APPROVED from 11.10.25 to 12.10.25. Ran test claim, Copay is $35. This test claim was processed through [facility]- copay amounts may vary at other pharmacies due to pharmacy/plan contracts, or as the patient moves through the different stages of their insurance plan.   PA #/Case ID/Reference #: [id number]  MAINTENANCE DOSE APPROVED 12.3.25 TO 5.2.26 AUTH# [id number]

## 2024-01-28 ENCOUNTER — Other Ambulatory Visit (HOSPITAL_COMMUNITY): Payer: Self-pay

## 2024-01-31 ENCOUNTER — Encounter

## 2024-02-04 ENCOUNTER — Other Ambulatory Visit (HOSPITAL_COMMUNITY): Payer: Self-pay

## 2024-02-04 ENCOUNTER — Telehealth (HOSPITAL_COMMUNITY): Payer: Self-pay | Admitting: Pharmacy Technician

## 2024-02-04 ENCOUNTER — Other Ambulatory Visit: Payer: Self-pay | Admitting: Neurology

## 2024-02-04 ENCOUNTER — Encounter: Payer: Self-pay | Admitting: Neurology

## 2024-02-04 ENCOUNTER — Telehealth: Payer: Self-pay | Admitting: Neurology

## 2024-02-04 MED ORDER — UBRELVY 100 MG PO TABS
1.0000 | ORAL_TABLET | ORAL | 5 refills | Status: AC | PRN
  Filled 2024-02-04 – 2024-02-18 (×2): qty 16, 30d supply, fill #0
  Filled 2024-03-31 (×2): qty 16, 30d supply, fill #1
  Filled ????-??-??: fill #1

## 2024-02-04 NOTE — Telephone Encounter (Signed)
 Medication Samples have been provided to the patient.  Drug name: Holland       Strength: 100 mg        Qty: 4  LOT: [id number]  Exp.Date: 4/28  Dosing instructions: as needed  The patient has been instructed regarding the correct time, dose, and frequency of taking this medication, including desired effects and most common side effects.   [name] Sharper 2:43 PM 02/04/2024

## 2024-02-04 NOTE — Telephone Encounter (Signed)
 The cognitive assessment was normal

## 2024-02-05 ENCOUNTER — Other Ambulatory Visit (HOSPITAL_COMMUNITY): Payer: Self-pay

## 2024-02-05 ENCOUNTER — Telehealth: Payer: Self-pay | Admitting: Pharmacy Technician

## 2024-02-05 NOTE — Telephone Encounter (Signed)
 Pharmacy Patient Advocate Encounter   Received notification from Patient Advice Request messages that prior authorization for UBRELVY  100MG  is required/requested.   Insurance verification completed.   The patient is insured through [facility].   Per test claim: PA required; PA submitted to above mentioned insurance via Latent Key/confirmation #/EOC [id number] Status is pending

## 2024-02-05 NOTE — Telephone Encounter (Signed)
 PA has been submitted, and telephone encounter has been created. Please see telephone encounter dated 11.19.25.

## 2024-02-06 ENCOUNTER — Other Ambulatory Visit (HOSPITAL_COMMUNITY): Payer: Self-pay

## 2024-02-06 NOTE — Telephone Encounter (Signed)
 Pharmacy Patient Advocate Encounter  Received notification from [facility] that Prior Authorization for UBRELVY  100MG  has been APPROVED from 11.19.25 to 5.18.26. Ran test claim, Copay is $0. This test claim was processed through [facility]- copay amounts may vary at other pharmacies due to pharmacy/plan contracts, or as the patient moves through the different stages of their insurance plan.   PA #/Case ID/Reference #: [id number]

## 2024-02-18 ENCOUNTER — Ambulatory Visit: Admitting: Family Medicine

## 2024-02-18 ENCOUNTER — Other Ambulatory Visit (HOSPITAL_COMMUNITY): Payer: Self-pay

## 2024-02-21 ENCOUNTER — Inpatient Hospital Stay
Admission: RE | Admit: 2024-02-21 | Discharge: 2024-02-21 | Attending: Obstetrics and Gynecology | Admitting: Obstetrics and Gynecology

## 2024-02-21 ENCOUNTER — Encounter: Payer: Self-pay | Admitting: Family Medicine

## 2024-02-21 ENCOUNTER — Ambulatory Visit: Admitting: Family Medicine

## 2024-02-21 VITALS — BP 135/82 | HR 59 | Ht 65.0 in | Wt 176.4 lb

## 2024-02-21 DIAGNOSIS — G9389 Other specified disorders of brain: Secondary | ICD-10-CM | POA: Diagnosis not present

## 2024-02-21 DIAGNOSIS — R519 Headache, unspecified: Secondary | ICD-10-CM

## 2024-02-21 DIAGNOSIS — Z1231 Encounter for screening mammogram for malignant neoplasm of breast: Secondary | ICD-10-CM

## 2024-02-21 DIAGNOSIS — E569 Vitamin deficiency, unspecified: Secondary | ICD-10-CM | POA: Diagnosis not present

## 2024-02-21 DIAGNOSIS — Z85841 Personal history of malignant neoplasm of brain: Secondary | ICD-10-CM

## 2024-02-21 DIAGNOSIS — G939 Disorder of brain, unspecified: Secondary | ICD-10-CM

## 2024-02-21 DIAGNOSIS — G43809 Other migraine, not intractable, without status migrainosus: Secondary | ICD-10-CM | POA: Diagnosis not present

## 2024-02-21 NOTE — Assessment & Plan Note (Addendum)
 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 No acute concerns, no surgical intervention needed due to stability. - Discuss MRI findings with neurology for monitoring or intervention. - Consider annual MRI if recommended by neurology. - Evaluate neurosurgery referral if advised by neurology.

## 2024-02-21 NOTE — Assessment & Plan Note (Addendum)
 Chronic migraines for 20 years, responsive to Emgality  and Ubrelvy . Stable superficial vein dilation may contribute, but is not definitively linked. Current treatment is effective, but she and her husband are concerned about masking worsening conditions of dilated brain vessels. - Continue Emgality  and Ubrelvy  and discuss medication holiday with neurology. - Coordinate with neurology on potential tapering. - I advised her to review the brain MRI with neurology at her next visit to see if they make further recommendations - Consider referral to neurosurgery for a second opinion

## 2024-02-21 NOTE — Progress Notes (Signed)
 SUBJECTIVE:   CHIEF COMPLAINT / HPI:   Discussed the use of AI scribe software for clinical note transcription with the patient, who gave verbal consent to proceed.  History of Present Illness   [name], [name], is a 63 year old female who presents with her husband to discuss recent MRI results.  She has a history of migraine headaches for approximately 20 years, currently managed with Emgality , an injection every 28 days, and Ubrelvy . She reports that the medications are helping her headaches, and there have been no changes to her medication regimen since the last visit. No new symptoms have been reported.  In 2015, an MRI of the brain showed no abnormalities. However, a 2018 MRI revealed a subependymoma, a benign brain tumor at the base of the brain. Neurology at the time did not recommend any intervention. Subsequent MRIs in 2020 and 2024 did not show any lesions. A recent MRI in 2025 initially reported no significant abnormalities, but an addendum noted a stable mass, likely a dilated vessel. She is concerned about this.  She has been taking thiamine  supplements as prescribed, but there was a discrepancy with her vitamin B12 supplementation, which was recommended by neurology due to low normal levels. She recalls being given Zofran  instead at the pharmacy.       PERTINENT  PMH / PSH: pmhx reviewed  OBJECTIVE:   BP 135/82   Pulse (!) 59   Ht 5' 5 (1.651 m)   Wt 176 lb 6.4 oz (80 kg)   LMP  (LMP Unknown)   SpO2 100%   BMI 29.35 kg/m   Physical Exam   CHEST: Lungs clear to auscultation, no wheezing. CARDIOVASCULAR: Heart sounds normal, no murmurs. ABDOMEN: Normal bowel sounds, no abdominal distention, no tenderness. EXTREMITIES: No leg swelling. NEUROLOGICAL: Sensation intact, good strength in hands, bicep reflexes normal bilaterally.      Results   RADIOLOGY Brain MRI: A focal area of enhancement medial to the right cerebellar tonsil and posterior to the  obex is stable, measuring 7 x 3 mm on axial images. This is unchanged dating back to 02/09/2014. 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 This is felt to be unlikely given the association with other superficial veins and stability over 11 years. (2025) Brain MRI: No lesion visualized. Recommend comparison with prior imaging. (2024) Brain MRI: No mass visualized. (2020) Brain MRI: Subependymoma, benign brain tumor at the base of the brain. (2018) Brain MRI: No abnormalities detected. (2015)      ASSESSMENT/PLAN:   Assessment & Plan Other migraine without status migrainosus, not intractable Chronic migraines for 20 years, responsive to Emgality  and Ubrelvy . Stable superficial vein dilation may contribute, but is not definitively linked. Current treatment is effective, but she and her husband are concerned about masking worsening conditions of dilated brain vessels. - Continue Emgality  and Ubrelvy  and discuss medication holiday with neurology. - Coordinate with neurology on potential tapering. 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 No acute concerns, no surgical intervention needed due to stability. - Discuss MRI findings with neurology for monitoring or intervention. - Consider annual MRI if recommended by  neurology. - Evaluate neurosurgery referral if advised by neurology. Vitamin deficiency Managed with supplementation. Vitamin B12  levels are low, normal, and Thiamine  is moderately low, with no current supplementation. - Continue thiamine  and B12  supplementation OTC. - Reviewed neurology's recommendations on vitamin B12. - Can obtain Vit OTC     Otto Fairly, MD [facility]

## 2024-02-21 NOTE — Assessment & Plan Note (Addendum)
 Managed with supplementation. Vitamin B12  levels are low, normal, and Thiamine  is moderately low, with no current supplementation. - Continue thiamine  and B12 supplementation OTC. - Reviewed neurology's recommendations on vitamin B12. - Can obtain Vit OTC

## 2024-02-21 NOTE — Progress Notes (Signed)
 This encounter was created in error - please disregard.  This encounter was created in error - please disregard.

## 2024-02-21 NOTE — Patient Instructions (Signed)
 Migraine Headache A migraine headache is a very strong throbbing pain on one or both sides of your head. This type of headache can also cause other symptoms. It can last from 4 hours to 3 days. Talk with your doctor about what things may bring on (trigger) this condition. What are the causes? The exact cause of a migraine is not known. This condition may be brought on or caused by: Smoking. Medicines, such as: Medicine used to treat chest pain (nitroglycerin). Birth control pills. Estrogen. Some blood pressure medicines. Certain substances in some foods or drinks. Foods and drinks, such as: Cheese. Chocolate. Alcohol. Caffeine. Doing physical activity that is very hard. Other things that may trigger a migraine headache include: Periods. Pregnancy. Hunger. Stress. Getting too much or too little sleep. Weather changes. Feeling tired (fatigue). What increases the risk? Being 18-65 years old. Being female. Having a family history of migraine headaches. Being Caucasian. Having a mental health condition, such as being sad (depressed) or feeling worried or nervous (anxious). Being very overweight (obese). What are the signs or symptoms? A throbbing pain. This pain may: Happen in any area of the head, such as on one or both sides. Make it hard to do daily activities. Get worse with physical activity. Get worse around bright lights, loud noises, or smells. Other symptoms may include: Feeling like you may vomit (nauseous). Vomiting. Dizziness. Before a migraine headache starts, you may get warning signs (an aura). An aura may include: Seeing flashing lights or having blind spots. Seeing bright spots, halos, or zigzag lines. Having tunnel vision or blurred vision. Having numbness or a tingling feeling. Having trouble talking. Having weak muscles. After a migraine ends, you may have symptoms. These may include: Tiredness. Trouble thinking (concentrating). How is this  treated? Taking medicines that: Relieve pain. Relieve the feeling like you may vomit. Prevent migraine headaches. Treatment may also include: Acupuncture. Lifestyle changes like avoiding foods that bring on migraine headaches. Learning ways to control your body functions (biofeedback). Therapy to help you know and deal with negative thoughts (cognitive behavioral therapy). Follow these instructions at home: Medicines Take over-the-counter and prescription medicines only as told by your doctor. If told, take steps to prevent problems with pooping (constipation). You may need to: Drink enough fluid to keep your pee (urine) pale yellow. Take medicines. You will be told what medicines to take. Eat foods that are high in fiber. These include beans, whole grains, and fresh fruits and vegetables. Limit foods that are high in fat and sugar. These include fried or sweet foods. Ask your doctor if you should avoid driving or using machines while you are taking your medicine. Lifestyle  Do not drink alcohol. Do not smoke or use any products that contain nicotine or tobacco. If you need help quitting, ask your doctor. Get 7-9 hours of sleep each night, or the amount recommended by your doctor. Find ways to deal with stress, such as meditation, deep breathing, or yoga. Try to exercise often. This can help lessen how bad and how often your migraines happen. General instructions Keep a journal to find out what may bring on your migraine headaches. This can help you avoid those things. For example, write down: What you eat and drink. How much sleep you get. Any change to your medicines or diet. If you have a migraine headache: Avoid things that make your symptoms worse, such as bright lights. Lie down in a dark, quiet room. Do not drive or use machinery. Ask your  doctor what activities are safe for you. Where to find more information Coalition for Headache and Migraine Patients (CHAMP):  headachemigraine.org American Migraine Foundation: americanmigrainefoundation.org National Headache Foundation: headaches.org Contact a doctor if: You get a migraine headache that is different or worse than others you have had. You have more than 15 days of headaches in one month. Get help right away if: Your migraine headache gets very bad. Your migraine headache lasts more than 72 hours. You have a fever or stiff neck. You have trouble seeing. Your muscles feel weak or like you cannot control them. You lose your balance a lot. You have trouble walking. You faint. You have a seizure. This information is not intended to replace advice given to you by your health care provider. Make sure you discuss any questions you have with your health care provider. Document Revised: 10/30/2021 Document Reviewed: 10/30/2021 Elsevier Patient Education  2024 [employer].

## 2024-02-24 NOTE — Telephone Encounter (Signed)
 I discussed the information below with her. She already has her Thiamine . She will pick up B12. F/U appointment made for lab check. She wants to be referred to Neurosurg for her MRI report. Referral order placed.

## 2024-02-25 ENCOUNTER — Ambulatory Visit: Payer: 59 | Admitting: Obstetrics and Gynecology

## 2024-02-25 NOTE — Progress Notes (Unsigned)
 63 y.o. [id number] Married African American female here for annual exam.    PCP: Anders Otto DASEN, MD   No LMP recorded (lmp unknown). Patient has had a hysterectomy.           Sexually active: Yes.    The current method of family planning is status post hysterectomy.    Menopausal hormone therapy:  Vivelle   Exercising: {yes no:314532}  {types:19826} Smoker:  Former   OB History  Gravida Para Term Preterm AB Living  3 2 2  1 2   SAB IAB Ectopic Multiple Live Births      2    # Outcome Date GA Lbr Len/2nd Weight Sex Type Anes PTL Lv  3 AB     U    DEC  2 Term     U    LIV  1 Term     U    LIV     HEALTH MAINTENANCE: Last 2 paps:  12/31/19 neg HR HPV neg, 11/12/16 neg  History of abnormal Pap or positive HPV:  yes, LGSIL on pap and vaginal biopsy 2013.  Mammogram:   02/21/24 results now back yet  Colonoscopy:   06/13/22 - polyp - due in 7 years Bone Density:  n/a  Result  n/a   Immunization History  Administered Date(s) Administered   Influenza, Seasonal, Injecte, Preservative Fre 12/21/2022, 11/22/2023   Influenza,inj,Quad PF,6+ Mos 12/11/2016, 11/18/2018, 12/18/2019, 12/21/2020, 01/03/2022   Influenza-Unspecified 12/17/2013, 12/28/2015, 12/17/2017   PFIZER Comirnaty(Gray Top)Covid-19 Tri-Sucrose Vaccine 07/08/2020   PFIZER(Purple Top)SARS-COV-2 Vaccination 05/24/2019, 06/24/2019, 11/28/2019   PNEUMOCOCCAL CONJUGATE-20 12/21/2022   Pfizer(Comirnaty)Fall Seasonal Vaccine 12 years and older 12/21/2022   Pneumococcal Polysaccharide-23 04/02/2016   Td 04/11/2009   Tdap 03/04/2020   Zoster Recombinant(Shingrix ) 11/09/2021, 01/19/2022      reports that she quit smoking about 43 years ago. Her smoking use included cigarettes. She started smoking about 44 years ago. She has a 0.5 pack-year smoking history. She has never used smokeless tobacco. She reports current alcohol use of about 1.0 standard drink of alcohol per week. She reports that she does not use drugs.  Past Medical  History:  Diagnosis Date   Anxiety    Arthritis    neck , right knee > than left   Brain tumor (benign) (HCC) 2018   Chest pain 05/15/2015   Complication of anesthesia    COVID-19 virus infection 10/15/2020   Depression    in the past   Diabetes mellitus without complication (HCC)    Type II   Dizziness 04/26/2020   GERD (gastroesophageal reflux disease)    04/30/2019- in the past   H/O varicella    History of measles, mumps, or rubella    HSV-2 (herpes simplex virus 2) infection    Hx of adenomatous polyp of colon 05/15/2022   8 mm adenoma recall 2031   Hx of ovarian cyst    Hyperlipidemia    Hypersomnia 07/19/2015   Hypertension    sometimes    Knee pain, left 03/04/2020   Left shoulder pain 08/29/2016   Migraine headache    due to cervical issuses   Pain in joint of right shoulder 08/25/2018   Persistent fatigue after COVID-19    Plantar fasciitis    PONV (postoperative nausea and vomiting)    Scapular dyskinesis 10/03/2016   Sleep apnea    pt denies   Sleep deprivation    loss of sleep   VAIN I (vaginal intraepithelial neoplasia grade I) 2013  pap and confirmed by colposcopic biopsy    Past Surgical History:  Procedure Laterality Date   ABDOMINAL HYSTERECTOMY     ANTERIOR CERVICAL DECOMP/DISCECTOMY FUSION N/A 05/07/2019   Procedure: ANTERIOR CERVICAL DECOMPRESSION FUSION CERVICAL 5-6, CERVICAL 6-7 WITH INSTRUMENTATION AND ALLOGRAFT;  Surgeon: Beuford Anes, MD;  Location: [facility];  Service: Orthopedics;  Laterality: N/A;   CHOLECYSTECTOMY  11/13/10   COLONOSCOPY  Multiple   DILATION AND CURETTAGE OF UTERUS     ESOPHAGOGASTRODUODENOSCOPY     OOPHORECTOMY  2009   bilateral salpingo-oophorectomy.   SHOULDER ARTHROSCOPY WITH ROTATOR CUFF REPAIR Right 08/03/2019   Procedure: SHOULDER ARTHROSCOPY WITH. ROTATOR CUFF DEBRIDEMENT, SUBACROMIAL DECOMPRESSION, DISTAL CLAVICLE EXCISION;  Surgeon: Dozier Soulier, MD;  Location: [facility];  Service:  Orthopedics;  Laterality: Right;   TUBAL LIGATION      Current Outpatient Medications  Medication Sig Dispense Refill   Azelastine -Fluticasone  137-50 MCG/ACT SUSP Place 1 spray into both nostrils 2 (two) times daily as needed. 23 g 1   chlorhexidine  (PERIDEX ) 0.12 % solution Use as directed 15 mLs in the mouth or throat 2 (two) times daily. 473 mL 0   estradiol  (VIVELLE -DOT) 0.05 MG/24HR patch Place 1 patch (0.05 mg total) onto the skin 2 (two) times a week. 24 patch 3   ezetimibe  (ZETIA ) 10 MG tablet Take 1 tablet (10 mg total) by mouth daily. 90 tablet 1   gabapentin  (NEURONTIN ) 100 MG capsule Take 1 capsule (100 mg total) by mouth 3 (three) times daily. (Patient taking differently: Take 100 mg by mouth 3 (three) times daily as needed.) 90 capsule 1   ibuprofen  (ADVIL ) 600 MG tablet Take 1 tablet (600 mg total) by mouth every 12 (twelve) hours as needed for moderate pain (pain score 4-6). 60 tablet 1   levocetirizine (XYZAL ) 5 MG tablet Take 1 tablet (5 mg total) by mouth in the morning and at bedtime. 60 tablet 5   losartan  (COZAAR ) 25 MG tablet Take 1 tablet (25 mg total) by mouth at bedtime. 90 tablet 1   methocarbamol  (ROBAXIN ) 500 MG tablet Take 1-2 tablets (500-1,000 mg total) by mouth every 6 to 8 hours as needed for spasms and muscle tension. 60 tablet 2   montelukast  (SINGULAIR ) 10 MG tablet Take 1 tablet (10 mg total) by mouth at bedtime. 30 tablet 5   ondansetron  (ZOFRAN -ODT) 4 MG disintegrating tablet Take 1 tablet (4 mg total) by mouth every 8 (eight) hours as needed. 20 tablet 5   pantoprazole  (PROTONIX ) 40 MG tablet Take 1 tablet (40 mg total) by mouth daily before breakfast. 90 tablet 0   rosuvastatin  (CRESTOR ) 40 MG tablet Take 1 tablet (40 mg total) by mouth at bedtime. 90 tablet 1   thiamine  (VITAMIN B1) 100 MG tablet Take 1/2 tablet (50 mg total) by mouth daily. 15 tablet 0   tirzepatide  (MOUNJARO ) 7.5 MG/0.5ML Pen Inject 7.5 mg into the skin once a week. 2 mL 1    Ubrogepant  (UBRELVY ) 100 MG TABS Take 1 tablet (100 mg total) by mouth as needed. May repeat after 2 hours.  Maximum 2 tablets in 24 hours. 16 tablet 5   No current facility-administered medications for this visit.    ALLERGIES: Sulfa antibiotics  Family History  Problem Relation Age of Onset   Dementia Mother    Hypertension Mother    Hypotension Mother    Anemia Mother        low iron   Dementia Father    Breast cancer Sister  81   Heart disease Sister    Alcohol abuse Brother    Alcohol abuse Brother    Aneurysm Brother    Drug abuse Brother    Sickle cell trait Other    Migraines Niece    Brain cancer Niece    Multiple sclerosis Niece    Stroke Niece    Colon cancer Neg Hx    Esophageal cancer Neg Hx    Stomach cancer Neg Hx     Review of Systems  PHYSICAL EXAM:  LMP  (LMP Unknown)     General appearance: alert, cooperative and appears stated age Head: normocephalic, without obvious abnormality, atraumatic Neck: no adenopathy, supple, symmetrical, trachea midline and thyroid  normal to inspection and palpation Lungs: clear to auscultation bilaterally Breasts: normal appearance, no masses or tenderness, No nipple retraction or dimpling, No nipple discharge or bleeding, No axillary adenopathy Heart: regular rate and rhythm Abdomen: soft, non-tender; no masses, no organomegaly Extremities: extremities normal, atraumatic, no cyanosis or edema Skin: skin color, texture, turgor normal. No rashes or lesions Lymph nodes: cervical, supraclavicular, and axillary nodes normal. Neurologic: grossly normal  Pelvic: External genitalia:  no lesions              No abnormal inguinal nodes palpated.              Urethra:  normal appearing urethra with no masses, tenderness or lesions              Bartholins and Skenes: normal                 Vagina: normal appearing vagina with normal color and discharge, no lesions              Cervix: no lesions              Pap taken: {yes  no:314532} Bimanual Exam:  Uterus:  normal size, contour, position, consistency, mobility, non-tender              Adnexa: no mass, fullness, tenderness              Rectal exam: {yes no:314532}.  Confirms.              Anus:  normal sphincter tone, no lesions  Chaperone was present for exam:  {BSCHAPERONE:31226::Emily F, CMA}  ASSESSMENT: Well woman visit with gynecologic exam.  PHQ-2-9: ***  ***  PLAN: Mammogram screening discussed. Self breast awareness reviewed. Pap and HRV collected:  {yes no:314532} Guidelines for Calcium , Vitamin D , regular exercise program including cardiovascular and weight bearing exercise. Medication refills:  *** {LABS (Optional):23779} Follow up:  ***    Additional counseling given.  {yes [id number]. ***  total time was spent for this patient encounter, including preparation, face-to-face counseling with the patient, coordination of care, and documentation of the encounter in addition to doing the well woman visit with gynecologic exam.

## 2024-02-26 ENCOUNTER — Other Ambulatory Visit: Payer: Self-pay | Admitting: Family Medicine

## 2024-02-26 ENCOUNTER — Other Ambulatory Visit (HOSPITAL_COMMUNITY): Payer: Self-pay

## 2024-02-26 ENCOUNTER — Other Ambulatory Visit (HOSPITAL_COMMUNITY)
Admission: RE | Admit: 2024-02-26 | Discharge: 2024-02-26 | Disposition: A | Discharge status: Home / Self Care | Source: Ambulatory Visit | Attending: Obstetrics and Gynecology | Admitting: Obstetrics and Gynecology

## 2024-02-26 ENCOUNTER — Ambulatory Visit: Admitting: Obstetrics and Gynecology

## 2024-02-26 ENCOUNTER — Encounter: Payer: Self-pay | Admitting: Obstetrics and Gynecology

## 2024-02-26 VITALS — BP 124/84 | HR 85 | Ht 65.0 in | Wt 168.0 lb

## 2024-02-26 DIAGNOSIS — Z79818 Long term (current) use of other agents affecting estrogen receptors and estrogen levels: Secondary | ICD-10-CM

## 2024-02-26 DIAGNOSIS — Z1331 Encounter for screening for depression: Secondary | ICD-10-CM | POA: Diagnosis not present

## 2024-02-26 DIAGNOSIS — Z87411 Personal history of vaginal dysplasia: Secondary | ICD-10-CM | POA: Diagnosis not present

## 2024-02-26 DIAGNOSIS — Z5181 Encounter for therapeutic drug level monitoring: Secondary | ICD-10-CM | POA: Diagnosis not present

## 2024-02-26 DIAGNOSIS — Z1272 Encounter for screening for malignant neoplasm of vagina: Secondary | ICD-10-CM

## 2024-02-26 DIAGNOSIS — Z01419 Encounter for gynecological examination (general) (routine) without abnormal findings: Secondary | ICD-10-CM | POA: Diagnosis not present

## 2024-02-26 MED ORDER — ESTRADIOL 0.05 MG/24HR TD PTTW
1.0000 | MEDICATED_PATCH | TRANSDERMAL | 3 refills | Status: AC
  Filled 2024-02-26: qty 24, 84d supply, fill #0

## 2024-02-26 NOTE — Patient Instructions (Signed)

## 2024-02-27 ENCOUNTER — Other Ambulatory Visit (HOSPITAL_COMMUNITY): Payer: Self-pay

## 2024-02-27 MED ORDER — IBUPROFEN 400 MG PO TABS
400.0000 mg | ORAL_TABLET | Freq: Two times a day (BID) | ORAL | 1 refills | Status: AC | PRN
  Filled 2024-02-27: qty 60, 30d supply, fill #0

## 2024-02-28 ENCOUNTER — Encounter: Payer: Self-pay | Admitting: Neurosurgery

## 2024-02-28 ENCOUNTER — Ambulatory Visit: Admitting: Neurosurgery

## 2024-02-28 VITALS — BP 137/90 | HR 84 | Temp 97.9°F | Ht 65.0 in | Wt 166.8 lb

## 2024-02-28 DIAGNOSIS — R519 Headache, unspecified: Secondary | ICD-10-CM | POA: Diagnosis not present

## 2024-02-28 DIAGNOSIS — R9089 Other abnormal findings on diagnostic imaging of central nervous system: Secondary | ICD-10-CM

## 2024-02-28 DIAGNOSIS — D330 Benign neoplasm of brain, supratentorial: Secondary | ICD-10-CM

## 2024-02-28 NOTE — Progress Notes (Unsigned)
 Assessment : 63 year old female with a complex medical history including chronic migraines, hypertension, obstructive sleep apnea, GERD, T2DM, radiculopathy, hyperlipidemia, memory impairment, cervical neuralgia, tobacco use, and obesity presenting today for clarification of a diagnosis.  Currently [name] is taking gabapentin  100mg , Emgality  120 mg/mL, Ubrelvy  100mg , Ibuprofen  PRN. [name] is not on anticoagulaiton or antiplatelet therapy.   The patient reports headaches beginning in the early 2000s, progressively worsening over the years. In 2015 [name] had an MRI ordered by Dr.Eniola her PCP which reported a brian tumor. At the time the lesion was interpreted to be small and no surgical intervention was pursued. [name] was started on topiramate  for headache management.   From 2015-2020 [name] underwent multiple MRIs and trials of headache medications with persistent symptoms.   In February of 2021, [name] underwent cervical fusion performed by Dr.Dumonski with [facility] after her headaches were attributed to cervical radiculopathy. [name] reported improvement in headaches following the surgery until early 2025, when the headaches returned. [name] returned to Dr.Dumonski who felt that her cervical spine remained stable   Her PCP subsequently ordered an MRI in October 2025 which reportedly did not comment on a tumor but noted a dilated vein (see addendum for scan on 10/03 Lonni Necessary M.D. 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 This discrepancy caused confusion for both the patient and PCP.  Around October 2025, the patient began experiencing worsening confusion and memory issues. Dr.Jaffe her neurologist discontinued topiramate  and instead put her on Emgality  and Ubrelvy . Her PCP referred her to [facility] to determine whether the intracranial findings warrant neurosurgical evaluation or intervention.   Plan : I was able to go back and look at the MRIs back to 2018.  This  enhancement at the obex is unchanged in comparison to this.  There was some question whether [name] had a subependymoma as well.  I do not see any hyperintensity in the tonsils that would make me believe that [name] has a subependymoma.  There is nothing on the FLAIR sequences or on the T2 sequences that would make me believe that there is an intra-axial [brain] lesion.  I explained to her what a subependymoma is and told her that I do not believe that [name] is suffering from it.  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  This location is very common for vessels to enter the fourth ventricle and exit.  At this location, bilateral plica vessels as well as cerebellar veins are present and I do not believe that this is a varix but more likely this is a point where arteries and veins are present in 1 cluster and I do not believe that this is indicative of a vascular abnormality.  I went over the images with her and explained this in great detail to her.  I told her that if [name] was my wife, I would not recommend any surgery for this and I do not believe that this is related to her headaches.  If it was, then spine surgery in the past would not have improve her headaches.  [name] was in agreement with this and voiced understanding.  Given the fact that in the 10 years of follow-up we have not seen any change, I do not think that we need to do a short-term follow-up but I told her that if [name] is worried about it, then we can do an MRI of the brain with and without contrast in 5 years.  If before that anything happens, then obviously [name] is welcome to come and see me.    Social History   Socioeconomic History   Marital status: Married    Spouse name: Elspeth   Number of children: 2   Years of education: 14   Highest education level: Associate degree: academic program  Occupational History    Employer: [employer]  Tobacco Use   Smoking status: Former    Current packs/day: 0.00     Average packs/day: 0.5 packs/day for 1 year (0.5 ttl pk-yrs)    Types: Cigarettes    Start date: 60    Quit date: 1982    Years since quitting: 43.9   Smokeless tobacco: Never   Tobacco comments:    [name] smoked 1/2 pack per day for less than a week when [name] was 63 yrs old.  Vaping Use   Vaping status: Never Used  Substance and Sexual Activity   Alcohol use: Yes    Alcohol/week: 1.0 standard drink of alcohol    Types: 1 Glasses of wine per week   Drug use: No   Sexual activity: Yes    Partners: Male    Birth control/protection: Surgical    Comment: Hysterectomy  Other Topics Concern   Not on file  Social History Narrative   Patient lives at home with her husband Carolan). 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 Two children. Both boys      Patient is right-handed. [name] lives in a one level home. [name] drinks coffee occasionally. [name] does not regularly exercise.   Social Drivers of Health   Tobacco Use: Medium Risk (02/28/2024)   Patient History    Smoking Tobacco Use: Former    Smokeless Tobacco Use: Never    Passive Exposure: Not on file  Financial Resource Strain: Low Risk (01/17/2024)   Overall Financial Resource Strain (CARDIA)    Difficulty of Paying Living Expenses: Not hard at all  Food Insecurity: No Food Insecurity (01/17/2024)   Epic    Worried About Programme Researcher, Broadcasting/film/video in the Last Year: Never true    Ran Out of Food in the Last Year: Never true  Transportation Needs: No Transportation Needs (01/17/2024)   Epic    Lack of Transportation (Medical): No    Lack of Transportation (Non-Medical): No  Physical Activity: Insufficiently Active (01/17/2024)   Exercise Vital Sign    Days of Exercise per Week: 3 days    Minutes of Exercise per Session: 30 min  Stress: No Stress Concern Present (01/17/2024)   [employer] of Occupational Health - Occupational Stress Questionnaire     Feeling of Stress: Only a little  Social Connections: Socially Integrated (12/19/2023)   Social Connection and Isolation Panel    Frequency of Communication with Friends and Family: More than three times a week    Frequency of Social Gatherings with Friends and Family: Twice a week    Attends Religious Services: More than 4 times per year    Active Member of Clubs or Organizations: Yes    Attends Banker Meetings: More than 4 times per year    Marital Status: Married  Catering Manager Violence: Not on file  Depression (PHQ2-9): Low Risk (02/26/2024)   Depression (PHQ2-9)    PHQ-2 Score: 0  Alcohol Screen: Low Risk (01/17/2024)   Alcohol Screen    Last Alcohol Screening Score (AUDIT): 1  Housing: Low  Risk (01/17/2024)   Epic    Unable to Pay for Housing in the Last Year: No    Number of Times Moved in the Last Year: 0    Homeless in the Last Year: No  Utilities: Not on file  Health Literacy: Not on file    Family History  Problem Relation Age of Onset   Dementia Mother    Hypertension Mother    Hypotension Mother    Anemia Mother        low iron   Dementia Father    Breast cancer Sister 39   Heart disease Sister    Alcohol abuse Brother    Alcohol abuse Brother    Aneurysm Brother    Drug abuse Brother    Sickle cell trait Other    Migraines Niece    Brain cancer Niece    Multiple sclerosis Niece    Stroke Niece    Colon cancer Neg Hx    Esophageal cancer Neg Hx    Stomach cancer Neg Hx     Allergies[1]  Past Medical History:  Diagnosis Date   Anxiety    Arthritis    neck , right knee > than left   Brain tumor (benign) (HCC) 2018   Chest pain 05/15/2015   Complication of anesthesia    COVID-19 virus infection 10/15/2020   Depression    in the past   Diabetes mellitus without complication (HCC)    Type II   Dizziness 04/26/2020   GERD (gastroesophageal reflux disease)    04/30/2019- in the past   H/O varicella    History of measles,  mumps, or rubella    HSV-2 (herpes simplex virus 2) infection    Hx of adenomatous polyp of colon 05/15/2022   8 mm adenoma recall 2031   Hx of ovarian cyst    Hyperlipidemia    Hypersomnia 07/19/2015   Hypertension    sometimes    Knee pain, left 03/04/2020   Left shoulder pain 08/29/2016   Migraine headache    due to cervical issuses   Pain in joint of right shoulder 08/25/2018   Persistent fatigue after COVID-19    Plantar fasciitis    PONV (postoperative nausea and vomiting)    Scapular dyskinesis 10/03/2016   Sleep apnea    pt denies   Sleep deprivation    loss of sleep   VAIN I (vaginal intraepithelial neoplasia grade I) 2013   pap and confirmed by colposcopic biopsy    Past Surgical History:  Procedure Laterality Date   ABDOMINAL HYSTERECTOMY     ANTERIOR CERVICAL DECOMP/DISCECTOMY FUSION N/A 05/07/2019   Procedure: ANTERIOR CERVICAL DECOMPRESSION FUSION CERVICAL 5-6, CERVICAL 6-7 WITH INSTRUMENTATION AND ALLOGRAFT;  Surgeon: Beuford Anes, MD;  Location: [facility];  Service: Orthopedics;  Laterality: N/A;   CHOLECYSTECTOMY  11/13/10   COLONOSCOPY  Multiple   DILATION AND CURETTAGE OF UTERUS     ESOPHAGOGASTRODUODENOSCOPY     OOPHORECTOMY  2009   bilateral salpingo-oophorectomy.   SHOULDER ARTHROSCOPY WITH ROTATOR CUFF REPAIR Right 08/03/2019   Procedure: SHOULDER ARTHROSCOPY WITH. ROTATOR CUFF DEBRIDEMENT, SUBACROMIAL DECOMPRESSION, DISTAL CLAVICLE EXCISION;  Surgeon: Dozier Soulier, MD;  Location: [facility];  Service: Orthopedics;  Laterality: Right;   TUBAL LIGATION       Physical Exam   Physical Exam HENT:     Head: Normocephalic.     Nose: Nose normal.  Eyes:     Pupils: Pupils are equal, round, and reactive to light.  Cardiovascular:     Rate and Rhythm: Normal rate.  Pulmonary:     Effort: Pulmonary effort is normal.  Abdominal:     General: Abdomen is flat.  Musculoskeletal:     Cervical back: Normal range of motion.   Neurological:     Mental Status: Patient is alert.     Cranial Nerves: Cranial nerves 2-12 are intact.     Sensory: Sensation is intact.     Motor: Motor function is intact.     Coordination: Coordination is intact.     Results for orders placed or performed during the hospital encounter of 12/20/23  MR Brain W Wo Contrast   Addendum: 01/23/2024   ADDENDUM REPORT: 01/23/2024 15:09  ADDENDUM: A focal area of enhancement medial to the right cerebellar tonsil and posterior to the obex is stable, measuring 7 x 3 mm on axial images. This is unchanged dating back to 02/09/2014. 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 This is felt to be unlikely given association with other superficial veins and stability over 11 years.   Electronically Signed   By: Lonni Necessary M.D.   On: 01/23/2024 15:09     Narrative   CLINICAL DATA:  Headache and gait abnormality, visual disturbance  EXAM: MRI HEAD WITHOUT AND WITH CONTRAST  TECHNIQUE: Multiplanar, multiecho pulse sequences of the brain and surrounding structures were obtained without and with intravenous contrast.  CONTRAST:  7.5mL GADAVIST  GADOBUTROL  1 MMOL/ML IV SOLN  COMPARISON:  February 08, 2023  FINDINGS: MRI brain:  The brain volume is normal.  There are a few small foci of T2 hyperintensity in the cerebral white matter. These do not have restricted diffusion.  No abnormal enhancement.  There is no acute or chronic infarct.  The ventricles are normal.  No mass lesion.  There are normal flow signals in the carotid arteries and basilar artery.  No significant bone marrow signal abnormality.  No significant abnormality in the paranasal sinuses or soft tissues.  IMPRESSION: No significant abnormality. No change compared with the prior study from 02/08/2023  Electronically Signed: By:  Nancyann Burns M.D. On: 12/20/2023 16:14   Results for orders placed or performed during the hospital encounter of 06/23/22  CT Head Wo Contrast   Narrative   CLINICAL DATA:  Severe headache  EXAM: CT HEAD WITHOUT CONTRAST  TECHNIQUE: Contiguous axial images were obtained from the base of the skull through the vertex without intravenous contrast.  RADIATION DOSE REDUCTION: This exam was performed according to the departmental dose-optimization program which includes automated exposure control, adjustment of the mA and/or kV according to patient size and/or use of iterative reconstruction technique.  COMPARISON:  CT head 03/31/2021  FINDINGS: Brain: No intracranial hemorrhage, mass effect, or evidence of acute infarct. No hydrocephalus. No extra-axial fluid collection.  Vascular: No hyperdense vessel or unexpected calcification.  Skull: No fracture or focal lesion.  Sinuses/Orbits: No acute finding. Paranasal sinuses and mastoid air cells are well aerated.  Other: None.  IMPRESSION: No acute intracranial process.   Electronically Signed   By: Norman Gatlin M.D.   On: 06/23/2022 21:49   Results for orders placed or performed during the hospital encounter of 12/05/18  MR ANGIO HEAD WO CONTRAST   Narrative   CLINICAL DATA:  New onset headaches beginning about 3 months ago.  Creatinine was obtained on site at [facility] at [address]  Results: Creatinine 1.6 mg/dL.  EXAM: MRI HEAD WITHOUT CONTRAST  MRA HEAD WITHOUT CONTRAST  TECHNIQUE: Multiplanar, multiecho pulse sequences of the brain and surrounding structures were obtained without intravenous contrast. Angiographic images of the head were obtained using MRA technique without contrast.  COMPARISON:  CT 10/16/2018.  MRI 12/29/2016.  FINDINGS: MRI HEAD FINDINGS  Brain: Diffusion imaging does not show any acute or subacute infarction. The brain appears normal without evidence of old  or acute small or large vessel infarction, mass lesion, hemorrhage, hydrocephalus or extra-axial collection.  Vascular: Major vessels at the base of the brain show flow.  Skull and upper cervical spine: Negative  Sinuses/Orbits: Sinuses are clear.  No orbital pathology is seen.  Other: None  MRA HEAD FINDINGS  Both internal carotid arteries are widely patent into the brain. No siphon stenosis. The anterior and middle cerebral vessels are patent without proximal stenosis, aneurysm or vascular malformation.  Both vertebral arteries are widely patent to the basilar. No basilar stenosis. Posterior circulation branch vessels appear normal.  IMPRESSION: Normal MRI of the brain. Normal intracranial MR angiography. No abnormality seen to explain the clinical presentation.   Electronically Signed   By: Oneil Officer M.D.   On: 12/05/2018 12:47    *Note: Due to a large number of results and/or encounters for the requested time period, some results have not been displayed. A complete set of results can be found in Results Review.       [1]  Allergies Allergen Reactions   Sulfa Antibiotics Anaphylaxis, Hives and Swelling

## 2024-03-01 ENCOUNTER — Ambulatory Visit: Payer: Self-pay | Admitting: Obstetrics and Gynecology

## 2024-03-02 LAB — CYTOLOGY - PAP
Comment: NEGATIVE
Diagnosis: NEGATIVE
High risk HPV: NEGATIVE

## 2024-03-03 ENCOUNTER — Ambulatory Visit: Payer: Self-pay | Admitting: Obstetrics and Gynecology

## 2024-03-23 ENCOUNTER — Other Ambulatory Visit: Payer: Self-pay | Admitting: Neurology

## 2024-03-23 ENCOUNTER — Other Ambulatory Visit: Payer: Self-pay | Admitting: Family Medicine

## 2024-03-23 ENCOUNTER — Other Ambulatory Visit (HOSPITAL_COMMUNITY): Payer: Self-pay

## 2024-03-23 ENCOUNTER — Encounter: Payer: Self-pay | Admitting: Family Medicine

## 2024-03-23 ENCOUNTER — Other Ambulatory Visit: Payer: Self-pay

## 2024-03-23 ENCOUNTER — Other Ambulatory Visit: Payer: Self-pay | Admitting: Allergy

## 2024-03-23 MED ORDER — GABAPENTIN 100 MG PO CAPS
100.0000 mg | ORAL_CAPSULE | Freq: Three times a day (TID) | ORAL | 1 refills | Status: AC
  Filled 2024-03-31 – 2024-04-23 (×2): qty 90, 30d supply, fill #0
  Filled ????-??-??: fill #0

## 2024-03-23 MED ORDER — LOSARTAN POTASSIUM 25 MG PO TABS
25.0000 mg | ORAL_TABLET | Freq: Every day | ORAL | 1 refills | Status: AC
  Filled 2024-03-31: qty 90, 90d supply, fill #0
  Filled ????-??-??: fill #0

## 2024-03-23 MED ORDER — VITAMIN B-1 100 MG PO TABS
50.0000 mg | ORAL_TABLET | Freq: Every day | ORAL | 0 refills | Status: AC
  Filled 2024-03-31 (×3): qty 15, 30d supply, fill #0
  Filled ????-??-??: fill #0

## 2024-03-23 MED ORDER — ROSUVASTATIN CALCIUM 40 MG PO TABS
40.0000 mg | ORAL_TABLET | Freq: Every day | ORAL | 1 refills | Status: AC
  Filled 2024-03-31: qty 90, 90d supply, fill #0
  Filled ????-??-??: fill #0

## 2024-03-25 ENCOUNTER — Encounter (HOSPITAL_COMMUNITY): Payer: Self-pay

## 2024-03-25 ENCOUNTER — Other Ambulatory Visit (HOSPITAL_COMMUNITY): Payer: Self-pay

## 2024-03-30 ENCOUNTER — Ambulatory Visit: Admitting: Neurology

## 2024-03-31 ENCOUNTER — Encounter: Payer: Self-pay | Admitting: Pharmacist

## 2024-03-31 ENCOUNTER — Other Ambulatory Visit (HOSPITAL_COMMUNITY): Payer: Self-pay

## 2024-03-31 ENCOUNTER — Other Ambulatory Visit: Payer: Self-pay

## 2024-04-20 ENCOUNTER — Ambulatory Visit: Admitting: Neurology

## 2024-04-22 ENCOUNTER — Encounter: Payer: Self-pay | Admitting: Family Medicine

## 2024-04-24 ENCOUNTER — Ambulatory Visit: Admitting: Family Medicine

## 2024-04-28 ENCOUNTER — Ambulatory Visit: Admitting: Family Medicine

## 2024-07-17 ENCOUNTER — Other Ambulatory Visit

## 2024-07-22 ENCOUNTER — Ambulatory Visit: Admitting: Neurology

## 2025-03-04 ENCOUNTER — Ambulatory Visit: Admitting: Obstetrics and Gynecology
